# Patient Record
Sex: Female | Born: 1940 | Race: White | Hispanic: No | Marital: Married | Smoking: Former smoker
Health system: Southern US, Community
[De-identification: ages and names within clinical notes are randomized; demographics above are authoritative.]

## PROBLEM LIST (undated history)

## (undated) DIAGNOSIS — F419 Anxiety disorder, unspecified: Secondary | ICD-10-CM

## (undated) DIAGNOSIS — M810 Age-related osteoporosis without current pathological fracture: Secondary | ICD-10-CM

## (undated) DIAGNOSIS — K635 Polyp of colon: Secondary | ICD-10-CM

## (undated) DIAGNOSIS — C801 Malignant (primary) neoplasm, unspecified: Secondary | ICD-10-CM

## (undated) DIAGNOSIS — C73 Malignant neoplasm of thyroid gland: Secondary | ICD-10-CM

## (undated) DIAGNOSIS — C3490 Malignant neoplasm of unspecified part of unspecified bronchus or lung: Secondary | ICD-10-CM

## (undated) DIAGNOSIS — Z460 Encounter for fitting and adjustment of spectacles and contact lenses: Secondary | ICD-10-CM

## (undated) DIAGNOSIS — C50919 Malignant neoplasm of unspecified site of unspecified female breast: Secondary | ICD-10-CM

## (undated) HISTORY — PX: ABDOMINAL HYSTERECTOMY: SHX81

## (undated) HISTORY — DX: Malignant neoplasm of unspecified part of unspecified bronchus or lung: C34.90

## (undated) HISTORY — PX: TONSILLECTOMY AND ADENOIDECTOMY: SUR1326

## (undated) HISTORY — DX: Malignant (primary) neoplasm, unspecified: C80.1

## (undated) HISTORY — DX: Malignant neoplasm of thyroid gland: C73

## (undated) HISTORY — DX: Malignant neoplasm of unspecified site of unspecified female breast: C50.919

## (undated) HISTORY — DX: Encounter for fitting and adjustment of spectacles and contact lenses: Z46.0

## (undated) HISTORY — DX: Polyp of colon: K63.5

## (undated) HISTORY — DX: Age-related osteoporosis without current pathological fracture: M81.0

## (undated) HISTORY — PX: LUNG SURGERY: SHX703

## (undated) HISTORY — DX: Anxiety disorder, unspecified: F41.9

---

## 1969-07-31 HISTORY — PX: FOOT SURGERY: SHX648

## 1987-11-01 HISTORY — PX: ABDOMINAL SURGERY: SHX537

## 1997-10-31 DIAGNOSIS — C50919 Malignant neoplasm of unspecified site of unspecified female breast: Secondary | ICD-10-CM

## 1997-10-31 HISTORY — PX: BREAST SURGERY: SHX581

## 1997-10-31 HISTORY — DX: Malignant neoplasm of unspecified site of unspecified female breast: C50.919

## 1998-01-29 ENCOUNTER — Encounter: Admission: RE | Admit: 1998-01-29 | Discharge: 1998-04-29 | Payer: Self-pay | Admitting: Radiation Oncology

## 1998-05-28 ENCOUNTER — Ambulatory Visit (HOSPITAL_COMMUNITY): Admission: RE | Admit: 1998-05-28 | Discharge: 1998-05-28 | Payer: Self-pay | Admitting: Endocrinology

## 1998-08-31 ENCOUNTER — Ambulatory Visit (HOSPITAL_COMMUNITY): Admission: RE | Admit: 1998-08-31 | Discharge: 1998-08-31 | Payer: Self-pay | Admitting: Family Medicine

## 1998-08-31 ENCOUNTER — Encounter: Payer: Self-pay | Admitting: Family Medicine

## 2001-04-30 ENCOUNTER — Encounter (HOSPITAL_COMMUNITY): Admission: RE | Admit: 2001-04-30 | Discharge: 2001-05-30 | Payer: Self-pay | Admitting: Oncology

## 2001-04-30 ENCOUNTER — Encounter: Admission: RE | Admit: 2001-04-30 | Discharge: 2001-04-30 | Payer: Self-pay | Admitting: Oncology

## 2001-10-01 ENCOUNTER — Other Ambulatory Visit: Admission: RE | Admit: 2001-10-01 | Discharge: 2001-10-01 | Payer: Self-pay | Admitting: Obstetrics and Gynecology

## 2002-06-14 ENCOUNTER — Encounter: Admission: RE | Admit: 2002-06-14 | Discharge: 2002-06-14 | Payer: Self-pay | Admitting: Oncology

## 2002-06-14 ENCOUNTER — Encounter (HOSPITAL_COMMUNITY): Admission: RE | Admit: 2002-06-14 | Discharge: 2002-07-14 | Payer: Self-pay | Admitting: Oncology

## 2002-10-18 ENCOUNTER — Other Ambulatory Visit: Admission: RE | Admit: 2002-10-18 | Discharge: 2002-10-18 | Payer: Self-pay | Admitting: Gynecology

## 2002-12-09 ENCOUNTER — Ambulatory Visit (HOSPITAL_COMMUNITY): Admission: RE | Admit: 2002-12-09 | Discharge: 2002-12-09 | Payer: Self-pay | Admitting: Gastroenterology

## 2002-12-09 ENCOUNTER — Encounter (INDEPENDENT_AMBULATORY_CARE_PROVIDER_SITE_OTHER): Payer: Self-pay | Admitting: Specialist

## 2003-01-17 ENCOUNTER — Ambulatory Visit (HOSPITAL_COMMUNITY): Admission: RE | Admit: 2003-01-17 | Discharge: 2003-01-17 | Payer: Self-pay | Admitting: Gastroenterology

## 2003-04-10 ENCOUNTER — Other Ambulatory Visit: Admission: RE | Admit: 2003-04-10 | Discharge: 2003-04-10 | Payer: Self-pay | Admitting: Gynecology

## 2003-07-01 ENCOUNTER — Encounter: Admission: RE | Admit: 2003-07-01 | Discharge: 2003-07-01 | Payer: Self-pay | Admitting: Oncology

## 2003-07-01 ENCOUNTER — Encounter (HOSPITAL_COMMUNITY): Admission: RE | Admit: 2003-07-01 | Discharge: 2003-07-31 | Payer: Self-pay | Admitting: Oncology

## 2003-09-08 ENCOUNTER — Ambulatory Visit (HOSPITAL_COMMUNITY): Admission: RE | Admit: 2003-09-08 | Discharge: 2003-09-08 | Payer: Self-pay | Admitting: Gastroenterology

## 2003-09-08 ENCOUNTER — Encounter (INDEPENDENT_AMBULATORY_CARE_PROVIDER_SITE_OTHER): Payer: Self-pay

## 2004-02-24 ENCOUNTER — Encounter: Admission: RE | Admit: 2004-02-24 | Discharge: 2004-02-24 | Payer: Self-pay | Admitting: Endocrinology

## 2004-04-20 ENCOUNTER — Other Ambulatory Visit: Admission: RE | Admit: 2004-04-20 | Discharge: 2004-04-20 | Payer: Self-pay | Admitting: Gynecology

## 2004-07-12 ENCOUNTER — Encounter: Admission: RE | Admit: 2004-07-12 | Discharge: 2004-07-30 | Payer: Self-pay | Admitting: Oncology

## 2004-07-12 ENCOUNTER — Encounter (HOSPITAL_COMMUNITY): Admission: RE | Admit: 2004-07-12 | Discharge: 2004-07-30 | Payer: Self-pay | Admitting: Oncology

## 2005-01-17 ENCOUNTER — Encounter (INDEPENDENT_AMBULATORY_CARE_PROVIDER_SITE_OTHER): Payer: Self-pay | Admitting: *Deleted

## 2005-01-17 ENCOUNTER — Ambulatory Visit (HOSPITAL_COMMUNITY): Admission: RE | Admit: 2005-01-17 | Discharge: 2005-01-17 | Payer: Self-pay | Admitting: Gastroenterology

## 2005-05-27 ENCOUNTER — Other Ambulatory Visit: Admission: RE | Admit: 2005-05-27 | Discharge: 2005-05-27 | Payer: Self-pay | Admitting: Gynecology

## 2005-06-01 ENCOUNTER — Encounter: Admission: RE | Admit: 2005-06-01 | Discharge: 2005-06-01 | Payer: Self-pay | Admitting: Gynecology

## 2005-07-15 ENCOUNTER — Ambulatory Visit (HOSPITAL_COMMUNITY): Payer: Self-pay | Admitting: Oncology

## 2005-07-15 ENCOUNTER — Encounter: Admission: RE | Admit: 2005-07-15 | Discharge: 2005-07-30 | Payer: Self-pay | Admitting: Oncology

## 2005-07-15 ENCOUNTER — Encounter (HOSPITAL_COMMUNITY): Admission: RE | Admit: 2005-07-15 | Discharge: 2005-07-30 | Payer: Self-pay | Admitting: Oncology

## 2005-12-12 ENCOUNTER — Encounter (INDEPENDENT_AMBULATORY_CARE_PROVIDER_SITE_OTHER): Payer: Self-pay | Admitting: Specialist

## 2005-12-12 ENCOUNTER — Ambulatory Visit (HOSPITAL_COMMUNITY): Admission: RE | Admit: 2005-12-12 | Discharge: 2005-12-12 | Payer: Self-pay | Admitting: Gastroenterology

## 2006-04-17 ENCOUNTER — Encounter (INDEPENDENT_AMBULATORY_CARE_PROVIDER_SITE_OTHER): Payer: Self-pay | Admitting: *Deleted

## 2006-04-17 ENCOUNTER — Ambulatory Visit (HOSPITAL_COMMUNITY): Admission: RE | Admit: 2006-04-17 | Discharge: 2006-04-17 | Payer: Self-pay | Admitting: Gastroenterology

## 2006-06-09 ENCOUNTER — Other Ambulatory Visit: Admission: RE | Admit: 2006-06-09 | Discharge: 2006-06-09 | Payer: Self-pay | Admitting: Gynecology

## 2006-09-29 ENCOUNTER — Encounter (HOSPITAL_COMMUNITY): Admission: RE | Admit: 2006-09-29 | Discharge: 2006-10-29 | Payer: Self-pay | Admitting: Oncology

## 2006-09-29 ENCOUNTER — Ambulatory Visit (HOSPITAL_COMMUNITY): Payer: Self-pay | Admitting: Oncology

## 2006-10-31 HISTORY — PX: MELANOMA EXCISION: SHX5266

## 2007-05-28 ENCOUNTER — Ambulatory Visit (HOSPITAL_COMMUNITY): Admission: RE | Admit: 2007-05-28 | Discharge: 2007-05-28 | Payer: Self-pay | Admitting: Gastroenterology

## 2007-06-22 ENCOUNTER — Other Ambulatory Visit: Admission: RE | Admit: 2007-06-22 | Discharge: 2007-06-22 | Payer: Self-pay | Admitting: Gynecology

## 2007-07-20 ENCOUNTER — Ambulatory Visit (HOSPITAL_COMMUNITY): Admission: RE | Admit: 2007-07-20 | Discharge: 2007-07-20 | Payer: Self-pay | Admitting: Endocrinology

## 2007-10-05 ENCOUNTER — Encounter (HOSPITAL_COMMUNITY): Admission: RE | Admit: 2007-10-05 | Discharge: 2007-10-31 | Payer: Self-pay | Admitting: Oncology

## 2007-10-05 ENCOUNTER — Ambulatory Visit (HOSPITAL_COMMUNITY): Payer: Self-pay | Admitting: Oncology

## 2007-11-01 HISTORY — PX: CATARACT EXTRACTION W/ INTRAOCULAR LENS IMPLANT: SHX1309

## 2008-06-27 ENCOUNTER — Other Ambulatory Visit: Admission: RE | Admit: 2008-06-27 | Discharge: 2008-06-27 | Payer: Self-pay | Admitting: Gynecology

## 2008-07-25 ENCOUNTER — Ambulatory Visit (HOSPITAL_COMMUNITY): Admission: RE | Admit: 2008-07-25 | Discharge: 2008-07-25 | Payer: Self-pay | Admitting: Endocrinology

## 2008-10-03 ENCOUNTER — Ambulatory Visit (HOSPITAL_COMMUNITY): Payer: Self-pay | Admitting: Oncology

## 2008-10-09 ENCOUNTER — Ambulatory Visit: Payer: Self-pay | Admitting: *Deleted

## 2009-05-29 ENCOUNTER — Ambulatory Visit (HOSPITAL_COMMUNITY): Admission: RE | Admit: 2009-05-29 | Discharge: 2009-05-29 | Payer: Self-pay | Admitting: Surgery

## 2009-06-02 ENCOUNTER — Inpatient Hospital Stay (HOSPITAL_COMMUNITY): Admission: RE | Admit: 2009-06-02 | Discharge: 2009-06-06 | Payer: Self-pay | Admitting: Surgery

## 2009-06-02 ENCOUNTER — Encounter (INDEPENDENT_AMBULATORY_CARE_PROVIDER_SITE_OTHER): Payer: Self-pay | Admitting: Surgery

## 2009-06-02 HISTORY — PX: RIGHT COLECTOMY: SHX853

## 2009-07-02 ENCOUNTER — Encounter: Payer: Self-pay | Admitting: Gynecology

## 2009-07-02 ENCOUNTER — Ambulatory Visit: Payer: Self-pay | Admitting: Gynecology

## 2009-07-02 ENCOUNTER — Other Ambulatory Visit: Admission: RE | Admit: 2009-07-02 | Discharge: 2009-07-02 | Payer: Self-pay | Admitting: Gynecology

## 2009-07-27 ENCOUNTER — Ambulatory Visit (HOSPITAL_COMMUNITY): Admission: RE | Admit: 2009-07-27 | Discharge: 2009-07-27 | Payer: Self-pay | Admitting: Endocrinology

## 2009-10-31 DIAGNOSIS — C73 Malignant neoplasm of thyroid gland: Secondary | ICD-10-CM

## 2009-10-31 HISTORY — DX: Malignant neoplasm of thyroid gland: C73

## 2009-11-06 ENCOUNTER — Ambulatory Visit (HOSPITAL_COMMUNITY): Admission: RE | Admit: 2009-11-06 | Discharge: 2009-11-07 | Payer: Self-pay | Admitting: Surgery

## 2009-11-06 ENCOUNTER — Encounter (INDEPENDENT_AMBULATORY_CARE_PROVIDER_SITE_OTHER): Payer: Self-pay | Admitting: Surgery

## 2009-11-06 HISTORY — PX: THYROIDECTOMY: SHX17

## 2010-07-29 ENCOUNTER — Ambulatory Visit: Payer: Self-pay | Admitting: Gynecology

## 2010-07-29 ENCOUNTER — Other Ambulatory Visit: Admission: RE | Admit: 2010-07-29 | Discharge: 2010-07-29 | Payer: Self-pay | Admitting: Gynecology

## 2010-08-04 ENCOUNTER — Ambulatory Visit: Payer: Self-pay | Admitting: Gynecology

## 2011-01-06 LAB — HM MAMMOGRAPHY

## 2011-01-11 ENCOUNTER — Encounter: Payer: Self-pay | Admitting: Family Medicine

## 2011-01-16 LAB — DIFFERENTIAL
Eosinophils Absolute: 0.2 10*3/uL (ref 0.0–0.7)
Lymphocytes Relative: 26 % (ref 12–46)
Lymphs Abs: 1.2 10*3/uL (ref 0.7–4.0)
Monocytes Relative: 13 % — ABNORMAL HIGH (ref 3–12)
Neutro Abs: 2.6 10*3/uL (ref 1.7–7.7)
Neutrophils Relative %: 57 % (ref 43–77)

## 2011-01-16 LAB — CBC
MCV: 86.1 fL (ref 78.0–100.0)
Platelets: 172 10*3/uL (ref 150–400)
RBC: 4.85 MIL/uL (ref 3.87–5.11)
WBC: 4.5 10*3/uL (ref 4.0–10.5)

## 2011-01-16 LAB — PROTIME-INR
INR: 1.01 (ref 0.00–1.49)
Prothrombin Time: 13.2 seconds (ref 11.6–15.2)

## 2011-01-16 LAB — URINALYSIS, ROUTINE W REFLEX MICROSCOPIC
Bilirubin Urine: NEGATIVE
Nitrite: NEGATIVE
Specific Gravity, Urine: 1.017 (ref 1.005–1.030)
Urobilinogen, UA: 0.2 mg/dL (ref 0.0–1.0)

## 2011-01-16 LAB — URINE MICROSCOPIC-ADD ON

## 2011-01-16 LAB — COMPREHENSIVE METABOLIC PANEL
Alkaline Phosphatase: 59 U/L (ref 39–117)
BUN: 14 mg/dL (ref 6–23)
Chloride: 107 mEq/L (ref 96–112)
Creatinine, Ser: 0.85 mg/dL (ref 0.4–1.2)
Glucose, Bld: 84 mg/dL (ref 70–99)
Potassium: 6 mEq/L — ABNORMAL HIGH (ref 3.5–5.1)
Total Bilirubin: 1.2 mg/dL (ref 0.3–1.2)
Total Protein: 6.5 g/dL (ref 6.0–8.3)

## 2011-01-16 LAB — POTASSIUM: Potassium: 4.9 mEq/L (ref 3.5–5.1)

## 2011-01-16 LAB — CALCIUM: Calcium: 8.3 mg/dL — ABNORMAL LOW (ref 8.4–10.5)

## 2011-02-05 LAB — CBC
HCT: 39.8 % (ref 36.0–46.0)
Hemoglobin: 14 g/dL (ref 12.0–15.0)
MCHC: 35.2 g/dL (ref 30.0–36.0)
MCV: 86.4 fL (ref 78.0–100.0)
RDW: 13 % (ref 11.5–15.5)

## 2011-02-05 LAB — CLOSTRIDIUM DIFFICILE EIA
C difficile Toxins A+B, EIA: NEGATIVE
C difficile Toxins A+B, EIA: NEGATIVE

## 2011-02-05 LAB — BASIC METABOLIC PANEL
CO2: 28 mEq/L (ref 19–32)
Chloride: 106 mEq/L (ref 96–112)
Glucose, Bld: 113 mg/dL — ABNORMAL HIGH (ref 70–99)
Potassium: 3.7 mEq/L (ref 3.5–5.1)
Sodium: 140 mEq/L (ref 135–145)

## 2011-02-06 LAB — DIFFERENTIAL
Eosinophils Relative: 2 % (ref 0–5)
Lymphocytes Relative: 20 % (ref 12–46)
Lymphs Abs: 1.1 10*3/uL (ref 0.7–4.0)
Monocytes Absolute: 0.5 10*3/uL (ref 0.1–1.0)
Monocytes Relative: 9 % (ref 3–12)

## 2011-02-06 LAB — URINALYSIS, ROUTINE W REFLEX MICROSCOPIC
Hgb urine dipstick: NEGATIVE
Protein, ur: NEGATIVE mg/dL
Urobilinogen, UA: 0.2 mg/dL (ref 0.0–1.0)

## 2011-02-06 LAB — COMPREHENSIVE METABOLIC PANEL
AST: 25 U/L (ref 0–37)
Albumin: 3.9 g/dL (ref 3.5–5.2)
Calcium: 9.5 mg/dL (ref 8.4–10.5)
Chloride: 107 mEq/L (ref 96–112)
Creatinine, Ser: 0.81 mg/dL (ref 0.4–1.2)
GFR calc Af Amer: >60 mL/min (ref >60)
Total Bilirubin: 0.8 mg/dL (ref 0.3–1.2)
Total Protein: 6.3 g/dL (ref 6.0–8.3)

## 2011-02-06 LAB — CBC
MCV: 87.9 fL (ref 78.0–100.0)
Platelets: 174 10*3/uL (ref 150–400)
WBC: 5.7 10*3/uL (ref 4.0–10.5)

## 2011-03-15 NOTE — Procedures (Signed)
DUPLEX ULTRASOUND OF ABDOMINAL AORTA   INDICATION:  Pulsatile mass was found during recent doctor's visit.   HISTORY:  Diabetes:  No.  Cardiac:  No.  Hypertension:  No.  Smoking:  Former smoker.  Connective Tissue Disorder:  Family History:  No.  Previous Surgery:   DUPLEX EXAM:         AP (cm)                   TRANSVERSE (cm)  Proximal             2.0 cm                    2.2 cm  Mid                  1.8 cm                    1.9 cm  Distal               1.4 cm                    1.4 cm  Right Iliac          0.8 cm                    0.7 cm  Left Iliac           0.7 cm                    0.6 cm   PREVIOUS:  Date:  AP:  TRANSVERSE:   IMPRESSION:  No evidence of significant aortic or common iliac artery  dilation.   ___________________________________________  P. Liliane Bade, M.D.   MC/MEDQ  D:  10/09/2008  T:  10/09/2008  Job:  [id number]

## 2011-03-15 NOTE — Op Note (Signed)
[name], [name] NO.:  [id number]   MEDICAL RECORD NO.:  [id number]          PATIENT TYPE:  INP   LOCATION:  1527                         FACILITY:  [facility]   PHYSICIAN:  Currie Paris, M.D.DATE OF BIRTH:  Feb 24, 1941   DATE OF PROCEDURE:  06/02/2009  DATE OF DISCHARGE:                               OPERATIVE REPORT   PREOPERATIVE DIAGNOSIS:  Cecal polyp.   POSTOPERATIVE DIAGNOSIS:  Cecal polyp.   OPERATION:  Resection of terminal ileum and cecum with primary  anastomosis.   SURGEON:  Currie Paris, MD   ASSISTANT:  Velora Heckler, MD   ANESTHESIA:  General.   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  It has been removed but because  of the thin tissue that has tentatively reoccurred and at this point was  thought to be a candidate for resection.  Prior biopsies had always been  benign.   DESCRIPTION OF PROCEDURE:  I saw the patient in the holding area and she  had no further questions.  We confirmed the plans as noted above.   The patient was taken to the operating room after a satisfactory general  endotracheal anesthesia had been obtained, a Foley catheter was placed,  and the abdomen was prepped and draped.  The time-out was done.   I placed a 5-mm trocar in the left upper quadrant and entered the  abdomen easily.  There were a few midline adhesions and a few adhesions  of the cecum to the right side.  Under direct vision, I put a 5-mm  trocar in the left lower quadrant and using scissors was able to take  the midline adhesions down.   At this point, I could tell that the cecum was going to be fairly mobile  and her prior appendectomy did not appear to have her terminal ileum  tethered whatsoever.  I thought we would be able to get good mobility  for a limited cecal resection just by mobilizing the ascending colon.   It looked as if a short transverse incision just  below the umbilicus  would be the most appropriate for extraction of the specimen, so I put a  10-mm port there.   I then used the harmonic and mobilized the entire right colon up to the  hepatic flexure and a little bit of the terminal ileum.  This was done  with minimal blood loss.  We had excellent mobility and I could easily  bring the ileocecal valve across the midline.   I then removed the 10-11 trocar and made a transverse incision.  I  divided skin and subcutaneous tissue and fascia of muscle with cautery.  A wound protector was placed.   The ileocecal valve area was pulled out and we had excellent mobility  with lot of the ascending colon out of the abdomen as well as the  terminal ileum.  I elected to take just a very little bit of terminal  ileum as the patient already has had problems with  diarrhea and I did  not want to resect too much of the ileum.  I also could palpate the  polyp in the ileocecal valve area and went several centimeters distal to  that.   I divided the intervening mesentery between clamps and ligated with 2-0  silk.  I then lined up the antimesenteric border of small bowel and  ascending colon and tacked them together.  I opened both in the area to  be resected and put the linear stapler down and fired that.  The staple  line appeared dry.   Using the TA-60 stapler, I then came across all the layers and fired  that and then cut off the specimen.  A couple of bleeding points in the  staple line were sutured with 3-0 silk.   The anastomosis was widely patent.  There was no tension.  I put a  crotch stitch in and then closed the mesentery with some 3-0 silks.  I  irrigated and then replaced the anastomotic area back into the abdomen.  I made a final irrigation and everything appeared to be dry.   The wound protector was removed.  The posterior sheath was closed with a  0-PDS and the anterior with running #1 PDS.  The subcu was irrigated and  all skin  incisions closed with staples.   The patient tolerated the procedure well and there were no operative  complications.  All counts were correct.      Currie Paris, M.D.  Electronically Signed     CJS/MEDQ  D:  06/02/2009  T:  06/03/2009  Job:  [id number]   cc:   Shannon [name], M.D.  James L. Malon Kindle., M.D.

## 2011-03-15 NOTE — Op Note (Signed)
Shannon [name], Shannon [name]                 ACCOUNT NO.:  [id number]   MEDICAL RECORD NO.:  [id number]          PATIENT TYPE:  AMB   LOCATION:  ENDO                         FACILITY:  [facility]   PHYSICIAN:  James L. Malon Kindle., M.D.DATE OF BIRTH:  December 20, 1940   DATE OF PROCEDURE:  05/28/2007  DATE OF DISCHARGE:                               OPERATIVE REPORT   PROCEDURE:  Colonoscopy.   MEDICATIONS:  Fentanyl 100 mcg, Versed 10 mg IV.   INDICATION:  A nice 71 year old woman who has had recent colonoscopies  with a polyp on the ileocecal valve.  We were unable to get this  completely removed, and 1 year ago, she underwent a second area of  fulguration with a very small amount of residual polyp with an argon  coagulator.  This is done as a 1-year follow-up to make sure that the  area has been completely obliterated.   DESCRIPTION OF PROCEDURE:  Procedure had been explained to the patient  and consent obtained in left lateral decubitus position.  The Pentax  adult scope was inserted and advanced.  The prep was adequate.  Using  abdominal pressure and position changes, we were able to reach the  cecum, and ileocecal valve and appendiceal orifice were seen.  The  ileocecal valve was carefully examined.  There was no signs of any  residual polyp there.  The colon was carefully examined upon removal.  There were no further polyps seen.  There were some internal  hemorrhoids.  The patient tolerated the procedure well and was  maintained on low-flow oxygen and pulse oximeter throughout the  procedure.   ASSESSMENT:  History of colon polyp on the ileocecal valve.  At this  point, no further polyps.  This appears to be completely obliterated by  previous fulguration.   PLAN:  Will recommend repeating colonoscopy in 3 years.  Will resume all  of her other medications.           ______________________________  Llana Aliment Malon Kindle., M.D.     Waldron Session  D:  05/28/2007  T:  05/28/2007  Job:   [id number]   cc:   Ladona Horns. Mariel Sleet, MD  Fax: [phone]   Marjory Lies, M.D.  Fax: [phone]   Llana Aliment. Malon Kindle., M.D.  Fax: [phone]

## 2011-03-18 NOTE — Op Note (Signed)
NAMESEDONIA, Shannon [name]                 ACCOUNT NO.:  [id number]   MEDICAL RECORD NO.:  [id number]          PATIENT TYPE:  AMB   LOCATION:  ENDO                         FACILITY:  [facility]   PHYSICIAN:  James L. Malon Kindle., M.D.DATE OF BIRTH:  10/25/1941   DATE OF PROCEDURE:  04/17/2006  DATE OF DISCHARGE:                                 OPERATIVE REPORT   PROCEDURE PERFORMED:  Colonoscopy with polypectomy, fulguration with argon  plasma coagulator.   ENDOSCOPIST:  Llana Aliment. Randa Evens, M.D.   MEDICATIONS:  Fentanyl 100 mcg, Versed 10 mg IV, Phenergan 12.5 mg IV.   INSTRUMENT USED:  Olympus pediatric adjustable colonoscope.   INDICATIONS FOR PROCEDURE:  Patient has had colonoscopy with polyp on the  ileocecal valve.  We shaved it down and fulgurated the area with the argon  plasma coagulator back in February.  This is done as follow-up.   DESCRIPTION OF PROCEDURE:  The procedure had been explained to the patient  and consent obtained.  The plan was to fulgurate any residual polyp with the  argon coagulator.  The prep was excellent and we were able to advance fairly  easily to the cecum.  The ileocecal valve and appendiceal orifice were seen.  There was residual polyp right at the right edge of the ileocecal valve in  the left lateral decubitus position.  It was fairly minimal.  Several pieces  were shaved down.  The whole area was fulgurated again with argon plasma  coagulator.  There was a good coagulum at that site.  The scope was  withdrawn and no other polyps were seen in the colon upon removal.  No  diverticular disease.  The scope was withdrawn.  The patient tolerated the  procedure well.   ASSESSMENT:  Residual polyp on the ileocecal valve minimal in nature, snared  by snare polypectomy and base was fulgurated with argon coagulator.  At this  point I feel that this will probably remove any residual polyp.   PLAN:  Will repeat procedure in one year with APC on standby.     ______________________________  Llana Aliment. Malon Kindle., M.D.     Waldron Session  D:  04/17/2006  T:  04/18/2006  Job:  [id number]   cc:   Ladona Horns. Mariel Sleet, MD  Fax: [phone]   Teena Irani. Arlyce Dice, M.D.  Fax: [phone]

## 2011-03-18 NOTE — Op Note (Signed)
   NAME:  Shannon [name], Shannon [name]                           ACCOUNT NO.:  [id number]   MEDICAL RECORD NO.:  [id number]                   PATIENT TYPE:  AMB   LOCATION:  ENDO                                 FACILITY:  [facility]   PHYSICIAN:  James L. Malon Kindle., M.D.          DATE OF BIRTH:  03-Apr-1941   DATE OF PROCEDURE:  12/09/2002  DATE OF DISCHARGE:                                 OPERATIVE REPORT   PROCEDURE:  Colonoscopy with polypectomy and biopsy.   MEDICATIONS:  Fentanyl 150 mcg, Versed 10 mg IV.   EQUIPMENT:  Scope:  Adult scope switching over to pediatric adjustable.   INDICATIONS:  Colon cancer screening in a woman with a strong history of  breast cancer.   DESCRIPTION OF PROCEDURE:  The procedure was started with the adult scope.  The pediatric was not available.  We were unable to pass the splenic flexure  so I withdrew and inserted the pediatric adjustable colonoscope, were able  to easily pass to the cecum.  The ileocecal valve and appendiceal orifice.  There was what appeared to be a villous type polyp. I went into the  ileocecal valve and this was normal for about 5 or 6 cm.  This was right on  top.  There were several prominent areas which I shaved down with a snare  and then took multiple biopsies.  It was not clear if this was small bowel  mucosa going out and over the ileocecal valve or true clonic adenoma. The  scope was withdrawn and the colon carefully examined.  The remainder of the  colon was free of polyps or any other lesions.  The patient tolerated the  procedure well, was maintained on low-flow oxygen and pulse oximetry  throughout the procedure.   ASSESSMENT:  Possible polyp on the ileocecal valve versus overgrowth of  small bowel mucosa.   PLAN:  We will check pathology and make further recommendations then.                                                 James L. Malon Kindle., M.D.    Waldron Session  D:  12/09/2002  T:  12/09/2002  Job:  [id number]   cc:    Ladona Horns. Neijstrom, M.D.  [address]  [place]  [place] [address]  Fax: [phone]   Teena Irani. Arlyce Dice, M.D.  [address]  [place]  [place] [address]  Fax: [phone]   Tera Mater. Evlyn Kanner, M.D.  [address]  [place]  [place] [address]  Fax: [phone]

## 2011-03-18 NOTE — Discharge Summary (Signed)
Shannon, [name] NO.:  [id number]   MEDICAL RECORD NO.:  [id number]          PATIENT TYPE:  INP   LOCATION:  1527                         [facility]ACILITY:  [facility]   PHYSICIAN:  Currie Paris, M.D.DATE OF BIRTH:  15-Oct-1941   DATE OF ADMISSION:  06/02/2009  DATE OF DISCHARGE:  06/06/2009                               DISCHARGE SUMMARY   VISIT NUMBER:  [id number].   FINAL DIAGNOSIS:  A sessile tubovillous adenoma of cecum.   MEDICAL HISTORY:  [name] is a 70 year old lady who has a persistent  polyp of the cecal area near the ileocecal valve.  It has been resected  a couple of times by Dr. Randa Evens but continues to recur locally and Dr.  Randa Evens felt that he could not get a deeper, more adequate margin  because of the thin-wall of the cecum.  Therefore, resection was  recommended.   HOSPITAL COURSE:  The patient was admitted and taken to the operating  room on August 3, where a partial right colectomy was done.  It was a  laparoscopic-assisted procedure.  A polyp was noted on gross exam of the  specimen.   The patient had a benign postoperative course.  She was able to start on  some sips of liquids.  She had a few loose stools, but she has had this  previously at home prior to surgery.  By August 7, she was ready for  discharge.  She is to come back to the office for staple removal.  She  was to restart her usual home medications.   Laboratory studies included a hemoglobin of 15.4 preoperatively, 14  after surgery.  Electrolytes were basically normal.  Urinalysis had been  negative.  She had two Clostridium difficile toxin checks for the loose  stools but they were negative.  She is noted to be B+ blood type. Final  pathology confirmed a benign adenoma.      Currie Paris, M.D.  Electronically Signed     CJS/MEDQ  D:  06/10/2009  T:  06/10/2009  Job:  [id number]   cc:   Fayrene Fearing L. Malon Kindle., M.D.  Fax: [phone]

## 2011-03-18 NOTE — Op Note (Signed)
NAME:  Shannon [name], HOHMANN                           ACCOUNT NO.:  [id number]   MEDICAL RECORD NO.:  [id number]                   PATIENT TYPE:  AMB   LOCATION:  ENDO                                 [facility]ACILITY:  [facility]   PHYSICIAN:  James L. Malon Kindle., M.D.          DATE OF BIRTH:  21-Aug-1941   DATE OF PROCEDURE:  09/08/2003  DATE OF DISCHARGE:                                 OPERATIVE REPORT   PROCEDURE:  Colonoscopy with polypectomy.   MEDICATIONS:  Fentanyl 75 mcg, Versed 8.5 mg IV.   INDICATIONS:  The patient had a villous adenoma of the cecum found that was  biopsied, showing a high-grade dysplasia.  We came back and using the  polypectomy snare and the ERBE argon plasma coagulator fulgurated this area  in the cecum.  It actually was approximately 2 cm and, it rather than being  in the cecum, was right on top of the side of the ileocecal valve.  This was  done as a followup six months later to remove any recurrence.   DESCRIPTION OF PROCEDURE:  The procedure had been explained to the patient  and consent obtained.  With the patient in the left lateral decubitus  position, the scope was inserted and advanced.  The patient had a long,  tortuous colon.  Multiple maneuvers, including abdominal pressure and  position changes, were required, but we eventually were able to reach the  cecum.  The ileocecal valve and appendiceal orifice were seen.  Right on the  top of the ileocecal valve was a small polypoid area.  It was estimated to  be 5% or less of what it had looked like previously.  There were a couple of  small satellite areas.  These were hot biopsied and the 0.5 cm sessile polyp  area was removed with a snare and sucked through the scope.  There was no  residual polyp obviously seen following this.  The scope was then withdrawn.  The patient tolerated the procedure well.  No other polyps were seen in the  ascending colon, transverse, descending, or sigmoid colon on  withdrawal.    ASSESSMENT:  A small amount of residual polyp left on the ileocecal valve  and the right colon, was removed with the snare.   PLAN:  Routine postpolypectomy instructions.  Will recommend repeating the  procedure in one year.                                               James L. Malon Kindle., M.D.    Waldron Session  D:  09/08/2003  T:  09/08/2003  Job:  [id number]   cc:   Teena Irani. Arlyce Dice, M.D.  [address]  [place]  [place] [address]  Fax: [phone]   Ladona Horns. Neijstrom, MD  [id number]  [address]  [place]  [place] [address]  Fax: [phone]

## 2011-03-18 NOTE — Op Note (Signed)
NAMEJULL, HARRAL                 ACCOUNT NO.:  [id number]   MEDICAL RECORD NO.:  [id number]          PATIENT TYPE:  AMB   LOCATION:  ENDO                         FACILITY:  [facility]   PHYSICIAN:  James L. Malon Kindle., M.D.DATE OF BIRTH:  Sep 18, 1941   DATE OF PROCEDURE:  01/17/2005  DATE OF DISCHARGE:                                 OPERATIVE REPORT   PROCEDURE:  Colonoscopy with fulguration of polyp with argon plasma  coagulator.   MEDICATIONS:  Fentanyl 75 mcg, Versed 8 mg IV.   INDICATIONS:  Patient had a villous adenoma with high-grade dysplasia.  Using polypectomy, snare, and argon coagulator, it was shaved down.  Six-  month followup again showed some slight residual tissue on top of the  ileocecal valve.  This was removed with the snare.  This is done as a one-  year followup.   DESCRIPTION OF PROCEDURE:  The procedure has been explained to the patient  and consent obtained.  With the patient in the left lateral decubitus  position, the Olympus pediatric adjustable scope was inserted and advanced.  The prep was quite good.  Using abdominal pressure and position changes, we  were able to advance down to the cecum with the patient on her back.  While  on top of the ileocecal valve, there was recurrent cobblestoning of villous  adenoma.  It was estimated to be about 1.5 cm in diameter.  This appeared  poorly defined.  Several biopsies were obtained, and the whole area was  fulgurated with the argon plasma coagulator.  At the termination of the  procedure, there was good coagulum with no obvious residual polyp.  The  scope was withdrawn.  No other polyps were seen in the ascending,  transverse, and descending sigmoid colon.  There was no diverticular  disease.  The rectum was free of polyps.  The scope was withdrawn.  The  patient tolerated the procedure well.   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   211.3.   PLAN:  Will check path.  Will recommend repeating in six months.  Routine  post-polypectomy instructions.      JLE/MEDQ  D:  01/17/2005  T:  01/17/2005  Job:  [id number]   cc:   Teena Irani. Arlyce Dice, M.D.  [address]  [place]  [place] [address]  Fax: [phone]   Ladona Horns. Neijstrom, MD  [address]  [place]  [place] [address]  Fax: [phone]

## 2011-03-18 NOTE — Op Note (Signed)
NAME:  Shannon [name], Shannon [name]                           ACCOUNT NO.:  [id number]   MEDICAL RECORD NO.:  [id number]                   PATIENT TYPE:  AMB   LOCATION:  ENDO                                 FACILITY:  [facility]   PHYSICIAN:  James L. Malon Kindle., M.D.          DATE OF BIRTH:  Sep 01, 1941   DATE OF PROCEDURE:  DATE OF DISCHARGE:                                 OPERATIVE REPORT   PROCEDURE:  Colonoscopy with fulguration of polyp.   MEDICATIONS:  Fentanyl 150 mcg and Versed 7.5 mg IV.   SCOPE:  Olympus adjustable pediatric colonoscope.   SURGEON:  James L. Randa Evens, M.D.   INDICATIONS FOR PROCEDURE:  This is done as a follow-up colonoscopy.  The  patient had a colonoscopy done over a month ago with the finding of a large  villous adenoma right on the ileocecal valve.  The area was shaved down with  snare polypectomy and was biopsied and came back actually showing this to be  a polyp.  The pathology returned showing this to be a villous polyp with no  high grade dysplasia.  We discussed options with the patient including the  potential of a right hemicolectomy versus trying to fulgurate this lesion  with the Argon coagulator and she wanted to try initially to fulgurate the  lesion with the Argon coagulator.   DESCRIPTION OF PROCEDURE:  The procedure had been explained to the patient  and consent was obtained.  With the patient in the left lateral decubitus  position the Olympus scope was inserted. The patient had a long tortuous  colon.  Multiple maneuvers were applied and eventually with the patient on  her right side we were able to advance down the cecum. The ileocecal valve  was clearly seen.  Right on top of the ileocecal valve was an area 2 to 3 cm  in diameter of cobblestone appearing material.  There was no gross polyp; it  was really very fine like a very small amount of residual villous adenoma.  We used the Argon plasma coagulator and the probe was inserted and the  entire  area was fulgurated.  There was no bleeding.  It was not apparent  that there was any residual tissue at the termination of the procedure.  The  scope was withdrawn and the colon carefully examined on withdrawal.  The  ascending colon, hepatic flexure, transverse colon, splenic flexure,  descending and sigmoid colon were seen well.  No further polyps were seen.  The patient tolerated the procedure well.   ASSESSMENT:  Successful fulguration of large villous adenoma remnant in the  cecum.   PLAN:  We will suggest routine post polypectomy instructions and we plan on  repeating the procedure in eight months.  James L. Malon Kindle., M.D.    Waldron Session  D:  01/17/2003  T:  01/18/2003  Job:  [id number]   cc:   Ladona Horns. Neijstrom, MD  [address]  [place]  [place] [address]  Fax: [phone]   Teena Irani. Arlyce Dice, M.D.  [address]  [place]  [place] [address]  Fax: [phone]   Tera Mater. Evlyn Kanner, M.D.  [address]  [place]  [place] [address]  Fax: [phone]

## 2011-03-18 NOTE — Op Note (Signed)
NAMEKARNA, ABED                 ACCOUNT NO.:  [id number]   MEDICAL RECORD NO.:  [id number]          PATIENT TYPE:  AMB   LOCATION:  ENDO                         FACILITY:  [facility]   PHYSICIAN:  James L. Malon Kindle., M.D.DATE OF BIRTH:  1941/01/25   DATE OF PROCEDURE:  04/17/2006  DATE OF DISCHARGE:                                 OPERATIVE REPORT   NO DICTATION.           ______________________________  Llana Aliment Malon Kindle., M.D.     Waldron Session  D:  04/17/2006  T:  04/18/2006  Job:  [id number]

## 2011-03-18 NOTE — Op Note (Signed)
Shannon [name], Shannon [name]                 ACCOUNT NO.:  [id number]   MEDICAL RECORD NO.:  [id number]          PATIENT TYPE:  AMB   LOCATION:  ENDO                         FACILITY:  [facility]   PHYSICIAN:  James L. Malon Kindle., M.D.DATE OF BIRTH:  1941-10-15   DATE OF PROCEDURE:  12/12/2005  DATE OF DISCHARGE:                                 OPERATIVE REPORT   PROCEDURE:  Colonoscopy with polypectomy and fulguration of polyp.   MEDICATIONS:  Fentanyl 75 mcg, Versed 10 mg IV.   SCOPE:  Pediatric adjustable scope.   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  A six month followup showed some  residual tissue that was removed with the snare eight months ago.  We went  ahead and shaved it down more and fulgurated it with the Argon coagulator.  The patient comes back to have this rechecked.   DESCRIPTION OF PROCEDURE:  The procedure has been explained to the patient  and consent obtained.  With the patient in the left lateral decubitus  position, the Olympus pediatric adjustable scope was inserted and advanced.  The patient had a long, tortuous colon.  Using some abdominal pressure, we  were able to advance over to the cecum, and the ileocecal valve and  appendiceal orifice seen.  Just on the ileocecal valve was a residual  villous adenoma crawling over the folds.  This was shaved down as much as  possible with a snare, and the piece is sucked through the scope.  We then  fulgurated the entire area again with the ERBE Argon plasma coagulator with  a good coagulum.  There was no bleeding at the site.  The size of this was  difficult to determine because it was bits and pieces draped around over the  folds on top of the ileocecal valve.  The scope was withdrawn.  The colon  was carefully examined.  No other polyps were seen.  The colon was  decompressed.   ASSESSMENT:  Cecal polyp, snared and fulgurated.  211.3.   PLAN:   Routine post polypectomy instructions.  Will recommend repeating  procedure in 6-8 months and see her back in the office in 4-5 months to  discuss options with her.           ______________________________  Llana Aliment. Malon Kindle., M.D.     Waldron Session  D:  12/12/2005  T:  12/12/2005  Job:  [id number]   cc:   Ladona Horns. Mariel Sleet, MD  Fax: [phone]   Teena Irani. Arlyce Dice, M.D.  Fax: [phone]

## 2011-04-01 ENCOUNTER — Encounter (INDEPENDENT_AMBULATORY_CARE_PROVIDER_SITE_OTHER): Payer: Self-pay | Admitting: Surgery

## 2011-08-08 ENCOUNTER — Other Ambulatory Visit: Payer: Self-pay | Admitting: Gynecology

## 2011-08-08 LAB — DIFFERENTIAL
Basophils Relative: 1
Eosinophils Absolute: 0 — ABNORMAL LOW
Eosinophils Relative: 1
Lymphs Abs: 1.1
Monocytes Relative: 9

## 2011-08-08 LAB — COMPREHENSIVE METABOLIC PANEL
ALT: 20
AST: 26
Alkaline Phosphatase: 56
CO2: 30
Calcium: 9.3
GFR calc Af Amer: >60
GFR calc non Af Amer: >60
Potassium: 3.9
Sodium: 141

## 2011-08-08 LAB — CBC
Hemoglobin: 15.3 — ABNORMAL HIGH
MCHC: 34.7
RBC: 5.07
WBC: 5

## 2011-08-11 ENCOUNTER — Encounter: Payer: Self-pay | Admitting: Gynecology

## 2011-08-11 ENCOUNTER — Ambulatory Visit (INDEPENDENT_AMBULATORY_CARE_PROVIDER_SITE_OTHER): Payer: Medicare Other | Admitting: Gynecology

## 2011-08-11 ENCOUNTER — Other Ambulatory Visit (HOSPITAL_COMMUNITY)
Admission: RE | Admit: 2011-08-11 | Discharge: 2011-08-11 | Disposition: A | Discharge status: Home / Self Care | Payer: Medicare Other | Source: Ambulatory Visit | Attending: Gynecology | Admitting: Gynecology

## 2011-08-11 DIAGNOSIS — Z860100 Personal history of colon polyps, unspecified: Secondary | ICD-10-CM

## 2011-08-11 DIAGNOSIS — R634 Abnormal weight loss: Secondary | ICD-10-CM

## 2011-08-11 DIAGNOSIS — Z833 Family history of diabetes mellitus: Secondary | ICD-10-CM

## 2011-08-11 DIAGNOSIS — R823 Hemoglobinuria: Secondary | ICD-10-CM

## 2011-08-11 DIAGNOSIS — Z853 Personal history of malignant neoplasm of breast: Secondary | ICD-10-CM | POA: Insufficient documentation

## 2011-08-11 DIAGNOSIS — Z124 Encounter for screening for malignant neoplasm of cervix: Secondary | ICD-10-CM | POA: Insufficient documentation

## 2011-08-11 DIAGNOSIS — Z13220 Encounter for screening for lipoid disorders: Secondary | ICD-10-CM

## 2011-08-11 DIAGNOSIS — Z0000 Encounter for general adult medical examination without abnormal findings: Secondary | ICD-10-CM

## 2011-08-11 DIAGNOSIS — E039 Hypothyroidism, unspecified: Secondary | ICD-10-CM

## 2011-08-11 DIAGNOSIS — M810 Age-related osteoporosis without current pathological fracture: Secondary | ICD-10-CM | POA: Insufficient documentation

## 2011-08-11 DIAGNOSIS — E559 Vitamin D deficiency, unspecified: Secondary | ICD-10-CM

## 2011-08-11 DIAGNOSIS — Z1211 Encounter for screening for malignant neoplasm of colon: Secondary | ICD-10-CM

## 2011-08-11 DIAGNOSIS — C73 Malignant neoplasm of thyroid gland: Secondary | ICD-10-CM

## 2011-08-11 LAB — POC HEMOCCULT BLD/STL (OFFICE/1-CARD/DIAGNOSTIC): Fecal Occult Blood, POC: NEGATIVE

## 2011-08-11 NOTE — Progress Notes (Signed)
Shannon [name] February 26, 1941 [id number]   History:    70 y.o.  presented to the office today to discuss several issues. Patient has a very lengthy past medical history see detail problem list. 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 She recently had her thyroid function test done. Patient also has a history of osteoporosis had been on  Reclast for  2 years but has been off of it on her own accord for the past year. She is taking Caltrate twice a day along with vitamin D 2000 units daily. Her last colonoscopy was in 2010 is scheduled for 1 in November 2011. She's had a prior history of bowel resection by Dr. Jamey Ripa in the past she's been followed by Dr. Randa Evens her gastroenterologist. Her mammogram was in March of this year which was normal and she frequently does result this examination. She has a low body mass index. She also had a history of left breast cancer resulting lumpectomy and radiation back in 1999 and has done well since then.  Past medical history,surgical history, family history and social history were all reviewed and documented in the EPIC chart.  ROS:  Was performed and pertinent positives and negatives are included in the history.  Exam: chaperone present BP 120/70  Ht 5' 3.25" (1.607 m)  Wt 113 lb (51.256 kg)  BMI 19.86 kg/m2  Body mass index is 19.86 kg/(m^2).  General appearance : Well developed well nourished female. No acute distress HEENT: Neck supple, trachea midline, no carotid bruits, no thyroidmegaly Lungs: Clear to auscultation, no rhonchi or wheezes, or rib retractions  Heart: Regular rate and rhythm, no murmurs or gallops Breast:Examined in  sitting and supine position were symmetrical in appearance, no palpable masses or tenderness,  no skin retraction, no nipple inversion, no nipple discharge, no skin discoloration, no axillary or supraclavicular lymphadenopathy Abdomen: no palpable masses or tenderness, no rebound or guarding Extremities: no edema or skin discoloration or tenderness  Pelvic:  Bartholin, Urethra, Skene Glands: Within normal limits             Vagina: No gross lesions or discharge  Cervix: No gross lesions or discharge  Uterus  anteverted, normal size, shape and consistency, non-tender and mobile  Adnexa  Without masses or tenderness  Anus and perineum  normal   Rectovaginal  normal sphincter tone without palpated masses or tenderness             Hemoccult obtained results pending at time of this dictation     Assessment/Plan:  70 y.o. female with history of severe osteoporosis her last bone density study was done in March 2012 which had demonstrated that her BMD in comparison to the previous study of 2010 that there has been a statistically significant increase in the BMD of the AP spine which the radiologist attributed that perhaps due to degenerative changes with a significant decrease in BMD of the total hip and femoral neck. Her total left hip T score of -3.1. Reclast will be reinstituted but prior to that we'll check her BUN and creatinine and because of her low body mass index was greater for diabetes with a  fasting blood sugar we'll check her fasting lipid profile, Hemoccult testing was done today. We'll also be checking her CBC, calcium, vitamin D, PT, urinalysis along with her Pap smear. We'll contract her later this week to start the IV Reclast once a year. We discussed the risks benefits and pros and cons of antiresorptive agent potential risk of osteonecrosis of the jaw and subtrochanteric fractures of the femur for long-term use of these medications. Will consider using a 5-6 years and then proceeding  either with appropriate holiday or switching her over to monoclonal antibody such as Prolia to help protect her bones. She is to continue to take her calcium and vitamin D is to proceed for recommended along with a weightbearing exercises as she's doing at least 45 minutes 3-4 times a week.    Ok Edwards MD, 4:37 PM 08/11/2011

## 2011-08-12 LAB — VITAMIN D 25 HYDROXY (VIT D DEFICIENCY, FRACTURES): Vit D, 25-Hydroxy: 51 ng/mL (ref 30–89)

## 2011-08-12 LAB — CREATININE, SERUM: Creat: 0.78 mg/dL (ref 0.50–1.10)

## 2011-08-23 ENCOUNTER — Telehealth: Payer: Self-pay | Admitting: *Deleted

## 2011-08-23 NOTE — Telephone Encounter (Signed)
Message copied by Libby Maw on Tue Aug 23, 2011 10:26 AM ------      Message from: Richardson Chiquito      Created: Thu Aug 11, 2011  5:01 PM       It looks like this is actually suppose to be for a Reclast. Even though not quite sure what the  Note says, lol. kari      ----- Message -----         From: Ok Edwards, MD         Sent: 08/11/2011   4:47 PM           To: Janus Molder, CMA            Sherrilyn Rist, Ms. Palecek had been on 3 class in the past but stopped on her own for the past year. Please make arrangements to that we can reinstitute her yearly reclines therapy. See notes from today's office visit BUN/creatinine was drawn today patient waiting for your call to schedule. Thank you

## 2011-08-23 NOTE — Telephone Encounter (Signed)
Lm for patient to call.  Want to review benefits for Reclast.

## 2011-08-24 NOTE — Telephone Encounter (Signed)
Patient informed benefits on Reclast. Her portion is approx $400.  Have scheduled for 09/02/11 @8am .  Instructions have been mailed and order faxed.

## 2011-09-02 ENCOUNTER — Ambulatory Visit (HOSPITAL_COMMUNITY)
Admission: RE | Admit: 2011-09-02 | Discharge: 2011-09-02 | Disposition: A | Discharge status: Home / Self Care | Payer: Medicare Other | Source: Ambulatory Visit | Attending: Gynecology | Admitting: Gynecology

## 2011-09-02 DIAGNOSIS — M810 Age-related osteoporosis without current pathological fracture: Secondary | ICD-10-CM | POA: Insufficient documentation

## 2011-11-08 ENCOUNTER — Other Ambulatory Visit: Payer: Self-pay | Admitting: Gynecology

## 2011-11-08 NOTE — Telephone Encounter (Signed)
rx called in

## 2011-11-09 HISTORY — PX: NECK SURGERY: SHX720

## 2012-01-13 ENCOUNTER — Encounter: Payer: Self-pay | Admitting: Gynecology

## 2012-03-08 ENCOUNTER — Other Ambulatory Visit: Payer: Self-pay | Admitting: Gynecology

## 2012-03-08 NOTE — Telephone Encounter (Signed)
rx called in

## 2012-05-31 ENCOUNTER — Other Ambulatory Visit: Payer: Self-pay | Admitting: Gynecology

## 2012-06-28 ENCOUNTER — Other Ambulatory Visit: Payer: Self-pay | Admitting: Gynecology

## 2012-06-28 ENCOUNTER — Encounter: Payer: Self-pay | Admitting: Gynecology

## 2012-06-28 NOTE — Telephone Encounter (Signed)
Will give refills for 2 months. Patient to be reminded she needs her annual exam at the end of October.

## 2012-06-30 ENCOUNTER — Other Ambulatory Visit: Payer: Self-pay | Admitting: Gynecology

## 2012-07-03 NOTE — Telephone Encounter (Signed)
Due for her annual exam in October this year

## 2012-07-05 ENCOUNTER — Other Ambulatory Visit: Payer: Self-pay | Admitting: Gynecology

## 2012-07-05 NOTE — Telephone Encounter (Signed)
rx never went to pharmacy for 06/30/12 date. Pt called office to let us know this, so rx has been called into pharmacy xanxa 1 mg tablet called in with 2 refills. Jf approved this rx on 06/30/12.

## 2012-08-17 ENCOUNTER — Ambulatory Visit (INDEPENDENT_AMBULATORY_CARE_PROVIDER_SITE_OTHER): Payer: Medicare Other | Admitting: Gynecology

## 2012-08-17 ENCOUNTER — Encounter: Payer: Self-pay | Admitting: Gynecology

## 2012-08-17 VITALS — BP 106/70 | Ht 63.5 in | Wt 118.0 lb

## 2012-08-17 DIAGNOSIS — R140 Abdominal distension (gaseous): Secondary | ICD-10-CM

## 2012-08-17 DIAGNOSIS — R141 Gas pain: Secondary | ICD-10-CM

## 2012-08-17 DIAGNOSIS — R142 Eructation: Secondary | ICD-10-CM

## 2012-08-17 DIAGNOSIS — Z853 Personal history of malignant neoplasm of breast: Secondary | ICD-10-CM

## 2012-08-17 DIAGNOSIS — N951 Menopausal and female climacteric states: Secondary | ICD-10-CM

## 2012-08-17 DIAGNOSIS — M810 Age-related osteoporosis without current pathological fracture: Secondary | ICD-10-CM

## 2012-08-17 DIAGNOSIS — Z23 Encounter for immunization: Secondary | ICD-10-CM

## 2012-08-17 NOTE — Progress Notes (Signed)
Shannon [name] [birth year] [id number]   History:    71 y.o.  for her gynecological examination as well to discuss several issues. Patient is complaining of abdominal bloating on and off. She has suffered  also from vaginal atrophy and dryness.Patient has a very lengthy past medical history see detail problem list. She is being followed by her primary physician in [place]. 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 She is taking Caltrate twice a day along with vitamin D 1000 units daily. Her last colonoscopy was in 2010. She's had a prior history of bowel resection by Dr. Jamey Ripa in the past for benign polyps and she's been followed by Dr. Randa Evens her gastroenterologist. Her mammogram was in March of this year which was normal and she frequently does result this examination. She has a low body mass index. She also had a history of left breast cancer resulting lumpectomy and radiation back in 1999 and has done well since then. Patient states that her shingles vaccine and flu vaccine are up-to-date. She states that been over 10 years is much and she can't recall if she has had Tdap or not.   Past medical history,surgical history, family history and social history were all reviewed and documented in the EPIC chart.  Gynecologic History No LMP recorded. Patient is postmenopausal. Contraception: none Last Pap: 2012. Results were: normal Last mammogram: 2013. Results were: normal  Obstetric History OB History    Grav Para Term Preterm Abortions TAB SAB Ect Mult Living   2 2        2      # Outc Date GA Lbr Len/2nd Wgt Sex Del Anes PTL Lv   1 PAR      M SVD      2 PAR     M SVD          ROS: A ROS was performed and pertinent positives and negatives are included in the history.  GENERAL: No fevers or chills. HEENT: No change in vision, no earache, sore throat or sinus congestion. NECK: No pain or stiffness. CARDIOVASCULAR: No chest pain or pressure. No palpitations. PULMONARY: No shortness of breath, cough or wheeze. GASTROINTESTINAL: No abdominal pain, nausea, vomiting or diarrhea, melena or bright red blood per rectum. GENITOURINARY: No urinary frequency, urgency, hesitancy or dysuria. MUSCULOSKELETAL: No joint or muscle pain, no back pain, no recent trauma. DERMATOLOGIC: No rash, no itching, no lesions. ENDOCRINE: No polyuria, polydipsia, no heat or cold intolerance. No recent change in weight. HEMATOLOGICAL: No anemia or easy bruising or bleeding. NEUROLOGIC: No headache, seizures, numbness, tingling or weakness. PSYCHIATRIC: No depression, no loss of interest in normal activity or change in sleep pattern.     Exam: chaperone present  BP 106/70  Ht 5' 3.5" (1.613 m)  Wt 118 lb (53.524 kg)  BMI 20.57 kg/m2  Body mass index is 20.57 kg/(m^2).  General appearance : Well developed well nourished female. No acute distress HEENT: Neck supple, trachea midline, no carotid bruits, no thyroidmegaly Lungs: Clear to auscultation, no rhonchi or wheezes, or rib retractions  Heart: Regular rate and rhythm, no murmurs or  gallops Breast:Examined in sitting and supine position were symmetrical in appearance, no palpable masses or tenderness,  no skin retraction, no nipple inversion, no nipple discharge, no skin discoloration, no axillary or supraclavicular lymphadenopathy Abdomen: no palpable masses or tenderness, no rebound or guarding Extremities: no edema or skin discoloration or tenderness  Pelvic:  Bartholin, Urethra, Skene Glands: Within normal limits             Vagina: No gross lesions or discharge  Cervix: No gross lesions or  discharge  Uterus  axial, normal size, shape and consistency, non-tender and mobile  Adnexa  Without masses or tenderness  Anus and perineum  normal   Rectovaginal  normal sphincter tone without palpated masses or tenderness             Hemoccult cards provided for patient to submit to the office for testing     Assessment/Plan:  71 y.o. with history of severe osteoporosis her last bone density study was done in March 2012 which had demonstrated that her BMD in comparison to the previous study of 2010 that there has been a statistically significant increase in the BMD of the AP spine which the radiologist attributed that perhaps due to degenerative changes with a significant decrease in BMD of the total hip and femoral neck. Her total left hip T score of -3.1. Reclast was reinstituted at last year. She is due in November for her next dose. We will check her BUN, creatinine, calcium, vitamin D and PTH levels today. We discussed the new Pap smear screening guidelines and she will no longer need Pap smears. She was given Hemoccult card system it to the office for testing. She was encouraged to continue to do her monthly breast exams. She was instructed to continue with her calcium and vitamin D along with weightbearing exercises. Tdap vaccine was provided today. Patient will schedule her mammogram and bone density early part of next year. She will contact Dr. Randa Evens her gastroenterologist since she is overdue for followup colonoscopy. She will followup with her primary physician for her general medical exam and labs. She will continue to follow [facility] which she does every 4 months because of her history of thyroid cancer.    Ok Edwards MD, 9:39 AM 08/17/2012

## 2012-08-17 NOTE — Patient Instructions (Addendum)

## 2012-08-18 LAB — BUN: BUN: 14 mg/dL (ref 6–23)

## 2012-08-18 LAB — VITAMIN D 25 HYDROXY (VIT D DEFICIENCY, FRACTURES): Vit D, 25-Hydroxy: 69 ng/mL (ref 30–89)

## 2012-08-20 LAB — PTH, INTACT AND CALCIUM: Calcium, Total (PTH): 10 mg/dL (ref 8.4–10.5)

## 2012-08-23 ENCOUNTER — Telehealth: Payer: Self-pay | Admitting: *Deleted

## 2012-08-23 NOTE — Telephone Encounter (Signed)
Pt was given Reclast benefits. Her benefits are she has to pay 20% until OPM is met, $3400 only $518 is met. Her cost would be approx $400. She states she will think about it and let me know. KW

## 2012-09-07 ENCOUNTER — Ambulatory Visit (INDEPENDENT_AMBULATORY_CARE_PROVIDER_SITE_OTHER): Payer: Medicare Other | Admitting: Gynecology

## 2012-09-07 ENCOUNTER — Ambulatory Visit (INDEPENDENT_AMBULATORY_CARE_PROVIDER_SITE_OTHER): Payer: Medicare Other

## 2012-09-07 ENCOUNTER — Encounter: Payer: Self-pay | Admitting: Gynecology

## 2012-09-07 VITALS — BP 128/74

## 2012-09-07 DIAGNOSIS — R143 Flatulence: Secondary | ICD-10-CM

## 2012-09-07 DIAGNOSIS — Z853 Personal history of malignant neoplasm of breast: Secondary | ICD-10-CM

## 2012-09-07 DIAGNOSIS — R141 Gas pain: Secondary | ICD-10-CM

## 2012-09-07 DIAGNOSIS — R140 Abdominal distension (gaseous): Secondary | ICD-10-CM

## 2012-09-07 DIAGNOSIS — M810 Age-related osteoporosis without current pathological fracture: Secondary | ICD-10-CM

## 2012-09-07 NOTE — Progress Notes (Signed)
71 y.o. with history of severe osteoporosis her last bone density study was done in March 2012 which had demonstrated that her BMD in comparison to the previous study of 2010 that there has been a statistically significant increase in the BMD of the AP spine which the radiologist attributed that perhaps due to degenerative changes with a significant decrease in BMD of the total hip and femoral neck. Her total left hip T score of -3.1. Reclast was reinstituted at last year. She is due in November for her next dose. Her recent BUN/creatinine, calcium and vitamin D were normal, PTH was slightly low. We discussed the new Pap smear screening guidelines and she will no longer need Pap smears. She was given Hemoccult card system it to the office for testing. She was encouraged to continue to do her monthly breast exams. She was instructed to continue with her calcium and vitamin D along with weightbearing exercises. Tdap vaccine was provided today. Patient will schedule her mammogram and bone density early part of next year. She will contact Dr. Randa Evens her gastroenterologist since she is overdue for followup colonoscopy. She will followup with her primary physician for her general medical exam and labs. She will continue to follow [facility] which she does every 4 months because of her history of thyroid cancer.  Patient presented to the office for an ultrasound since she has also had a history of left breast cancer resulting lumpectomy and radiation back in 1999 and has done well since then.   Ultrasound today demonstrated uterus that measured 4.9 x 3.8 x 3.1 cm with endometrial stripe of 3.2 mm and ovaries were normal.  Patient will return back to the office next year for her annual exam or when necessary.

## 2012-09-12 ENCOUNTER — Other Ambulatory Visit: Payer: Self-pay | Admitting: Anesthesiology

## 2012-09-12 DIAGNOSIS — Z1211 Encounter for screening for malignant neoplasm of colon: Secondary | ICD-10-CM

## 2012-09-12 DIAGNOSIS — K921 Melena: Secondary | ICD-10-CM

## 2012-09-13 ENCOUNTER — Other Ambulatory Visit: Payer: Self-pay | Admitting: Gynecology

## 2012-09-13 ENCOUNTER — Telehealth: Payer: Self-pay | Admitting: *Deleted

## 2012-09-13 DIAGNOSIS — Z1211 Encounter for screening for malignant neoplasm of colon: Secondary | ICD-10-CM

## 2012-09-13 NOTE — Telephone Encounter (Signed)
Pt called ready to proceed with Reclast. Pt can do up til 11/18 due to labs being drawn on 08/17/12. Apt 11/18 at 10am at [facility]. Order to be faxed. Reminded pt to take Tylenol ES tid starting the day before infusion for a total of 3 days. Order to be faxed. KW

## 2012-09-14 ENCOUNTER — Other Ambulatory Visit (HOSPITAL_COMMUNITY): Payer: Self-pay | Admitting: *Deleted

## 2012-09-17 ENCOUNTER — Encounter (HOSPITAL_COMMUNITY)
Admission: RE | Admit: 2012-09-17 | Discharge: 2012-09-17 | Disposition: A | Discharge status: Home / Self Care | Payer: Medicare Other | Source: Ambulatory Visit | Attending: Gynecology | Admitting: Gynecology

## 2012-09-17 DIAGNOSIS — M810 Age-related osteoporosis without current pathological fracture: Secondary | ICD-10-CM | POA: Insufficient documentation

## 2012-09-17 MED ORDER — ZOLEDRONIC ACID 5 MG/100ML IV SOLN
5.0000 mg | Freq: Once | INTRAVENOUS | Status: AC
  Administered 2012-09-17: 5 mg via INTRAVENOUS

## 2012-09-17 MED ORDER — ZOLEDRONIC ACID 5 MG/100ML IV SOLN
INTRAVENOUS | Status: AC
  Filled 2012-09-17: qty 100

## 2012-09-28 ENCOUNTER — Other Ambulatory Visit: Payer: Self-pay | Admitting: Gynecology

## 2012-10-01 NOTE — Telephone Encounter (Signed)
rx called in KW 

## 2012-11-28 ENCOUNTER — Other Ambulatory Visit: Payer: Self-pay | Admitting: Gynecology

## 2012-11-28 NOTE — Telephone Encounter (Signed)
Called in [place]

## 2012-12-24 ENCOUNTER — Other Ambulatory Visit: Payer: Self-pay | Admitting: *Deleted

## 2012-12-24 DIAGNOSIS — M810 Age-related osteoporosis without current pathological fracture: Secondary | ICD-10-CM

## 2013-01-21 ENCOUNTER — Encounter: Payer: Self-pay | Admitting: Gynecology

## 2013-01-24 ENCOUNTER — Other Ambulatory Visit: Payer: Self-pay | Admitting: Gynecology

## 2013-01-24 DIAGNOSIS — M810 Age-related osteoporosis without current pathological fracture: Secondary | ICD-10-CM

## 2013-01-24 NOTE — Telephone Encounter (Signed)
rx called in KW 

## 2013-04-18 ENCOUNTER — Other Ambulatory Visit: Payer: Self-pay | Admitting: Gynecology

## 2013-04-18 NOTE — Telephone Encounter (Signed)
Called into pharmacy

## 2013-09-13 ENCOUNTER — Encounter: Payer: Self-pay | Admitting: Gynecology

## 2013-09-13 ENCOUNTER — Ambulatory Visit (INDEPENDENT_AMBULATORY_CARE_PROVIDER_SITE_OTHER): Payer: Medicare Other | Admitting: Gynecology

## 2013-09-13 VITALS — BP 124/68 | Ht 63.5 in | Wt 116.6 lb

## 2013-09-13 DIAGNOSIS — Z853 Personal history of malignant neoplasm of breast: Secondary | ICD-10-CM

## 2013-09-13 DIAGNOSIS — Z86010 Personal history of colonic polyps: Secondary | ICD-10-CM

## 2013-09-13 DIAGNOSIS — N952 Postmenopausal atrophic vaginitis: Secondary | ICD-10-CM

## 2013-09-13 DIAGNOSIS — Z8639 Personal history of other endocrine, nutritional and metabolic disease: Secondary | ICD-10-CM

## 2013-09-13 DIAGNOSIS — M810 Age-related osteoporosis without current pathological fracture: Secondary | ICD-10-CM

## 2013-09-13 LAB — BUN: BUN: 15 mg/dL (ref 6–23)

## 2013-09-13 NOTE — Patient Instructions (Signed)

## 2013-09-13 NOTE — Progress Notes (Signed)
Shannon [name] 1941/05/01 [id number]   History:    72 y.o.  for GYN followup exam. Patient with history of severe osteoporosis her last bone density study was done in March of this year whereby the right hip lowest T. Score was -2.9. When compared with previous study 2 years prior there was a reported 8.2% increase in bone mineral density of the left hip. No significant change in the spine. No right hip previously analyzed for comparison. Reclast was reinstituted in 2012. Her next dose is due this month.  She has suffered also from vaginal atrophy and dryness.Patient has a very lengthy past medical history see detail problem list. She is being followed by her primary physician in [place]. 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 Her last colonoscopy was this year and no polyps were detected.She's had a prior history of bowel resection by Dr. Jamey Ripa in the past for benign polyps and she's been followed by Dr. Randa Evens her gastroenterologist. Her mammogram was in March of this year which was normal and she frequently does result this examination. She has a low body mass index. She also had a history of left breast cancer resulting lumpectomy and radiation back in 1999 and has done well since then. Patient states that her shingles vaccine and flu vaccine are up-to-date   Past medical history,surgical history, family history and social history were all reviewed and documented in the EPIC chart.  Gynecologic History No LMP recorded. Patient is postmenopausal. Contraception: post menopausal status Last Pap: 2012. Results were: normal Last mammogram: 2014. Results were: normal  Obstetric History OB History  Gravida Para Term Preterm AB SAB  TAB Ectopic Multiple Living  2 2        2     # Outcome Date GA Lbr Len/2nd Weight Sex Delivery Anes PTL Lv  2 PAR     M SVD     1 PAR     M SVD          ROS: A ROS was performed and pertinent positives and negatives are included in the history.  GENERAL: No fevers or chills. HEENT: No change in vision, no earache, sore throat or sinus congestion. NECK: No pain or stiffness. CARDIOVASCULAR: No chest pain or pressure. No palpitations. PULMONARY: No shortness of breath, cough or wheeze. GASTROINTESTINAL: No abdominal pain, nausea, vomiting or diarrhea, melena or bright red blood per rectum. GENITOURINARY: No urinary frequency, urgency, hesitancy or dysuria. MUSCULOSKELETAL: No joint or muscle pain, no back pain, no recent trauma. DERMATOLOGIC: No rash, no itching, no lesions. ENDOCRINE: No polyuria, polydipsia, no heat or cold intolerance. No recent change in weight. HEMATOLOGICAL: No anemia or easy bruising or bleeding. NEUROLOGIC: No headache, seizures, numbness, tingling or weakness. PSYCHIATRIC: No depression, no loss of interest in normal activity or change in sleep pattern.     Exam: chaperone present  BP 124/68  Ht 5' 3.5" (1.613 m)  Wt 116 lb 9.6 oz (52.889 kg)  BMI 20.33 kg/m2  Body mass index is 20.33 kg/(m^2).  General appearance : Well developed well nourished female. No acute distress HEENT: Neck supple, trachea midline, no carotid bruits, no thyroidmegaly Lungs: Clear to auscultation, no rhonchi or wheezes, or rib retractions  Heart:  Regular rate and rhythm, no murmurs or gallops Breast:Examined in sitting and supine position were symmetrical in appearance, no palpable masses or tenderness,  no skin retraction, no nipple inversion, no nipple discharge, no skin discoloration, no axillary or supraclavicular lymphadenopathy Abdomen: no palpable masses or tenderness, no rebound or guarding Extremities: no edema or skin discoloration or tenderness  Pelvic:  Bartholin, Urethra,  Skene Glands: Within normal limits             Vagina: No gross lesions or discharge,atrophic changes  Cervix: No gross lesions or discharge  Uterus  axial, normal size, shape and consistency, non-tender and mobile  Adnexa  Without masses or tenderness  Anus and perineum  normal   Rectovaginal  normal sphincter tone without palpated masses or tenderness             Hemoccult PCP we'll provide     Assessment/Plan:  72 y.o. female with history of osteoporosis. Patient on second year of Reclast. Review of bone density study from this year had demonstrated that she had significant improvement since the start of Reclast. Once again we went over the risks benefits and pros and cons of antiresorptive agents to include subtrochanteric fractures and osteonecrosis of the jaw. We will keep patient on the medication not to exceed 6-7 years and then go on for holiday. She will continue with her calcium and vitamin D and regular exercise. We will check today the following labs before her next infusion: Calcium, BUN, creatinine and vitamin D level. Pap smear was not done today in accordance with new guidelines. We will schedule an ultrasound here in the office in the next few weeks for better assessment of her ovaries as a result of her history.  Note: This dictation was prepared with  Dragon/digital dictation along withSmart phrase technology. Any transcriptional errors that result from this process are unintentional.   Ok Edwards MD, 8:46 AM 09/13/2013

## 2013-09-14 LAB — VITAMIN D 25 HYDROXY (VIT D DEFICIENCY, FRACTURES): Vit D, 25-Hydroxy: 80 ng/mL (ref 30–89)

## 2013-09-18 ENCOUNTER — Telehealth: Payer: Self-pay | Admitting: *Deleted

## 2013-09-18 NOTE — Telephone Encounter (Signed)
Pt informed labs normal, reclast can be scheduled. Apt 10/04/13 at 9am at [facility]. I called patients insurance Reclast covered in Part B and pt responsible for 20%.  Pt ok with this. Order faxed to [facility]. KW CMA

## 2013-10-03 ENCOUNTER — Other Ambulatory Visit (HOSPITAL_COMMUNITY): Payer: Self-pay | Admitting: *Deleted

## 2013-10-03 ENCOUNTER — Other Ambulatory Visit: Payer: Self-pay | Admitting: Gynecology

## 2013-10-03 NOTE — Telephone Encounter (Signed)
Called into pharmacy

## 2013-10-04 ENCOUNTER — Ambulatory Visit (HOSPITAL_COMMUNITY)
Admission: RE | Admit: 2013-10-04 | Discharge: 2013-10-04 | Disposition: A | Discharge status: Home / Self Care | Payer: Medicare Other | Source: Ambulatory Visit | Attending: Gynecology | Admitting: Gynecology

## 2013-10-04 DIAGNOSIS — M810 Age-related osteoporosis without current pathological fracture: Secondary | ICD-10-CM | POA: Insufficient documentation

## 2013-10-04 MED ORDER — ZOLEDRONIC ACID 5 MG/100ML IV SOLN: 5.0000 mg | Freq: Once | INTRAVENOUS | Status: DC

## 2013-10-04 MED ORDER — ZOLEDRONIC ACID 5 MG/100ML IV SOLN
INTRAVENOUS | Status: AC
  Administered 2013-10-04: 5 mg
  Filled 2013-10-04: qty 100

## 2013-10-31 DIAGNOSIS — C3490 Malignant neoplasm of unspecified part of unspecified bronchus or lung: Secondary | ICD-10-CM

## 2013-10-31 HISTORY — DX: Malignant neoplasm of unspecified part of unspecified bronchus or lung: C34.90

## 2014-01-29 ENCOUNTER — Other Ambulatory Visit: Payer: Self-pay | Admitting: Gynecology

## 2014-01-29 NOTE — Telephone Encounter (Signed)
Called into pharmacy

## 2014-02-10 ENCOUNTER — Encounter: Payer: Self-pay | Admitting: Gynecology

## 2014-07-24 ENCOUNTER — Other Ambulatory Visit: Payer: Self-pay | Admitting: *Deleted

## 2014-07-24 MED ORDER — ALPRAZOLAM 1 MG PO TABS: ORAL_TABLET | ORAL | Status: DC

## 2014-07-24 NOTE — Telephone Encounter (Signed)
rx called in

## 2014-07-24 NOTE — Telephone Encounter (Signed)
Pt annual due in Nov. 2015

## 2014-09-01 ENCOUNTER — Encounter: Payer: Self-pay | Admitting: Gynecology

## 2014-10-10 ENCOUNTER — Encounter: Payer: Self-pay | Admitting: Gynecology

## 2014-10-10 ENCOUNTER — Ambulatory Visit (INDEPENDENT_AMBULATORY_CARE_PROVIDER_SITE_OTHER): Payer: Medicare Other | Admitting: Gynecology

## 2014-10-10 VITALS — BP 136/78 | Ht 64.0 in | Wt 112.0 lb

## 2014-10-10 DIAGNOSIS — Z8639 Personal history of other endocrine, nutritional and metabolic disease: Secondary | ICD-10-CM

## 2014-10-10 DIAGNOSIS — N952 Postmenopausal atrophic vaginitis: Secondary | ICD-10-CM

## 2014-10-10 DIAGNOSIS — Z853 Personal history of malignant neoplasm of breast: Secondary | ICD-10-CM

## 2014-10-10 DIAGNOSIS — M810 Age-related osteoporosis without current pathological fracture: Secondary | ICD-10-CM

## 2014-10-10 DIAGNOSIS — Z85850 Personal history of malignant neoplasm of thyroid: Secondary | ICD-10-CM

## 2014-10-10 DIAGNOSIS — R636 Underweight: Secondary | ICD-10-CM

## 2014-10-10 NOTE — Progress Notes (Signed)
Shannon [name] 28-Jul-1941 [id number]   History:    73 y.o.  for GYN exam and follow-up. Patient with history of severe osteoporosis her last bone density study was done in March of this year whereby the right hip lowest T. Score was -2.9. When compared with previous study 2 years prior there was a reported 8.2% increase in bone mineral density of the left hip. No significant change in the spine. No right hip previously analyzed for comparison. Reclast was reinstituted in 2012. Her next dose is due this month. She has been followed by 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 Her last colonoscopy was normal in 2014 with no polyps.She's had a prior history of bowel resection by Dr. Margot Chimes in the past for benign polyps and she's been followed by Dr. Oletta Lamas her gastroenterologist. Patient with no past history of abnormal Pap smears. Her mammogram was normal this year.  Patient reports that she has had for many years elevated calcitonin levels.Patient has a very lengthy past medical history see detail problem list. She is being followed by her primary physician in [place] as well. She is taking calcium with vitamin D daily.She has a low body mass index. She also had a history of left breast cancer resulting lumpectomy and radiation back in 1999 patient states that her shingles vaccine and all other vaccines are up-to-date.   Past medical history,surgical history, family history and social history were all reviewed and documented in the EPIC chart.  Gynecologic History No LMP recorded. Patient is postmenopausal. Contraception: post menopausal status Last Pap: 2012. Results were: normal Last mammogram: 2015. Results were: normal  Obstetric History OB History  Gravida  Para Term Preterm AB SAB TAB Ectopic Multiple Living  2 2        2     # Outcome Date GA Lbr Len/2nd Weight Sex Delivery Anes PTL Lv  2 Para     M Vag-Spont     1 Para     M Vag-Spont          ROS: A ROS was performed and pertinent positives and negatives are included in the history.  GENERAL: No fevers or chills. HEENT: No change in vision, no earache, sore throat or sinus congestion. NECK: No pain or stiffness. CARDIOVASCULAR: No chest pain or pressure. No palpitations. PULMONARY: No shortness of breath, cough or wheeze. GASTROINTESTINAL: No abdominal pain, nausea, vomiting or diarrhea, melena or bright red blood per rectum. GENITOURINARY: No urinary frequency, urgency, hesitancy or dysuria. MUSCULOSKELETAL: No joint or muscle pain, no back pain, no recent trauma. DERMATOLOGIC: No rash, no itching, no lesions. ENDOCRINE: No polyuria, polydipsia, no heat or cold intolerance. No recent change in weight. HEMATOLOGICAL: No anemia or easy bruising or bleeding. NEUROLOGIC: No headache, seizures, numbness, tingling or weakness. PSYCHIATRIC: No depression, no loss of interest in normal activity or change in sleep pattern.     Exam: chaperone present  BP 136/78 mmHg  Ht 5\' 4"  (1.626 m)  Wt 112 lb (50.803 kg)  BMI 19.22 kg/m2  Body mass index is 19.22 kg/(m^2).  General appearance : Well developed well nourished female. No acute distress HEENT: Neck supple, trachea midline, no carotid bruits, no thyroidmegaly Lungs: Clear to auscultation, no rhonchi or wheezes, or rib retractions  Heart: Regular  rate and rhythm, no murmurs or gallops Breast:Examined in sitting and supine position were symmetrical in appearance, no palpable masses or tenderness,  no skin retraction, no nipple inversion, no nipple discharge, no skin discoloration, no axillary or supraclavicular lymphadenopathy Abdomen: no palpable masses or tenderness, no rebound or guarding Extremities: no edema or skin discoloration or  tenderness  Pelvic:  Bartholin, Urethra, Skene Glands: Within normal limits             Vagina: No gross lesions or discharge, atrophic changes  Cervix: No gross lesions or discharge  Uterus  axial axial, normal size, shape and consistency, non-tender and mobile  Adnexa  Without masses or tenderness  Anus and perineum  normal   Rectovaginal  normal sphincter tone without palpated masses or tenderness             Hemoccult PCP provides     Assessment/Plan:  73 y.o. female with history of osteoporosis. In January will be the third year that patient is on Reclast IV. We are going to check her BUN, creatinine, calcium, vitamin D and PTH level today. Patient had informed me that for years she has been fluctuating with elevated bile calcium levels. Increase bone resorption is the major mechanism by which tumors produce hypercalcemia. We will wait for a calcium level to return. Agent such as Reclast inhibited osteoclast Activity or highly effective and management of these patients. I will clear it with her endocrinologist before the next IV infusion. Pap smear not done today according to the new guidelines.   Terrance Mass MD, 12:21 PM 10/10/2014

## 2014-10-11 LAB — VITAMIN D 25 HYDROXY (VIT D DEFICIENCY, FRACTURES): Vit D, 25-Hydroxy: 62 ng/mL (ref 30–100)

## 2014-10-11 LAB — CREATININE, SERUM: Creat: 0.65 mg/dL (ref 0.50–1.10)

## 2014-10-11 LAB — BUN: BUN: 12 mg/dL (ref 6–23)

## 2014-10-13 LAB — PTH, INTACT AND CALCIUM
Calcium: 9.5 mg/dL (ref 8.4–10.5)
PTH: 21 pg/mL (ref 14–64)

## 2014-11-11 ENCOUNTER — Telehealth: Payer: Self-pay | Admitting: Gynecology

## 2014-11-11 DIAGNOSIS — M810 Age-related osteoporosis without current pathological fracture: Secondary | ICD-10-CM

## 2014-11-11 NOTE — Telephone Encounter (Signed)
Phone call to patient regarding Reclast IV . Per Dr Moshe Salisbury note on 10/10/14 patient needed labwork and then clear with endocrinologist Lanier Clam, MD) at [facility] prior to next infusion. I will call Dr Dara Lords assistant at [facility] to check on approval. Patient has an appointment with her in April 2016.

## 2014-11-18 ENCOUNTER — Other Ambulatory Visit: Payer: Self-pay

## 2014-11-18 MED ORDER — ALPRAZOLAM 1 MG PO TABS: ORAL_TABLET | ORAL | Status: DC

## 2014-11-18 NOTE — Telephone Encounter (Signed)
Called into pharmacy

## 2014-11-18 NOTE — Telephone Encounter (Signed)
Phone call to Brande , have called [facility] several times unable to get thru to nurse. Told her I will continue to try and get back to her ASAP.

## 2014-11-25 ENCOUNTER — Telehealth: Payer: Self-pay

## 2014-11-25 NOTE — Telephone Encounter (Signed)
Talked with Triage Nurse Rod Holler) on 11/18/14 regarding ok thur Lanier Clam, MD for Reclast. Nurse Rod Holler requested that I fax all medical information regarding Dr Toney Rakes notes and recent bone density to Dr Dara Lords at [facility]. Dr Toney Rakes ok'd this information to be sent to Dr. Dara Lords. All information was faxed to [phone] Dr Dara Lords office with attention Lattie Haw (her Nurse). Waiting on reply.

## 2014-11-25 NOTE — Telephone Encounter (Signed)
I had an old message in my voice mail from last week that was for [facility]. I forwarded the message to her extension. It was Malachy Mood at [facility] stating that  Dr. Dara Lords said it is okay for this patient to have IV Reclast.

## 2014-11-25 NOTE — Telephone Encounter (Addendum)
Telephone call quote from Shannon [name]  I had an old message in my voice mail from last week that was for [facility]. I forwarded the message to her extension. It was Shannon [name] at [facility] stating that Dr. Dara Lords said it is okay for this patient to have IV Reclast.  Received this message today.  I will send information to insurance for benefits. I called Shannon [name] and informed her of the results and that information will be sent for insurance coverage benefits.   Confirmed insurance coverage no changes per patient for 2016

## 2014-12-03 NOTE — Telephone Encounter (Signed)
Phone call to patient, left a message . Reclast has been approved by Jennifer Perkins, MD  At [facility], Insurance coverage for Reclast, Blue e 2016  $ 0 deductible, OFP max $3950.00, 20% OFP to be met , her 20% will be approx $400.00. Patient needs to have blood drawn for Calcium and Creatinine level . Left message for patient to return my phone call on answering machine., 

## 2014-12-16 NOTE — Telephone Encounter (Signed)
Talked with Zigmund Daniel . She wants to wait until after her appointment with Dr Dara Lords (02/05/15) to have her RECLAST infusion. She will call back after this appointment. If I have not heard from her within one week I will call.

## 2014-12-30 NOTE — Telephone Encounter (Signed)
Pt called today she has decided not to wait until April. She wants to schedule her Reclast . She will need to have lab work for Bun, Creatinine level and Calcium prior to Reclast. Her benefit are listed below. 12/03/14

## 2015-01-02 ENCOUNTER — Other Ambulatory Visit: Payer: Medicare Other

## 2015-01-02 DIAGNOSIS — M810 Age-related osteoporosis without current pathological fracture: Secondary | ICD-10-CM

## 2015-01-02 LAB — BUN: BUN: 14 mg/dL (ref 6–23)

## 2015-01-02 LAB — CALCIUM: CALCIUM: 9.4 mg/dL (ref 8.4–10.5)

## 2015-01-02 LAB — CREATININE, SERUM: CREATININE: 0.81 mg/dL (ref 0.50–1.10)

## 2015-01-14 NOTE — Telephone Encounter (Signed)
We received approval for Reclast . Her benefits are  Deduct ($0)   OPM $3950  ( $2.28 met).  Part B  No PA required. Pt responsible for 20%  ( approx $400.00) . Left message on voice mail . Needs to come in for blood work prior to scheduling Reclast infusion.

## 2015-01-16 NOTE — Telephone Encounter (Signed)
Pt had bloodwork done at [place] 01/02/15. Reclast scheduled at [facility] for 01/28/15 at 9am. Pt informed and instructions given and instruction sheet mailed. [facility]

## 2015-01-27 ENCOUNTER — Other Ambulatory Visit (HOSPITAL_COMMUNITY): Payer: Self-pay | Admitting: *Deleted

## 2015-01-28 ENCOUNTER — Ambulatory Visit (HOSPITAL_COMMUNITY)
Admission: RE | Admit: 2015-01-28 | Discharge: 2015-01-28 | Disposition: A | Discharge status: Home / Self Care | Payer: Medicare Other | Source: Ambulatory Visit | Attending: Gynecology | Admitting: Gynecology

## 2015-01-28 DIAGNOSIS — M810 Age-related osteoporosis without current pathological fracture: Secondary | ICD-10-CM | POA: Diagnosis present

## 2015-01-28 MED ORDER — ZOLEDRONIC ACID 5 MG/100ML IV SOLN
5.0000 mg | Freq: Once | INTRAVENOUS | Status: AC
  Administered 2015-01-28: 5 mg via INTRAVENOUS

## 2015-01-28 MED ORDER — ZOLEDRONIC ACID 5 MG/100ML IV SOLN
INTRAVENOUS | Status: AC
Start: 2015-01-28 — End: 2015-01-28
  Filled 2015-01-28: qty 100

## 2015-02-05 ENCOUNTER — Encounter: Payer: Self-pay | Admitting: Gynecology

## 2015-02-09 ENCOUNTER — Encounter: Payer: Self-pay | Admitting: Gynecology

## 2015-05-06 ENCOUNTER — Other Ambulatory Visit: Payer: Self-pay | Admitting: Gynecology

## 2015-05-06 NOTE — Telephone Encounter (Signed)
Rx called in [place]

## 2015-05-06 NOTE — Telephone Encounter (Signed)
Rx called in 

## 2015-05-06 NOTE — Telephone Encounter (Signed)
Annual due in Dec. 2016

## 2015-05-06 NOTE — Telephone Encounter (Signed)
Annual due in Dec 2016. [place] for refill? JF patient. KW CMA

## 2015-10-15 ENCOUNTER — Ambulatory Visit (INDEPENDENT_AMBULATORY_CARE_PROVIDER_SITE_OTHER): Payer: Medicare Other | Admitting: Gynecology

## 2015-10-15 ENCOUNTER — Encounter: Payer: Self-pay | Admitting: Gynecology

## 2015-10-15 VITALS — BP 120/80 | Ht 63.5 in | Wt 112.0 lb

## 2015-10-15 DIAGNOSIS — Z01419 Encounter for gynecological examination (general) (routine) without abnormal findings: Secondary | ICD-10-CM

## 2015-10-15 DIAGNOSIS — Z8639 Personal history of other endocrine, nutritional and metabolic disease: Secondary | ICD-10-CM

## 2015-10-15 DIAGNOSIS — M810 Age-related osteoporosis without current pathological fracture: Secondary | ICD-10-CM

## 2015-10-15 DIAGNOSIS — Z853 Personal history of malignant neoplasm of breast: Secondary | ICD-10-CM

## 2015-10-15 DIAGNOSIS — Z85850 Personal history of malignant neoplasm of thyroid: Secondary | ICD-10-CM | POA: Diagnosis not present

## 2015-10-15 NOTE — Progress Notes (Signed)
Shannon [name] 1941-02-18 [id number]   History:    74 y.o.  for annual gyn exam with no complaints today. Review of her record indicated that she has a history of severe osteoporosis as was noted on her bone density study in March 2015. Her lowest T score was at the right hip with a score of -2.9. When it was compared with previous study 2 years prior there was a reported 8.2% increase in bone mineral density of the left hip. No significant change in the spine. No right hip previously analyzed for comparison. Reclast was reinstituted in 2012. Her next dose is due in March 2017. Her bone density study in April of this year demonstrated once again her lowest T score was at the right femoral neck -2.8 and no statistically significant change in bone density when compared with 2014.  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 Her last colonoscopy was normal in 2014 with no polyps.She's had a prior history of bowel resection by Dr. Margot Chimes in the past for benign polyps and she's been followed by Dr. Oletta Lamas her gastroenterologist. Patient with no past history of abnormal Pap smears. Her mammogram was normal this year.  Patient reports that she has had for many years elevated calcitonin levels.Patient has a very lengthy past medical history see detail problem list. She is being followed by her primary physician in [place] as well. She is taking calcium with vitamin D daily.She has a low body mass index. She also had a history of left breast cancer resulting lumpectomy and radiation back in 1999 patient states that her shingles vaccine and all other vaccines are up-to-date  Past medical history,surgical history, family history and social history were all  reviewed and documented in the EPIC chart.  Gynecologic History No LMP recorded. Patient is postmenopausal. Contraception: post menopausal status Last Pap: 2012. Results were: normal Last mammogram: 2016. Results were: normal  Obstetric History OB History  Gravida Para Term Preterm AB SAB TAB Ectopic Multiple Living  '2 2        2    '$ # Outcome Date GA Lbr Len/2nd Weight Sex Delivery Anes PTL Lv  2 Para     M Vag-Spont     1 Para     M Vag-Spont          ROS: A ROS was performed and pertinent positives and negatives are included in the history.  GENERAL: No fevers or chills. HEENT: No change in vision, no earache, sore throat or sinus congestion. NECK: No pain or stiffness. CARDIOVASCULAR: No chest pain or pressure. No palpitations. PULMONARY: No shortness of breath, cough or wheeze. GASTROINTESTINAL: No abdominal pain, nausea, vomiting or diarrhea, melena or bright red blood per rectum. GENITOURINARY: No urinary frequency, urgency, hesitancy or dysuria. MUSCULOSKELETAL: No joint or muscle pain, no back pain, no recent trauma. DERMATOLOGIC: No rash, no itching, no lesions. ENDOCRINE: No polyuria, polydipsia, no heat or cold intolerance. No recent change in weight. HEMATOLOGICAL: No anemia or easy bruising or bleeding. NEUROLOGIC: No headache, seizures, numbness, tingling or weakness. PSYCHIATRIC: No depression, no loss of interest in normal activity or change in sleep pattern.     Exam: chaperone present  BP 120/80 mmHg  Ht 5' 3.5" (1.613 m)  Wt 112 lb (50.803 kg)  BMI 19.53 kg/m2  Body mass index is 19.53 kg/(m^2).  General appearance : Well developed well nourished female. No acute distress HEENT: Eyes: no retinal hemorrhage or exudates,  Neck supple, trachea midline, no carotid bruits, no thyroidmegaly Lungs: Clear to auscultation, no rhonchi or wheezes, or rib retractions  Heart: Regular rate and rhythm, no murmurs or gallops Breast:Examined in sitting and supine position were  symmetrical in appearance, no palpable masses or tenderness,  no skin retraction, no nipple inversion, no nipple discharge, no skin discoloration, no axillary or supraclavicular lymphadenopathy Abdomen: no palpable masses or tenderness, no rebound or guarding Extremities: no edema or skin discoloration or tenderness  Pelvic:  Bartholin, Urethra, Skene Glands: Within normal limits             Vagina: No gross lesions or discharge, atrophic changes  Cervix: No gross lesions or discharge  Uterus  axial, normal size, shape and consistency, non-tender and mobile  Adnexa  Without masses or tenderness  Anus and perineum  normal   Rectovaginal  normal sphincter tone without palpated masses or tenderness             Hemoccult PCP provides     Assessment/Plan:  74 y.o. female for annual exam with history of osteoporosis on Reclast IV which she receives yearly. Her next dose is due in March. Patient will come by the office late February of her calcium and vitamin D level checked. We are also going to order an ultrasound to look at her ovaries as well because of occasional bloating and also because her past history of breast and thyroid cancer. Her PCP has been doing her blood work. Her vaccinations are up-to-date. Follow bone density study in 2018.   Terrance Mass MD, 9:01 AM 10/15/2015

## 2015-10-22 ENCOUNTER — Other Ambulatory Visit: Payer: Self-pay | Admitting: Gynecology

## 2015-10-22 NOTE — Telephone Encounter (Signed)
Last filled in July 2016 with no refills

## 2015-10-22 NOTE — Telephone Encounter (Signed)
Rx called

## 2015-11-20 ENCOUNTER — Other Ambulatory Visit: Payer: Self-pay | Admitting: Gynecology

## 2015-11-20 NOTE — Telephone Encounter (Signed)
Called into pharmacy

## 2015-12-07 ENCOUNTER — Other Ambulatory Visit: Payer: Medicare Other

## 2015-12-23 ENCOUNTER — Ambulatory Visit (INDEPENDENT_AMBULATORY_CARE_PROVIDER_SITE_OTHER): Payer: Medicare Other | Admitting: Gynecology

## 2015-12-23 ENCOUNTER — Other Ambulatory Visit: Payer: Self-pay | Admitting: Gynecology

## 2015-12-23 ENCOUNTER — Encounter: Payer: Self-pay | Admitting: Gynecology

## 2015-12-23 ENCOUNTER — Ambulatory Visit (INDEPENDENT_AMBULATORY_CARE_PROVIDER_SITE_OTHER): Payer: Medicare Other

## 2015-12-23 ENCOUNTER — Ambulatory Visit: Payer: Medicare Other | Admitting: Gynecology

## 2015-12-23 ENCOUNTER — Other Ambulatory Visit: Payer: Medicare Other

## 2015-12-23 VITALS — BP 128/70

## 2015-12-23 DIAGNOSIS — Z8639 Personal history of other endocrine, nutritional and metabolic disease: Secondary | ICD-10-CM | POA: Diagnosis not present

## 2015-12-23 DIAGNOSIS — Z85850 Personal history of malignant neoplasm of thyroid: Secondary | ICD-10-CM

## 2015-12-23 DIAGNOSIS — D251 Intramural leiomyoma of uterus: Secondary | ICD-10-CM | POA: Diagnosis not present

## 2015-12-23 DIAGNOSIS — M810 Age-related osteoporosis without current pathological fracture: Secondary | ICD-10-CM | POA: Diagnosis not present

## 2015-12-23 DIAGNOSIS — Z853 Personal history of malignant neoplasm of breast: Secondary | ICD-10-CM

## 2015-12-23 DIAGNOSIS — N7011 Chronic salpingitis: Secondary | ICD-10-CM | POA: Diagnosis not present

## 2015-12-23 NOTE — Progress Notes (Signed)
   Patient is a 75 year old who presented to the office today for annual ultrasound to better assess her ovaries to the fact that she's had a past history of breast cancer as well as thyroid cancer. Patient also with history of osteoporosis and past history vitamin D deficiency. She is currently on Reclast IV annual infusion and is here for her blood work before her upcoming dose is due.  Ultrasound today: Uterus measured 5.1 x 3.9 x 3.1 cm with endometrial stripe 1.2 mm. A small intramural fibroid measured 15 x 15 x 11 mm was noted. Right ovary was normal and atrophic: Left ovary was normal atrophic. Left adnexal thinwall echo-free tubular cystic area measuring 9 x 4 x 9 mm possible small hydrosalpinx.  Assessment/plan: Postmenopausal patient with past history of breast and thyroid cancer and osteoporosis. Ultrasound demonstrating normal uterus and ovaries. Incidental finding of a small hydrosalpinx on the left adnexal region. Patient was reassured. Patient with no vaginal bleeding. The following blood work will be ordered today before her next Reclast : BUN and creatinine, calcium, vitamin D and PTH level. Patient was reminded to continue her calcium vitamin D and weightbearing exercises. Her PCP her oncologist been doing her blood work.

## 2015-12-23 NOTE — Addendum Note (Signed)
Addended by: Joaquin Music on: 12/23/2015 10:57 AM   Modules accepted: Orders

## 2015-12-24 LAB — VITAMIN D 25 HYDROXY (VIT D DEFICIENCY, FRACTURES): Vit D, 25-Hydroxy: 63 ng/mL (ref 30–100)

## 2015-12-28 ENCOUNTER — Telehealth: Payer: Self-pay | Admitting: *Deleted

## 2015-12-28 NOTE — Telephone Encounter (Signed)
Pharmacy prior authorization for Xanax '1mg'$  was done online via cover my meds website, received a letter today stating medication is not approved by FDA labeled or medically accepted use.  This medication is not on pt formulary list.

## 2016-01-21 ENCOUNTER — Telehealth: Payer: Self-pay | Admitting: Gynecology

## 2016-01-21 DIAGNOSIS — M810 Age-related osteoporosis without current pathological fracture: Secondary | ICD-10-CM

## 2016-01-21 NOTE — Telephone Encounter (Addendum)
Left message on  VM regarding 30 day window for Reclast blood work,  Reclast is due after 01/29/16. Left message to call back.

## 2016-01-25 NOTE — Telephone Encounter (Signed)
PC to pt regarding Reclast . She will come in for labs. To call and leave message for date she wants to come in.

## 2016-02-02 NOTE — Telephone Encounter (Signed)
Coming 02/05/16  Blood work

## 2016-02-05 ENCOUNTER — Ambulatory Visit: Payer: Medicare Other

## 2016-02-05 ENCOUNTER — Other Ambulatory Visit: Payer: Medicare Other

## 2016-02-05 ENCOUNTER — Other Ambulatory Visit: Payer: Self-pay | Admitting: Gynecology

## 2016-02-06 LAB — VITAMIN D 25 HYDROXY (VIT D DEFICIENCY, FRACTURES): Vit D, 25-Hydroxy: 61 ng/mL (ref 30–100)

## 2016-02-06 LAB — CREATININE, SERUM: Creat: 0.73 mg/dL (ref 0.60–0.93)

## 2016-02-06 LAB — CALCIUM: CALCIUM: 9.2 mg/dL (ref 8.6–10.4)

## 2016-02-06 LAB — BUN: BUN: 13 mg/dL (ref 7–25)

## 2016-02-08 LAB — PTH, INTACT AND CALCIUM
CALCIUM: 9.6 mg/dL (ref 8.4–10.5)
PTH: 21 pg/mL (ref 14–64)

## 2016-02-09 NOTE — Telephone Encounter (Signed)
BUN  13.0   Creatinine  0.73  Calcium 9.2  On 02/05/16   Will schedule Reclast the week of February 22 2016

## 2016-02-11 NOTE — Telephone Encounter (Signed)
Schedule Reclast for 02/22/16   at 10 am at [facility]

## 2016-02-16 ENCOUNTER — Encounter: Payer: Self-pay | Admitting: Gynecology

## 2016-02-16 NOTE — Telephone Encounter (Addendum)
Talked with Ramonica and she will have Reclast on 02/22/16 at 10 am  , instruction sheet mailed.  NO PA needed, OOPM $4900 (79.72 met), Part B 20% co pay for Reclast. 

## 2016-02-19 ENCOUNTER — Other Ambulatory Visit (HOSPITAL_COMMUNITY): Payer: Self-pay | Admitting: *Deleted

## 2016-02-22 ENCOUNTER — Ambulatory Visit (HOSPITAL_COMMUNITY)
Admission: RE | Admit: 2016-02-22 | Discharge: 2016-02-22 | Disposition: A | Discharge status: Home / Self Care | Payer: Medicare Other | Source: Ambulatory Visit | Attending: Gynecology | Admitting: Gynecology

## 2016-02-22 DIAGNOSIS — M810 Age-related osteoporosis without current pathological fracture: Secondary | ICD-10-CM | POA: Diagnosis present

## 2016-02-22 MED ORDER — ZOLEDRONIC ACID 5 MG/100ML IV SOLN
INTRAVENOUS | Status: AC
  Administered 2016-02-22: 5 mg via INTRAVENOUS
  Filled 2016-02-22: qty 100

## 2016-02-22 MED ORDER — ZOLEDRONIC ACID 5 MG/100ML IV SOLN
5.0000 mg | Freq: Once | INTRAVENOUS | Status: AC
  Administered 2016-02-22: 5 mg via INTRAVENOUS

## 2016-03-16 ENCOUNTER — Other Ambulatory Visit: Payer: Self-pay | Admitting: Gynecology

## 2016-03-16 NOTE — Telephone Encounter (Signed)
Rx called in to pharmacy. 

## 2016-08-31 ENCOUNTER — Other Ambulatory Visit: Payer: Self-pay | Admitting: Gynecology

## 2016-08-31 NOTE — Telephone Encounter (Signed)
Rx called in 

## 2016-08-31 NOTE — Telephone Encounter (Signed)
Annual scheduled on 10/17/16

## 2016-10-17 ENCOUNTER — Ambulatory Visit (INDEPENDENT_AMBULATORY_CARE_PROVIDER_SITE_OTHER): Payer: Medicare Other | Admitting: Gynecology

## 2016-10-17 ENCOUNTER — Encounter: Payer: Self-pay | Admitting: Gynecology

## 2016-10-17 VITALS — BP 130/70 | Ht 63.5 in | Wt 111.0 lb

## 2016-10-17 DIAGNOSIS — Z01411 Encounter for gynecological examination (general) (routine) with abnormal findings: Secondary | ICD-10-CM | POA: Diagnosis not present

## 2016-10-17 DIAGNOSIS — Z853 Personal history of malignant neoplasm of breast: Secondary | ICD-10-CM

## 2016-10-17 DIAGNOSIS — M810 Age-related osteoporosis without current pathological fracture: Secondary | ICD-10-CM

## 2016-10-17 DIAGNOSIS — Z85850 Personal history of malignant neoplasm of thyroid: Secondary | ICD-10-CM

## 2016-10-17 NOTE — Progress Notes (Addendum)
Shannon [name] 01-26-1941 [id number]   History:    75 y.o.  for annual gyn exam with no complaints today. Patient with history of  osteoporosis noted on bone density study in March 2015.Her lowest T score was at the right hip with a score of -2.9. When it was compared with previous study 2 years prior there was a reported 8.2% increase in bone mineral density of the left hip. No significant change in the spine. No right hip previously analyzed for comparison. Reclast was reinstituted in 2012. Her next dose is due in March of 2018. Her bone density study in April 2016 demonstrated once again her lowest T score was at the right femoral neck -2.8 and no statistically significant change in bone density when compared with 2014.  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 Her last colonoscopy was normal in 2014 with no polyps.She's had a prior history of bowel resection by Dr. Margot Chimes in the past for benign polyps and she's been followed by Dr. Oletta Lamas her gastroenterologist. Patient with no past history of abnormal Pap smears. Her mammogram was normal this year.  Patient reports that she has had for many years elevated calcitonin levels.Patient has a very lengthy past medical history see detail problem list. She is being followed by her primary physician in [place] as well. She is taking calcium with vitamin D daily.She has a low body mass index. She also had a history of left breast cancer resulting lumpectomy and radiation back in 1999 patient states that her shingles vaccine and all other vaccines are up-to-date   Past medical history,surgical history, family history and social history were all reviewed and documented in the EPIC  chart.  Gynecologic History No LMP recorded. Patient is postmenopausal. Contraception: post menopausal status Last Pap: 2012. Results were: normal Last mammogram: 2017. Results were: normal  Obstetric History OB History  Gravida Para Term Preterm AB Living  '2 2       2  '$ SAB TAB Ectopic Multiple Live Births               # Outcome Date GA Lbr Len/2nd Weight Sex Delivery Anes PTL Lv  2 Para     M Vag-Spont     1 Para     M Vag-Spont          ROS: A ROS was performed and pertinent positives and negatives are included in the history.  GENERAL: No fevers or chills. HEENT: No change in vision, no earache, sore throat or sinus congestion. NECK: No pain or stiffness. CARDIOVASCULAR: No chest pain or pressure. No palpitations. PULMONARY: No shortness of breath, cough or wheeze. GASTROINTESTINAL: No abdominal pain, nausea, vomiting or diarrhea, melena or bright red blood per rectum. GENITOURINARY: No urinary frequency, urgency, hesitancy or dysuria. MUSCULOSKELETAL: No joint or muscle pain, no back pain, no recent trauma. DERMATOLOGIC: No rash, no itching, no lesions. ENDOCRINE: No polyuria, polydipsia, no heat or cold intolerance. No recent change in weight. HEMATOLOGICAL: No anemia or easy bruising or bleeding. NEUROLOGIC: No headache, seizures, numbness, tingling or weakness. PSYCHIATRIC: No depression, no loss of interest in normal activity or change in sleep pattern.     Exam: chaperone present  BP 130/70   Ht 5' 3.5" (1.613  m)   Wt 111 lb (50.3 kg)   BMI 19.35 kg/m   Body mass index is 19.35 kg/m.  General appearance : Well developed well nourished female. No acute distress HEENT: Eyes: no retinal hemorrhage or exudates,  Neck supple, trachea midline, no carotid bruits, no thyroidmegaly Lungs: Clear to auscultation, no rhonchi or wheezes, or rib retractions  Heart: Regular rate and rhythm, no murmurs or gallops Breast:Examined in sitting and supine position were symmetrical in  appearance, no palpable masses or tenderness,  no skin retraction, no nipple inversion, no nipple discharge, no skin discoloration, no axillary or supraclavicular lymphadenopathy Abdomen: no palpable masses or tenderness, no rebound or guarding Extremities: no edema or skin discoloration or tenderness  Pelvic:  Bartholin, Urethra, Skene Glands: Within normal limits             Vagina: No gross lesions or discharge, atrophic changes  Cervix: No gross lesions or discharge  Uterus  axial, normal size, shape and consistency, non-tender and mobile  Adnexa  Without masses or tenderness  Anus and perineum  normal   Rectovaginal  normal sphincter tone without palpated masses or tenderness             Hemoccult PCP provides     Assessment/Plan:  75 y.o. female for annual exam with history of osteoporosis on yearly IV zoledronic acid. Her next dose is in March and she will come by the office the end of February for a BUN, creatinine, calcium, vitamin D, and PTH level. Patient is due for her next bone density study in April of this year. We'll see the response to her treatment. We may want to consider going on a drug holiday in 2019 which will be 7 years since the start of the medication. Pap smear not indicated. PCP doing her blood work and her vaccines are up-to-date.   Terrance Mass MD, 9:07 AM 10/17/2016

## 2017-02-06 ENCOUNTER — Encounter: Payer: Self-pay | Admitting: Gynecology

## 2017-02-07 ENCOUNTER — Telehealth: Payer: Self-pay | Admitting: Gynecology

## 2017-02-07 DIAGNOSIS — M810 Age-related osteoporosis without current pathological fracture: Secondary | ICD-10-CM

## 2017-02-07 NOTE — Telephone Encounter (Signed)
Reclast infusion due after 02/22/17 M81.0. Complete exam 10/17/16 with JF. M81.0/ Insurance benefits. No deductible, OOPM $5500 ($41.21 met). 20 % co insurance medicare allowable=  $651 approx cost pt. $130 NO PA required. LM for pt to call me to schedule lab work and tell her insurance benefit.

## 2017-02-09 NOTE — Telephone Encounter (Addendum)
Labwork 02/17/17 at 11 am. For Reclast. Pt due after 02/22/17 , BUN, CALCIUM, CREATININE

## 2017-02-17 ENCOUNTER — Other Ambulatory Visit: Payer: Medicare Other

## 2017-02-17 DIAGNOSIS — M810 Age-related osteoporosis without current pathological fracture: Secondary | ICD-10-CM

## 2017-02-17 LAB — CALCIUM: Calcium: 9.6 mg/dL (ref 8.6–10.4)

## 2017-02-17 LAB — BUN: BUN: 11 mg/dL (ref 7–25)

## 2017-02-17 LAB — CREATININE, SERUM: Creat: 0.78 mg/dL (ref 0.60–0.93)

## 2017-02-21 NOTE — Telephone Encounter (Signed)
Labs for Reclast normal. She would like a Monday or Friday appointment mid morning.

## 2017-02-22 ENCOUNTER — Other Ambulatory Visit: Payer: Self-pay | Admitting: Gynecology

## 2017-02-22 NOTE — Telephone Encounter (Signed)
Labs for Reclast. I will send to Dr Toney Rakes. She also needs bone density based on notes from 09/2016 complete exam. I will ask JF if Reclast is before or after bone density. Results for Shannon [name], Shannon [name] (MRN [id number]) as of 02/22/2017 16:35  Ref. Range 02/17/2017 11:04  Creatinine Latest Ref Range: 0.60 - 0.93 mg/dL 0.78  Calcium Latest Ref Range: 8.6 - 10.4 mg/dL 9.6  Results for Shannon [name], Shannon [name] (MRN [id number]) as of 02/22/2017 16:35  Ref. Range 02/17/2017 11:04  BUN Latest Ref Range: 7 - 25 mg/dL 11

## 2017-02-22 NOTE — Telephone Encounter (Signed)
Rx called in 

## 2017-02-22 NOTE — Telephone Encounter (Signed)
rx called in

## 2017-02-23 ENCOUNTER — Telehealth: Payer: Self-pay | Admitting: *Deleted

## 2017-02-23 NOTE — Telephone Encounter (Signed)
-----   Message from Catha Brow sent at 02/22/2017  4:42 PM EDT ----- Regarding: bone density at [facility] This pt is for Reclast and based on JF note needs a bone density due in April. Could you send a order to [facility] for the bone density she saw JF in 09/2016 I cannot find a order.   Thanks

## 2017-02-23 NOTE — Telephone Encounter (Signed)
Order faxed to [facility]. 

## 2017-02-27 NOTE — Telephone Encounter (Signed)
Your note states bone density in April so Anderson Malta put order in at [place]. Pt is due for Reclast I did labs and she is ready. Can she have Reclast before the bone density. Her labs are only good for Reclast for 30 days end 03/19/17  Note to JF, his reply was Yes to Reclast. Appointment made on 03/13/2017 at 12 noon [facility]. PC to PT left VM order to [facility] for Bone Density by Anderson Malta.

## 2017-03-02 NOTE — Telephone Encounter (Addendum)
CONFIRMED RECLAST APPOINTMENT, next due 03/14/2018

## 2017-03-03 ENCOUNTER — Encounter: Payer: Self-pay | Admitting: Anesthesiology

## 2017-03-09 ENCOUNTER — Other Ambulatory Visit (HOSPITAL_COMMUNITY): Payer: Self-pay | Admitting: *Deleted

## 2017-03-13 ENCOUNTER — Ambulatory Visit (HOSPITAL_COMMUNITY)
Admission: RE | Admit: 2017-03-13 | Discharge: 2017-03-13 | Disposition: A | Discharge status: Home / Self Care | Payer: Medicare Other | Source: Ambulatory Visit | Attending: Gynecology | Admitting: Gynecology

## 2017-03-13 DIAGNOSIS — M810 Age-related osteoporosis without current pathological fracture: Secondary | ICD-10-CM | POA: Insufficient documentation

## 2017-03-13 MED ORDER — ZOLEDRONIC ACID 5 MG/100ML IV SOLN
INTRAVENOUS | Status: AC
  Filled 2017-03-13: qty 100

## 2017-03-13 MED ORDER — ZOLEDRONIC ACID 5 MG/100ML IV SOLN
5.0000 mg | Freq: Once | INTRAVENOUS | Status: AC
  Administered 2017-03-13: 5 mg via INTRAVENOUS

## 2017-03-15 ENCOUNTER — Encounter: Payer: Self-pay | Admitting: Gynecology

## 2017-08-11 ENCOUNTER — Other Ambulatory Visit: Payer: Self-pay

## 2017-08-11 MED ORDER — ALPRAZOLAM 1 MG PO TABS: ORAL_TABLET | ORAL | 0 refills | Status: DC

## 2017-08-11 NOTE — Telephone Encounter (Signed)
okay to refill 1

## 2017-08-11 NOTE — Telephone Encounter (Signed)
Called patient and left message to inform her [place] out of office on Th/Fri and it will be Monday when we get back with her.

## 2017-08-11 NOTE — Telephone Encounter (Signed)
Patient of Dr. Durenda Guthrie current on CE will be due in Dec.  I did not see documentation about her Xanax use so I called her.  She said she uses a 1/2 tab in the morning and a 1/2 tab hs every day.  Uses if for anxiety.

## 2017-08-11 NOTE — Telephone Encounter (Signed)
Would you consider refilling one time to give her time to see her PCP about filling this for her?

## 2017-08-11 NOTE — Telephone Encounter (Signed)
I think this is a better medication managed by her primary physician rather than her gynecologist in a 76 yo patient.

## 2017-08-11 NOTE — Telephone Encounter (Signed)
I called patient and explained that Dr. Loetta Rough not comfortable continuing this Rx for her and recommended PCP who handles her health issues would be better suited to prescribe this. I explained Dr. Loetta Rough was refilling it one time for her to give her one month to get in touch with PCP and make arrangements for future Rx needs. She verbalized understanding and said she would be in touch with her PCP.  Rx called to pharmacy.

## 2017-10-23 ENCOUNTER — Inpatient Hospital Stay (HOSPITAL_COMMUNITY): Payer: Medicare Other | Admitting: Certified Registered"

## 2017-10-23 ENCOUNTER — Inpatient Hospital Stay (HOSPITAL_COMMUNITY): Payer: Medicare Other

## 2017-10-23 ENCOUNTER — Encounter (HOSPITAL_COMMUNITY)
Admission: EM | Disposition: A | Discharge status: Skilled Nursing Facility | Payer: Self-pay | Source: Home / Self Care | Attending: Internal Medicine

## 2017-10-23 ENCOUNTER — Encounter (HOSPITAL_COMMUNITY): Payer: Self-pay | Admitting: Nurse Practitioner

## 2017-10-23 ENCOUNTER — Inpatient Hospital Stay (HOSPITAL_COMMUNITY)
Admission: EM | Admit: 2017-10-23 | Discharge: 2017-10-27 | DRG: 482 | Disposition: A | Discharge status: Skilled Nursing Facility | Payer: Medicare Other | Attending: Family Medicine | Admitting: Family Medicine

## 2017-10-23 ENCOUNTER — Other Ambulatory Visit: Payer: Self-pay

## 2017-10-23 ENCOUNTER — Emergency Department (HOSPITAL_COMMUNITY): Payer: Medicare Other

## 2017-10-23 DIAGNOSIS — W101XXA Fall (on)(from) sidewalk curb, initial encounter: Secondary | ICD-10-CM | POA: Diagnosis present

## 2017-10-23 DIAGNOSIS — Z419 Encounter for procedure for purposes other than remedying health state, unspecified: Secondary | ICD-10-CM

## 2017-10-23 DIAGNOSIS — E039 Hypothyroidism, unspecified: Secondary | ICD-10-CM | POA: Diagnosis not present

## 2017-10-23 DIAGNOSIS — M810 Age-related osteoporosis without current pathological fracture: Secondary | ICD-10-CM | POA: Diagnosis present

## 2017-10-23 DIAGNOSIS — S72009A Fracture of unspecified part of neck of unspecified femur, initial encounter for closed fracture: Secondary | ICD-10-CM | POA: Diagnosis present

## 2017-10-23 DIAGNOSIS — Z85850 Personal history of malignant neoplasm of thyroid: Secondary | ICD-10-CM

## 2017-10-23 DIAGNOSIS — Z85118 Personal history of other malignant neoplasm of bronchus and lung: Secondary | ICD-10-CM

## 2017-10-23 DIAGNOSIS — E3122 Multiple endocrine neoplasia [MEN] type IIA: Secondary | ICD-10-CM | POA: Diagnosis present

## 2017-10-23 DIAGNOSIS — Z87891 Personal history of nicotine dependence: Secondary | ICD-10-CM | POA: Diagnosis not present

## 2017-10-23 DIAGNOSIS — E559 Vitamin D deficiency, unspecified: Secondary | ICD-10-CM | POA: Diagnosis present

## 2017-10-23 DIAGNOSIS — R197 Diarrhea, unspecified: Secondary | ICD-10-CM | POA: Diagnosis present

## 2017-10-23 DIAGNOSIS — S72142A Displaced intertrochanteric fracture of left femur, initial encounter for closed fracture: Principal | ICD-10-CM | POA: Diagnosis present

## 2017-10-23 DIAGNOSIS — I4581 Long QT syndrome: Secondary | ICD-10-CM | POA: Diagnosis present

## 2017-10-23 DIAGNOSIS — Z961 Presence of intraocular lens: Secondary | ICD-10-CM | POA: Diagnosis present

## 2017-10-23 DIAGNOSIS — Z9842 Cataract extraction status, left eye: Secondary | ICD-10-CM | POA: Diagnosis not present

## 2017-10-23 DIAGNOSIS — E890 Postprocedural hypothyroidism: Secondary | ICD-10-CM | POA: Diagnosis present

## 2017-10-23 DIAGNOSIS — C73 Malignant neoplasm of thyroid gland: Secondary | ICD-10-CM

## 2017-10-23 DIAGNOSIS — Z9841 Cataract extraction status, right eye: Secondary | ICD-10-CM | POA: Diagnosis not present

## 2017-10-23 DIAGNOSIS — S72002A Fracture of unspecified part of neck of left femur, initial encounter for closed fracture: Secondary | ICD-10-CM | POA: Diagnosis not present

## 2017-10-23 DIAGNOSIS — Z853 Personal history of malignant neoplasm of breast: Secondary | ICD-10-CM

## 2017-10-23 DIAGNOSIS — Z85820 Personal history of malignant melanoma of skin: Secondary | ICD-10-CM | POA: Diagnosis not present

## 2017-10-23 DIAGNOSIS — F419 Anxiety disorder, unspecified: Secondary | ICD-10-CM | POA: Diagnosis present

## 2017-10-23 DIAGNOSIS — E070 Hypersecretion of calcitonin: Secondary | ICD-10-CM | POA: Diagnosis not present

## 2017-10-23 HISTORY — PX: FEMUR IM NAIL: SHX1597

## 2017-10-23 LAB — PROTIME-INR
INR: 1.02
Prothrombin Time: 13.3 seconds (ref 11.4–15.2)

## 2017-10-23 LAB — ABO/RH: ABO/RH(D): B POS

## 2017-10-23 LAB — CBC WITH DIFFERENTIAL/PLATELET
Basophils Absolute: 0 10*3/uL (ref 0.0–0.1)
Basophils Relative: 0 %
Eosinophils Absolute: 0 10*3/uL (ref 0.0–0.7)
Eosinophils Relative: 0 %
HEMATOCRIT: 38 % (ref 36.0–46.0)
HEMOGLOBIN: 12.9 g/dL (ref 12.0–15.0)
LYMPHS ABS: 0.7 10*3/uL (ref 0.7–4.0)
LYMPHS PCT: 6 %
MCH: 29.5 pg (ref 26.0–34.0)
MCHC: 33.9 g/dL (ref 30.0–36.0)
MCV: 87 fL (ref 78.0–100.0)
MONOS PCT: 5 %
Monocytes Absolute: 0.6 10*3/uL (ref 0.1–1.0)
NEUTROS ABS: 10.9 10*3/uL — AB (ref 1.7–7.7)
Neutrophils Relative %: 89 %
Platelets: 228 10*3/uL (ref 150–400)
RBC: 4.37 MIL/uL (ref 3.87–5.11)
RDW: 13.4 % (ref 11.5–15.5)
WBC: 12.3 10*3/uL — ABNORMAL HIGH (ref 4.0–10.5)

## 2017-10-23 LAB — BASIC METABOLIC PANEL
ANION GAP: 9 (ref 5–15)
BUN: 12 mg/dL (ref 6–20)
CALCIUM: 8.6 mg/dL — AB (ref 8.9–10.3)
CO2: 23 mmol/L (ref 22–32)
CREATININE: 0.85 mg/dL (ref 0.44–1.00)
Chloride: 110 mmol/L (ref 101–111)
GFR calc Af Amer: >60 mL/min (ref >60)
GLUCOSE: 117 mg/dL — AB (ref 65–99)
Potassium: 3.6 mmol/L (ref 3.5–5.1)
Sodium: 142 mmol/L (ref 135–145)

## 2017-10-23 LAB — TYPE AND SCREEN
ABO/RH(D): B POS
ANTIBODY SCREEN: NEGATIVE

## 2017-10-23 SURGERY — INSERTION, INTRAMEDULLARY ROD, FEMUR
Anesthesia: General | Site: Leg Upper | Laterality: Left

## 2017-10-23 MED ORDER — HYDROMORPHONE HCL 1 MG/ML IJ SOLN
0.2500 mg | INTRAMUSCULAR | Status: DC | PRN
  Administered 2017-10-23 (×2): 0.5 mg via INTRAVENOUS

## 2017-10-23 MED ORDER — SIMETHICONE 80 MG PO CHEW
80.0000 mg | CHEWABLE_TABLET | Freq: Three times a day (TID) | ORAL | Status: DC
  Administered 2017-10-24 – 2017-10-27 (×11): 80 mg via ORAL
  Filled 2017-10-23 (×11): qty 1

## 2017-10-23 MED ORDER — FENTANYL CITRATE (PF) 100 MCG/2ML IJ SOLN
100.0000 ug | Freq: Once | INTRAMUSCULAR | Status: AC
  Administered 2017-10-23: 100 ug via INTRAVENOUS
  Filled 2017-10-23: qty 2

## 2017-10-23 MED ORDER — METHOCARBAMOL 1000 MG/10ML IJ SOLN
500.0000 mg | Freq: Once | INTRAVENOUS | Status: AC
  Administered 2017-10-24: 500 mg via INTRAVENOUS
  Filled 2017-10-23 (×2): qty 5

## 2017-10-23 MED ORDER — LACTATED RINGERS IV SOLN
INTRAVENOUS | Status: DC | PRN
  Administered 2017-10-23 (×2): via INTRAVENOUS

## 2017-10-23 MED ORDER — LIDOCAINE 2% (20 MG/ML) 5 ML SYRINGE
INTRAMUSCULAR | Status: AC
  Filled 2017-10-23: qty 5

## 2017-10-23 MED ORDER — ACETAMINOPHEN 650 MG RE SUPP: 650.0000 mg | Freq: Four times a day (QID) | RECTAL | Status: DC | PRN

## 2017-10-23 MED ORDER — ONDANSETRON HCL 4 MG/2ML IJ SOLN
INTRAMUSCULAR | Status: DC | PRN
  Administered 2017-10-23: 4 mg via INTRAVENOUS

## 2017-10-23 MED ORDER — SUFENTANIL CITRATE 50 MCG/ML IV SOLN
INTRAVENOUS | Status: AC
  Filled 2017-10-23: qty 1

## 2017-10-23 MED ORDER — MIDAZOLAM HCL 5 MG/5ML IJ SOLN
INTRAMUSCULAR | Status: DC | PRN
  Administered 2017-10-23: 2 mg via INTRAVENOUS

## 2017-10-23 MED ORDER — SUCCINYLCHOLINE CHLORIDE 200 MG/10ML IV SOSY
PREFILLED_SYRINGE | INTRAVENOUS | Status: DC | PRN
  Administered 2017-10-23: 80 mg via INTRAVENOUS

## 2017-10-23 MED ORDER — LEVOTHYROXINE SODIUM 112 MCG PO TABS
112.0000 ug | ORAL_TABLET | Freq: Every day | ORAL | Status: DC
  Administered 2017-10-24 – 2017-10-27 (×4): 112 ug via ORAL
  Filled 2017-10-23 (×4): qty 1

## 2017-10-23 MED ORDER — LIDOCAINE 2% (20 MG/ML) 5 ML SYRINGE
INTRAMUSCULAR | Status: DC | PRN
  Administered 2017-10-23: 100 mg via INTRAVENOUS

## 2017-10-23 MED ORDER — PHENYLEPHRINE 40 MCG/ML (10ML) SYRINGE FOR IV PUSH (FOR BLOOD PRESSURE SUPPORT)
PREFILLED_SYRINGE | INTRAVENOUS | Status: AC
  Filled 2017-10-23: qty 10

## 2017-10-23 MED ORDER — SUFENTANIL CITRATE 50 MCG/ML IV SOLN
INTRAVENOUS | Status: DC | PRN
  Administered 2017-10-23 (×3): 10 ug via INTRAVENOUS

## 2017-10-23 MED ORDER — OXYCODONE HCL 5 MG/5ML PO SOLN: 5.0000 mg | Freq: Once | ORAL | Status: DC | PRN

## 2017-10-23 MED ORDER — ONDANSETRON HCL 4 MG/2ML IJ SOLN
INTRAMUSCULAR | Status: AC
  Filled 2017-10-23: qty 2

## 2017-10-23 MED ORDER — ACETAMINOPHEN 10 MG/ML IV SOLN
1000.0000 mg | Freq: Once | INTRAVENOUS | Status: AC
  Administered 2017-10-23: 1000 mg via INTRAVENOUS

## 2017-10-23 MED ORDER — HYDROMORPHONE HCL 1 MG/ML IJ SOLN
INTRAMUSCULAR | Status: AC
  Administered 2017-10-23: 0.5 mg via INTRAVENOUS
  Filled 2017-10-23: qty 1

## 2017-10-23 MED ORDER — SODIUM CHLORIDE 0.9 % IV SOLN
INTRAVENOUS | Status: DC
  Administered 2017-10-23: 16:00:00 via INTRAVENOUS

## 2017-10-23 MED ORDER — PROPOFOL 10 MG/ML IV BOLUS
INTRAVENOUS | Status: AC
  Filled 2017-10-23: qty 20

## 2017-10-23 MED ORDER — ONDANSETRON HCL 4 MG/2ML IJ SOLN: 4.0000 mg | Freq: Four times a day (QID) | INTRAMUSCULAR | Status: DC | PRN

## 2017-10-23 MED ORDER — MORPHINE SULFATE (PF) 4 MG/ML IV SOLN: 4.0000 mg | Freq: Once | INTRAVENOUS | Status: DC

## 2017-10-23 MED ORDER — PHENYLEPHRINE HCL 10 MG/ML IJ SOLN
INTRAMUSCULAR | Status: DC | PRN
  Administered 2017-10-23: 80 ug via INTRAVENOUS

## 2017-10-23 MED ORDER — OXYCODONE HCL 5 MG PO TABS: 5.0000 mg | ORAL_TABLET | ORAL | Status: DC | PRN

## 2017-10-23 MED ORDER — SUCCINYLCHOLINE CHLORIDE 200 MG/10ML IV SOSY
PREFILLED_SYRINGE | INTRAVENOUS | Status: AC
  Filled 2017-10-23: qty 10

## 2017-10-23 MED ORDER — MIDAZOLAM HCL 2 MG/2ML IJ SOLN
INTRAMUSCULAR | Status: AC
  Filled 2017-10-23: qty 2

## 2017-10-23 MED ORDER — SODIUM CHLORIDE 0.9 % IJ SOLN
INTRAMUSCULAR | Status: AC
  Filled 2017-10-23: qty 10

## 2017-10-23 MED ORDER — ACETAMINOPHEN 325 MG PO TABS
650.0000 mg | ORAL_TABLET | Freq: Four times a day (QID) | ORAL | Status: DC | PRN
  Administered 2017-10-24 (×2): 650 mg via ORAL
  Filled 2017-10-23 (×3): qty 2

## 2017-10-23 MED ORDER — SODIUM CHLORIDE 0.9 % IV SOLN
250.0000 mL | INTRAVENOUS | Status: DC | PRN
Start: 2017-10-23 — End: 2017-10-24

## 2017-10-23 MED ORDER — PROPOFOL 10 MG/ML IV BOLUS
INTRAVENOUS | Status: DC | PRN
  Administered 2017-10-23: 90 mg via INTRAVENOUS
  Administered 2017-10-23: 40 mg via INTRAVENOUS

## 2017-10-23 MED ORDER — ALPRAZOLAM 0.5 MG PO TABS
0.5000 mg | ORAL_TABLET | Freq: Two times a day (BID) | ORAL | Status: DC
  Administered 2017-10-24 – 2017-10-27 (×8): 0.5 mg via ORAL
  Filled 2017-10-23 (×8): qty 1

## 2017-10-23 MED ORDER — DEXAMETHASONE SODIUM PHOSPHATE 10 MG/ML IJ SOLN
INTRAMUSCULAR | Status: AC
  Filled 2017-10-23: qty 1

## 2017-10-23 MED ORDER — FENTANYL CITRATE (PF) 100 MCG/2ML IJ SOLN
INTRAMUSCULAR | Status: AC
  Administered 2017-10-23: 50 ug via INTRAVENOUS
  Filled 2017-10-23: qty 2

## 2017-10-23 MED ORDER — LOPERAMIDE HCL 2 MG PO CAPS
4.0000 mg | ORAL_CAPSULE | Freq: Three times a day (TID) | ORAL | Status: DC
  Administered 2017-10-24 – 2017-10-27 (×9): 4 mg via ORAL
  Filled 2017-10-23 (×10): qty 2

## 2017-10-23 MED ORDER — CEFAZOLIN SODIUM-DEXTROSE 2-3 GM-%(50ML) IV SOLR
INTRAVENOUS | Status: DC | PRN
  Administered 2017-10-23: 2 g via INTRAVENOUS

## 2017-10-23 MED ORDER — SODIUM CHLORIDE 0.9% FLUSH: 3.0000 mL | Freq: Two times a day (BID) | INTRAVENOUS | Status: DC

## 2017-10-23 MED ORDER — SODIUM CHLORIDE 0.9% FLUSH: 3.0000 mL | INTRAVENOUS | Status: DC | PRN

## 2017-10-23 MED ORDER — OXYCODONE HCL 5 MG PO TABS: 5.0000 mg | ORAL_TABLET | Freq: Once | ORAL | Status: DC | PRN

## 2017-10-23 MED ORDER — FENTANYL CITRATE (PF) 100 MCG/2ML IJ SOLN
25.0000 ug | INTRAMUSCULAR | Status: DC | PRN
  Administered 2017-10-23 (×2): 50 ug via INTRAVENOUS

## 2017-10-23 MED ORDER — DEXAMETHASONE SODIUM PHOSPHATE 10 MG/ML IJ SOLN
INTRAMUSCULAR | Status: DC | PRN
Start: 2017-10-23 — End: 2017-10-23
  Administered 2017-10-23: 10 mg via INTRAVENOUS

## 2017-10-23 MED ORDER — ACETAMINOPHEN 10 MG/ML IV SOLN
INTRAVENOUS | Status: AC
  Filled 2017-10-23: qty 100

## 2017-10-23 SURGICAL SUPPLY — 45 items
BIT DRILL CANN LG 4.3MM (BIT) IMPLANT
COVER PERINEAL POST (MISCELLANEOUS) ×2 IMPLANT
COVER SURGICAL LIGHT HANDLE (MISCELLANEOUS) ×1 IMPLANT
DRAPE STERI IOBAN 125X83 (DRAPES) ×2 IMPLANT
DRAPE SURG 17X23 STRL (DRAPES) ×2 IMPLANT
DRILL BIT CANN LG 4.3MM (BIT) ×2
DRSG ADAPTIC 3X8 NADH LF (GAUZE/BANDAGES/DRESSINGS) ×1 IMPLANT
DRSG MEPILEX BORDER 4X12 (GAUZE/BANDAGES/DRESSINGS) ×1 IMPLANT
DRSG MEPILEX BORDER 4X4 (GAUZE/BANDAGES/DRESSINGS) ×2 IMPLANT
DRSG MEPILEX BORDER 4X8 (GAUZE/BANDAGES/DRESSINGS) IMPLANT
DURAPREP 26ML APPLICATOR (WOUND CARE) ×2 IMPLANT
ELECT REM PT RETURN 9FT ADLT (ELECTROSURGICAL) ×2
ELECTRODE REM PT RTRN 9FT ADLT (ELECTROSURGICAL) ×1 IMPLANT
EVACUATOR 1/8 PVC DRAIN (DRAIN) IMPLANT
GAUZE SPONGE 4X4 12PLY STRL (GAUZE/BANDAGES/DRESSINGS) ×2 IMPLANT
GLOVE BIOGEL PI IND STRL 7.5 (GLOVE) ×1 IMPLANT
GLOVE BIOGEL PI IND STRL 8 (GLOVE) ×1 IMPLANT
GLOVE BIOGEL PI INDICATOR 7.5 (GLOVE) ×1
GLOVE BIOGEL PI INDICATOR 8 (GLOVE) ×1
GLOVE ORTHO TXT STRL SZ7.5 (GLOVE) ×2 IMPLANT
GLOVE SURG ORTHO 8.0 STRL STRW (GLOVE) ×2 IMPLANT
GOWN STRL REUS W/ TWL LRG LVL3 (GOWN DISPOSABLE) ×3 IMPLANT
GOWN STRL REUS W/TWL LRG LVL3 (GOWN DISPOSABLE) ×6
GUIDEPIN 3.2X17.5 THRD DISP (PIN) ×1 IMPLANT
HFN 125 DEG 9MM X 180MM (Nail) ×1 IMPLANT
HIP FRA NAIL LAG SCREW 10.5X90 (Orthopedic Implant) ×2 IMPLANT
KIT BASIN OR (CUSTOM PROCEDURE TRAY) ×2 IMPLANT
KIT ROOM TURNOVER OR (KITS) ×2 IMPLANT
LINER BOOT UNIVERSAL DISP (MISCELLANEOUS) ×2 IMPLANT
MANIFOLD NEPTUNE II (INSTRUMENTS) ×2 IMPLANT
NS IRRIG 1000ML POUR BTL (IV SOLUTION) ×2 IMPLANT
PACK GENERAL/GYN (CUSTOM PROCEDURE TRAY) ×2 IMPLANT
PAD ARMBOARD 7.5X6 YLW CONV (MISCELLANEOUS) ×4 IMPLANT
SCREW BONE CORTICAL 5.0X32 (Screw) ×1 IMPLANT
SCREW LAG HIP FRA NAIL 10.5X90 (Orthopedic Implant) IMPLANT
STAPLER VISISTAT 35W (STAPLE) ×3 IMPLANT
SUT VIC AB 0 CT1 27 (SUTURE) ×4
SUT VIC AB 0 CT1 27XBRD ANBCTR (SUTURE) ×2 IMPLANT
SUT VIC AB 1 CT1 27 (SUTURE) ×2
SUT VIC AB 1 CT1 27XBRD ANBCTR (SUTURE) ×1 IMPLANT
SUT VIC AB 2-0 CT1 27 (SUTURE) ×4
SUT VIC AB 2-0 CT1 TAPERPNT 27 (SUTURE) ×1 IMPLANT
TOWEL OR 17X24 6PK STRL BLUE (TOWEL DISPOSABLE) ×2 IMPLANT
TOWEL OR 17X26 10 PK STRL BLUE (TOWEL DISPOSABLE) ×2 IMPLANT
WATER STERILE IRR 1000ML POUR (IV SOLUTION) ×2 IMPLANT

## 2017-10-23 NOTE — H&P (Addendum)
History and Physical    Shannon [name]:[id number] DOB: October 24, 1941 DOA: 10/23/2017    PCP: Aletha Halim., PA-C  Patient coming from: home  Chief Complaint: fall and pain  HPI: Shannon [name] is a 76 y.o. female with medical history of breast CA, medullary cancer of the thryoid and anxiety who tripped and fell today and then noted pain in her left hip which is worse with movement. She rates it an 8/10, mainly present in her hip and upper left thigh without radiation. Improved with IV pain medication in the ER. She has no other complaints.   ED Course:  Xray> left intertrochanteric fracture  Review of Systems:  All other systems reviewed and apart from HPI, are negative.  Past Medical History:  Diagnosis Date  . Anxiety   . Breast cancer ([place]) 1999   left lumpectomy/radiation/ductal carcinoma in situ  . Cancer ([place])   . Colon polyp   . Contact lens/glasses fitting    HAS LENS IMPLANTS  . Lung cancer ([place]) 2015   LYMPHNODE REMOVAL   . 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 Osteoporosis     Past Surgical History:  Procedure Laterality Date  . ABDOMINAL SURGERY  1989   RUPTURED APPENDIX  . BREAST SURGERY  1999   LEFT BREAST LUMPECTOMY. DUCTAL CARCINOMA IN SITU./ RADIATION  . CATARACT EXTRACTION W/ INTRAOCULAR LENS IMPLANT  2009   BOTH EYES  . FOOT SURGERY  1070   RIGHT FOOT - PINCHED NERVE  . LUNG SURGERY     CALCIFICATIONS, POSITIVE - CANCER LYMPHNODE   . MELANOMA EXCISION  2008   CHEST AREA  . NECK SURGERY  Nov 09 2011   LYMPHNODES REMOVED 2 POSITIVE   . RIGHT COLECTOMY  06/02/2009  . THYROIDECTOMY  11/06/2009  . TONSILLECTOMY AND ADENOIDECTOMY      Social History:   reports that she quit smoking about 57 years ago. she has never used smokeless tobacco. She reports that she does not drink alcohol or use drugs.  Allergies  Allergen Reactions  . Codeine     Family History  Problem Relation Age of Onset  . Cancer Father        throat/ bone  .  Breast cancer Sister   . Cancer Sister        MEDULAR CANCER  . Cancer Brother        prostate  . Cancer Other        MEDULAR CANCER  . Breast cancer Maternal Aunt   . Cancer Paternal Aunt        pancreatic   . Cancer Cousin        ON MOTHER'S SIDE- LIVER CANCER     Prior to Admission medications   Medication Sig Start Date End Date Taking? Authorizing Provider  ALPRAZolam Duanne Moron) 1 MG tablet Take 1/2 tab in the am and 1/2 tab hs prn. (per patient) Patient taking differently: Take 0.5 mg by mouth 2 (two) times daily. Take 1/2 tab in the am and 1/2 tab hs prn. (per patient) 08/11/17  Yes Fontaine, Belinda Block, MD  Biotin 1000 MCG tablet Take 1,000 mcg by mouth daily.    Yes [provider]  Calcium Carbonate-Vit D-Min (CALCIUM 600 + MINERALS PO) Take 1 tablet by mouth daily.    Yes [provider]  cetirizine (ZYRTEC) 10 MG tablet Take 10 mg by mouth daily.   Yes [provider]  Cholecalciferol (VITAMIN D) 1000 UNITS capsule Take 1,000  Units by mouth daily.     Yes [provider]  Cod Liver Oil 1000 MG CAPS Take 1,000 mg by mouth daily.    Yes [provider]  Diphenhydramine-APAP, sleep, (TYLENOL PM EXTRA STRENGTH PO) Take 1 tablet by mouth at bedtime.    Yes [provider]  Glucosamine-Chondroitin 250-200 MG CAPS Take 1 tablet by mouth daily. 12/23/10  Yes [provider]  levothyroxine (SYNTHROID, LEVOTHROID) 112 MCG tablet Take 112 mcg by mouth daily. /125MCG   Yes [provider]  loperamide (IMODIUM A-D) 2 MG tablet Take 2 mg by mouth 3 (three) times daily.   Yes [provider]  Multiple Vitamin (MULTIVITAMIN) tablet Take 1 tablet by mouth daily.     Yes [provider]  PRESCRIPTION MEDICATION Inject 1 application into the skin every 30 (thirty) days. To control diarrhea   Yes [provider]  simethicone (MYLICON) 80 MG chewable tablet Chew 80 mg by mouth 3 (three) times daily.     Yes [provider]  zoledronic acid (RECLAST) 5 MG/100ML SOLN Inject 5 mg into the vein once.     Yes [provider]    Physical Exam: Wt Readings from Last 3 Encounters:  10/23/17 46.3 kg (102 lb)  03/13/17 48.1 kg (106 lb)  10/17/16 50.3 kg (111 lb)   Vitals:   10/23/17 1322 10/23/17 1325 10/23/17 1326  BP:  133/60   Pulse:  (!) 104   Resp:  18   Temp:  98.4 F (36.9 C)   TempSrc:  Oral   SpO2: 99% 100%   Weight:   46.3 kg (102 lb)  Height:   5\' 3"  (1.6 m)      Constitutional: NAD, calm, comfortable Eyes: PERTLA, lids and conjunctivae normal ENMT: Mucous membranes are moist. Posterior pharynx clear of any exudate or lesions. Normal dentition.  Neck: normal, supple, no masses, no thyromegaly Respiratory: clear to auscultation bilaterally, no wheezing, no crackles. Normal respiratory effort. No accessory muscle use.  Cardiovascular: S1 & S2 heard, regular rate and rhythm, no murmurs / rubs / gallops. No extremity edema. 2+ pedal pulses. No carotid bruits.  Abdomen: No distension, no tenderness, no masses palpated. No hepatosplenomegaly. Bowel sounds normal.  Musculoskeletal: no clubbing / cyanosis. No joint deformity upper and lower extremities. Good ROM, no contractures. Normal muscle tone.  Skin: no rashes, lesions, ulcers. No induration Neurologic: CN 2-12 grossly intact. Sensation intact, DTR normal. Strength 5/5 in all 4 limbs.  Psychiatric: Normal judgment and insight. Alert and oriented x 3. Normal mood.     Labs on Admission: I have personally reviewed following labs and imaging studies  CBC: Recent Labs  Lab 10/23/17 1518  WBC 12.3*  NEUTROABS 10.9*  HGB 12.9  HCT 38.0  MCV 87.0  PLT 756   Basic Metabolic Panel: Recent Labs  Lab 10/23/17 1518  NA 142  K 3.6  CL 110  CO2 23  GLUCOSE 117*  BUN 12  CREATININE 0.85  CALCIUM 8.6*   GFR: Estimated Creatinine Clearance: 41.2 mL/min (by C-G formula based on SCr of 0.85  mg/dL). Liver Function Tests: No results for input(s): AST, ALT, ALKPHOS, BILITOT, PROT, ALBUMIN in the last 168 hours. No results for input(s): LIPASE, AMYLASE in the last 168 hours. No results for input(s): AMMONIA in the last 168 hours. Coagulation Profile: Recent Labs  Lab 10/23/17 1518  INR 1.02   Cardiac Enzymes: No results for input(s): CKTOTAL, CKMB, CKMBINDEX, TROPONINI in the last 168  hours. BNP (last 3 results) No results for input(s): PROBNP in the last 8760 hours. HbA1C: No results for input(s): HGBA1C in the last 72 hours. CBG: No results for input(s): GLUCAP in the last 168 hours. Lipid Profile: No results for input(s): CHOL, HDL, LDLCALC, TRIG, CHOLHDL, LDLDIRECT in the last 72 hours. Thyroid Function Tests: No results for input(s): TSH, T4TOTAL, FREET4, T3FREE, THYROIDAB in the last 72 hours. Anemia Panel: No results for input(s): VITAMINB12, FOLATE, FERRITIN, TIBC, IRON, RETICCTPCT in the last 72 hours. Urine analysis:    Component Value Date/Time   COLORURINE YELLOW 11/05/2009 0924   APPEARANCEUR CLEAR 11/05/2009 0924   LABSPEC 1.017 11/05/2009 0924   PHURINE 6.0 11/05/2009 0924   GLUCOSEU NEGATIVE 11/05/2009 0924   HGBUR NEGATIVE 11/05/2009 0924   BILIRUBINUR NEGATIVE 11/05/2009 0924   KETONESUR NEGATIVE 11/05/2009 0924   PROTEINUR NEGATIVE 11/05/2009 0924   UROBILINOGEN 0.2 11/05/2009 0924   NITRITE NEGATIVE 11/05/2009 0924   LEUKOCYTESUR TRACE (A) 11/05/2009 0924   Sepsis Labs: @LABRCNTIP (procalcitonin:4,lacticidven:4) )No results found for this or any previous visit (from the past 240 hour(s)).   Radiological Exams on Admission: Dg Hip Unilat With Pelvis 2-3 Views Left  Result Date: 10/23/2017 CLINICAL DATA:  Left hip pain after a fall today. EXAM: DG HIP (WITH OR WITHOUT PELVIS) 2-3V LEFT COMPARISON:  None. FINDINGS: There is a comminuted slightly impacted intertrochanteric fracture of the proximal left femur. The fracture extends into the  proximal left femoral shaft. Osteopenia. IMPRESSION: Comminuted slightly impacted intertrochanteric fracture of the proximal left femur extending into the proximal shaft. Electronically Signed   By: Lorriane Shire M.D.   On: 10/23/2017 14:27    EKG: Independently reviewed. Sinus tachycardia. 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

## 2017-10-23 NOTE — Op Note (Signed)
Shannon [name], HOEL NO.:  [id number]  MEDICAL RECORD NO.:  [id number]  LOCATION:  [id number]                        FACILITY:  [facility]  PHYSICIAN:  Pietro Cassis. Alvan Dame, M.D.  DATE OF BIRTH:  1941/06/23  DATE OF PROCEDURE:  10/23/2017 DATE OF DISCHARGE:                              OPERATIVE REPORT   PREOPERATIVE DIAGNOSIS:  Left intertrochanteric femur fracture.  POSTOPERATIVE DIAGNOSIS:  Left peritrochanteric proximal femur fracture.  PROCEDURE:  Open reduction and internal fixation of left peritrochanteric femur fracture utilizing a Biomet Affixus nail 9 x 180 mm with a 125-degree lag screw and a distal interlock.  I did lock the proximal lag screw to make this a fixed angle device to prevent any malunion.  SURGEON:  Pietro Cassis. Alvan Dame, MD.  ASSISTANT:  Surgical team.  ANESTHESIA:  General.  SPECIMENS:  None.  COMPLICATIONS:  None.  BLOOD LOSS:  Less than 50 mL.  INDICATIONS FOR PROCEDURE:  [name] is a pleasant 76 year old female, who unfortunately had a ground level fall while at home.  She had immediate onset of pain and inability to bear weight.  She was brought to the emergency room where radiographs revealed an intertrochanteric femur fracture.  She was subsequently admitted to the [facility] per routine and Orthopedics consulted for management. We reviewed the risks, benefits, and necessity of the procedure. Specific risks of infection, DVT, hardware failure, need for future surgery due to malunion and nonunion were discussed.  Consent was obtained for benefit of fracture management.  PROCEDURE IN DETAIL:  The patient was brought to the operative theater. Once adequate anesthesia, preoperative antibiotics, Ancef administered, she was positioned supine on the Hana table.  Her left foot was placed in a traction boot.  The right leg was flexed and abducted over leg holder with bony prominence protected particularly over the peroneal nerve.   The perineal post was placed.  Once we were comfortable with the safety and adequacy of the padding and protection of her soft tissues, traction was applied across the perineal post with slight internal rotation.  Fluoroscopy was used to confirm a near anatomic reduction.  At this point, the left lower extremity was prepped and draped in a sterile fashion.  A time-out was performed identifying the patient, planned procedure, and extremity with a shower curtain technique. Fluoroscopy was brought back into the field.  An incision was made proximal to the trochanter.  The skin incision was carried down to the fascia of the gluteus, which was then incised.  A guidewire was inserted into the tip of the trochanter and passed across the fracture site, confirmed radiographically.  I then used a starting drill and opened up the proximal femur.  I selected a 9-mm nail and passed it by hand.  When we did radiographs at this point, it revealed that she did have a more of a peritrochanteric fracture with extension of the lateral cortex.  I elected to continue with the same device, but we decided to lock it as a fixed angle device as opposed to allowing for compression or displacement.  For the displacement, once I had the nail at its appropriate place and with the jig,  I placed a guidewire into the center of the femoral head in AP and lateral planes, confirmed radiographically.  We used a 90-mm lag screw.  It was placed after drilling into the femoral head and then had good purchase within the subchondral bone.  Again, I locked this down with a fixed angle device.  I then placed a distal interlock.  The jig was removed.  Final radiographs were obtained in AP and lateral planes.  We irrigated the wounds.  I reapproximated the gluteal fascia with #1 Vicryl.  The remainder of the wounds was closed with 2-0 Vicryl and staples on the skin.  The skin was cleaned, dried, and dressed sterilely with  long Mepilex dressing.  She was then awoken from anesthesia and brought to the recovery room in stable condition, tolerating the procedure well.  Postoperatively, she will be admitted to the Hospitalist Service.  I will have her be partial weightbearing until fracture heal in probably 6-8 weeks.     Pietro Cassis Alvan Dame, M.D.     MDO/MEDQ  D:  10/23/2017  T:  10/23/2017  Job:  [id number]

## 2017-10-23 NOTE — ED Provider Notes (Signed)
[facility] EMERGENCY DEPARTMENT Provider Note   CSN: [id number] Arrival date & time: 10/23/17  1322     History   Chief Complaint Chief Complaint  Patient presents with  . Fall    HPI Shannon [name] is a 76 y.o. female.  Tripped and fell on a curb immediately prior to coming here injuring left hip.  Complains of left hip pain which is nonradiating.  Pain is worse with moving her hip and improved with remaining still.  She denies other injury.  She states that she could not get up off the ground after falling.  Brought by EMS.  EMS treated patient with pelvic binder  HPI  Past Medical History:  Diagnosis Date  . Anxiety   . 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 Cancer ([place])   . Colon polyp   . Contact lens/glasses fitting    HAS LENS IMPLANTS  . Lung cancer ([place]) 2015   LYMPHNODE REMOVAL   . 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 Osteoporosis     Patient Active Problem List   Diagnosis Date Noted  . Hydrosalpinx 12/23/2015  . 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 Bloating symptom 08/17/2012  . Osteoporosis 08/11/2011  . History of breast cancer in female 08/11/2011  . Hypothyroidism 08/11/2011  . Vitamin D deficiency 08/11/2011  . Thyroid cancer ([place]) 08/11/2011  . History of colonic polyps 08/11/2011    Past Surgical History:  Procedure Laterality Date  . ABDOMINAL SURGERY  1989   RUPTURED APPENDIX  . BREAST SURGERY  1999   LEFT BREAST LUMPECTOMY. DUCTAL CARCINOMA IN SITU./ RADIATION  . CATARACT EXTRACTION W/ INTRAOCULAR LENS IMPLANT  2009   BOTH EYES  . FOOT SURGERY  1070   RIGHT FOOT - PINCHED NERVE  . LUNG SURGERY     CALCIFICATIONS, POSITIVE - CANCER LYMPHNODE   . MELANOMA EXCISION  2008   CHEST AREA  . NECK SURGERY  Nov 09 2011   LYMPHNODES REMOVED 2 POSITIVE   . RIGHT COLECTOMY  06/02/2009  . THYROIDECTOMY  11/06/2009  . TONSILLECTOMY AND ADENOIDECTOMY      OB History      Gravida Para Term Preterm AB Living   2 2       2    SAB TAB Ectopic Multiple Live Births                   Home Medications    Prior to Admission medications   Medication Sig Start Date End Date Taking? Authorizing Provider  ALPRAZolam Duanne Moron) 1 MG tablet Take 1/2 tab in the am and 1/2 tab hs prn. (per patient) 08/11/17   Fontaine, Belinda Block, MD  aspirin 81 MG tablet Take 81 mg by mouth daily.      [provider]  Biotin 1000 MCG tablet Take 1,000 mcg by mouth 3 (three) times daily.      [provider]  caffeine 200 MG TABS Take 200 mg by mouth every 4 (four) hours as needed.    [provider]  Calcium Carbonate-Vit D-Min (CALCIUM 600 + MINERALS PO) Take by mouth.      [provider]  Calcium Carbonate-Vitamin D (CALTRATE 600+D) 600-400 MG-UNIT per tablet Take 1 tablet by mouth daily.      [provider]  cetirizine (ZYRTEC) 10 MG tablet Take 10 mg by mouth daily.    [provider]  Cholecalciferol (VITAMIN D) 1000 UNITS  capsule Take 1,000 Units by mouth daily.      [provider]  Cod Liver Oil 1000 MG CAPS Take by mouth.      [provider]  Diphenhydramine-APAP, sleep, (TYLENOL PM EXTRA STRENGTH PO) Take by mouth.      [provider]  glucosamine-chondroitin 500-400 MG tablet Take 1 tablet by mouth 3 (three) times daily.      [provider]  levothyroxine (SYNTHROID, LEVOTHROID) 112 MCG tablet Take 112 mcg by mouth daily. /125MCG    [provider]  levothyroxine (SYNTHROID, LEVOTHROID) 150 MCG tablet Take 112 mcg by mouth daily. TAKES 125MCG EVERY OTHER DAY.Marland Kitchen AND 112 MCG    [provider]  Multiple Vitamin (MULTIVITAMIN) tablet Take 1 tablet by mouth daily.      [provider]  simethicone (MYLICON) 80 MG chewable tablet Chew 80 mg by mouth every 6 (six) hours as needed.    [provider]  zoledronic acid (RECLAST) 5 MG/100ML SOLN Inject 5 mg  into the vein once.      [provider]    Family History Family History  Problem Relation Age of Onset  . Cancer Father        throat/ bone  . Breast cancer Sister   . Cancer Sister        MEDULAR CANCER  . Cancer Brother        prostate  . Cancer Other        MEDULAR CANCER  . Breast cancer Maternal Aunt   . Cancer Paternal Aunt        pancreatic   . Cancer Cousin        ON MOTHER'S SIDE- LIVER CANCER    Social History Social History   Tobacco Use  . Smoking status: Former Smoker    Last attempt to quit: 08/10/1960    Years since quitting: 57.2  . Smokeless tobacco: Never Used  Substance Use Topics  . Alcohol use: No    Alcohol/week: 0.0 oz  . Drug use: No     Allergies   Codeine   Review of Systems Review of Systems  Constitutional: Negative.   HENT: Negative.   Respiratory: Negative.   Cardiovascular: Negative.   Gastrointestinal: Negative.   Musculoskeletal: Positive for arthralgias.       Left hip pain  Skin: Negative.   Neurological: Negative.   Psychiatric/Behavioral: Negative.   All other systems reviewed and are negative.    Physical Exam Updated Vital Signs BP 133/60 (BP Location: Right Arm)   Pulse (!) 104   Temp 98.4 F (36.9 C) (Oral)   Resp 18   Ht 5\' 3"  (1.6 m)   Wt 46.3 kg (102 lb)   SpO2 100%   BMI 18.07 kg/m   Physical Exam  Constitutional: She appears well-developed and well-nourished.  Alert Glasgow Coma Score 15  HENT:  Head: Normocephalic and atraumatic.  Eyes: Conjunctivae are normal. Pupils are equal, round, and reactive to light.  Neck: Neck supple. No tracheal deviation present. No thyromegaly present.  Cardiovascular: Normal rate and regular rhythm.  No murmur heard. Pulmonary/Chest: Effort normal and breath sounds normal.  Abdominal: Soft. Bowel sounds are normal. She exhibits no distension. There is no tenderness.  Musculoskeletal: Normal range of motion. She exhibits no edema or tenderness.   Spine is nontender.  Pelvis stable.  Left lower extremity no deformity.  She is tender overlying hip.  She has pain at hip on internal or external rotation  of the thigh.  DP pulse 2+ good capillary refill.  All other extremities without contusion abrasion or tenderness neurovascularly intact  Neurological: She is alert. Coordination normal.  Skin: Skin is warm and dry. No rash noted.  Psychiatric: She has a normal mood and affect.  Nursing note and vitals reviewed.    ED Treatments / Results  Labs (all labs ordered are listed, but only abnormal results are displayed) Labs Reviewed - No data to display  EKG  EKG Interpretation None     ED ECG REPORT   Date: 10/23/2017  Rate: 115  Rhythm: sinus tachycardia  QRS Axis: normal  Intervals: QT prolonged  ST/T Wave abnormalities: nonspecific T wave changes  Conduction Disutrbances:none  Narrative Interpretation:   Old EKG Reviewed: none available  I have personally reviewed the EKG tracing and agree with the computerized printout as noted.  Radiology No results found.  Procedures Procedures (including critical care time)  Medications Ordered in ED Medications  fentaNYL (SUBLIMAZE) injection 100 mcg (not administered)   Results for orders placed or performed during the hospital encounter of 16/10/96  Basic metabolic panel  Result Value Ref Range   Sodium 142 135 - 145 mmol/L   Potassium 3.6 3.5 - 5.1 mmol/L   Chloride 110 101 - 111 mmol/L   CO2 23 22 - 32 mmol/L   Glucose, Bld 117 (H) 65 - 99 mg/dL   BUN 12 6 - 20 mg/dL   Creatinine, Ser 0.85 0.44 - 1.00 mg/dL   Calcium 8.6 (L) 8.9 - 10.3 mg/dL   GFR calc non Af Amer >60 >60 mL/min   GFR calc Af Amer >60 >60 mL/min   Anion gap 9 5 - 15  CBC WITH DIFFERENTIAL  Result Value Ref Range   WBC 12.3 (H) 4.0 - 10.5 K/uL   RBC 4.37 3.87 - 5.11 MIL/uL   Hemoglobin 12.9 12.0 - 15.0 g/dL   HCT 38.0 36.0 - 46.0 %   MCV 87.0 78.0 - 100.0 fL   MCH 29.5 26.0 - 34.0 pg   MCHC  33.9 30.0 - 36.0 g/dL   RDW 13.4 11.5 - 15.5 %   Platelets 228 150 - 400 K/uL   Neutrophils Relative % 89 %   Neutro Abs 10.9 (H) 1.7 - 7.7 K/uL   Lymphocytes Relative 6 %   Lymphs Abs 0.7 0.7 - 4.0 K/uL   Monocytes Relative 5 %   Monocytes Absolute 0.6 0.1 - 1.0 K/uL   Eosinophils Relative 0 %   Eosinophils Absolute 0.0 0.0 - 0.7 K/uL   Basophils Relative 0 %   Basophils Absolute 0.0 0.0 - 0.1 K/uL  Protime-INR  Result Value Ref Range   Prothrombin Time 13.3 11.4 - 15.2 seconds   INR 1.02   Type and screen [place]  Result Value Ref Range   ABO/RH(D) B POS    Antibody Screen NEG    Sample Expiration 10/26/2017    Dg Hip Unilat With Pelvis 2-3 Views Left  Result Date: 10/23/2017 CLINICAL DATA:  Left hip pain after a fall today. EXAM: DG HIP (WITH OR WITHOUT PELVIS) 2-3V LEFT COMPARISON:  None. FINDINGS: There is a comminuted slightly impacted intertrochanteric fracture of the proximal left femur. The fracture extends into the proximal left femoral shaft. Osteopenia. IMPRESSION: Comminuted slightly impacted intertrochanteric fracture of the proximal left femur extending into the proximal shaft. Electronically Signed   By: Lorriane Shire M.D.   On: 10/23/2017 14:27  xrays viewed by me  Initial Impression / Assessment and Plan / ED Course  I have reviewed the triage vital signs and the nursing notes.  Pertinent labs & imaging results that were available during my care of the patient were reviewed by me and considered in my medical decision making (see chart for details).     2-30 p.m. pain is controlled after treatment with intravenous fentanyl.  At 4:10 PM requesting more pain medicine.  Additional IV fentanyl ordered.  I consulted Dr. Alvan Dame from orthopedist who requested patient be kept n.p.o. he will operate on patient either today or tomorrow. Hospitalist consulted for admission. Final Clinical Impressions(s) / ED Diagnoses  Diagnoses #1 left  intertrochanteric hip fracture #2 fall Final diagnoses:  None    ED Discharge Orders    None       Orlie Dakin, MD 10/23/17 1642

## 2017-10-23 NOTE — ED Notes (Signed)
Returned from xray

## 2017-10-23 NOTE — Transfer of Care (Signed)
Immediate Anesthesia Transfer of Care Note  Patient: Shannon [name]  Procedure(s) Performed: INTRAMEDULLARY (IM) NAIL FEMORAL (Left Leg Upper)  Patient Location: PACU  Anesthesia Type:General  Level of Consciousness: oriented, drowsy, patient cooperative and responds to stimulation  Airway & Oxygen Therapy: Patient Spontanous Breathing and Patient connected to nasal cannula oxygen  Post-op Assessment: Report given to RN, Post -op Vital signs reviewed and stable and Patient moving all extremities X 4  Post vital signs: Reviewed and stable  Last Vitals:  Vitals:   10/23/17 2256 10/23/17 2257  BP: 129/85   Pulse:  (!) 101  Resp:  (!) 21  Temp:    SpO2:  97%    Last Pain:  Vitals:   10/23/17 1937  TempSrc: Oral  PainSc:          Complications: No apparent anesthesia complications

## 2017-10-23 NOTE — Consult Note (Signed)
Reason for Consult: Left hip fracture Referring Physician:  Wynelle Cleveland, MD (Hospitalist)  Shannon [name] is an 76 y.o. female.  HPI: Shannon [name] is a 76 y.o. female with medical history of breast CA, medullary cancer of the thryoid and anxiety who tripped and fell today and then noted pain in her left hip which is worse with movement. She rates it an 8/10, mainly present in her hip and upper left thigh without radiation. Improved with IV pain medication in the ER. She has no other complaints  In room with her sister and brother    Past Medical History:  Diagnosis Date  . Anxiety   . 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 Cancer ([place])   . Colon polyp   . Contact lens/glasses fitting    HAS LENS IMPLANTS  . Lung cancer ([place]) 2015   LYMPHNODE REMOVAL   . 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 Osteoporosis     Past Surgical History:  Procedure Laterality Date  . ABDOMINAL SURGERY  1989   RUPTURED APPENDIX  . BREAST SURGERY  1999   LEFT BREAST LUMPECTOMY. DUCTAL CARCINOMA IN SITU./ RADIATION  . CATARACT EXTRACTION W/ INTRAOCULAR LENS IMPLANT  2009   BOTH EYES  . FOOT SURGERY  1070   RIGHT FOOT - PINCHED NERVE  . LUNG SURGERY     CALCIFICATIONS, POSITIVE - CANCER LYMPHNODE   . MELANOMA EXCISION  2008   CHEST AREA  . NECK SURGERY  Nov 09 2011   LYMPHNODES REMOVED 2 POSITIVE   . RIGHT COLECTOMY  06/02/2009  . THYROIDECTOMY  11/06/2009  . TONSILLECTOMY AND ADENOIDECTOMY      Family History  Problem Relation Age of Onset  . Cancer Father        throat/ bone  . Breast cancer Sister   . Cancer Sister        MEDULAR CANCER  . Cancer Brother        prostate  . Cancer Other        MEDULAR CANCER  . Breast cancer Maternal Aunt   . Cancer Paternal Aunt        pancreatic   . Cancer Cousin        ON MOTHER'S SIDE- LIVER CANCER    Social History:  reports that she quit smoking about 57 years ago. she has never used smokeless  tobacco. She reports that she does not drink alcohol or use drugs.  Allergies:  Allergies  Allergen Reactions  . Codeine     Medications:  I have reviewed the patient's current medications. Scheduled: . [MAR Hold] ALPRAZolam  0.5 mg Oral BID  . [MAR Hold] levothyroxine  112 mcg Oral QAC breakfast  . [MAR Hold] loperamide  4 mg Oral TID  . [MAR Hold] simethicone  80 mg Oral TID  . [MAR Hold] sodium chloride flush  3 mL Intravenous Q12H    Results for orders placed or performed during the hospital encounter of 10/23/17 (from the past 24 hour(s))  Basic metabolic panel     Status: Abnormal   Collection Time: 10/23/17  3:18 PM  Result Value Ref Range   Sodium 142 135 - 145 mmol/L   Potassium 3.6 3.5 - 5.1 mmol/L   Chloride 110 101 - 111 mmol/L   CO2 23 22 - 32 mmol/L   Glucose, Bld 117 (H) 65 - 99 mg/dL   BUN 12 6 - 20 mg/dL   Creatinine, Ser  0.85 0.44 - 1.00 mg/dL   Calcium 8.6 (L) 8.9 - 10.3 mg/dL   GFR calc non Af Amer >60 >60 mL/min   GFR calc Af Amer >60 >60 mL/min   Anion gap 9 5 - 15  CBC WITH DIFFERENTIAL     Status: Abnormal   Collection Time: 10/23/17  3:18 PM  Result Value Ref Range   WBC 12.3 (H) 4.0 - 10.5 K/uL   RBC 4.37 3.87 - 5.11 MIL/uL   Hemoglobin 12.9 12.0 - 15.0 g/dL   HCT 38.0 36.0 - 46.0 %   MCV 87.0 78.0 - 100.0 fL   MCH 29.5 26.0 - 34.0 pg   MCHC 33.9 30.0 - 36.0 g/dL   RDW 13.4 11.5 - 15.5 %   Platelets 228 150 - 400 K/uL   Neutrophils Relative % 89 %   Neutro Abs 10.9 (H) 1.7 - 7.7 K/uL   Lymphocytes Relative 6 %   Lymphs Abs 0.7 0.7 - 4.0 K/uL   Monocytes Relative 5 %   Monocytes Absolute 0.6 0.1 - 1.0 K/uL   Eosinophils Relative 0 %   Eosinophils Absolute 0.0 0.0 - 0.7 K/uL   Basophils Relative 0 %   Basophils Absolute 0.0 0.0 - 0.1 K/uL  Protime-INR     Status: None   Collection Time: 10/23/17  3:18 PM  Result Value Ref Range   Prothrombin Time 13.3 11.4 - 15.2 seconds   INR 1.02   Type and screen [place]      Status: None   Collection Time: 10/23/17  3:18 PM  Result Value Ref Range   ABO/RH(D) B POS    Antibody Screen NEG    Sample Expiration 10/26/2017   ABO/Rh     Status: None   Collection Time: 10/23/17  3:18 PM  Result Value Ref Range   ABO/RH(D) B POS     X-ray: CLINICAL DATA:  Left hip pain after a fall today.  EXAM: DG HIP (WITH OR WITHOUT PELVIS) 2-3V LEFT  COMPARISON:  None.  FINDINGS: There is a comminuted slightly impacted intertrochanteric fracture of the proximal left femur. The fracture extends into the proximal left femoral shaft.  Osteopenia.  IMPRESSION: Comminuted slightly impacted intertrochanteric fracture of the proximal left femur extending into the proximal shaft.   Electronically Signed   By: Lorriane Shire M.D.  ROS: Per medical history All other systems reviewed and apart from HPI, are negative    Blood pressure (!) 142/79, pulse (!) 110, temperature 99 F (37.2 C), temperature source Oral, resp. rate 18, height 5\' 3"  (1.6 m), weight 46.3 kg (102 lb), SpO2 98 %.  Physical Exam  General medical exam reviewed for pertinent findings  Awake alert very pleasant disposition  Left leg slightly shortened, externally rotated Pain with movement No RLE deformity No Bilateral UE findings  Assessment/Plan: Left intertrochanteric femur fracture To OR tonight for ORIF Ancef Consent ordered after reviewing risks, benefits, necessities  Post op course to follow based on procedural findings  Mauri Pole 10/23/2017, 9:31 PM

## 2017-10-23 NOTE — Anesthesia Procedure Notes (Signed)

## 2017-10-23 NOTE — ED Notes (Signed)
Internal Medicine at bedside.  

## 2017-10-23 NOTE — Progress Notes (Signed)
Attempt to call report.

## 2017-10-23 NOTE — Brief Op Note (Signed)
10/23/2017  10:48 PM  PATIENT:  Shannon [name]  76 y.o. female  PRE-OPERATIVE DIAGNOSIS:  Closed left intertrochanteric femur fracture  POST-OPERATIVE DIAGNOSIS:  Closed left intertrochanteric femur fracture  PROCEDURE:  Procedure(s): INTRAMEDULLARY (IM) NAIL FEMORAL (Left), ORIF  SURGEON:  Surgeon(s) and Role:    Paralee Cancel, MD - Primary  PHYSICIAN ASSISTANT: None  ANESTHESIA:   general  EBL:  <50cc   BLOOD ADMINISTERED:none  DRAINS: none   LOCAL MEDICATIONS USED:  NONE  SPECIMEN:  No Specimen  DISPOSITION OF SPECIMEN:  N/A  COUNTS:  YES  TOURNIQUET:  * No tourniquets in log *  DICTATION: .Other Dictation: Dictation Number [id number]  PLAN OF CARE: Admit to inpatient   PATIENT DISPOSITION:  PACU - hemodynamically stable.   Delay start of Pharmacological VTE agent (>24hrs) due to surgical blood loss or risk of bleeding: no

## 2017-10-23 NOTE — ED Notes (Addendum)
Patient transported to X-ray. (Husband went with as well).

## 2017-10-23 NOTE — Anesthesia Preprocedure Evaluation (Signed)
Anesthesia Evaluation  Patient identified by MRN, date of birth, ID band Patient awake    Reviewed: Allergy & Precautions, H&P , NPO status , Patient's Chart, lab work & pertinent test results  Airway Mallampati: II   Neck ROM: full    Dental   Pulmonary former smoker,  H/o lung CA   breath sounds clear to auscultation       Cardiovascular negative cardio ROS   Rhythm:regular Rate:Normal     Neuro/Psych PSYCHIATRIC DISORDERS Anxiety    GI/Hepatic   Endo/Other  Hypothyroidism   Renal/GU      Musculoskeletal   Abdominal   Peds  Hematology   Anesthesia Other Findings   Reproductive/Obstetrics H/o breast CA                             Anesthesia Physical Anesthesia Plan  ASA: III  Anesthesia Plan:    Post-op Pain Management:    Induction: Intravenous  PONV Risk Score and Plan: 2 and Ondansetron, Dexamethasone and Treatment may vary due to age or medical condition  Airway Management Planned: Oral ETT  Additional Equipment:   Intra-op Plan:   Post-operative Plan: Extubation in OR  Informed Consent: I have reviewed the patients History and Physical, chart, labs and discussed the procedure including the risks, benefits and alternatives for the proposed anesthesia with the patient or authorized representative who has indicated his/her understanding and acceptance.     Plan Discussed with: CRNA, Anesthesiologist and Surgeon  Anesthesia Plan Comments:         Anesthesia Quick Evaluation

## 2017-10-23 NOTE — ED Triage Notes (Signed)
Per EMS pt from home tripped on curb with no LOC, head neck or back pain, no head injury. No crepitus, no obvious deformities noted. Pt placed on pelvic binder to assist in lifting patient on to stretcher. Patient endorses left thigh pain, was not ambulatory on scene. Patietn tender to palpation, +PMS- rates pain 4/10. Pt alert and oriented x4.

## 2017-10-24 DIAGNOSIS — C73 Malignant neoplasm of thyroid gland: Secondary | ICD-10-CM

## 2017-10-24 DIAGNOSIS — Z853 Personal history of malignant neoplasm of breast: Secondary | ICD-10-CM

## 2017-10-24 DIAGNOSIS — E070 Hypersecretion of calcitonin: Secondary | ICD-10-CM

## 2017-10-24 LAB — CBC
HCT: 36 % (ref 36.0–46.0)
HEMOGLOBIN: 12.3 g/dL (ref 12.0–15.0)
MCH: 29.5 pg (ref 26.0–34.0)
MCHC: 34.2 g/dL (ref 30.0–36.0)
MCV: 86.3 fL (ref 78.0–100.0)
PLATELETS: 199 10*3/uL (ref 150–400)
RBC: 4.17 MIL/uL (ref 3.87–5.11)
RDW: 13.5 % (ref 11.5–15.5)
WBC: 9 10*3/uL (ref 4.0–10.5)

## 2017-10-24 LAB — BASIC METABOLIC PANEL
Anion gap: 10 (ref 5–15)
BUN: 7 mg/dL (ref 6–20)
CALCIUM: 8.1 mg/dL — AB (ref 8.9–10.3)
CO2: 24 mmol/L (ref 22–32)
CREATININE: 0.65 mg/dL (ref 0.44–1.00)
Chloride: 104 mmol/L (ref 101–111)
GFR calc non Af Amer: >60 mL/min (ref >60)
Glucose, Bld: 189 mg/dL — ABNORMAL HIGH (ref 65–99)
Potassium: 4 mmol/L (ref 3.5–5.1)
SODIUM: 138 mmol/L (ref 135–145)

## 2017-10-24 LAB — MAGNESIUM: MAGNESIUM: 1.7 mg/dL (ref 1.7–2.4)

## 2017-10-24 LAB — SURGICAL PCR SCREEN
MRSA, PCR: NEGATIVE
Staphylococcus aureus: NEGATIVE

## 2017-10-24 MED ORDER — DOCUSATE SODIUM 100 MG PO CAPS
100.0000 mg | ORAL_CAPSULE | Freq: Two times a day (BID) | ORAL | Status: DC
  Administered 2017-10-24 – 2017-10-27 (×3): 100 mg via ORAL
  Filled 2017-10-24 (×6): qty 1

## 2017-10-24 MED ORDER — ACETAMINOPHEN 325 MG PO TABS
650.0000 mg | ORAL_TABLET | Freq: Four times a day (QID) | ORAL | Status: DC | PRN
  Administered 2017-10-24: 650 mg via ORAL
  Filled 2017-10-24 (×2): qty 2

## 2017-10-24 MED ORDER — ZOLEDRONIC ACID 5 MG/100ML IV SOLN
5.0000 mg | Freq: Once | INTRAVENOUS | Status: DC
Start: 2017-10-24 — End: 2017-10-24

## 2017-10-24 MED ORDER — OMEGA-3-ACID ETHYL ESTERS 1 G PO CAPS
1000.0000 mg | ORAL_CAPSULE | Freq: Every day | ORAL | Status: DC
  Administered 2017-10-24 – 2017-10-27 (×4): 1000 mg via ORAL
  Filled 2017-10-24 (×4): qty 1

## 2017-10-24 MED ORDER — LORATADINE 10 MG PO TABS
10.0000 mg | ORAL_TABLET | Freq: Every day | ORAL | Status: DC
  Administered 2017-10-24 – 2017-10-27 (×4): 10 mg via ORAL
  Filled 2017-10-24 (×4): qty 1

## 2017-10-24 MED ORDER — HYDROMORPHONE HCL 1 MG/ML IJ SOLN
0.5000 mg | INTRAMUSCULAR | Status: DC | PRN
Start: 2017-10-24 — End: 2017-10-27

## 2017-10-24 MED ORDER — MENTHOL 3 MG MT LOZG: 1.0000 | LOZENGE | OROMUCOSAL | Status: DC | PRN

## 2017-10-24 MED ORDER — ALUM & MAG HYDROXIDE-SIMETH 200-200-20 MG/5ML PO SUSP: 30.0000 mL | ORAL | Status: DC | PRN

## 2017-10-24 MED ORDER — DIPHENHYDRAMINE HCL 25 MG PO CAPS
50.0000 mg | ORAL_CAPSULE | Freq: Every day | ORAL | Status: DC
  Administered 2017-10-24 – 2017-10-26 (×4): 50 mg via ORAL
  Filled 2017-10-24 (×4): qty 2

## 2017-10-24 MED ORDER — VITAMIN D 1000 UNITS PO TABS
1000.0000 [IU] | ORAL_TABLET | Freq: Every day | ORAL | Status: DC
  Administered 2017-10-24 – 2017-10-27 (×4): 1000 [IU] via ORAL
  Filled 2017-10-24 (×4): qty 1

## 2017-10-24 MED ORDER — CHLORHEXIDINE GLUCONATE 4 % EX LIQD: 60.0000 mL | Freq: Once | CUTANEOUS | Status: DC

## 2017-10-24 MED ORDER — METOCLOPRAMIDE HCL 5 MG/ML IJ SOLN: 5.0000 mg | Freq: Three times a day (TID) | INTRAMUSCULAR | Status: DC | PRN

## 2017-10-24 MED ORDER — METHOCARBAMOL 1000 MG/10ML IJ SOLN: 500.0000 mg | Freq: Four times a day (QID) | INTRAMUSCULAR | Status: DC | PRN

## 2017-10-24 MED ORDER — CEFAZOLIN SODIUM-DEXTROSE 1-4 GM/50ML-% IV SOLN
1.0000 g | Freq: Four times a day (QID) | INTRAVENOUS | Status: AC
  Administered 2017-10-24 (×2): 1 g via INTRAVENOUS
  Filled 2017-10-24 (×2): qty 50

## 2017-10-24 MED ORDER — ONDANSETRON HCL 4 MG PO TABS
4.0000 mg | ORAL_TABLET | Freq: Four times a day (QID) | ORAL | Status: DC | PRN
  Administered 2017-10-25 (×2): 4 mg via ORAL
  Filled 2017-10-24: qty 1

## 2017-10-24 MED ORDER — METOCLOPRAMIDE HCL 5 MG PO TABS: 5.0000 mg | ORAL_TABLET | Freq: Three times a day (TID) | ORAL | Status: DC | PRN

## 2017-10-24 MED ORDER — METHOCARBAMOL 500 MG PO TABS
500.0000 mg | ORAL_TABLET | Freq: Four times a day (QID) | ORAL | Status: DC | PRN
  Administered 2017-10-24 (×2): 500 mg via ORAL
  Filled 2017-10-24 (×2): qty 1

## 2017-10-24 MED ORDER — GLUCOSAMINE-CHONDROITIN 250-200 MG PO CAPS: 1.0000 | ORAL_CAPSULE | Freq: Every day | ORAL | Status: DC

## 2017-10-24 MED ORDER — POVIDONE-IODINE 10 % EX SWAB: 2.0000 "application " | Freq: Once | CUTANEOUS | Status: DC

## 2017-10-24 MED ORDER — PHENOL 1.4 % MT LIQD: 1.0000 | OROMUCOSAL | Status: DC | PRN

## 2017-10-24 MED ORDER — DIPHENHYDRAMINE-APAP (SLEEP) 25-500 MG PO TABS: 2.0000 | ORAL_TABLET | Freq: Every day | ORAL | Status: DC

## 2017-10-24 MED ORDER — ACETAMINOPHEN 650 MG RE SUPP: 650.0000 mg | Freq: Four times a day (QID) | RECTAL | Status: DC | PRN

## 2017-10-24 MED ORDER — ACETAMINOPHEN 500 MG PO TABS
1000.0000 mg | ORAL_TABLET | Freq: Every day | ORAL | Status: DC
  Administered 2017-10-24 – 2017-10-26 (×2): 1000 mg via ORAL
  Filled 2017-10-24 (×2): qty 2

## 2017-10-24 MED ORDER — CALCIUM CARBONATE-VITAMIN D 500-200 MG-UNIT PO TABS
1.0000 | ORAL_TABLET | Freq: Every day | ORAL | Status: DC
  Administered 2017-10-24 – 2017-10-27 (×4): 1 via ORAL
  Filled 2017-10-24 (×5): qty 1

## 2017-10-24 MED ORDER — ASPIRIN EC 325 MG PO TBEC
325.0000 mg | DELAYED_RELEASE_TABLET | Freq: Two times a day (BID) | ORAL | Status: DC
  Administered 2017-10-24 – 2017-10-27 (×8): 325 mg via ORAL
  Filled 2017-10-24 (×8): qty 1

## 2017-10-24 MED ORDER — POLYETHYLENE GLYCOL 3350 17 G PO PACK: 17.0000 g | PACK | Freq: Every day | ORAL | Status: DC | PRN

## 2017-10-24 MED ORDER — HYDROCODONE-ACETAMINOPHEN 5-325 MG PO TABS
1.0000 | ORAL_TABLET | Freq: Four times a day (QID) | ORAL | Status: DC | PRN
Start: 2017-10-24 — End: 2017-10-27
  Administered 2017-10-24 – 2017-10-25 (×3): 1 via ORAL
  Administered 2017-10-25 – 2017-10-27 (×7): 2 via ORAL
  Filled 2017-10-24 (×2): qty 2
  Filled 2017-10-24 (×2): qty 1
  Filled 2017-10-24 (×6): qty 2
  Filled 2017-10-24: qty 1

## 2017-10-24 MED ORDER — CEFAZOLIN SODIUM-DEXTROSE 2-4 GM/100ML-% IV SOLN: 2.0000 g | INTRAVENOUS | Status: DC

## 2017-10-24 MED ORDER — BIOTIN 1000 MCG PO TABS: 1000.0000 ug | ORAL_TABLET | Freq: Every day | ORAL | Status: DC

## 2017-10-24 MED ORDER — SODIUM CHLORIDE 0.9 % IV SOLN
INTRAVENOUS | Status: DC
  Administered 2017-10-24 (×2): via INTRAVENOUS

## 2017-10-24 MED ORDER — SODIUM CHLORIDE 0.9 % IV SOLN: INTRAVENOUS | Status: DC

## 2017-10-24 MED ORDER — FERROUS SULFATE 325 (65 FE) MG PO TABS
325.0000 mg | ORAL_TABLET | Freq: Two times a day (BID) | ORAL | Status: DC
  Administered 2017-10-24 – 2017-10-27 (×7): 325 mg via ORAL
  Filled 2017-10-24 (×8): qty 1

## 2017-10-24 MED ORDER — ADULT MULTIVITAMIN W/MINERALS CH
1.0000 | ORAL_TABLET | Freq: Every day | ORAL | Status: DC
  Administered 2017-10-24 – 2017-10-27 (×4): 1 via ORAL
  Filled 2017-10-24 (×4): qty 1

## 2017-10-24 MED ORDER — ONDANSETRON HCL 4 MG/2ML IJ SOLN: 4.0000 mg | Freq: Four times a day (QID) | INTRAMUSCULAR | Status: DC | PRN

## 2017-10-24 NOTE — Progress Notes (Signed)
PHARMACIST - PHYSICIAN ORDER COMMUNICATION  CONCERNING: P&T Medication Policy on Herbal Medications  DESCRIPTION:  This patient's order for:  Biotin and Glucosamine Chondroitin  has been noted.  This product(s) is classified as an "herbal" or natural product. Due to a lack of definitive safety studies or FDA approval, nonstandard manufacturing practices, plus the potential risk of unknown drug-drug interactions while on inpatient medications, the Pharmacy and Therapeutics Committee does not permit the use of "herbal" or natural products of this type within [facility].   ACTION TAKEN: The pharmacy department is unable to verify this order at this time and your patient has been informed of this safety policy. Please reevaluate patient's clinical condition at discharge and address if the herbal or natural product(s) should be resumed at that time.

## 2017-10-24 NOTE — Evaluation (Signed)
Occupational Therapy Evaluation Patient Details Name: [name] [name] MRN: [id number] DOB: 12-13-1940 Today's Date: 10/24/2017    History of Present Illness Pt is a 76 y.o. female admitted 10/23/17 post-fall now s/p L femur ORIF (50% PWB). Pertinent PMH includes osteoporosis, cancer, anxiety.   Clinical Impression   Pt was admitted for the above. She was independent with adls prior to admission and will benefit from continued OT in acute setting to further educate on safe bathroom transfers following PWB.  Pt's family can assist with adls as needed. Educated on use of reacher, if she wants to get this    Follow Up Recommendations  Supervision/Assistance - 24 hour    Equipment Recommendations  3 in 1 bedside commode    Recommendations for Other Services       Precautions / Restrictions Precautions Precautions: Fall Restrictions Weight Bearing Restrictions: Yes LLE Weight Bearing: Partial weight bearing LLE Partial Weight Bearing Percentage or Pounds: 50%      Mobility Bed Mobility Overal bed mobility: Needs Assistance Bed Mobility: Rolling;Sidelying to Sit Rolling: Min guard Sidelying to sit: Min assist       General bed mobility comments: oob  Transfers Overall transfer level: Needs assistance Equipment used: Rolling walker (2 wheeled) Transfers: Sit to/from Stand Sit to Stand: Min assist         General transfer comment: light steadying assistance during slow transition from sit to stand from recliner.  Cues for UE/LE placement    Balance Overall balance assessment: Needs assistance   Sitting balance-Leahy Scale: Good     Standing balance support: Single extremity supported Standing balance-Leahy Scale: Poor Standing balance comment: Able to stand with single UE support and indep with pericare                           ADL either performed or assessed with clinical judgement   ADL Overall ADL's : Needs assistance/impaired Eating/Feeding:  Independent   Grooming: Set up;Sitting   Upper Body Bathing: Set up;Sitting   Lower Body Bathing: Moderate assistance;Sit to/from stand   Upper Body Dressing : Set up;Sitting   Lower Body Dressing: Maximal assistance;Sit to/from stand   Toilet Transfer: Minimal assistance;Stand-pivot;RW(chair)             General ADL Comments: pt able to reach to ankles but painful when coming up.  Educated on use of reacher (which she doesn't have) or help from family. She does have a long sponge at home.  Pt has urgency with urine and diarrhea (long standing). She plans to get depends type of garments now that she is moving more slowly.  Educated on use of 3:1 in shower stall, by the bed, and over the toilet if she has enough time to get there     Vision         Perception     Praxis      Pertinent Vitals/Pain Pain Assessment: Faces Faces Pain Scale: Hurts little more Pain Location: LLE Pain Intervention(s): Limited activity within patient's tolerance;Monitored during session;Premedicated before session;Repositioned;Ice applied     Hand Dominance     Extremity/Trunk Assessment Upper Extremity Assessment Upper Extremity Assessment: Overall WFL for tasks assessed          Communication Communication Communication: No difficulties   Cognition Arousal/Alertness: Awake/alert Behavior During Therapy: WFL for tasks assessed/performed Overall Cognitive Status: Within Functional Limits for tasks assessed  General Comments  Sister present during session    Exercises General Exercises - Lower Extremity Ankle Circles/Pumps: AROM;Both;10 reps;Seated Quad Sets: AROM;Both;10 reps;Seated Long Arc Quad: AAROM;Left;10 reps;Seated   Shoulder Instructions      Home Living Family/patient expects to be discharged to:: Private residence Living Arrangements: Spouse/significant other Available Help at Discharge: Family;Available 24  hours/day Type of Home: House Home Access: Stairs to enter [employer] of Steps: 8(8 steps with rail; or 4 steps no rail) Entrance Stairs-Rails: Right Home Layout: One level     Bathroom Shower/Tub: Walk-in shower(approximately 6" ledge)   Bathroom Toilet: Standard     Home Equipment: Environmental consultant - 2 wheels   Additional Comments: Lives with husband. Multiple family members in medical field live very close by      Prior Functioning/Environment Level of Independence: Independent                 OT Problem List: Decreased activity tolerance;Impaired balance (sitting and/or standing);Decreased knowledge of use of DME or AE;Decreased knowledge of precautions;Pain;Decreased strength      OT Treatment/Interventions: Self-care/ADL training;DME and/or AE instruction;Therapeutic activities;Patient/family education;Balance training    OT Goals(Current goals can be found in the care plan section) Acute Rehab OT Goals Patient Stated Goal: Return home OT Goal Formulation: With patient Time For Goal Achievement: 11/07/17 Potential to Achieve Goals: Good ADL Goals Pt Will Perform Grooming: with supervision;standing Pt Will Perform Toileting - Clothing Manipulation and hygiene: with supervision;sit to/from stand Pt Will Perform Tub/Shower Transfer: Shower transfer;with min guard assist;3 in 1;ambulating  OT Frequency: Min 2X/week   Barriers to D/C:            Co-evaluation              AM-PAC PT "6 Clicks" Daily Activity     Outcome Measure Help from another person eating meals?: None Help from another person taking care of personal grooming?: A Little Help from another person toileting, which includes using toliet, bedpan, or urinal?: A Little Help from another person bathing (including washing, rinsing, drying)?: A Lot Help from another person to put on and taking off regular upper body clothing?: A Little Help from another person to put on and taking off regular  lower body clothing?: A Lot 6 Click Score: 17   End of Session    Activity Tolerance: Patient tolerated treatment well Patient left: in chair;with call bell/phone within reach  OT Visit Diagnosis: Pain Pain - Right/Left: Left Pain - part of body: Hip                Time: 8022-3361 OT Time Calculation (min): 19 min Charges:  OT General Charges $OT Visit: 1 Visit OT Evaluation $OT Eval Low Complexity: 1 Low G-Codes:     [place], OTR/L [phone] 10/24/2017  Conroy Goracke 10/24/2017, 10:45 AM

## 2017-10-24 NOTE — Progress Notes (Signed)
Patient ID: Shannon [name], female   DOB: 05-11-1941, 76 y.o.   MRN: [id number] Subjective: 1 Day Post-Op Procedure(s) (LRB): INTRAMEDULLARY (IM) NAIL FEMORAL (Left)    Patient reports pain as mild to moderate depending on activity.  Up in bed eating breakfast this am  Objective:   VITALS:   Vitals:   10/24/17 0010 10/24/17 0548  BP:  121/67  Pulse:  94  Resp:    Temp: 97.9 F (36.6 C) 98 F (36.7 C)  SpO2: 98% 99%    Neurovascular intact Incision: dressing C/D/I  LABS Recent Labs    10/23/17 1518  HGB 12.9  HCT 38.0  WBC 12.3*  PLT 228    Recent Labs    10/23/17 1518  NA 142  K 3.6  BUN 12  CREATININE 0.85  GLUCOSE 117*    Recent Labs    10/23/17 1518  INR 1.02     Assessment/Plan: 1 Day Post-Op Procedure(s) (LRB): INTRAMEDULLARY (IM) NAIL FEMORAL (Left)   Advance diet Up with therapy - PWB LLE Discharge pending mobility assessment and home needs ASA for DVT prophylaxis

## 2017-10-24 NOTE — Evaluation (Addendum)
Physical Therapy Evaluation Patient Details Name: Shannon [name] MRN: [id number] DOB: 1941/07/31 Today's Date: 10/24/2017   History of Present Illness  Pt is a 76 y.o. female admitted 10/23/17 post-fall now s/p L femur ORIF (50% PWB). Pertinent PMH includes osteoporosis, cancer, anxiety.    Clinical Impression  Pt presents with pain and an overall decrease in functional mobility secondary to above. PTA, pt indep and lives with husband; has multiple family members nearby available for 24/7 support. Educ on precautions, positioning, therex, and importance of mobility. Today, pt able to transfer and ambulate with RW and min guard for balance, distance limited secondary to increased pain; required minA for bed mobility. Pt would benefit from continued acute PT services to maximize functional mobility and independence prior to d/c home with HHPT services.     Follow Up Recommendations Home health PT;Supervision for mobility/OOB    Equipment Recommendations  3in1 (PT)    Recommendations for Other Services OT consult     Precautions / Restrictions Precautions Precautions: Fall Restrictions Weight Bearing Restrictions: Yes LLE Weight Bearing: Partial weight bearing LLE Partial Weight Bearing Percentage or Pounds: 50%      Mobility  Bed Mobility Overal bed mobility: Needs Assistance Bed Mobility: Rolling;Sidelying to Sit Rolling: Min guard Sidelying to sit: Min assist       General bed mobility comments: MinA to assist LLE to EOB; increased time and effort secondary to pain  Transfers Overall transfer level: Needs assistance Equipment used: Rolling walker (2 wheeled) Transfers: Sit to/from Stand Sit to Stand: Min guard;From elevated surface         General transfer comment: Cues for hand placement on RW. Pt incontinent of urine upon standing, requiring multiple sit-to-stands from bed with RW  Ambulation/Gait Ambulation/Gait assistance: Min guard Ambulation Distance (Feet):  15 Feet Assistive device: Rolling walker (2 wheeled) Gait Pattern/deviations: Step-to pattern;Decreased weight shift to left;Antalgic Gait velocity: Decreased Gait velocity interpretation: <1.8 ft/sec, indicative of risk for recurrent falls General Gait Details: Slow, antalgic amb with RW and min guard for safety. Pt putting <50% WB through LLE secondary to pain and utilizing almost a hop-to pattern. Encouraged to increased WB as able   Stairs            Wheelchair Mobility    Modified Rankin (Stroke Patients Only)       Balance Overall balance assessment: Needs assistance   Sitting balance-Leahy Scale: Good     Standing balance support: Single extremity supported Standing balance-Leahy Scale: Poor Standing balance comment: Able to stand with single UE support and indep with pericare                             Pertinent Vitals/Pain Pain Assessment: Faces Faces Pain Scale: Hurts little more Pain Location: LLE Pain Intervention(s): Monitored during session    Home Living Family/patient expects to be discharged to:: Private residence Living Arrangements: Spouse/significant other Available Help at Discharge: Family;Available 24 hours/day Type of Home: House Home Access: Stairs to enter Entrance Stairs-Rails: Right Entrance Stairs-Number of Steps: 8(8 steps with rail; or 4 steps no rail) Home Layout: One level Home Equipment: Environmental consultant - 2 wheels Additional Comments: Lives with husband. Multiple family members in medical field live very close by    Prior Function Level of Independence: Independent               Hand Dominance        Extremity/Trunk Assessment   Upper  Extremity Assessment Upper Extremity Assessment: Overall WFL for tasks assessed    Lower Extremity Assessment Lower Extremity Assessment: LLE deficits/detail LLE Deficits / Details: L hip flex grossly 2/5; knee flex/ext 3/5 LLE: Unable to fully assess due to pain        Communication   Communication: No difficulties  Cognition Arousal/Alertness: Awake/alert Behavior During Therapy: WFL for tasks assessed/performed Overall Cognitive Status: Within Functional Limits for tasks assessed                                        General Comments General comments (skin integrity, edema, etc.): Sister present during session    Exercises General Exercises - Lower Extremity Ankle Circles/Pumps: AROM;Both;10 reps;Seated Quad Sets: AROM;Both;10 reps;Seated Long Arc Quad: AAROM;Left;10 reps;Seated   Assessment/Plan    PT Assessment Patient needs continued PT services  PT Problem List Decreased strength;Decreased range of motion;Decreased activity tolerance;Decreased balance;Decreased mobility;Decreased knowledge of use of DME;Decreased knowledge of precautions;Pain       PT Treatment Interventions DME instruction;Gait training;Stair training;Functional mobility training;Therapeutic activities;Therapeutic exercise;Balance training;Patient/family education    PT Goals (Current goals can be found in the Care Plan section)  Acute Rehab PT Goals Patient Stated Goal: Return home PT Goal Formulation: With patient Time For Goal Achievement: 11/07/17 Potential to Achieve Goals: Good    Frequency Min 5X/week   Barriers to discharge        Co-evaluation               AM-PAC PT "6 Clicks" Daily Activity  Outcome Measure Difficulty turning over in bed (including adjusting bedclothes, sheets and blankets)?: A Little Difficulty moving from lying on back to sitting on the side of the bed? : Unable Difficulty sitting down on and standing up from a chair with arms (e.g., wheelchair, bedside commode, etc,.)?: A Little Help needed moving to and from a bed to chair (including a wheelchair)?: A Little Help needed walking in hospital room?: A Little Help needed climbing 3-5 steps with a railing? : A Little 6 Click Score: 16    End of Session  Equipment Utilized During Treatment: Gait belt Activity Tolerance: Patient tolerated treatment well Patient left: in chair;with call bell/phone within reach;with family/visitor present;with nursing/sitter in room Nurse Communication: Mobility status PT Visit Diagnosis: Other abnormalities of gait and mobility (R26.89);Pain Pain - Right/Left: Left Pain - part of body: Leg    Time: 0830-0910 PT Time Calculation (min) (ACUTE ONLY): 40 min   Charges:   PT Evaluation $PT Eval Low Complexity: 1 Low PT Treatments $Gait Training: 8-22 mins $Therapeutic Activity: 8-22 mins   PT G Codes:       Mabeline Caras, PT, DPT Acute Rehab Services  Pager: [place] 10/24/2017, 9:27 AM

## 2017-10-24 NOTE — Progress Notes (Addendum)
PROGRESS NOTE    Shannon [name]   SNK:[id number]  DOB: Mar 07, 1941  DOA: 10/23/2017 PCP: Aletha Halim., PA-C   Brief Narrative:   Shannon [name] is a 76 y.o. female with medical history of breast CA, medullary cancer of the thryoid and anxiety who tripped and fell and then noted pain in her left hip which is worse with movement.  Subjective: Pain controlled with Vicodin for now. No new complaints.  ROS: no complaints of nausea, vomiting, constipation diarrhea, cough, dyspnea or dysuria. No other complaints.   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 10/24/17 0023 10/24/17 0024   10/24/17 0400  ceFAZolin (ANCEF) IVPB 1 g/50 mL premix     1 g 100 mL/hr over 30 Minutes Intravenous Every 6 hours 10/24/17 0003 10/24/17 1010       Objective: Vitals:   10/24/17 0000 10/24/17 0010 10/24/17 0548 10/24/17 1300  BP:   121/67 (!) 120/50  Pulse: 99  94 97  Resp: (!) 21   18  Temp:  97.9 F (36.6 C) 98  F (36.7 C) 98 F (36.7 C)  TempSrc:   Oral Oral  SpO2: 100% 98% 99% 94%  Weight:      Height:        Intake/Output Summary (Last 24 hours) at 10/24/2017 1534 Last data filed at 10/24/2017 0700 Gross per 24 hour  Intake 1690 ml  Output 470 ml  Net 1220 ml   Filed Weights   10/23/17 1326  Weight: 46.3 kg (102 lb)    Examination: General exam: Appears comfortable  HEENT: PERRLA, oral mucosa moist, no sclera icterus or thrush Respiratory system: Clear to auscultation. Respiratory effort normal. Cardiovascular system: S1 & S2 heard, RRR.  No murmurs  Gastrointestinal system: Abdomen soft, non-tender, nondistended. Normal bowel sound. No organomegaly Central nervous system: Alert and oriented. No focal neurological deficits. Extremities: No cyanosis, clubbing - moderate edema of left thigh Skin: No rashes or ulcers Psychiatry:  Mood & affect appropriate.     Data Reviewed: I have personally reviewed following labs and imaging studies  CBC: Recent Labs  Lab 10/23/17 1518 10/24/17 0730  WBC 12.3* 9.0  NEUTROABS 10.9*  --   HGB 12.9 12.3  HCT 38.0 36.0  MCV 87.0 86.3  PLT 228 937   Basic Metabolic Panel: Recent Labs  Lab 10/23/17 1518 10/24/17 0730  NA 142 138  K  3.6 4.0  CL 110 104  CO2 23 24  GLUCOSE 117* 189*  BUN 12 7  CREATININE 0.85 0.65  CALCIUM 8.6* 8.1*  MG  --  1.7   GFR: Estimated Creatinine Clearance: 43.7 mL/min (by C-G formula based on SCr of 0.65 mg/dL). Liver Function Tests: No results for input(s): AST, ALT, ALKPHOS, BILITOT, PROT, ALBUMIN in the last 168 hours. No results for input(s): LIPASE, AMYLASE in the last 168 hours. No results for input(s): AMMONIA in the last 168 hours. Coagulation Profile: Recent Labs  Lab 10/23/17 1518  INR 1.02   Cardiac Enzymes: No results for input(s): CKTOTAL, CKMB, CKMBINDEX, TROPONINI in the last 168 hours. BNP (last 3 results) No results for input(s): PROBNP in the last 8760 hours. HbA1C: No  results for input(s): HGBA1C in the last 72 hours. CBG: No results for input(s): GLUCAP in the last 168 hours. Lipid Profile: No results for input(s): CHOL, HDL, LDLCALC, TRIG, CHOLHDL, LDLDIRECT in the last 72 hours. Thyroid Function Tests: No results for input(s): TSH, T4TOTAL, FREET4, T3FREE, THYROIDAB in the last 72 hours. Anemia Panel: No results for input(s): VITAMINB12, FOLATE, FERRITIN, TIBC, IRON, RETICCTPCT in the last 72 hours. Urine analysis:    Component Value Date/Time   COLORURINE YELLOW 11/05/2009 [place] 11/05/2009 0924   LABSPEC 1.017 11/05/2009 0924   PHURINE 6.0 11/05/2009 0924   GLUCOSEU NEGATIVE 11/05/2009 0924   HGBUR NEGATIVE 11/05/2009 0924   BILIRUBINUR NEGATIVE 11/05/2009 0924   KETONESUR NEGATIVE 11/05/2009 0924   PROTEINUR NEGATIVE 11/05/2009 0924   UROBILINOGEN 0.2 11/05/2009 0924   NITRITE NEGATIVE 11/05/2009 0924   LEUKOCYTESUR TRACE (A) 11/05/2009 0924   Sepsis Labs: @LABRCNTIP (procalcitonin:4,lacticidven:4) ) Recent Results (from the past 240 hour(s))  Surgical pcr screen     Status: None   Collection Time: 10/23/17  7:32 PM  Result Value Ref Range Status   MRSA, PCR NEGATIVE NEGATIVE Final   Staphylococcus aureus NEGATIVE NEGATIVE Final    Comment: (NOTE) The Xpert SA Assay (FDA approved for NASAL specimens in patients 23 years of age and older), is one component of a comprehensive surveillance program. It is not intended to diagnose infection nor to guide or monitor treatment.          Radiology Studies: Dg C-arm 1-60 Min  Result Date: 10/24/2017 CLINICAL DATA:  Internal fixation of left hip fracture. EXAM: OPERATIVE LEFT HIP WITH PELVIS COMPARISON:  Left hip radiographs performed earlier today at 2:06 p.m. FINDINGS: Three fluoroscopic C-arm images are provided from the OR, demonstrating placement of a dynamic left hip screw, transfixing the left femoral intertrochanteric fracture in grossly anatomic alignment. No  new fractures are seen. IMPRESSION: Status post internal fixation of left femoral intertrochanteric fracture in grossly anatomic alignment. Electronically Signed   By: Garald Balding M.D.   On: 10/24/2017 00:59   Dg Hip Operative Unilat W Or W/o Pelvis Left  Result Date: 10/24/2017 CLINICAL DATA:  Internal fixation of left hip fracture. EXAM: OPERATIVE LEFT HIP WITH PELVIS COMPARISON:  Left hip radiographs performed earlier today at 2:06 p.m. FINDINGS: Three fluoroscopic C-arm images are provided from the OR, demonstrating placement of a dynamic left hip screw, transfixing the left femoral intertrochanteric fracture in grossly anatomic alignment. No new fractures are seen. IMPRESSION: Status post internal fixation of left femoral intertrochanteric fracture in grossly anatomic alignment. Electronically Signed   By: Garald Balding M.D.   On: 10/24/2017 00:59   Dg Hip Unilat With Pelvis 2-3 Views Left  Result  Date: 10/23/2017 CLINICAL DATA:  Left hip pain after a fall today. EXAM: DG HIP (WITH OR WITHOUT PELVIS) 2-3V LEFT COMPARISON:  None. FINDINGS: There is a comminuted slightly impacted intertrochanteric fracture of the proximal left femur. The fracture extends into the proximal left femoral shaft. Osteopenia. IMPRESSION: Comminuted slightly impacted intertrochanteric fracture of the proximal left femur extending into the proximal shaft. Electronically Signed   By: Lorriane Shire M.D.   On: 10/23/2017 14:27      Scheduled Meds: . diphenhydrAMINE  50 mg Oral QHS   And  . acetaminophen  1,000 mg Oral QHS  . ALPRAZolam  0.5 mg Oral BID  . aspirin EC  325 mg Oral BID  . calcium-vitamin D  1 tablet Oral Q breakfast  . cholecalciferol  1,000 Units Oral Daily  . docusate sodium  100 mg Oral BID  . ferrous sulfate  325 mg Oral BID WC  . levothyroxine  112 mcg Oral QAC breakfast  . loperamide  4 mg Oral TID  . loratadine  10 mg Oral Daily  . multivitamin with minerals  1 tablet Oral Daily  .  omega-3 acid ethyl esters  1,000 mg Oral Daily  . simethicone  80 mg Oral TID   Continuous Infusions: . sodium chloride 75 mL/hr at 10/24/17 1114  . methocarbamol (ROBAXIN)  IV       LOS: 1 day    Time spent in minutes: 35    Debbe Odea, MD [facility] Pager: [email or web address] Password [facility] 10/24/2017, 3:34 PM

## 2017-10-24 NOTE — Progress Notes (Signed)
Orthopedic Tech Progress Note Patient Details:  Shannon [name] Sep 21, 1941 [id number]  Ortho Devices Ortho Device/Splint Location: Trapeze bar   Post Interventions Instructions Provided: Care of device   [place] Pink 10/24/2017, 11:14 AM

## 2017-10-25 ENCOUNTER — Encounter (HOSPITAL_COMMUNITY): Payer: Self-pay | Admitting: Orthopedic Surgery

## 2017-10-25 LAB — BASIC METABOLIC PANEL
ANION GAP: 8 (ref 5–15)
BUN: 7 mg/dL (ref 6–20)
CALCIUM: 8.2 mg/dL — AB (ref 8.9–10.3)
CHLORIDE: 105 mmol/L (ref 101–111)
CO2: 28 mmol/L (ref 22–32)
Creatinine, Ser: 0.67 mg/dL (ref 0.44–1.00)
GFR calc non Af Amer: >60 mL/min (ref >60)
GLUCOSE: 111 mg/dL — AB (ref 65–99)
POTASSIUM: 3.7 mmol/L (ref 3.5–5.1)
Sodium: 141 mmol/L (ref 135–145)

## 2017-10-25 MED ORDER — ASPIRIN 325 MG PO TBEC: 325.0000 mg | DELAYED_RELEASE_TABLET | Freq: Two times a day (BID) | ORAL | 0 refills | Status: AC

## 2017-10-25 MED ORDER — METHOCARBAMOL 500 MG PO TABS: 500.0000 mg | ORAL_TABLET | Freq: Four times a day (QID) | ORAL | 0 refills | Status: DC | PRN

## 2017-10-25 MED ORDER — HYDROCODONE-ACETAMINOPHEN 5-325 MG PO TABS: 1.0000 | ORAL_TABLET | Freq: Four times a day (QID) | ORAL | 0 refills | Status: DC | PRN

## 2017-10-25 NOTE — NC FL2 (Signed)
[place] LEVEL OF CARE SCREENING TOOL     IDENTIFICATION  Patient Name: Shannon [name] Birthdate: 1941/10/21 Sex: female Admission Date (Current Location): 10/23/2017  [facility] and [place] Number:  Herbalist and Address:  [facility] [place], [place] [address] [place], [place] [address]      Provider Number: [id number]  Attending Physician Name and Address:  Debbe Odea, MD  Relative Name and Phone Number:  Minda Faas, spouse, [phone]    Current Level of Care: Hospital Recommended Level of Care: [place] Prior Approval Number:    Date Approved/Denied:   PASRR Number: [id number]  Discharge Plan: SNF    Current Diagnoses: Patient Active Problem List   Diagnosis Date Noted  . 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 Closed left hip fracture ([place]) 10/23/2017  . Hip fracture ([place]) 10/23/2017  . Hydrosalpinx 12/23/2015  . 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 Bloating symptom 08/17/2012  . Osteoporosis 08/11/2011  . History of breast cancer in female 08/11/2011  . Hypothyroidism 08/11/2011  . Vitamin D deficiency 08/11/2011  . Thyroid cancer ([place]) 08/11/2011  . History of colonic polyps 08/11/2011    Orientation RESPIRATION BLADDER Height & Weight     Self, Time, Situation, Place  Normal Continent Weight: 102 lb (46.3 kg) Height:  5\' 3"  (160 cm)  BEHAVIORAL SYMPTOMS/MOOD NEUROLOGICAL BOWEL NUTRITION STATUS      Continent Diet(See DC Summary)  AMBULATORY STATUS COMMUNICATION OF NEEDS Skin   Limited Assist Verbally Surgical wounds                       Personal Care Assistance Level of Assistance  Dressing, Bathing, Feeding Bathing Assistance: Limited assistance Feeding assistance: Limited assistance Dressing Assistance: Limited assistance     Functional Limitations Info  Sight, Hearing, Speech Sight Info: Adequate Hearing Info:  Adequate Speech Info: Adequate    SPECIAL CARE FACTORS FREQUENCY  PT (By licensed PT), OT (By licensed OT)     PT Frequency: 5x week OT Frequency: 5x week            Contractures Contractures Info: Not present    Additional Factors Info  Code Status, Allergies Code Status Info: Full Allergies Info: Codeine           Current Medications (10/25/2017):  This is the current hospital active medication list Current Facility-Administered Medications  Medication Dose Route Frequency Provider Last Rate Last Dose  . 0.9 %  sodium chloride infusion   Intravenous Continuous Paralee Cancel, MD 75 mL/hr at 10/24/17 1114    . acetaminophen (TYLENOL) tablet 650 mg  650 mg Oral Q6H PRN Debbe Odea, MD   650 mg at 10/24/17 8469   Or  . acetaminophen (TYLENOL) suppository 650 mg  650 mg Rectal Q6H PRN Debbe Odea, MD      . acetaminophen (TYLENOL) tablet 650 mg  650 mg Oral Q6H PRN Paralee Cancel, MD   650 mg at 10/24/17 6295   Or  . acetaminophen (TYLENOL) suppository 650 mg  650 mg Rectal Q6H PRN Paralee Cancel, MD      . diphenhydrAMINE (BENADRYL) capsule 50 mg  50 mg Oral QHS Debbe Odea, MD   50 mg at 10/24/17 2249   And  . acetaminophen (TYLENOL) tablet 1,000 mg  1,000 mg Oral QHS Debbe Odea, MD   1,000 mg at 10/24/17 0131  . ALPRAZolam (XANAX) tablet 0.5 mg  0.5  mg Oral BID Debbe Odea, MD   0.5 mg at 10/25/17 0941  . alum & mag hydroxide-simeth (MAALOX/MYLANTA) 200-200-20 MG/5ML suspension 30 mL  30 mL Oral Q4H PRN Paralee Cancel, MD      . aspirin EC tablet 325 mg  325 mg Oral BID Paralee Cancel, MD   325 mg at 10/25/17 5009  . calcium-vitamin D (OSCAL WITH D) 500-200 MG-UNIT per tablet 1 tablet  1 tablet Oral Q breakfast Paralee Cancel, MD   1 tablet at 10/25/17 0941  . cholecalciferol (VITAMIN D) tablet 1,000 Units  1,000 Units Oral Daily Paralee Cancel, MD   1,000 Units at 10/25/17 [phone]  . docusate sodium (COLACE) capsule 100 mg  100 mg Oral BID Paralee Cancel, MD   100 mg at  10/25/17 0945  . ferrous sulfate tablet 325 mg  325 mg Oral BID WC Paralee Cancel, MD   325 mg at 10/25/17 0945  . HYDROcodone-acetaminophen (NORCO/VICODIN) 5-325 MG per tablet 1-2 tablet  1-2 tablet Oral Q6H PRN Paralee Cancel, MD   1 tablet at 10/25/17 1338  . HYDROmorphone (DILAUDID) injection 0.5-1 mg  0.5-1 mg Intravenous Q3H PRN Paralee Cancel, MD      . levothyroxine (SYNTHROID, LEVOTHROID) tablet 112 mcg  112 mcg Oral QAC breakfast Debbe Odea, MD   112 mcg at 10/25/17 0733  . loperamide (IMODIUM) capsule 4 mg  4 mg Oral TID Debbe Odea, MD   4 mg at 10/25/17 0942  . loratadine (CLARITIN) tablet 10 mg  10 mg Oral Daily Paralee Cancel, MD   10 mg at 10/25/17 2993  . menthol-cetylpyridinium (CEPACOL) lozenge 3 mg  1 lozenge Oral PRN Paralee Cancel, MD       Or  . phenol (CHLORASEPTIC) mouth spray 1 spray  1 spray Mouth/Throat PRN Paralee Cancel, MD      . methocarbamol (ROBAXIN) tablet 500 mg  500 mg Oral Q6H PRN Paralee Cancel, MD   500 mg at 10/24/17 7169   Or  . methocarbamol (ROBAXIN) 500 mg in dextrose 5 % 50 mL IVPB  500 mg Intravenous Q6H PRN Paralee Cancel, MD      . metoCLOPramide (REGLAN) tablet 5-10 mg  5-10 mg Oral Q8H PRN Paralee Cancel, MD       Or  . metoCLOPramide (REGLAN) injection 5-10 mg  5-10 mg Intravenous Q8H PRN Paralee Cancel, MD      . multivitamin with minerals tablet 1 tablet  1 tablet Oral Daily Paralee Cancel, MD   1 tablet at 10/25/17 [phone]  . omega-3 acid ethyl esters (LOVAZA) capsule 1,000 mg  1,000 mg Oral Daily Paralee Cancel, MD   1,000 mg at 10/25/17 0941  . ondansetron (ZOFRAN) tablet 4 mg  4 mg Oral Q6H PRN Paralee Cancel, MD   4 mg at 10/25/17 1340   Or  . ondansetron (ZOFRAN) injection 4 mg  4 mg Intravenous Q6H PRN Paralee Cancel, MD      . polyethylene glycol (MIRALAX / GLYCOLAX) packet 17 g  17 g Oral Daily PRN Paralee Cancel, MD      . simethicone [facility]) chewable tablet 80 mg  80 mg Oral TID Debbe Odea, MD   80 mg at 10/25/17 3810     Discharge  Medications: Please see discharge summary for a list of discharge medications.  Relevant Imaging Results:  Relevant Lab Results:   Additional Information SS#: [id number]  Normajean Baxter, LCSW

## 2017-10-25 NOTE — Progress Notes (Signed)
Pt ambulated 60ft in the hall with 1 assist, complained of fatigue and was transported back to room via recliner. Pt complained of mild pain during ambulation. Pt now resting in the room.

## 2017-10-25 NOTE — Progress Notes (Signed)
Pt given 2 Vicodin at [place], waited an hour, then ambulated Pt in the hallway 80 feet, 1 person assist. Pt tolerated well. Pain controlled. Pt back in room, resting in bed. Will continue to monitor.

## 2017-10-25 NOTE — Progress Notes (Signed)
Occupational Therapy Treatment Patient Details Name: Shannon [name] MRN: [id number] DOB: [birth year] Today's Date: 10/25/2017    History of present illness Pt is a 76 y.o. female admitted 10/23/17 post-fall now s/p L femur ORIF (50% PWB). Pertinent PMH includes osteoporosis, cancer, anxiety.   OT comments  This 76 yo female admitted and underwent above presents to acute OT with making progress with shower stall transfer today to 3n1. On the whole however she is moving really slowly and fatigues easily with only short bouts of activity. Option of SNF was discussed with pt and husband as well as making CM and SW aware.   Follow Up Recommendations  SNF;Supervision/Assistance - 24 hour;Other (comment)(pt progressing slowly--SNF option discussed with pt and husband they will discuss further)    Equipment Recommendations  None recommended by OT       Precautions / Restrictions Precautions Precautions: Fall Restrictions Weight Bearing Restrictions: Yes LLE Weight Bearing: Partial weight bearing LLE Partial Weight Bearing Percentage or Pounds: 50       Mobility Bed Mobility Overal bed mobility: Needs Assistance Bed Mobility: Supine to Sit   Sidelying to sit: Min assist       General bed mobility comments: OOB to left A with LLE  Transfers Overall transfer level: Needs assistance Equipment used: Rolling walker (2 wheeled) Transfers: Sit to/from Stand Sit to Stand: Min assist         General transfer comment: VCs for safe hand placement    Balance Overall balance assessment: Needs assistance Sitting-balance support: No upper extremity supported;Feet supported Sitting balance-Leahy Scale: Good     Standing balance support: Bilateral upper extremity supported;During functional activity Standing balance-Leahy Scale: Poor                             ADL either performed or assessed with clinical judgement   ADL Overall ADL's : Needs assistance/impaired                         Toilet Transfer: Minimal assistance;Ambulation;RW;BSC Toilet Transfer Details (indicate cue type and reason): in bathroom     Tub/ Shower Transfer: Walk-in shower;Ambulation;Rolling walker;3 in 1 Tub/Shower Transfer Details (indicate cue type and reason): min A until had to step over threshold then Mod A         Vision Patient Visual Report: No change from baseline            Cognition Arousal/Alertness: Awake/alert Behavior During Therapy: WFL for tasks assessed/performed Overall Cognitive Status: Within Functional Limits for tasks assessed                                                     Pertinent Vitals/ Pain       Pain Assessment: 0-10 Pain Score: 4  Pain Location: LLE Pain Descriptors / Indicators: Sore Pain Intervention(s): Limited activity within patient's tolerance;Monitored during session;Premedicated before session         Frequency  Min 2X/week        Progress Toward Goals  OT Goals(current goals can now be found in the care plan section)  Progress towards OT goals: Progressing toward goals     Plan Discharge plan needs to be updated;Equipment recommendations need to be updated       AM-PAC  PT "6 Clicks" Daily Activity     Outcome Measure   Help from another person eating meals?: None Help from another person taking care of personal grooming?: A Little Help from another person toileting, which includes using toliet, bedpan, or urinal?: A Little Help from another person bathing (including washing, rinsing, drying)?: A Lot Help from another person to put on and taking off regular upper body clothing?: A Little Help from another person to put on and taking off regular lower body clothing?: A Lot 6 Click Score: 17    End of Session Equipment Utilized During Treatment: Gait belt;Rolling walker  OT Visit Diagnosis: Unsteadiness on feet (R26.81);Other abnormalities of gait and mobility  (R26.89);Muscle weakness (generalized) (M62.81);Pain Pain - Right/Left: Left Pain - part of body: Hip   Activity Tolerance Patient tolerated treatment well   Patient Left (with PT sitting EOB)   Nurse Communication          Time: 0034-9179 OT Time Calculation (min): 29 min  Charges: OT General Charges $OT Visit: 1 Visit OT Treatments $Self Care/Home Management : 23-37 mins  [place], OTR/L [phone] 10/25/2017

## 2017-10-25 NOTE — Progress Notes (Signed)
Pt ambulated 80 ft in hall with 1 assist, tolerated well, assisted back to room, denies pain, now resting on the recliner.

## 2017-10-25 NOTE — Progress Notes (Signed)
Physical Therapy Treatment Patient Details Name: Shannon [name] MRN: [id number] DOB: [birth year] Today's Date: 10/25/2017    History of Present Illness Pt is a 76 y.o. female admitted 10/23/17 post-fall now s/p L femur ORIF (50% PWB). Pertinent PMH includes osteoporosis, cancer, anxiety.    PT Comments    Patient received finishing session with OT, pleasant and willing to work with PT this morning. Continued working on gait training, continuing to note reluctance to bear weight down through L LE even with cues and reassurance from PT, resulting in mainly hop to pattern this morning despite skilled cues from therapist. Also attempted navigation of single step, first with PT to ensure safety and good form, then again with husband stabilizing walker; patient able to perform with generally Min assist but limited by fatigue and expresses concerns with stair navigation at home. Education provided on possible benefits of short term rehab prior to return home. Patient left sitting at EOB with family present, all needs met this morning.     Follow Up Recommendations  Supervision for mobility/OOB;SNF;Home health PT(consider SNF due to reduced functional mobility and safety concerns at home, pending MD approval )     Equipment Recommendations  3in1 (PT)    Recommendations for Other Services OT consult     Precautions / Restrictions Precautions Precautions: Fall Restrictions Weight Bearing Restrictions: Yes LLE Weight Bearing: Partial weight bearing LLE Partial Weight Bearing Percentage or Pounds: 50    Mobility  Bed Mobility Overal bed mobility: Needs Assistance Bed Mobility: Supine to Sit   Sidelying to sit: Min assist       General bed mobility comments: DNT, received up with OT   Transfers Overall transfer level: Needs assistance Equipment used: Rolling walker (2 wheeled) Transfers: Sit to/from Stand Sit to Stand: Min assist         General transfer comment: VCs for hand  placement and sequencing   Ambulation/Gait Ambulation/Gait assistance: Min guard Ambulation Distance (Feet): 25 Feet(58f, 13f [id number] Assistive device: Rolling walker (2 wheeled) Gait Pattern/deviations: Step-to pattern;Decreased weight shift to left;Antalgic     General Gait Details: slow hop to pattern with RW, min guard; VC provided to correct mechanics and promote WB up to 50% through L LE however patient has difficulty in correcting to giat pattern, continues to perform hop to pattern    Stairs Stairs: Yes   Stair Management: No rails;With walker Number of Stairs: 1 General stair comments: practiced first with PT to ensure correct mechanics and safety, then practiced with husband stabilizing walker to mimic situation at home   Wheelchair Mobility    Modified Rankin (Stroke Patients Only)       Balance Overall balance assessment: Needs assistance Sitting-balance support: No upper extremity supported;Feet supported Sitting balance-Leahy Scale: Good     Standing balance support: Bilateral upper extremity supported;During functional activity Standing balance-Leahy Scale: Poor                              Cognition Arousal/Alertness: Awake/alert Behavior During Therapy: WFL for tasks assessed/performed Overall Cognitive Status: Within Functional Limits for tasks assessed                                        Exercises      General Comments        Pertinent Vitals/Pain Pain Assessment: 0-10 Pain  Score: 4  Pain Location: LLE Pain Descriptors / Indicators: Sore Pain Intervention(s): Limited activity within patient's tolerance;Monitored during session;Premedicated before session    Home Living                      Prior Function            PT Goals (current goals can now be found in the care plan section) Acute Rehab PT Goals Patient Stated Goal: Return home PT Goal Formulation: With patient Time For Goal  Achievement: 11/07/17 Potential to Achieve Goals: Good Progress towards PT goals: Progressing toward goals    Frequency    Min 5X/week      PT Plan Discharge plan needs to be updated    Co-evaluation              AM-PAC PT "6 Clicks" Daily Activity  Outcome Measure  Difficulty turning over in bed (including adjusting bedclothes, sheets and blankets)?: Unable Difficulty moving from lying on back to sitting on the side of the bed? : Unable Difficulty sitting down on and standing up from a chair with arms (e.g., wheelchair, bedside commode, etc,.)?: Unable Help needed moving to and from a bed to chair (including a wheelchair)?: A Little Help needed walking in hospital room?: A Little Help needed climbing 3-5 steps with a railing? : A Little 6 Click Score: 12    End of Session Equipment Utilized During Treatment: Gait belt Activity Tolerance: Patient tolerated treatment well;Patient limited by fatigue Patient left: in bed;with call bell/phone within reach;with family/visitor present   PT Visit Diagnosis: Other abnormalities of gait and mobility (R26.89);Pain Pain - Right/Left: Left Pain - part of body: Leg     Time: 1829-9371 PT Time Calculation (min) (ACUTE ONLY): 23 min  Charges:  $Gait Training: 8-22 mins $Self Care/Home Management: 8-22                    G Codes:      Deniece Ree PT, DPT, CBIS  Supplemental Physical Therapist [place]

## 2017-10-25 NOTE — Plan of Care (Signed)
  Elimination: Will not experience complications related to bowel motility 10/25/2017 1204 - Progressing by Williams Che, RN   Pain Managment: General experience of comfort will improve 10/25/2017 1204 - Progressing by Williams Che, RN   Safety: Ability to remain free from injury will improve 10/25/2017 1204 - Progressing by Williams Che, RN

## 2017-10-25 NOTE — Progress Notes (Signed)
Attempted to ambulate Pt, visitors at bedside, Pt refused. Pt said that she will call when she is ready.

## 2017-10-25 NOTE — Progress Notes (Signed)
OT Cancellation Note  Patient Details Name: Shannon [name] MRN: [id number] DOB: Jan 11, 1941   Cancelled Treatment:    Reason Eval/Treat Not Completed: Other (comment). Pt just got a late breakfast will check back later today for treatment session.  Almon Register [phone] 10/25/2017, 9:53 AM

## 2017-10-25 NOTE — Progress Notes (Signed)
     Subjective: 2 Days Post-Op Procedure(s) (LRB): INTRAMEDULLARY (IM) NAIL FEMORAL (Left)   Patient reports pain as mild, pain controlled with medication.  She didn't want to take medications at first, but realizes now that she needs to to control the pain until she has some healing in the leg.  We have discussed PWB to allow the bone to heal.  Objective:   VITALS:   Vitals:   10/24/17 2053 10/25/17 0620  BP: 120/68 118/66  Pulse: (!) 105 (!) 102  Resp: 17 17  Temp: 98.7 F (37.1 C) 98.1 F (36.7 C)  SpO2: 95% 95%    Dorsiflexion/Plantar flexion intact Incision: dressing C/D/I No cellulitis present Compartment soft  LABS Recent Labs    10/23/17 1518 10/24/17 0730  HGB 12.9 12.3  HCT 38.0 36.0  WBC 12.3* 9.0  PLT 228 199    Recent Labs    10/23/17 1518 10/24/17 0730  NA 142 138  K 3.6 4.0  BUN 12 7  CREATININE 0.85 0.65  GLUCOSE 117* 189*     Assessment/Plan: 2 Days Post-Op Procedure(s) (LRB): INTRAMEDULLARY (IM) NAIL FEMORAL (Left)  Up with therapy Discharge disposition TBD   Ortho recommendations:  ASA 325 mg bid for 4 weeks for anticoagulation, unless other medically indicated.  Norco for pain management (Rx written).  MiraLax and Colace for constipation  Iron 325 mg tid for 2-3 weeks   PWB 50% on the left leg.  Dressing to remain in place for .  Dressing is waterproof and may shower with it in place.  Follow up in 2 [facility] at [facility]. Follow up with OLIN,Remijio Holleran D in 2 weeks.  Contact information:  [facility] [address], [address] [place] Aran Menning   PAC  10/25/2017, 7:46 AM

## 2017-10-26 ENCOUNTER — Encounter (HOSPITAL_COMMUNITY): Payer: Self-pay | Admitting: Orthopedic Surgery

## 2017-10-26 ENCOUNTER — Encounter: Payer: Medicare Other | Admitting: Obstetrics & Gynecology

## 2017-10-26 DIAGNOSIS — S72142A Displaced intertrochanteric fracture of left femur, initial encounter for closed fracture: Principal | ICD-10-CM

## 2017-10-26 MED ORDER — FERROUS SULFATE 325 (65 FE) MG PO TABS: 325.0000 mg | ORAL_TABLET | Freq: Two times a day (BID) | ORAL | 3 refills | Status: DC

## 2017-10-26 NOTE — Plan of Care (Signed)
  Progressing Education: Knowledge of General Education information will improve 10/26/2017 1057 - Progressing by Rance Muir, RN Health Behavior/Discharge Planning: Ability to manage health-related needs will improve 10/26/2017 1057 - Progressing by Rance Muir, RN Clinical Measurements: Ability to maintain clinical measurements within normal limits will improve 10/26/2017 1057 - Progressing by Rance Muir, RN Will remain free from infection 10/26/2017 1057 - Progressing by Rance Muir, RN Diagnostic test results will improve 10/26/2017 1057 - Progressing by Rance Muir, RN Respiratory complications will improve 10/26/2017 1057 - Progressing by Rance Muir, RN Cardiovascular complication will be avoided 10/26/2017 1057 - Progressing by Rance Muir, RN Activity: Risk for activity intolerance will decrease 10/26/2017 1057 - Progressing by Rance Muir, RN Nutrition: Adequate nutrition will be maintained 10/26/2017 1057 - Progressing by Rance Muir, RN Coping: Level of anxiety will decrease 10/26/2017 1057 - Progressing by Rance Muir, RN Elimination: Will not experience complications related to bowel motility 10/26/2017 1057 - Progressing by Rance Muir, RN Will not experience complications related to urinary retention 10/26/2017 1057 - Progressing by Rance Muir, RN Pain Managment: General experience of comfort will improve 10/26/2017 1057 - Progressing by Rance Muir, RN Safety: Ability to remain free from injury will improve 10/26/2017 1057 - Progressing by Rance Muir, RN Skin Integrity: Risk for impaired skin integrity will decrease 10/26/2017 1057 - Progressing by Rance Muir, RN Education: Verbalization of understanding the information provided (i.e., activity precautions, restrictions, etc) will improve 10/26/2017 1057 - Progressing by Rance Muir, RN Activity: Ability to ambulate and perform ADLs will improve 10/26/2017 1057 - Progressing by Rance Muir, RN Clinical Measurements: Postoperative  complications will be avoided or minimized 10/26/2017 1057 - Progressing by Rance Muir, RN Self-Concept: Ability to maintain and perform role responsibilities to the fullest extent possible will improve 10/26/2017 1057 - Progressing by Rance Muir, RN Pain Management: Pain level will decrease 10/26/2017 1057 - Progressing by Rance Muir, RN

## 2017-10-26 NOTE — Progress Notes (Signed)
Physical Therapy Treatment Patient Details Name: Shannon [name] MRN: [id number] DOB: 02-17-1941 Today's Date: 10/26/2017    History of Present Illness Pt is a 76 y.o. female admitted 10/23/17 post-fall now s/p L femur ORIF (50% PWB). Pertinent PMH includes osteoporosis, cancer, anxiety.    PT Comments    Patient received up in chair, very pleasant and willing to participate with PT and reports she has been getting up regularly with nursing to walk. Patient now able to gait train approximately 80f with PT today, however continues to require VC to correct gait mechanics as she continues to demonstrate general hop to pattern. Also continued to practice stairs, using bilateral railings today (due to patient reports of having B railings installed in stairs at home); patient able to complete with min guard and good technique this morning. Patient left up in chair with family present, all needs otherwise met.     Follow Up Recommendations  Supervision for mobility/OOB;SNF;Home health PT     Equipment Recommendations  3in1 (PT)    Recommendations for Other Services       Precautions / Restrictions Precautions Precautions: Fall Restrictions Weight Bearing Restrictions: Yes LLE Weight Bearing: Partial weight bearing LLE Partial Weight Bearing Percentage or Pounds: 50    Mobility  Bed Mobility               General bed mobility comments: DNT, received up in chair with nurse tech   Transfers Overall transfer level: Needs assistance Equipment used: Rolling walker (2 wheeled) Transfers: Sit to/from Stand Sit to Stand: Min guard         General transfer comment: much improved mechancis and technique today   Ambulation/Gait Ambulation/Gait assistance: Min guard Ambulation Distance (Feet): 80 Feet Assistive device: Rolling walker (2 wheeled) Gait Pattern/deviations: Step-to pattern;Decreased step length - right;Decreased stance time - left;Decreased dorsiflexion -  left;Decreased weight shift to left;Trunk flexed;Narrow base of support     General Gait Details: continues with general hop to pattern with RW, continues to require VC to improve gait pattern with 50% WB status and bear weight through entire foot of L LE instead of just toes; gait tolerance and speed much improved however    Stairs Stairs: Yes   Stair Management: Two rails Number of Stairs: 2 General stair comments: practiced stairs with B railings due to patient reports of having railings installed; patient able to perform stair navigation with min guard, VCs and maintained WB status well   Wheelchair Mobility    Modified Rankin (Stroke Patients Only)       Balance Overall balance assessment: Needs assistance Sitting-balance support: Feet supported;No upper extremity supported Sitting balance-Leahy Scale: Good     Standing balance support: Bilateral upper extremity supported;During functional activity Standing balance-Leahy Scale: Fair                              Cognition Arousal/Alertness: Awake/alert Behavior During Therapy: WFL for tasks assessed/performed Overall Cognitive Status: Within Functional Limits for tasks assessed                                        Exercises      General Comments        Pertinent Vitals/Pain Pain Assessment: No/denies pain Pain Score: 0-No pain Pain Intervention(s): Monitored during session    Home Living  Prior Function            PT Goals (current goals can now be found in the care plan section) Acute Rehab PT Goals Patient Stated Goal: Return home PT Goal Formulation: With patient Time For Goal Achievement: 11/07/17 Potential to Achieve Goals: Good Progress towards PT goals: Progressing toward goals    Frequency    Min 5X/week      PT Plan Current plan remains appropriate    Co-evaluation              AM-PAC PT "6 Clicks" Daily Activity   Outcome Measure  Difficulty turning over in bed (including adjusting bedclothes, sheets and blankets)?: Unable Difficulty moving from lying on back to sitting on the side of the bed? : Unable Difficulty sitting down on and standing up from a chair with arms (e.g., wheelchair, bedside commode, etc,.)?: Unable Help needed moving to and from a bed to chair (including a wheelchair)?: None Help needed walking in hospital room?: None Help needed climbing 3-5 steps with a railing? : A Little 6 Click Score: 14    End of Session Equipment Utilized During Treatment: Gait belt Activity Tolerance: Patient tolerated treatment well Patient left: in chair;with family/visitor present;with call bell/phone within reach   PT Visit Diagnosis: Other abnormalities of gait and mobility (R26.89);Pain Pain - Right/Left: Left Pain - part of body: Leg     Time: 8938-1017 PT Time Calculation (min) (ACUTE ONLY): 27 min  Charges:  $Gait Training: 23-37 mins                    G Codes:       Deniece Ree PT, DPT, CBIS  Supplemental Physical Therapist [place]

## 2017-10-26 NOTE — Clinical Social Work Note (Signed)
Clinical Social Work Assessment  Patient Details  Name: Shannon [name] MRN: [id number] Date of Birth: 1941-03-03  Date of referral:  10/26/17               Reason for consult:  Facility Placement                Permission sought to share information with:  Case Manager Permission granted to share information::  Yes, Verbal Permission Granted  Name::     Arnell Sieving  Agency::  [facility]  Relationship::     Contact Information:     Housing/Transportation Living arrangements for the past 2 months:  [place] of Information:  Patient Patient Interpreter Needed:  None Criminal Activity/Legal Involvement Pertinent to Current Situation/Hospitalization:  No - Comment as needed Significant Relationships:  Adult Children, Spouse, Other Family Members Lives with:  Spouse Do you feel safe going back to the place where you live?  No Need for family participation in patient care:  No (Coment)  Care giving concerns:  Pt resides at home with spouse. Pt was independent with ambulation and ADL's prior to hospitalization for impairment. Pt spouse unable to care for her given new impairment. Pt amenable to SNF placement. CSW answered questions about SNF placement as patient has no experience. CSW obtained permission to submit to SNF. Pt prefers [facility]. CSW explained the insurance auth process and explained to patient that we will need insurance auth prior to discharge to SNF. Patient in agreement.   Social Worker assessment / plan:  CSW will f/u for disposition to SNF. CSW will start insurance auth for placement at [facility]. CSW confirmed bed offer with admission staff.   Employment status:  Retired Nurse, adult PT Recommendations:  [place] / Referral to community resources:  [place]  Patient/Family's Response to care:  Patient appreciative of CSW coming to meet with her and discuss the SNF  process. No other issues or concerns identified.  Patient/Family's Understanding of and Emotional Response to Diagnosis, Current Treatment, and Prognosis:  Patient has good understanding of diagnosis, current treatment and prognosis as she acknowledged with the new impairment she will need a higher level of care than going home. Pt identified that spouse cannot assist her with new impairment. No other issues or concerns identified.  Emotional Assessment Appearance:  Appears stated age Attitude/Demeanor/Rapport:  (Cooperative) Affect (typically observed):  Accepting, Appropriate Orientation:  Oriented to Self, Oriented to Place, Oriented to  Time, Oriented to Situation Alcohol / Substance use:  Not Applicable Psych involvement (Current and /or in the community):  No (Comment)  Discharge Needs  Concerns to be addressed:  Discharge Planning Concerns Readmission within the last 30 days:  No Current discharge risk:  Dependent with Mobility, Physical Impairment Barriers to Discharge:  No Barriers Identified   Normajean Baxter, LCSW 10/26/2017, 11:06 AM

## 2017-10-26 NOTE — Social Work (Signed)
CSW met with patient at bedside and she accepted bed offer from [facility].   CSW confirmed with SNF and CSW faxed off clinicals to initiate insurance auth for SNF placement.   , LCSW Clinical Social Worker [phone]   

## 2017-10-26 NOTE — Anesthesia Postprocedure Evaluation (Signed)
Anesthesia Post Note  Patient: Shannon [name]  Procedure(s) Performed: INTRAMEDULLARY (IM) NAIL FEMORAL (Left Leg Upper)     Patient location during evaluation: PACU Anesthesia Type: General Level of consciousness: awake and alert Pain management: pain level controlled Vital Signs Assessment: post-procedure vital signs reviewed and stable Respiratory status: spontaneous breathing, nonlabored ventilation, respiratory function stable and patient connected to nasal cannula oxygen Cardiovascular status: blood pressure returned to baseline and stable Postop Assessment: no apparent nausea or vomiting Anesthetic complications: no    Last Vitals:  Vitals:   10/25/17 2034 10/26/17 0500  BP: (!) 116/59 (!) 102/57  Pulse: (!) 107 (!) 106  Resp: 18 18  Temp: 37.1 C 37.3 C  SpO2: 95% 97%    Last Pain:  Vitals:   10/26/17 0625  TempSrc:   PainSc: [employer]

## 2017-10-26 NOTE — Discharge Summary (Signed)
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 Has decided to go to SNF.   Brief Summary: Shannon [name] a 76 y.o.femalewith medical history ofbreast CA, medullary cancer of the thryoid and anxiety who tripped and fell and then noted pain in her left hip which is worse with movement.  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 s/p resection - cont Synthroid  Prolonged QT - QT > 600 on current EKG - rechecked on 12/25 and is normal  Anxiety - BID Xanax  Breast CA - treated    Discharge Instructions  Discharge Instructions    Diet - low sodium heart healthy   Complete by:  As directed    Increase activity slowly   Complete by:  As directed    Partial weight bearing   Complete by:  As directed    % Body Weight:  50   Laterality:  left   Extremity:  Lower     Allergies as of 10/26/2017      Reactions   Codeine       Medication List    TAKE these medications   ALPRAZolam 1 MG tablet Commonly known as:  XANAX Take 1/2 tab  in the am and 1/2 tab hs prn. (per patient) What changed:    how much to take  how to take this  when to take this  additional instructions   aspirin 325 MG EC tablet Take 1 tablet (325 mg total) by mouth 2 (two) times daily. Take for 4 weeks.   Biotin 1000 MCG tablet Take 1,000 mcg by mouth daily.   CALCIUM 600 + MINERALS PO Take 1 tablet by mouth daily.   cetirizine 10 MG tablet Commonly known as:  ZYRTEC Take 10 mg by mouth daily.   Cod Liver Oil 1000 MG Caps Take 1,000 mg by mouth daily.   ferrous sulfate 325 (65 FE) MG tablet Take 1 tablet (325 mg total) by mouth 2 (two) times daily with a meal.   Glucosamine-Chondroitin 250-200 MG Caps Take 1 tablet by mouth daily.   HYDROcodone-acetaminophen 5-325 MG tablet Commonly known as:  NORCO/VICODIN Take 1-2 tablets by mouth every 6 (six) hours as needed for moderate pain.   levothyroxine 112 MCG tablet Commonly known as:  SYNTHROID, LEVOTHROID Take 112 mcg by mouth daily. /125MCG   loperamide 2 MG tablet Commonly known as:  IMODIUM A-D Take 2 mg by mouth 3 (three) times daily.   methocarbamol 500 MG tablet Commonly known as:  ROBAXIN Take 1 tablet (500 mg total) by  mouth every 6 (six) hours as needed for muscle spasms.   multivitamin tablet Take 1 tablet by mouth daily.   PRESCRIPTION MEDICATION Inject 1 application into the skin every 30 (thirty) days. To control diarrhea   RECLAST 5 MG/100ML Soln injection Generic drug:  zoledronic acid Inject 5 mg into the vein once.   simethicone 80 MG chewable tablet Commonly known as:  MYLICON Chew 80 mg by mouth 3 (three) times daily.   TYLENOL PM EXTRA STRENGTH PO Take 1 tablet by mouth at bedtime.   Vitamin D 1000 units capsule Take 1,000 Units by mouth daily.            Discharge Care Instructions  (From admission, onward)        Start     Ordered   10/25/17 0000  Partial weight bearing    Question Answer Comment  % Body Weight 50    Laterality left   Extremity Lower      10/25/17 0745      Contact information for follow-up providers    Paralee Cancel, MD. Schedule an appointment as soon as possible for a visit in 2 week(s).   Specialty:  Orthopedic Surgery Contact information: [address] [place] [address] [phone]            Contact information for after-discharge care    Destination    [facility] .   Service:  Skilled Nursing Contact information: [place] [address] [phone]                 Allergies  Allergen Reactions  . Codeine      Procedures/Studies: Dg C-arm 1-60 Min  Result Date: 10/24/2017 CLINICAL DATA:  Internal fixation of left hip fracture. EXAM: OPERATIVE LEFT HIP WITH PELVIS COMPARISON:  Left hip radiographs performed earlier today at 2:06 p.m. FINDINGS: Three fluoroscopic C-arm images are provided from the OR, demonstrating placement of a dynamic left hip screw, transfixing the left femoral intertrochanteric fracture in grossly anatomic alignment. No new fractures are seen. IMPRESSION: Status post internal fixation of left femoral intertrochanteric fracture in grossly anatomic alignment. Electronically Signed   By: Garald Balding M.D.   On: 10/24/2017 00:59   Dg Hip Operative Unilat W Or W/o Pelvis Left  Result Date: 10/24/2017 CLINICAL DATA:  Internal fixation of left hip fracture. EXAM: OPERATIVE LEFT HIP WITH PELVIS COMPARISON:  Left hip radiographs performed earlier today at 2:06 p.m. FINDINGS: Three fluoroscopic C-arm images are provided from the OR, demonstrating placement of a dynamic left hip screw, transfixing the left femoral intertrochanteric fracture in grossly anatomic alignment. No new fractures are seen. IMPRESSION: Status post internal fixation of left femoral intertrochanteric fracture in grossly anatomic alignment. Electronically Signed   By: Garald Balding M.D.   On: 10/24/2017 00:59   Dg  Hip Unilat With Pelvis 2-3 Views Left  Result Date: 10/23/2017 CLINICAL DATA:  Left hip pain after a fall today. EXAM: DG HIP (WITH OR WITHOUT PELVIS) 2-3V LEFT COMPARISON:  None. FINDINGS: There is a comminuted slightly impacted intertrochanteric fracture of the proximal left femur. The fracture extends into the proximal left femoral shaft. Osteopenia. IMPRESSION: Comminuted slightly impacted intertrochanteric fracture of the proximal left femur extending into the proximal shaft. Electronically Signed   By: Lorriane Shire M.D.   On: 10/23/2017 14:27       Discharge Exam: Vitals:   10/25/17 2034 10/26/17 0500  BP: (!) 116/59 (!) 102/57  Pulse: Marland Kitchen)  107 (!) 106  Resp: 18 18  Temp: 98.7 F (37.1 C) 99.1 F (37.3 C)  SpO2: 95% 97%   Vitals:   10/25/17 0620 10/25/17 1300 10/25/17 2034 10/26/17 0500  BP: 118/66 (!) 113/46 (!) 116/59 (!) 102/57  Pulse: (!) 102 98 (!) 107 (!) 106  Resp: 17 17 18 18   Temp: 98.1 F (36.7 C) 98 F (36.7 C) 98.7 F (37.1 C) 99.1 F (37.3 C)  TempSrc: Oral Oral Oral Oral  SpO2: 95% 98% 95% 97%  Weight:      Height:        General: Pt is alert, awake, not in acute distress Cardiovascular: RRR, S1/S2 +, no rubs, no gallops Respiratory: CTA bilaterally, no wheezing, no rhonchi Abdominal: Soft, NT, ND, bowel sounds + Extremities:  mild edema left thigh at surgical site, no cyanosis    The results of significant diagnostics from this hospitalization (including imaging, microbiology, ancillary and laboratory) are listed below for reference.     Microbiology: Recent Results (from the past 240 hour(s))  Surgical pcr screen     Status: None   Collection Time: 10/23/17  7:32 PM  Result Value Ref Range Status   MRSA, PCR NEGATIVE NEGATIVE Final   Staphylococcus aureus NEGATIVE NEGATIVE Final    Comment: (NOTE) The Xpert SA Assay (FDA approved for NASAL specimens in patients 42 years of age and older), is one component of a comprehensive surveillance  program. It is not intended to diagnose infection nor to guide or monitor treatment.      Labs: BNP (last 3 results) No results for input(s): BNP in the last 8760 hours. Basic Metabolic Panel: Recent Labs  Lab 10/23/17 1518 10/24/17 0730 10/25/17 0726  NA 142 138 141  K 3.6 4.0 3.7  CL 110 104 105  CO2 23 24 28   GLUCOSE 431* 540* 111*  BUN 12 7 7   CREATININE 0.85 0.65 0.67  CALCIUM 8.6* 8.1* 8.2*  MG  --  1.7  --    Liver Function Tests: No results for input(s): AST, ALT, ALKPHOS, BILITOT, PROT, ALBUMIN in the last 168 hours. No results for input(s): LIPASE, AMYLASE in the last 168 hours. No results for input(s): AMMONIA in the last 168 hours. CBC: Recent Labs  Lab 10/23/17 1518 10/24/17 0730  WBC 12.3* 9.0  NEUTROABS 10.9*  --   HGB 12.9 12.3  HCT 38.0 36.0  MCV 87.0 86.3  PLT 228 199   Cardiac Enzymes: No results for input(s): CKTOTAL, CKMB, CKMBINDEX, TROPONINI in the last 168 hours. BNP: Invalid input(s): POCBNP CBG: No results for input(s): GLUCAP in the last 168 hours. D-Dimer No results for input(s): DDIMER in the last 72 hours. Hgb A1c No results for input(s): HGBA1C in the last 72 hours. Lipid Profile No results for input(s): CHOL, HDL, LDLCALC, TRIG, CHOLHDL, LDLDIRECT in the last 72 hours. Thyroid function studies No results for input(s): TSH, T4TOTAL, T3FREE, THYROIDAB in the last 72 hours.  Invalid input(s): FREET3 Anemia work up No results for input(s): VITAMINB12, FOLATE, FERRITIN, TIBC, IRON, RETICCTPCT in the last 72 hours. Urinalysis    Component Value Date/Time   COLORURINE YELLOW 11/05/2009 0924   APPEARANCEUR CLEAR 11/05/2009 0924   LABSPEC 1.017 11/05/2009 0924   PHURINE 6.0 11/05/2009 0924   GLUCOSEU NEGATIVE 11/05/2009 0924   HGBUR NEGATIVE 11/05/2009 0924   BILIRUBINUR NEGATIVE 11/05/2009 0924   KETONESUR NEGATIVE 11/05/2009 0924   PROTEINUR NEGATIVE 11/05/2009 0924   UROBILINOGEN 0.2 11/05/2009 0924   NITRITE NEGATIVE  11/05/2009 0924   LEUKOCYTESUR TRACE (A) 11/05/2009 0924   Sepsis Labs Invalid input(s): PROCALCITONIN,  WBC,  LACTICIDVEN Microbiology Recent Results (from the past 240 hour(s))  Surgical pcr screen     Status: None   Collection Time: 10/23/17  7:32 PM  Result Value Ref Range Status   MRSA, PCR NEGATIVE NEGATIVE Final   Staphylococcus aureus NEGATIVE NEGATIVE Final    Comment: (NOTE) The Xpert SA Assay (FDA approved for NASAL specimens in patients 54 years of age and older), is one component of a comprehensive surveillance program. It is not intended to diagnose infection nor to guide or monitor treatment.      Time coordinating discharge: Over 30 minutes  SIGNED:   Debbe Odea, MD  [facility] 10/26/2017, 1:07 PM Pager   If 7PM-7AM, please contact night-coverage [email or web address] Password [email or web address]

## 2017-10-27 MED ORDER — ALPRAZOLAM 1 MG PO TABS: 0.5000 mg | ORAL_TABLET | Freq: Two times a day (BID) | ORAL | 0 refills | Status: DC

## 2017-10-27 NOTE — Plan of Care (Signed)
  Progressing Education: Knowledge of General Education information will improve 10/27/2017 0808 - Progressing by Rance Muir, RN Health Behavior/Discharge Planning: Ability to manage health-related needs will improve 10/27/2017 0808 - Progressing by Rance Muir, RN Clinical Measurements: Ability to maintain clinical measurements within normal limits will improve 10/27/2017 0808 - Progressing by Rance Muir, RN Will remain free from infection 10/27/2017 0808 - Progressing by Rance Muir, RN Diagnostic test results will improve 10/27/2017 0808 - Progressing by Rance Muir, RN Respiratory complications will improve 10/27/2017 0808 - Progressing by Rance Muir, RN Cardiovascular complication will be avoided 10/27/2017 0808 - Progressing by Rance Muir, RN Activity: Risk for activity intolerance will decrease 10/27/2017 0808 - Progressing by Rance Muir, RN Nutrition: Adequate nutrition will be maintained 10/27/2017 0808 - Progressing by Rance Muir, RN Coping: Level of anxiety will decrease 10/27/2017 0808 - Progressing by Rance Muir, RN Elimination: Will not experience complications related to bowel motility 10/27/2017 0808 - Progressing by Rance Muir, RN Will not experience complications related to urinary retention 10/27/2017 0808 - Progressing by Rance Muir, RN Pain Managment: General experience of comfort will improve 10/27/2017 0808 - Progressing by Rance Muir, RN Safety: Ability to remain free from injury will improve 10/27/2017 0808 - Progressing by Rance Muir, RN Skin Integrity: Risk for impaired skin integrity will decrease 10/27/2017 0808 - Progressing by Rance Muir, RN Education: Verbalization of understanding the information provided (i.e., activity precautions, restrictions, etc) will improve 10/27/2017 0808 - Progressing by Rance Muir, RN Activity: Ability to ambulate and perform ADLs will improve 10/27/2017 0808 - Progressing by Rance Muir, RN Clinical Measurements: Postoperative  complications will be avoided or minimized 10/27/2017 0808 - Progressing by Rance Muir, RN Self-Concept: Ability to maintain and perform role responsibilities to the fullest extent possible will improve 10/27/2017 0808 - Progressing by Rance Muir, RN Pain Management: Pain level will decrease 10/27/2017 0808 - Progressing by Rance Muir, RN

## 2017-10-27 NOTE — Progress Notes (Signed)
Physical Therapy Treatment Patient Details Name: Shannon [name] MRN: [id number] DOB: November 27, 1940 Today's Date: 10/27/2017    History of Present Illness Pt is a 76 y.o. female admitted 10/23/17 post-fall now s/p L femur ORIF (50% PWB). Pertinent PMH includes osteoporosis, cancer, anxiety.    PT Comments    Patient received in bed, pleasant and willing to work with skilled PT services this morning. Patient requires Min assist for supine to sit mostly due to pain and weakness L LE, able to perform functional transfers and gait with min guard however. Patient able to perform self care and cleaning while seated at side of toilet with S from PT, does require Min assist to maintain PWB status L LE with sit to stand from low toilet however. Gait trained approximately 73f this morning with improved mechanics noted, improved ability to consistently place L foot flat on floor instead of performing toe touch pattern. Patient left sitting at EOB with RN present and attending, all needs otherwise met.     Follow Up Recommendations  Supervision for mobility/OOB;SNF;Home health PT     Equipment Recommendations  3in1 (PT)    Recommendations for Other Services       Precautions / Restrictions Precautions Precautions: Fall Restrictions Weight Bearing Restrictions: Yes LLE Weight Bearing: Partial weight bearing LLE Partial Weight Bearing Percentage or Pounds: 50    Mobility  Bed Mobility Overal bed mobility: Needs Assistance Bed Mobility: Supine to Sit     Supine to sit: Min assist     General bed mobility comments: Min assist for management of L LE and to power up trunk   Transfers Overall transfer level: Needs assistance Equipment used: Rolling walker (2 wheeled) Transfers: Sit to/from Stand Sit to Stand: Min guard;Min assist         General transfer comment: Min assist from lower surfaces , Min guard otherwise   Ambulation/Gait Ambulation/Gait assistance: Min guard Ambulation  Distance (Feet): 90 Feet Assistive device: Rolling walker (2 wheeled) Gait Pattern/deviations: Step-to pattern;Decreased step length - right;Decreased stance time - left;Decreased dorsiflexion - left;Decreased weight shift to left;Trunk flexed;Narrow base of support Gait velocity: Decreased   General Gait Details: continues with hop to pattern with RW, however displays improved control with UEs and has less forceful landing on R heel with gait; gait distance continues to be limited by fatigue. Improving in consistency in putting L foot on floor/reducing toe touch pattern    Stairs            Wheelchair Mobility    Modified Rankin (Stroke Patients Only)       Balance Overall balance assessment: Needs assistance Sitting-balance support: Bilateral upper extremity supported;Feet supported Sitting balance-Leahy Scale: Good     Standing balance support: Bilateral upper extremity supported;During functional activity Standing balance-Leahy Scale: Fair                              Cognition Arousal/Alertness: Awake/alert Behavior During Therapy: WFL for tasks assessed/performed Overall Cognitive Status: Within Functional Limits for tasks assessed                                        Exercises      General Comments        Pertinent Vitals/Pain Pain Assessment: No/denies pain Pain Score: 0-No pain Pain Intervention(s): Monitored during session;Limited activity within patient's tolerance  Home Living                      Prior Function            PT Goals (current goals can now be found in the care plan section) Acute Rehab PT Goals Patient Stated Goal: Return home PT Goal Formulation: With patient Time For Goal Achievement: 11/07/17 Potential to Achieve Goals: Good Progress towards PT goals: Progressing toward goals    Frequency    Min 5X/week      PT Plan Current plan remains appropriate    Co-evaluation               AM-PAC PT "6 Clicks" Daily Activity  Outcome Measure  Difficulty turning over in bed (including adjusting bedclothes, sheets and blankets)?: Unable Difficulty moving from lying on back to sitting on the side of the bed? : Unable Difficulty sitting down on and standing up from a chair with arms (e.g., wheelchair, bedside commode, etc,.)?: Unable Help needed moving to and from a bed to chair (including a wheelchair)?: None Help needed walking in hospital room?: None Help needed climbing 3-5 steps with a railing? : A Little 6 Click Score: 14    End of Session   Activity Tolerance: Patient tolerated treatment well Patient left: in bed;with nursing/sitter in room;with call bell/phone within reach(sitting at side of bed with RN attending ) Nurse Communication: Mobility status PT Visit Diagnosis: Other abnormalities of gait and mobility (R26.89);Pain Pain - Right/Left: Left Pain - part of body: Leg     Time: 8323-4688 PT Time Calculation (min) (ACUTE ONLY): 26 min  Charges:  $Gait Training: 8-22 mins $Therapeutic Activity: 8-22 mins                    G Codes:       Deniece Ree PT, DPT, CBIS  Supplemental Physical Therapist [place]

## 2017-10-27 NOTE — Discharge Summary (Signed)
Physician Discharge Summary  Shannon [name] NID:[id number] DOB: [birth year] DOA: 10/23/2017  This patient seen and breifly reviewed Pain is slight and needing robaxin but is ready for d/c to SNF when available  "Ortho recommendations:  ASA 325 mg bid for 4 weeks for anticoagulation, unless other medically indicated.  Norco for pain management (Rx written).  MiraLax and Colace for constipation  Iron 325 mg tid for 2-3 weeks   PWB 50% on the left leg.  Dressing to remain in place for .  Dressing is waterproof and may shower with it in place.  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 Has decided to go to SNF.   Brief Summary: Shannon [name] a 76 y.o.femalewith medical history ofbreast CA, medullary cancer of the thryoid and anxiety who tripped and fell and then noted pain in her left hip which is worse with movement.  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 s/p resection - cont Synthroid  Prolonged QT - QT > 600 on current EKG - rechecked on 12/25 and is  normal  Anxiety - BID Xanax  Breast CA - treated    Discharge Instructions  Discharge Instructions    Diet - low sodium heart healthy   Complete by:  As directed    Increase activity slowly   Complete by:  As directed    Partial weight bearing   Complete by:  As directed    % Body Weight:  50   Laterality:  left   Extremity:  Lower     Allergies as of 10/27/2017      Reactions   Codeine       Medication List    TAKE these medications   ALPRAZolam 1 MG tablet Commonly known as:  XANAX Take 1/2 tab in the am and 1/2 tab hs prn. (per patient) What changed:    how much to take  how to take this  when to take this  additional instructions   aspirin 325 MG EC tablet Take 1 tablet (325 mg total) by mouth 2 (two) times daily. Take for 4 weeks.   Biotin 1000 MCG tablet Take 1,000 mcg by mouth daily.   CALCIUM 600 + MINERALS PO Take 1 tablet by mouth daily.   cetirizine 10 MG tablet Commonly known as:  ZYRTEC Take 10 mg by mouth daily.   Cod Liver Oil 1000 MG Caps Take 1,000 mg by mouth daily.   ferrous sulfate 325 (65 FE) MG tablet Take 1 tablet (325 mg total) by mouth 2 (two)  times daily with a meal.   Glucosamine-Chondroitin 250-200 MG Caps Take 1 tablet by mouth daily.   HYDROcodone-acetaminophen 5-325 MG tablet Commonly known as:  NORCO/VICODIN Take 1-2 tablets by mouth every 6 (six) hours as needed for moderate pain.   levothyroxine 112 MCG tablet Commonly known as:  SYNTHROID, LEVOTHROID Take 112 mcg by mouth daily. /125MCG   loperamide 2 MG tablet Commonly known as:  IMODIUM A-D Take 2 mg by mouth 3 (three) times daily.   methocarbamol 500 MG tablet Commonly known as:  ROBAXIN Take 1 tablet (500 mg total) by mouth every 6 (six) hours as needed for muscle spasms.   multivitamin tablet Take 1 tablet by mouth daily.   PRESCRIPTION MEDICATION Inject 1 application into the skin every 30 (thirty) days. To control diarrhea   RECLAST  5 MG/100ML Soln injection Generic drug:  zoledronic acid Inject 5 mg into the vein once.   simethicone 80 MG chewable tablet Commonly known as:  MYLICON Chew 80 mg by mouth 3 (three) times daily.   TYLENOL PM EXTRA STRENGTH PO Take 1 tablet by mouth at bedtime.   Vitamin D 1000 units capsule Take 1,000 Units by mouth daily.            Discharge Care Instructions  (From admission, onward)        Start     Ordered   10/25/17 0000  Partial weight bearing    Question Answer Comment  % Body Weight 50   Laterality left   Extremity Lower      10/25/17 0745      Contact information for follow-up providers    Paralee Cancel, MD. Schedule an appointment as soon as possible for a visit in 2 week(s).   Specialty:  Orthopedic Surgery Contact information: [address] [place] [address] [phone]            Contact information for after-discharge care    Destination    [facility] .   Service:  Skilled Nursing Contact information: [place] [address] [phone]                 Allergies  Allergen Reactions  . Codeine      Procedures/Studies: Dg C-arm 1-60 Min  Result Date: 10/24/2017 CLINICAL DATA:  Internal fixation of left hip fracture. EXAM: OPERATIVE LEFT HIP WITH PELVIS COMPARISON:  Left hip radiographs performed earlier today at 2:06 p.m. FINDINGS: Three fluoroscopic C-arm images are provided from the OR, demonstrating placement of a dynamic left hip screw, transfixing the left femoral intertrochanteric fracture in grossly anatomic alignment. No new fractures are seen. IMPRESSION: Status post internal fixation of left femoral intertrochanteric fracture in grossly anatomic alignment. Electronically Signed   By: Garald Balding M.D.   On: 10/24/2017 00:59   Dg Hip Operative Unilat W Or W/o Pelvis Left  Result Date: 10/24/2017 CLINICAL DATA:  Internal fixation of left hip fracture. EXAM:  OPERATIVE LEFT HIP WITH PELVIS COMPARISON:  Left hip radiographs performed earlier today at 2:06 p.m. FINDINGS: Three fluoroscopic C-arm images are provided from the OR, demonstrating placement of a dynamic left hip screw, transfixing the left femoral intertrochanteric fracture in grossly anatomic alignment. No new fractures are seen. IMPRESSION: Status post internal fixation of left femoral intertrochanteric fracture in grossly anatomic alignment. Electronically Signed   By: Garald Balding M.D.   On: 10/24/2017 00:59   Dg Hip Unilat With Pelvis 2-3 Views Left  Result Date: 10/23/2017  CLINICAL DATA:  Left hip pain after a fall today. EXAM: DG HIP (WITH OR WITHOUT PELVIS) 2-3V LEFT COMPARISON:  None. FINDINGS: There is a comminuted slightly impacted intertrochanteric fracture of the proximal left femur. The fracture extends into the proximal left femoral shaft. Osteopenia. IMPRESSION: Comminuted slightly impacted intertrochanteric fracture of the proximal left femur extending into the proximal shaft. Electronically Signed   By: Lorriane Shire M.D.   On: 10/23/2017 14:27       Discharge Exam: Vitals:   10/26/17 2009 10/27/17 0548  BP: (!) 122/59 (!) 118/54  Pulse: (!) 106 95  Resp: 18 18  Temp: 99.1 F (37.3 C) 98.4 F (36.9 C)  SpO2: 96% 96%   Vitals:   10/25/17 2034 10/26/17 0500 10/26/17 2009 10/27/17 0548  BP: (!) 116/59 (!) 102/57 (!) 122/59 (!) 118/54  Pulse: (!) 107 (!) 106 (!) 106 95  Resp: 18 18 18 18   Temp: 98.7 F (37.1 C) 99.1 F (37.3 C) 99.1 F (37.3 C) 98.4 F (36.9 C)  TempSrc: Oral Oral Oral Oral  SpO2: 95% 97% 96% 96%  Weight:      Height:        General: Pt is alert, awake, not in acute distress Cardiovascular: RRR, S1/S2 +, no rubs, no gallops Respiratory: CTA bilaterally, no wheezing, no rhonchi Abdominal: Soft, NT, ND, bowel sounds + Extremities:  mild edema left thigh at surgical site, no cyanosis    The results of significant diagnostics from this  hospitalization (including imaging, microbiology, ancillary and laboratory) are listed below for reference.     Microbiology: Recent Results (from the past 240 hour(s))  Surgical pcr screen     Status: None   Collection Time: 10/23/17  7:32 PM  Result Value Ref Range Status   MRSA, PCR NEGATIVE NEGATIVE Final   Staphylococcus aureus NEGATIVE NEGATIVE Final    Comment: (NOTE) The Xpert SA Assay (FDA approved for NASAL specimens in patients 71 years of age and older), is one component of a comprehensive surveillance program. It is not intended to diagnose infection nor to guide or monitor treatment.      Labs: BNP (last 3 results) No results for input(s): BNP in the last 8760 hours. Basic Metabolic Panel: Recent Labs  Lab 10/23/17 1518 10/24/17 0730 10/25/17 0726  NA 142 138 141  K 3.6 4.0 3.7  CL 110 104 105  CO2 23 24 28   GLUCOSE 117* 189* 111*  BUN 12 7 7   CREATININE 0.85 0.65 0.67  CALCIUM 8.6* 8.1* 8.2*  MG  --  1.7  --    Liver Function Tests: No results for input(s): AST, ALT, ALKPHOS, BILITOT, PROT, ALBUMIN in the last 168 hours. No results for input(s): LIPASE, AMYLASE in the last 168 hours. No results for input(s): AMMONIA in the last 168 hours. CBC: Recent Labs  Lab 10/23/17 1518 10/24/17 0730  WBC 12.3* 9.0  NEUTROABS 10.9*  --   HGB 12.9 12.3  HCT 38.0 36.0  MCV 87.0 86.3  PLT 228 199   Cardiac Enzymes: No results for input(s): CKTOTAL, CKMB, CKMBINDEX, TROPONINI in the last 168 hours. BNP: Invalid input(s): POCBNP CBG: No results for input(s): GLUCAP in the last 168 hours. D-Dimer No results for input(s): DDIMER in the last 72 hours. Hgb A1c No results for input(s): HGBA1C in the last 72 hours. Lipid Profile No results for input(s): CHOL, HDL, LDLCALC, TRIG, CHOLHDL, LDLDIRECT in the last 72 hours. Thyroid function studies No results for input(s): TSH, T4TOTAL,  T3FREE, THYROIDAB in the last 72 hours.  Invalid input(s): FREET3 Anemia  work up No results for input(s): VITAMINB12, FOLATE, FERRITIN, TIBC, IRON, RETICCTPCT in the last 72 hours. Urinalysis    Component Value Date/Time   COLORURINE YELLOW 11/05/2009 [place] 11/05/2009 0924   LABSPEC 1.017 11/05/2009 0924   PHURINE 6.0 11/05/2009 0924   GLUCOSEU NEGATIVE 11/05/2009 0924   HGBUR NEGATIVE 11/05/2009 0924   BILIRUBINUR NEGATIVE 11/05/2009 0924   KETONESUR NEGATIVE 11/05/2009 0924   PROTEINUR NEGATIVE 11/05/2009 0924   UROBILINOGEN 0.2 11/05/2009 0924   NITRITE NEGATIVE 11/05/2009 0924   LEUKOCYTESUR TRACE (A) 11/05/2009 0924   Sepsis Labs Invalid input(s): PROCALCITONIN,  WBC,  LACTICIDVEN Microbiology Recent Results (from the past 240 hour(s))  Surgical pcr screen     Status: None   Collection Time: 10/23/17  7:32 PM  Result Value Ref Range Status   MRSA, PCR NEGATIVE NEGATIVE Final   Staphylococcus aureus NEGATIVE NEGATIVE Final    Comment: (NOTE) The Xpert SA Assay (FDA approved for NASAL specimens in patients 58 years of age and older), is one component of a comprehensive surveillance program. It is not intended to diagnose infection nor to guide or monitor treatment.      Time coordinating discharge: Over 30 minutes  SIGNED:   Nita Sells, MD  [facility] 10/27/2017, 12:06 PM Pager   If 7PM-7AM, please contact night-coverage [email or web address] Password [email or web address]

## 2017-10-27 NOTE — Plan of Care (Signed)
  Adequate for Discharge Education: Knowledge of General Education information will improve 10/27/2017 1323 - Adequate for Discharge by Rance Muir, RN 10/27/2017 0808 - Progressing by Rance Muir, RN Health Behavior/Discharge Planning: Ability to manage health-related needs will improve 10/27/2017 1323 - Adequate for Discharge by Rance Muir, RN 10/27/2017 0808 - Progressing by Rance Muir, RN Clinical Measurements: Ability to maintain clinical measurements within normal limits will improve 10/27/2017 1323 - Adequate for Discharge by Rance Muir, RN 10/27/2017 0808 - Progressing by Rance Muir, RN Will remain free from infection 10/27/2017 1323 - Adequate for Discharge by Rance Muir, RN 10/27/2017 0808 - Progressing by Rance Muir, RN Diagnostic test results will improve 10/27/2017 1323 - Adequate for Discharge by Rance Muir, RN 10/27/2017 0808 - Progressing by Rance Muir, RN Respiratory complications will improve 10/27/2017 1323 - Adequate for Discharge by Rance Muir, RN 10/27/2017 0808 - Progressing by Rance Muir, RN Cardiovascular complication will be avoided 10/27/2017 1323 - Adequate for Discharge by Rance Muir, RN 10/27/2017 0808 - Progressing by Rance Muir, RN Activity: Risk for activity intolerance will decrease 10/27/2017 1323 - Adequate for Discharge by Rance Muir, RN 10/27/2017 0808 - Progressing by Rance Muir, RN Nutrition: Adequate nutrition will be maintained 10/27/2017 1323 - Adequate for Discharge by Rance Muir, RN 10/27/2017 0808 - Progressing by Rance Muir, RN Coping: Level of anxiety will decrease 10/27/2017 1323 - Adequate for Discharge by Rance Muir, RN 10/27/2017 0808 - Progressing by Rance Muir, RN Elimination: Will not experience complications related to bowel motility 10/27/2017 1323 - Adequate for Discharge by Rance Muir, RN 10/27/2017 0808 - Progressing by Rance Muir, RN Will not experience complications related to urinary retention 10/27/2017 1323 - Adequate for Discharge by  Rance Muir, RN 10/27/2017 0808 - Progressing by Rance Muir, RN Pain Managment: General experience of comfort will improve 10/27/2017 1323 - Adequate for Discharge by Rance Muir, RN 10/27/2017 0808 - Progressing by Rance Muir, RN Safety: Ability to remain free from injury will improve 10/27/2017 1323 - Adequate for Discharge by Rance Muir, RN 10/27/2017 0808 - Progressing by Rance Muir, RN Skin Integrity: Risk for impaired skin integrity will decrease 10/27/2017 1323 - Adequate for Discharge by Rance Muir, RN 10/27/2017 0808 - Progressing by Rance Muir, RN Education: Verbalization of understanding the information provided (i.e., activity precautions, restrictions, etc) will improve 10/27/2017 1323 - Adequate for Discharge by Rance Muir, RN 10/27/2017 0808 - Progressing by Rance Muir, RN Activity: Ability to ambulate and perform ADLs will improve 10/27/2017 1323 - Adequate for Discharge by Rance Muir, RN 10/27/2017 0808 - Progressing by Rance Muir, RN Clinical Measurements: Postoperative complications will be avoided or minimized 10/27/2017 1323 - Adequate for Discharge by Rance Muir, RN 10/27/2017 0808 - Progressing by Rance Muir, RN Self-Concept: Ability to maintain and perform role responsibilities to the fullest extent possible will improve 10/27/2017 1323 - Adequate for Discharge by Rance Muir, RN 10/27/2017 0808 - Progressing by Rance Muir, RN Pain Management: Pain level will decrease 10/27/2017 1323 - Adequate for Discharge by Rance Muir, RN 10/27/2017 5631 - Progressing by Rance Muir, RN

## 2017-10-27 NOTE — Care Management Important Message (Signed)
Important Message  Patient Details  Name: Shannon [name] MRN: [id number] Date of Birth: 06-Apr-1941   Medicare Important Message Given:  Yes    Samrat Hayward [address] 10/27/2017, 12:48 PM

## 2017-10-27 NOTE — Social Work (Addendum)
CSW received Insurance Auth [id number], 12/30 updates due, [id number] minutes, Jenny Reichmann at [facility]  CSW will  Follow up for discharge to [facility].  Elissa Hefty, LCSW Clinical Social Worker [phone]

## 2017-10-27 NOTE — Clinical Social Work Placement (Signed)
   CLINICAL SOCIAL WORK PLACEMENT  NOTE  Date:  10/27/2017  Patient Details  Name: Shannon [name] MRN: [id number] Date of Birth: 01-15-1941  Clinical Social Work is seeking post-discharge placement for this patient at the [place] level of care (*CSW will initial, date and re-position this form in  chart as items are completed):      Patient/family provided with [place] Work Department's list of facilities offering this level of care within the geographic area requested by the patient (or if unable, by the patient's family).  Yes   Patient/family informed of their freedom to choose among providers that offer the needed level of care, that participate in Medicare, Medicaid or managed care program needed by the patient, have an available bed and are willing to accept the patient.  Yes   Patient/family informed of [facility]'s ownership interest in [facility] and [facility], as well as of the fact that they are under no obligation to receive care at these facilities.  PASRR submitted to EDS on       PASRR number received on 10/25/17     Existing PASRR number confirmed on       FL2 transmitted to all facilities in geographic area requested by pt/family on 10/25/17     FL2 transmitted to all facilities within larger geographic area on       Patient informed that his/her managed care company has contracts with or will negotiate with certain facilities, including the following:        Yes   Patient/family informed of bed offers received.  Patient chooses bed at [facility]     Physician recommends and patient chooses bed at      Patient to be transferred to [facility] on 10/27/17.  Patient to be transferred to facility by [facility]     Patient family notified on 10/27/17 of transfer.  Name of family member notified:  pt responsible for self     PHYSICIAN       Additional Comment:     _______________________________________________ Normajean Baxter, LCSW 10/27/2017, 12:13 PM

## 2017-10-27 NOTE — Social Work (Signed)
Clinical Social Worker facilitated patient discharge including contacting patient family and facility to confirm patient discharge plans.  Clinical information faxed to facility and family agreeable with plan.   CSW arranged ambulance transport via [facility] to [facility].   RN to call [phone] to give report prior to discharge. Room 606P.  Clinical Social Worker will sign off for now as social work intervention is no longer needed. Please consult [place] again if new need arises.  Elissa Hefty, LCSW Clinical Social Worker [phone]

## 2017-10-27 NOTE — Progress Notes (Signed)
Report called to [facility] and given to [place]ie RN at this time

## 2017-11-07 ENCOUNTER — Telehealth: Payer: Self-pay | Admitting: Hematology

## 2017-11-07 NOTE — Telephone Encounter (Signed)
I have attempted to contact patient regarding appointment but no voicemail available and I am not aware of the access code that is needed.  I have called referring MD at [facility] and left voicemail as well with appointment info

## 2017-11-15 ENCOUNTER — Ambulatory Visit: Payer: Medicare Other | Admitting: Hematology

## 2017-11-15 ENCOUNTER — Telehealth: Payer: Self-pay | Admitting: Hematology

## 2017-11-15 NOTE — Telephone Encounter (Signed)
Called patient to reschedule her new patient appointment she didn't want too early. Patient broke her leg couldn't make 1/16

## 2017-12-11 NOTE — Progress Notes (Signed)
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 total thyroidectomy in January of 2011 and bilateral neck dissection in June of 2011.  B. Persistent hypercalcitoninemia postoperatively. Calcitonin 499 in 07/2011  C. December 2012: CT of neck/chest was negative for metastases D. She had a normal neck ultrasound in 01/2012 showing her cystic LN was smaller (the one biopsied and negative).  E. FDG PET 08/2012 due to her last calcitonin raising substantially to 739 from the 500s: concerning right cervical lymph node F. 11/08/2012: Level 6 LN dissection. She had 2/2 LN positive, largest 2cm for MTC. Calcitonin one week later from her surgery was down to 515.  G. Calcitonin 592 on 05/24/2013. Repeat ultrasound on that same day which showed 2 at least partially cystic lymph nodes at level 4 which had been previously biopsied and negative; decreased in size since 2012. It also showed a subcentimeter right level VB LN of low suspicion. H. Her metanephrines (plasma) and PTH were normal in 12/2013 Urine studies for pheo were negative in 12/2013. I. 08/04/2014: CEA 9.0; calcitonin 1019 J. 09/05/2014:  - C/A/P CT scan: Interval development of a new calcified nodule in the left lower lobe. - Bone scan: Slight asymmetric increased radiotracer uptake in the right lateral calvarium on the frontal radiograph - Neck U/S: Stable right lateral compartment level 4 and 5B lymph nodes. The cystic right level 4 lymph node, previously biopsied on 01/14/2011 with negative pathology results.  K. 09/23/2014: Thoracoscopy: level 9 LN: Metastatic medullary thyroid carcinoma.  L. 03/11/2015:  CEA 15.9; calcitonin 1149 M 04-13-2015:  - C/A/P CT scan: Previously reported left lower lobe pulmonary nodule is no longer identified. No new or enlarging pulmonary nodules. No evidence of metastatic disease in the abdomen or pelvis. - CT brain:No acute intracranial abnormalities.  New small CSF density subdural fluid collection overlying the right cerebral hemisphere, likely a small hygroma of uncertain etiology. There is no associated bony or parenchymal lesion. 04-13-2015: Bone scan: A non-specific, very small focus of increased tracer activity in the right frontal calvarium is not significantly changed from the prior study. - Neck U/S: There is hypoechoic lesion with questionable microcalcification located in right level IV, it is 0.4 x 0.6 x 0.9 cm. No other pathologic cervical lymphadenopathy seen.  N. 04/13/2015: CEA 14.6; calcitonin 1324 O. 07/16/2015 ([facility]): CEA 19.4 (outsode of [facility]); CAlcitonin 1860. P. 09-07-2015: Neck, Chest , Abd, [place] CT: Stable post-thyroidectomy changes without evidence of recurrent or metastatic disease. Stable pulmonary nodules. Hepatic cysts. No bone mets. CEA: 16; Calcitonin 1533 Q. 03-08-2016: C/A/P CT scan: No definite evidence of metastatic disease in the chest, abdomen or pelvis. Compared to 09/07/2015, there is a new linear 3 mm right lower lobe pulmonary nodule which is indeterminate and may represent focal atelectasis CEA 16.5; CAlcitonin 1839  R. 08-22-2016: Calcitonin: 4596; CEA 27.1  S. 09-06-2016:  - Bone scan: Redemonstrated likely two foci of increased radiotracer uptake within the right calvarium, similar to prior study. - Neck CT: No evidence of recurrent or metastatic disease in the neck. - C/A/P CT: No definite evidence of metastatic disease in the chest, abdomen, or pelvis. - Neck CT: Status post thyroidectomy and lymph  node dissection without evidence of recurrent or metastatic disease.  T. Bone scan: A focus of increased tracer activity  projecting over the posterior right approximate 8th rib is non-specific; this finding may relate to summation of tracer activity in the rib with the inferior tip of the overlying right scapula, post-traumatic change or skeletal metastasis; recommend attention on follow-up. Re-demonstration of subtly increased tracer activity in the right frontal calvarium, stable since 09/05/2014. U. 02-13-2017:  - C/A/P CT scan: nonenlarged mediastinal lymph nodes and soft tissue nodule in the right thyroidectomy bed. Unchanged irregular opacities in the lung apices bilaterally, which may represent treated metastases or scarring.  - Neck CT: Numerous soft tissue enhancing lesions in the superior mediastinum. While these are stable when compared to most recent CT, several of these lesions demonstrate minimal growth from the more remote exam on 09/07/2015. No new enhancing lesions are present. - CEA 25.1, Calcitonin: 5498 V. 08-28-2017: fractionate plasma free metanephrines: Normetanephrine 178 (24hr urine pending as this was thought to be non-specific), Metanephrine: 44 (normal). CEA 46; Calcitonin 9763 W. 09-27-2017: Bone scan: Stable increased radiotracer activity in the right frontal calvarium, indeterminate. No other evidence of osseous metastasis Chest CT/Abd: Mildly enlarged mediastinal lymph nodes are stable. No evidence of metastatic disease in the abdomen   HISTORY OF PRESENTING ILLNESS:   Shannon [name] is a wonderful 77 y.o. female who has been referred to [place] by [place] oncologist Dr. Leslie Andrea for management of Metastatic Medullary Thyroid Cancer. She is looking for a Financial controller in [place].  Today she is accompanied by her sister. Patient notes this started with a nodule in her throat and her endocrinologist started further work up. She has been seeing Med onc at [facility] since her first surgery every 3-6 months.   She has had thyroid surgery and Ldissection but no systemic therapy as  detailed above in the oncologic history.  She notes her family had testing for MEN2A mutation. Her sister, her nephew carry the gene. She notes her maternal aunt had goiters but not sure of the cause.   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 She has had diarrhea since 2010. Her last colonoscopy was in 2010. She has tried multiple treatment options but this continues to worsen over time. She currently takes imodium 4-6 X daily, cholestyramine twice daily. She started monthly Sandostatin on 10/20/17. She fell and broke her hip on 10/23/17 and had surgery same day. She was given hydrocodone which helped her diarrhea and is not sure if the Sandostatin is working for her. She has been on oral iron and she is on aspirin 381m since her hip surgery. She does PT exercises and has been healing well.    She recently stopped caffeine tablets that she was taking for energy. She stopped 3 months ago. She is on Reclast for her osteoporosis, which is managed by her PCP, Dr. KDeatra Ina Pt notes she stopped smoking about 40 years ago and she rarely drinks alcohol.   On review of symptoms, pt notes continued diarrhea, stable energy, weight loss of 6-8 pounds in the last 6 months. Her appetite is adequate. Her diarrhea occurs currently twice a day with "pudding" form to liquid form stool. She can go up to 5-7 times a day at times. She denies cramping with diarrhea. She denies blood, and mucus in stool. Current use of oral iron causes some black stool, which was not present before use.    MEDICAL HISTORY:  Past Medical History:  Diagnosis Date  . Anxiety   . Breast cancer ([place]) 1999   left lumpectomy/radiation/ductal carcinoma in situ  . Cancer ([place])   . Colon polyp   . Contact lens/glasses fitting    HAS LENS IMPLANTS  . Lung cancer ([place]) 2015   LYMPHNODE REMOVAL   . Medullary carcinoma of thyroid ([place]) 2011  . Osteoporosis     SURGICAL HISTORY: Past Surgical History:    Procedure Laterality Date  . ABDOMINAL SURGERY  1989   RUPTURED APPENDIX  . BREAST SURGERY  1999   LEFT BREAST LUMPECTOMY. DUCTAL CARCINOMA IN SITU./ RADIATION  . CATARACT EXTRACTION W/ INTRAOCULAR LENS IMPLANT  2009   BOTH EYES  . FEMUR IM NAIL Left 10/23/2017   Procedure: INTRAMEDULLARY (IM) NAIL FEMORAL;  Surgeon: Paralee Cancel, MD;  Location: [facility];  Service: Orthopedics;  Laterality: Left;  . FOOT SURGERY  1070   RIGHT FOOT - PINCHED NERVE  . LUNG SURGERY     CALCIFICATIONS, POSITIVE - CANCER LYMPHNODE   . MELANOMA EXCISION  2008   CHEST AREA  . NECK SURGERY  Nov 09 2011   LYMPHNODES REMOVED 2 POSITIVE   . RIGHT COLECTOMY  06/02/2009  . THYROIDECTOMY  11/06/2009  . TONSILLECTOMY AND ADENOIDECTOMY      SOCIAL HISTORY: Social History   Socioeconomic History  . Marital status: Married    Spouse name: Not on file  . Number of children: Not on file  . Years of education: Not on file  . Highest education level: Not on file  Social Needs  . Financial resource strain: Not on file  . Food insecurity - worry: Not on file  . Food insecurity - inability: Not on file  . Transportation needs - medical: Not on file  . Transportation needs - non-medical: Not on file  Occupational History  . Not on file  Tobacco Use  . Smoking status: Former Smoker    Last attempt to quit: 08/10/1960    Years since quitting: 57.3  . Smokeless tobacco: Never Used  Substance and Sexual Activity  . Alcohol use: No    Alcohol/week: 0.0 oz  . Drug use: No  . Sexual activity: No  Other Topics Concern  . Not on file  Social History Narrative  . Not on file    FAMILY HISTORY: Family History  Problem Relation Age of Onset  . Cancer Father        throat/ bone  . Breast cancer Sister   . Cancer Sister        MEDULAR CANCER  . Cancer Brother        prostate  . Cancer Other        MEDULAR CANCER  . Breast cancer Maternal Aunt   . Cancer Paternal Aunt        pancreatic   . Cancer Cousin         ON MOTHER'S SIDE- LIVER CANCER    ALLERGIES:  is allergic to codeine.  MEDICATIONS:  Current Outpatient Medications  Medication Sig Dispense Refill  . ALPRAZolam (XANAX) 1 MG tablet Take 0.5 tablets (0.5 mg total) by mouth 2 (two) times daily. 6 tablet 0  . Biotin 1000 MCG tablet Take 1,000 mcg by mouth daily.     . Calcium Carbonate-Vit D-Min (CALCIUM 600 + MINERALS PO) Take 1 tablet by mouth daily.     . cetirizine (ZYRTEC) 10 MG tablet Take 10 mg by mouth daily.    . Cholecalciferol (VITAMIN  D) 1000 UNITS capsule Take 1,000 Units by mouth daily.      Marland Kitchen Cod Liver Oil 1000 MG CAPS Take 1,000 mg by mouth daily.     . Diphenhydramine-APAP, sleep, (TYLENOL PM EXTRA STRENGTH PO) Take 1 tablet by mouth at bedtime.     . ferrous sulfate 325 (65 FE) MG tablet Take 1 tablet (325 mg total) by mouth 2 (two) times daily with a meal. 60 tablet 3  . Glucosamine-Chondroitin 250-200 MG CAPS Take 1 tablet by mouth daily.    Marland Kitchen HYDROcodone-acetaminophen (NORCO/VICODIN) 5-325 MG tablet Take 1-2 tablets by mouth every 6 (six) hours as needed for moderate pain. 40 tablet 0  . levothyroxine (SYNTHROID, LEVOTHROID) 112 MCG tablet Take 112 mcg by mouth daily. /125MCG    . loperamide (IMODIUM A-D) 2 MG tablet Take 2 mg by mouth 3 (three) times daily.    . methocarbamol (ROBAXIN) 500 MG tablet Take 1 tablet (500 mg total) by mouth every 6 (six) hours as needed for muscle spasms. 30 tablet 0  . Multiple Vitamin (MULTIVITAMIN) tablet Take 1 tablet by mouth daily.      Marland Kitchen PRESCRIPTION MEDICATION Inject 1 application into the skin every 30 (thirty) days. To control diarrhea    . simethicone (MYLICON) 80 MG chewable tablet Chew 80 mg by mouth 3 (three) times daily.     . zoledronic acid (RECLAST) 5 MG/100ML SOLN Inject 5 mg into the vein once.       No current facility-administered medications for this visit.     REVIEW OF SYSTEMS:    10 Point review of Systems was done is negative except as noted  above.  PHYSICAL EXAMINATION: ECOG PERFORMANCE STATUS: 1 - Symptomatic but completely ambulatory   Vitals:   12/12/17 1134  BP: 128/72  Pulse: 91  Resp: 18  Temp: 98 F (36.7 C)  TempSrc: Oral  SpO2: 98%  Weight: 102 lb 3.2 oz (46.4 kg)  Height: 5' 3"  (1.6 m)    GENERAL:alert, in no acute distress and comfortable SKIN: no acute rashes, no significant lesions EYES: conjunctiva are pink and non-injected, sclera anicteric OROPHARYNX: MMM, no exudates, no oropharyngeal erythema or ulceration NECK: supple, no JVD LYMPH:  no palpable lymphadenopathy in the cervical, axillary or inguinal regions LUNGS: clear to auscultation b/l with normal respiratory effort HEART: regular rate & rhythm ABDOMEN:  normoactive bowel sounds , non tender, not distended. Extremity: no pedal edema PSYCH: alert & oriented x 3 with fluent speech NEURO: no focal motor/sensory deficits  LABORATORY DATA:  I have reviewed the data as listed  . CBC Latest Ref Rng & Units 12/12/2017 10/24/2017 10/23/2017  WBC 3.9 - 10.3 K/uL 5.1 9.0 12.3(H)  Hemoglobin 12.0 - 15.0 g/dL - 12.3 12.9  Hematocrit 34.8 - 46.6 % 40.4 36.0 38.0  Platelets 145 - 400 K/uL 186 199 228  hgb 13.2  . CMP Latest Ref Rng & Units 12/12/2017 10/25/2017 10/24/2017  Glucose 70 - 140 mg/dL 89 111(H) 189(H)  BUN 7 - 26 mg/dL 16 7 7   Creatinine 0.60 - 1.10 mg/dL 0.83 0.67 0.65  Sodium 136 - 145 mmol/L 144 141 138  Potassium 3.5 - 5.1 mmol/L 4.8 3.7 4.0  Chloride 98 - 109 mmol/L 107 105 104  CO2 22 - 29 mmol/L 28 28 24   Calcium 8.4 - 10.4 mg/dL 9.3 8.2(L) 8.1(L)  Total Protein 6.4 - 8.3 g/dL 7.0 - -  Total Bilirubin 0.2 - 1.2 mg/dL 0.5 - -  Alkaline Phos 40 -  150 U/L 101 - -  AST 5 - 34 U/L 19 - -  ALT 0 - 55 U/L 10 - -   PATHOLOGY  Lung Biopsy at [facility] 09/23/14 DIAGNOSIS A. Pleural nodule, biopsy:  Calcified and hyalinized remote fat necrosis. No evidence of malignancy.   B. Level 9 lymph node, biopsy:  Metastatic medullary  thyroid carcinoma. See comment.  Comment:: A note is made of the patient's history of medullary thyroid carcinoma with documented prior metastatic disease to cervical lymph nodes ([id number]; 04/05/2010).  The prior metastases to cervical lymph nodes are reviewed in conjunction to the current case and the tumors are morphologically similar.   Confirmatory immunohistochemical stains are performed. Tumor cells stain positively for calcitonin and for an anticytokeratin cocktail. The morphologic and immunophenotypic findings support metastatic medullary thyroid carcinoma.   C. Level 8 lymph node, biopsy:  Profiles of lymph nodes, negative for malignancy.   D. Pleural nodule #2, biopsy:  Fibrovascular tissue with extensive cauterization. No evidence of malignancy.        Surgical Pathology at [facility] 11/08/12 Immunohistochemical Findings An immunohistochemical stain for calcitonin obtained on paraffin block A1 is positive.   Diagnosis A. "RIGHT LEVEL 6 LYMPH NODES" (BIOPSY):  METASTATIC MEDULLARY THYROID CARCINOMA IN TWO LYMPH NODES (2/2). SIZE OF LARGEST METASTASIS:2 CM. EXTRANODAL INVASION:PRESENT. Comment:  I certify that I personally conducted the diagnostic evaluation of the above  specimen(s) and have rendered the above diagnosis(es). Rex C. Telford Nab, M.D. Telephone:[phone] Electronically signed: 11/16/12   A. "Right level 6 lymph nodes", received fresh and placed in formalin on 1/9/ two fragments of tan-red soft tissue.Fragment 1 is 1.3 x 1 x 0.6 cm and is bisected and submitted in block A1.Fragment 2 is 2 x 1 x 0.4 cm and is submitted entirely in block A2.    RADIOGRAPHIC STUDIES: I have personally reviewed the radiological images as listed and agreed with the findings in the report. No results found.   CT Chest w Contrast w 3D MIPS Protocol at [facility] 09/29/17 Impression: 1.Mildly enlarged  mediastinal lymph nodes are stable when compared to most recent CT. Although, several nodes are minimally increased from 03/08/2016.  2.Refer to separately dictated abdominal CT report for findings below the diaphragm.  Bone Scan Whole Body at [facility] 09/27/17 Impression: 1. Stable foci of increased radiotracer activity in the right frontal calvarium, indeterminate. 2. No new scintigraphic evidence of osseous metastasis.  US Lymph Node Neck Mapping at [facility] 08/29/17 Impression: 1.  Newly measured right level 5 lymph node (lymph node #5) with abnormal rounded morphology, no echogenic hilum, and microcalcifications. Although biopsy of this nodule may be technically difficult due to its location deep to the carotid vasculature, ultrasound-guided biopsy is recommended (if possible).  2.  Newly measured right level 2A lymph node without suspicious features.  3.  Additional right neck nodes are unchanged.   Bone Density Scan 02/27/17 @ [facility]  Lowest Site Measured: Total Left hip with T-score of -3.1 and BMD of 0.568   CT CAP W Contrast W MIPS at [facility] 02/13/17 Impression: 1. Please see CT neck completed same day for findings in the neck. 2. Stable nonenlarged mediastinal lymph nodes and soft tissue nodule in the right thyroidectomy bed. 3. Unchanged irregular opacities in the lung apices bilaterally, which may represent treated metastases or scarring. No new metastatic disease in the chest, abdomen, or pelvis.  CT Nect Soft Tissue W Contrast at [facility] 02/13/17 IMPRESSION: Numerous soft tissue enhancing lesions in the superior mediastinum. While these  are stable when compared to most recent CT, several of these lesions demonstrate minimal growth from the more remote exam on 09/07/2015. No new enhancing lesions are present.  ASSESSMENT & PLAN:   BRINLYNN GORTON is a 77 y.o. caucasian female with    1. Medullary thyroid cancer in the setting of MEN2A Genetic Defect, metastatic to lungs S/p  total thyroidectomy in 10/2009 and bilateral neck dissection in 03/2010 Has had persistent hypercalcitoninemia postoperatively.  She had a level 6 LN dissection on 11/08/12 with 2/2 LN positive, largest 2cm for MTC.  09/05/14 CAP CT with development of new calcified nodules in the left lower lobe 09/23/14 Lung biopsy showed Level 9 lymph node, positive for malignancy and metastatic cancer which was resected. Treatment for symptomatic disease with vandetanib and cabozantibnib were previously discussed with her 58 Oncologist but the patient had decided to hold off on treatment due to concerns for toxicities and lack of overt cancer related progressive symptoms. Latest scans from 08/2017 are stable, no disease found below chest or in the bones. Her calcitonin levels have been increasing since 2015. Overall pt currently asymptomatic.    2. 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 -Secondary to hypercalcitoninemia  -Currently on imodium daily, cholestyramine twice daily and started monthly Sandostatin injections on 10/20/17.  -Her last colonoscopy was in 2010  PLAN:  -I discussed treatment options, such as vandetanib and cabozantibnib, for symptomatic disease.  -she would liketo hold off on systemic treatments unless there is obvious progressive disease that would be a reason to consider treatment, we will continue close observation.   -Given her only side effect is worsening diarrhea I will continue her on monthly Sandostatin injections. I discussed the side effect in great detail. She is agreeable.  -Will continue imodium and cholestyramine for now. Will reduce cholestyramine if Sandostatin relieves her diarrhea.  -Will prescribe Lomotil to take only as needed.  -I suggest she watch her dairy and gluten intake.   -Will follow up with endocrinologist to manage her thyroid replacement and will continue to follow Dr. Leamon Arnt for her primary oncology care and scheduled imaging  studies q40month. -Will do labs for baseline blood counts  - we will continue sandostatin [id number] -lomotil BID prn for uncontrolled diet.  3. Osteoporosis - Last DEXA 02/27/17 with Lowest Site Measured: Total Left hip with T-score of -3.1 and BMD of 0.568 Closed left hip fracture from fall -s/p surgery on 10/23/17 with rod placement - healing well.  -She takes Reclast injections for her osteoporosis. Managed by her PCP  -Currently on oral iron and aspirin 32531maily  -She is currently on hydrocodone q6hours  -Will follow up with Surgeon soon    Sandostatin q4weeks - to start ASAP. plz schedule 4 doses out. Labs today RTC with Dr KalIrene Limbo 2 months with 3rd dose of Sandostatin with labs    All of the patients questions were answered with apparent satisfaction. The patient knows to call the clinic with any problems, questions or concerns.  I spent 505 minutes counseling the patient face to face. The total time spent in the appointment was 60 minutes and more than 50% was on counseling and direct patient cares.    GauSullivan Lone MS Oak ValleyHIVMS SCHKindred Hospital South BayHBaylor Scott & White Medical Center - Pflugervillematology/Oncology Physician ConPgc Endoscopy Center For Excellence LLCOffice):       [phone] cell):  [phone]):           [phone]/09/2018 11:48 AM  This document serves as a record of services personally performed by Sullivan Lone, MD. It was created on his behalf by Joslyn Devon, a trained medical scribe. The creation of this record is based on the scribe's personal observations and the provider's statements to them.    .I have reviewed the above documentation for accuracy and completeness, and I agree with the above. Brunetta Genera MD MS

## 2017-12-12 ENCOUNTER — Inpatient Hospital Stay: Payer: Medicare Other | Attending: Hematology | Admitting: Hematology

## 2017-12-12 ENCOUNTER — Inpatient Hospital Stay: Payer: Medicare Other

## 2017-12-12 ENCOUNTER — Telehealth: Payer: Self-pay | Admitting: Hematology

## 2017-12-12 ENCOUNTER — Other Ambulatory Visit: Payer: Self-pay | Admitting: Hematology

## 2017-12-12 VITALS — BP 128/72 | HR 91 | Temp 98.0°F | Resp 18 | Ht 63.0 in | Wt 102.2 lb

## 2017-12-12 DIAGNOSIS — R197 Diarrhea, unspecified: Secondary | ICD-10-CM | POA: Diagnosis not present

## 2017-12-12 DIAGNOSIS — C73 Malignant neoplasm of thyroid gland: Secondary | ICD-10-CM

## 2017-12-12 DIAGNOSIS — M810 Age-related osteoporosis without current pathological fracture: Secondary | ICD-10-CM | POA: Insufficient documentation

## 2017-12-12 LAB — CBC WITH DIFFERENTIAL (CANCER CENTER ONLY)
BASOS ABS: 0.1 10*3/uL (ref 0.0–0.1)
Basophils Relative: 1 %
EOS ABS: 0.1 10*3/uL (ref 0.0–0.5)
EOS PCT: 3 %
HCT: 40.4 % (ref 34.8–46.6)
Hemoglobin: 13.2 g/dL (ref 11.6–15.9)
LYMPHS PCT: 19 %
Lymphs Abs: 1 10*3/uL (ref 0.9–3.3)
MCH: 29.7 pg (ref 25.1–34.0)
MCHC: 32.7 g/dL (ref 31.5–36.0)
MCV: 91 fL (ref 79.5–101.0)
Monocytes Absolute: 0.5 10*3/uL (ref 0.1–0.9)
Monocytes Relative: 9 %
Neutro Abs: 3.4 10*3/uL (ref 1.5–6.5)
Neutrophils Relative %: 68 %
PLATELETS: 186 10*3/uL (ref 145–400)
RBC: 4.44 MIL/uL (ref 3.70–5.45)
RDW: 13.6 % (ref 11.2–14.5)
WBC: 5.1 10*3/uL (ref 3.9–10.3)

## 2017-12-12 LAB — CMP (CANCER CENTER ONLY)
ALT: 10 U/L (ref 0–55)
AST: 19 U/L (ref 5–34)
Albumin: 4.2 g/dL (ref 3.5–5.0)
Alkaline Phosphatase: 101 U/L (ref 40–150)
Anion gap: 9 (ref 3–11)
BILIRUBIN TOTAL: 0.5 mg/dL (ref 0.2–1.2)
BUN: 16 mg/dL (ref 7–26)
CO2: 28 mmol/L (ref 22–29)
CREATININE: 0.83 mg/dL (ref 0.60–1.10)
Calcium: 9.3 mg/dL (ref 8.4–10.4)
Chloride: 107 mmol/L (ref 98–109)
GFR, Est AFR Am: >60 mL/min (ref >60)
Glucose, Bld: 89 mg/dL (ref 70–140)
POTASSIUM: 4.8 mmol/L (ref 3.5–5.1)
Sodium: 144 mmol/L (ref 136–145)
TOTAL PROTEIN: 7 g/dL (ref 6.4–8.3)

## 2017-12-12 LAB — RETICULOCYTES
RBC.: 4.44 MIL/uL (ref 3.70–5.45)
RETIC COUNT ABSOLUTE: 31.1 10*3/uL — AB (ref 33.7–90.7)
RETIC CT PCT: 0.7 % (ref 0.7–2.1)

## 2017-12-12 LAB — MAGNESIUM: Magnesium: 2.3 mg/dL (ref 1.5–2.5)

## 2017-12-12 MED ORDER — DIPHENOXYLATE-ATROPINE 2.5-0.025 MG PO TABS: 1.0000 | ORAL_TABLET | Freq: Two times a day (BID) | ORAL | 0 refills | Status: DC | PRN

## 2017-12-12 NOTE — Telephone Encounter (Signed)
Gave patient avs and calendar with appts per 2/12 los.

## 2017-12-22 ENCOUNTER — Inpatient Hospital Stay: Payer: Medicare Other

## 2017-12-22 ENCOUNTER — Other Ambulatory Visit: Payer: Medicare Other

## 2017-12-22 VITALS — BP 130/65 | HR 95 | Temp 97.9°F | Resp 20

## 2017-12-22 DIAGNOSIS — C73 Malignant neoplasm of thyroid gland: Secondary | ICD-10-CM | POA: Diagnosis not present

## 2017-12-22 DIAGNOSIS — E070 Hypersecretion of calcitonin: Secondary | ICD-10-CM

## 2017-12-22 DIAGNOSIS — R197 Diarrhea, unspecified: Secondary | ICD-10-CM

## 2017-12-22 MED ORDER — OCTREOTIDE ACETATE 30 MG IM KIT
30.0000 mg | PACK | Freq: Once | INTRAMUSCULAR | Status: AC
  Administered 2017-12-22: 30 mg via INTRAMUSCULAR

## 2017-12-22 NOTE — Patient Instructions (Signed)

## 2018-01-18 ENCOUNTER — Other Ambulatory Visit: Payer: Self-pay | Admitting: *Deleted

## 2018-01-18 DIAGNOSIS — C73 Malignant neoplasm of thyroid gland: Secondary | ICD-10-CM

## 2018-01-18 NOTE — Progress Notes (Signed)
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 total thyroidectomy in January of 2011 and bilateral neck dissection in June of 2011.  B. Persistent hypercalcitoninemia postoperatively. Calcitonin 499 in 07/2011  C. December 2012: CT of neck/chest was negative for metastases D. She had a normal neck ultrasound in 01/2012 showing her cystic LN was smaller (the one biopsied and negative).  E. FDG PET 08/2012 due to her last calcitonin raising substantially to 739 from the 500s: concerning right cervical lymph node F. 11/08/2012: Level 6 LN dissection. She had 2/2 LN positive, largest 2cm for MTC. Calcitonin one week later from her surgery was down to 515.  G. Calcitonin 592 on 05/24/2013. Repeat ultrasound on that same day which showed 2 at least partially cystic lymph nodes at level 4 which had been previously biopsied and negative; decreased in size since 2012. It also showed a subcentimeter right level VB LN of low suspicion. H. Her metanephrines (plasma) and PTH were normal in 12/2013 Urine studies for pheo were negative in 12/2013. I. 08/04/2014: CEA 9.0; calcitonin 1019 J. 09/05/2014:  - C/A/P CT scan: Interval development of a new calcified nodule in the left lower lobe. - Bone scan: Slight asymmetric increased radiotracer uptake in the right lateral calvarium on the frontal radiograph - Neck U/S: Stable right lateral compartment level 4 and 5B lymph nodes. The cystic right level 4 lymph node, previously biopsied on 01/14/2011 with negative pathology results.  K. 09/23/2014: Thoracoscopy: level 9 LN: Metastatic medullary thyroid carcinoma.  L. 03/11/2015:  CEA 15.9; calcitonin 1149 M 04-13-2015:  - C/A/P CT scan: Previously reported left lower lobe pulmonary nodule is no longer identified. No new or enlarging pulmonary nodules. No evidence of metastatic disease in the abdomen or pelvis. - CT brain:No acute intracranial abnormalities.  New small CSF density subdural fluid collection overlying the right cerebral hemisphere, likely a small hygroma of uncertain etiology. There is no associated bony or parenchymal lesion. 04-13-2015: Bone scan: A non-specific, very small focus of increased tracer activity in the right frontal calvarium is not significantly changed from the prior study. - Neck U/S: There is hypoechoic lesion with questionable microcalcification located in right level IV, it is 0.4 x 0.6 x 0.9 cm. No other pathologic cervical lymphadenopathy seen.  N. 04/13/2015: CEA 14.6; calcitonin 1324 O. 07/16/2015 ([facility]): CEA 19.4 (outsode of [facility]); CAlcitonin 1860. P. 09-07-2015: Neck, Chest , Abd, [place] CT: Stable post-thyroidectomy changes without evidence of recurrent or metastatic disease. Stable pulmonary nodules. Hepatic cysts. No bone mets. CEA: 16; Calcitonin 1533 Q. 03-08-2016: C/A/P CT scan: No definite evidence of metastatic disease in the chest, abdomen or pelvis. Compared to 09/07/2015, there is a new linear 3 mm right lower lobe pulmonary nodule which is indeterminate and may represent focal atelectasis CEA 16.5; CAlcitonin 1839  R. 08-22-2016: Calcitonin: 4596; CEA 27.1  S. 09-06-2016:  - Bone scan: Redemonstrated likely two foci of increased radiotracer uptake within the right calvarium, similar to prior study. - Neck CT: No evidence of recurrent or metastatic disease in the neck. - C/A/P CT: No definite evidence of metastatic disease in the chest, abdomen, or pelvis. - Neck CT: Status post thyroidectomy  and lymph node dissection without evidence of recurrent or metastatic disease.  T. Bone scan: A focus of increased tracer activity  projecting over the posterior right approximate 8th rib is non-specific; this finding may relate to summation of tracer activity in the rib with the inferior tip of the overlying right scapula, post-traumatic change or skeletal metastasis; recommend attention on follow-up. Re-demonstration of subtly increased tracer activity in the right frontal calvarium, stable since 09/05/2014. U. 02-13-2017:  - C/A/P CT scan: nonenlarged mediastinal lymph nodes and soft tissue nodule in the right thyroidectomy bed. Unchanged irregular opacities in the lung apices bilaterally, which may represent treated metastases or scarring.  - Neck CT: Numerous soft tissue enhancing lesions in the superior mediastinum. While these are stable when compared to most recent CT, several of these lesions demonstrate minimal growth from the more remote exam on 09/07/2015. No new enhancing lesions are present. - CEA 25.1, Calcitonin: 5498 V. 08-28-2017: fractionate plasma free metanephrines: Normetanephrine 178 (24hr urine pending as this was thought to be non-specific), Metanephrine: 44 (normal). CEA 46; Calcitonin 9763 W. 09-27-2017: Bone scan: Stable increased radiotracer activity in the right frontal calvarium, indeterminate. No other evidence of osseous metastasis Chest CT/Abd: Mildly enlarged mediastinal lymph nodes are stable. No evidence of metastatic disease in the abdomen   HISTORY OF PRESENTING ILLNESS:   Shannon [name] is a wonderful 77 y.o. female who has been referred to [place] by [place] oncologist Dr. Leslie Andrea for management of Metastatic Medullary Thyroid Cancer. She is looking for a Financial controller in [place].  Today she is accompanied by her sister. Patient notes this started with a nodule in her throat and her endocrinologist started further work up. She has been seeing Med onc at [facility] since her first surgery every 3-6 months.   She has had thyroid surgery and Ldissection but no systemic therapy as  detailed above in the oncologic history.  She notes her family had testing for MEN2A mutation. Her sister, her nephew carry the gene. She notes her maternal aunt had goiters but not sure of the cause.   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 She has had diarrhea since 2010. Her last colonoscopy was in 2010. She has tried multiple treatment options but this continues to worsen over time. She currently takes imodium 4-6 X daily, cholestyramine twice daily. She started monthly Sandostatin on 10/20/17. She fell and broke her hip on 10/23/17 and had surgery same day. She was given hydrocodone which helped her diarrhea and is not sure if the Sandostatin is working for her. She has been on oral iron and she is on aspirin 335m since her hip surgery. She does PT exercises and has been healing well.    She recently stopped caffeine tablets that she was taking for energy. She stopped 3 months ago. She is on Reclast for her osteoporosis, which is managed by her PCP, Dr. KDeatra Ina Pt notes she stopped smoking about 40 years ago and she rarely drinks alcohol.   On review of symptoms, pt notes continued diarrhea, stable energy, weight loss of 6-8 pounds in the last 6 months. Her appetite is adequate. Her diarrhea occurs currently twice a day with "pudding" form to liquid form stool. She can go up to 5-7 times a day at times. She denies cramping with diarrhea. She denies blood, and mucus in stool. Current use of oral iron causes some black stool, which was not present before use.   INTERVAL HISTORY:  Shannon [name] returns today regarding her Metastatic Medullary Thyroid Cancer. The patient's last visit with [place] was on 12/12/17. She is accompanied today by her sister. The pt reports that she is doing well overall.   The pt reports that she has begun Sandostatin, and takes 6 Imodium each day. She notes that she was only given 10 pills of the Lomotil which did alleviate some of her  diarrhea. She has stopped taking her cholestyramine because she could not notice any difference while she was taking it. She notes that her soft diarrhea persists in average volume, 3-4 times each day. She notes being told that her insurance limited her Lomotil to only 10 pills. She notes that when previously upon Hydrocodone, her diarrhea was completely relieved. She notes that the frequency and amount of diarrhea is not decreasing the quality of her life at this time.   She has lost 3 lbs in the last 5 weeks and notes that she doesn't eat as much as she used to but eats whatever she wants, noting no limitations as such.   She will see Dr. Leamon Arnt on April 15 and will have imaging studies repeated then. She will also see her endocrinologist in April at [facility].   She notes that her hip has been healing well after surgery.   Lab results today (01/19/18) of CBC, CMP, and Magnesium is as follows: all values are WNL.  On review of systems, pt reports persisting diarrhea, some weight loss, and denies CP, blood in the stools, leg swelling and any other symptoms.   MEDICAL HISTORY:  Past Medical History:  Diagnosis Date  . Anxiety   . Breast cancer ([place]) 1999   left lumpectomy/radiation/ductal carcinoma in situ  . Cancer ([place])   . Colon polyp   . Contact lens/glasses fitting    HAS LENS IMPLANTS  . Lung cancer ([place]) 2015   LYMPHNODE REMOVAL   . Medullary carcinoma of thyroid ([place]) 2011  . Osteoporosis     SURGICAL HISTORY: Past Surgical History:  Procedure Laterality Date  . ABDOMINAL SURGERY  1989   RUPTURED APPENDIX  . BREAST SURGERY  1999   LEFT BREAST LUMPECTOMY. DUCTAL CARCINOMA IN SITU./ RADIATION  . CATARACT EXTRACTION W/ INTRAOCULAR LENS IMPLANT  2009   BOTH EYES  . FEMUR IM NAIL Left 10/23/2017   Procedure: INTRAMEDULLARY (IM) NAIL FEMORAL;  Surgeon: Paralee Cancel, MD;  Location: [facility];  Service: Orthopedics;  Laterality: Left;  . FOOT SURGERY  1070   RIGHT FOOT - PINCHED NERVE    . LUNG SURGERY     CALCIFICATIONS, POSITIVE - CANCER LYMPHNODE   . MELANOMA EXCISION  2008   CHEST AREA  . NECK SURGERY  Nov 09 2011   LYMPHNODES REMOVED 2 POSITIVE   . RIGHT COLECTOMY  06/02/2009  . THYROIDECTOMY  11/06/2009  . TONSILLECTOMY AND ADENOIDECTOMY      SOCIAL HISTORY: Social History   Socioeconomic History  . Marital status: Married    Spouse name: Not on file  . Number of children: Not on file  . Years of education: Not on file  . Highest education level: Not on file  Occupational History  . Not on file  Social Needs  . Financial resource strain: Not on file  . Food insecurity:    Worry: Not on file    Inability: Not on file  . Transportation needs:    Medical: Not on file    Non-medical: Not on file  Tobacco Use  . Smoking  status: Former Smoker    Last attempt to quit: 08/10/1960    Years since quitting: 57.4  . Smokeless tobacco: Never Used  Substance and Sexual Activity  . Alcohol use: No    Alcohol/week: 0.0 oz  . Drug use: No  . Sexual activity: Never  Lifestyle  . Physical activity:    Days per week: Not on file    Minutes per session: Not on file  . Stress: Not on file  Relationships  . Social connections:    Talks on phone: Not on file    Gets together: Not on file    Attends religious service: Not on file    Active member of club or organization: Not on file    Attends meetings of clubs or organizations: Not on file    Relationship status: Not on file  . Intimate partner violence:    Fear of current or ex partner: Not on file    Emotionally abused: Not on file    Physically abused: Not on file    Forced sexual activity: Not on file  Other Topics Concern  . Not on file  Social History Narrative  . Not on file    FAMILY HISTORY: Family History  Problem Relation Age of Onset  . Cancer Father        throat/ bone  . Breast cancer Sister   . Cancer Sister        MEDULAR CANCER  . Cancer Brother        prostate  . Cancer Other         MEDULAR CANCER  . Breast cancer Maternal Aunt   . Cancer Paternal Aunt        pancreatic   . Cancer Cousin        ON MOTHER'S SIDE- LIVER CANCER    ALLERGIES:  is allergic to codeine.  MEDICATIONS:  Current Outpatient Medications  Medication Sig Dispense Refill  . ALPRAZolam (XANAX) 1 MG tablet Take 0.5 tablets (0.5 mg total) by mouth 2 (two) times daily. 6 tablet 0  . Biotin 1000 MCG tablet Take 1,000 mcg by mouth daily.     . Calcium Carbonate-Vit D-Min (CALCIUM 600 + MINERALS PO) Take 1 tablet by mouth daily.     . cetirizine (ZYRTEC) 10 MG tablet Take 10 mg by mouth daily.    . Cholecalciferol (VITAMIN D) 1000 UNITS capsule Take 1,000 Units by mouth daily.      Marland Kitchen Cod Liver Oil 1000 MG CAPS Take 1,000 mg by mouth daily.     . Diphenhydramine-APAP, sleep, (TYLENOL PM EXTRA STRENGTH PO) Take 1 tablet by mouth at bedtime.     . diphenoxylate-atropine (LOMOTIL) 2.5-0.025 MG tablet Take 1 tablet by mouth 2 (two) times daily as needed for diarrhea or loose stools. 30 tablet 0  . ferrous sulfate 325 (65 FE) MG tablet Take 1 tablet (325 mg total) by mouth 2 (two) times daily with a meal. 60 tablet 3  . Glucosamine-Chondroitin 250-200 MG CAPS Take 1 tablet by mouth daily.    Marland Kitchen HYDROcodone-acetaminophen (NORCO/VICODIN) 5-325 MG tablet Take 1-2 tablets by mouth every 6 (six) hours as needed for moderate pain. 40 tablet 0  . levothyroxine (SYNTHROID, LEVOTHROID) 112 MCG tablet Take 112 mcg by mouth daily. /125MCG    . loperamide (IMODIUM A-D) 2 MG tablet Take 2 mg by mouth 3 (three) times daily.    . methocarbamol (ROBAXIN) 500 MG tablet Take 1 tablet (500 mg total) by mouth every  6 (six) hours as needed for muscle spasms. 30 tablet 0  . Multiple Vitamin (MULTIVITAMIN) tablet Take 1 tablet by mouth daily.      Marland Kitchen PRESCRIPTION MEDICATION Inject 1 application into the skin every 30 (thirty) days. To control diarrhea    . simethicone (MYLICON) 80 MG chewable tablet Chew 80 mg by mouth 3  (three) times daily.     . zoledronic acid (RECLAST) 5 MG/100ML SOLN Inject 5 mg into the vein once.       No current facility-administered medications for this visit.     REVIEW OF SYSTEMS:    .10 Point review of Systems was done is negative except as noted above.   PHYSICAL EXAMINATION: ECOG PERFORMANCE STATUS: 1 - Symptomatic but completely ambulatory   Vitals:   01/19/18 1041  BP: 120/66  Pulse: 89  Resp: 18  Temp: 97.7 F (36.5 C)  TempSrc: Oral  SpO2: 99%  Weight: 99 lb 3.2 oz (45 kg)  Height: '5\' 3"'$  (1.6 m)    . GENERAL:alert, in no acute distress and comfortable SKIN: no acute rashes, no significant lesions EYES: conjunctiva are pink and non-injected, sclera anicteric OROPHARYNX: MMM, no exudates, no oropharyngeal erythema or ulceration NECK: supple, no JVD LYMPH:  no palpable lymphadenopathy in the cervical, axillary or inguinal regions LUNGS: clear to auscultation b/l with normal respiratory effort HEART: regular rate & rhythm ABDOMEN:  normoactive bowel sounds , non tender, not distended. Extremity: no pedal edema PSYCH: alert & oriented x 3 with fluent speech NEURO: no focal motor/sensory deficits    LABORATORY DATA:  I have reviewed the data as listed  . CBC Latest Ref Rng & Units 01/19/2018 12/12/2017 10/24/2017  WBC 3.9 - 10.3 K/uL 5.3 5.1 9.0  Hemoglobin 12.0 - 15.0 g/dL - - 12.3  Hematocrit 34.8 - 46.6 % 40.6 40.4 36.0  Platelets 145 - 400 K/uL 190 186 199  hgb 13.5  . CMP Latest Ref Rng & Units 01/19/2018 12/12/2017 10/25/2017  Glucose 70 - 140 mg/dL 96 89 111(H)  BUN 7 - 26 mg/dL '18 16 7  '$ Creatinine 0.60 - 1.10 mg/dL 0.78 0.83 0.67  Sodium 136 - 145 mmol/L 142 144 141  Potassium 3.5 - 5.1 mmol/L 4.3 4.8 3.7  Chloride 98 - 109 mmol/L 104 107 105  CO2 22 - 29 mmol/L '29 28 28  '$ Calcium 8.4 - 10.4 mg/dL 10.0 9.3 8.2(L)  Total Protein 6.4 - 8.3 g/dL 6.9 7.0 -  Total Bilirubin 0.2 - 1.2 mg/dL 0.6 0.5 -  Alkaline Phos 40 - 150 U/L 89 101 -  AST  5 - 34 U/L 19 19 -  ALT 0 - 55 U/L 14 10 -   PATHOLOGY  Lung Biopsy at [facility] 09/23/14 DIAGNOSIS A. Pleural nodule, biopsy:  Calcified and hyalinized remote fat necrosis. No evidence of malignancy.   B. Level 9 lymph node, biopsy:  Metastatic medullary thyroid carcinoma. See comment.  Comment:: A note is made of the patient's history of medullary thyroid carcinoma with documented prior metastatic disease to cervical lymph nodes ([id number]; 04/05/2010).  The prior metastases to cervical lymph nodes are reviewed in conjunction to the current case and the tumors are morphologically similar.   Confirmatory immunohistochemical stains are performed. Tumor cells stain positively for calcitonin and for an anticytokeratin cocktail. The morphologic and immunophenotypic findings support metastatic medullary thyroid carcinoma.   C. Level 8 lymph node, biopsy:  Profiles of lymph nodes, negative for malignancy.   D. Pleural nodule #2, biopsy:  Fibrovascular tissue with extensive cauterization. No evidence of malignancy.        Surgical Pathology at [facility] 11/08/12 Immunohistochemical Findings An immunohistochemical stain for calcitonin obtained on paraffin block A1 is positive.   Diagnosis A. "RIGHT LEVEL 6 LYMPH NODES" (BIOPSY):  METASTATIC MEDULLARY THYROID CARCINOMA IN TWO LYMPH NODES (2/2). SIZE OF LARGEST METASTASIS:2 CM. EXTRANODAL INVASION:PRESENT. Comment:  I certify that I personally conducted the diagnostic evaluation of the above  specimen(s) and have rendered the above diagnosis(es). Rex C. Telford Nab, M.D. Telephone:[phone] Electronically signed: 11/16/12   A. "Right level 6 lymph nodes", received fresh and placed in formalin on 1/9/ two fragments of tan-red soft tissue.Fragment 1 is 1.3 x 1 x 0.6 cm and is bisected and submitted in block A1.Fragment 2 is 2 x 1 x 0.4 cm and is submitted  entirely in block A2.    RADIOGRAPHIC STUDIES: I have personally reviewed the radiological images as listed and agreed with the findings in the report. No results found.   CT Chest w Contrast w 3D MIPS Protocol at [facility] 09/29/17 Impression: 1.Mildly enlarged mediastinal lymph nodes are stable when compared to most recent CT. Although, several nodes are minimally increased from 03/08/2016.  2.Refer to separately dictated abdominal CT report for findings below the diaphragm.  Bone Scan Whole Body at [facility] 09/27/17 Impression: 1. Stable foci of increased radiotracer activity in the right frontal calvarium, indeterminate. 2. No new scintigraphic evidence of osseous metastasis.  US Lymph Node Neck Mapping at [facility] 08/29/17 Impression: 1.  Newly measured right level 5 lymph node (lymph node #5) with abnormal rounded morphology, no echogenic hilum, and microcalcifications. Although biopsy of this nodule may be technically difficult due to its location deep to the carotid vasculature, ultrasound-guided biopsy is recommended (if possible).  2.  Newly measured right level 2A lymph node without suspicious features.  3.  Additional right neck nodes are unchanged.   Bone Density Scan 02/27/17 @ [facility]  Lowest Site Measured: Total Left hip with T-score of -3.1 and BMD of 0.568   CT CAP W Contrast W MIPS at [facility] 02/13/17 Impression: 1. Please see CT neck completed same day for findings in the neck. 2. Stable nonenlarged mediastinal lymph nodes and soft tissue nodule in the right thyroidectomy bed. 3. Unchanged irregular opacities in the lung apices bilaterally, which may represent treated metastases or scarring. No new metastatic disease in the chest, abdomen, or pelvis.  CT Nect Soft Tissue W Contrast at [facility] 02/13/17 IMPRESSION: Numerous soft tissue enhancing lesions in the superior mediastinum. While these are stable when compared to most recent CT, several of these  lesions demonstrate minimal growth from the more remote exam on 09/07/2015. No new enhancing lesions are present.  ASSESSMENT & PLAN:   SYBRINA LANING is a 77 y.o. caucasian female with    1. 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  She had a level 6 LN dissection on 11/08/12 with 2/2 LN positive, largest 2cm for MTC.  09/05/14 CAP CT with development of new calcified nodules in the left lower lobe 09/23/14 Lung biopsy showed Level 9 lymph node, positive for malignancy and metastatic cancer which was resected. Treatment for symptomatic disease with vandetanib and cabozantibnib were previously discussed with her 53 Oncologist but the patient had decided to hold off on treatment due to concerns for toxicities and lack of overt cancer  related progressive symptoms. Latest scans from 08/2017 are stable, no disease found below chest or in the bones. Her calcitonin levels have been increasing since 2015. Overall pt currently asymptomatic.   2. 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 -Secondary to hypercalcitoninemia  -Currently on imodium daily, cholestyramine twice daily and started monthly Sandostatin injections on 10/20/17.  -Her last colonoscopy was in 2010  PLAN:  -we rediscussed treatment options, such as vandetanib and cabozantibnib, for symptomatic disease and continues to want to hold off on systemic treatments unless there is obvious progressive disease that would be a reason to consider treatment, we will continue close observation.   --Will continue imodium and cholestyramine for now. Will reduce cholestyramine if Sandostatin relieves her diarrhea. -I suggest she watch her dairy and gluten intake.   --Discussed pt labwork today; blood counts, chemistries and Magnesium are all stable and  WNL, including Calcium at 10.0.  -Discussed that to combat the diarrhea we could move her Sandostatin from every 4 weeks to every 3 weeks. -Will follow up with endocrinologist to manage her thyroid replacement and will continue to follow Dr. Leamon Arnt for her primary oncology care and scheduled imaging studies q64month. -gave prescription and coupon for lomotil - patient notes she would buy it if not completely covered by insurance. -We will see pt back in 3 months, and sooner if imaging studies with Dr. MLeamon Arntreveal anything overtly concerning.  3. Osteoporosis - Last DEXA 02/27/17 with Lowest Site Measured: Total Left hip with T-score of -3.1 and BMD of 0.568 Closed left hip fracture from fall -s/p surgery on 10/23/17 with rod placement - healing well.  -She takes Reclast injections for her osteoporosis. Managed by her PCP    Continue Sandostatin q3weeks (changed from q4weeks) RTC with Dr KIrene Limboin 12 weeks with labs   All of the patients questions were answered with apparent satisfaction. The patient knows to call the clinic with any problems, questions or concerns.  . The total time spent in the appointment was 20 minutes and more than 50% was on counseling and direct patient cares.      GSullivan LoneMD MMoscowAAHIVMS SCornerstone Ambulatory Surgery Center LLCCPristine Hospital Of PasadenaHematology/Oncology Physician CMidmichigan Medical Center[facility]Gr[facility] (Office):       [phone](Work cell):  [phone](Fax):           [phone] 01/19/2018 10:52 AM  This document serves as a record of services personally performed by GSullivan Lone MD. It was created on his behalf by SBaldwin Jamaica a trained medical scribe. The creation of this record is based on the scribe's personal observations and the provider's statements to them.   .I have reviewed the above documentation for accuracy and completeness, and I agree with the above. .Brunetta GeneraMD MS

## 2018-01-19 ENCOUNTER — Inpatient Hospital Stay (HOSPITAL_BASED_OUTPATIENT_CLINIC_OR_DEPARTMENT_OTHER): Payer: Medicare Other | Admitting: Hematology

## 2018-01-19 ENCOUNTER — Encounter: Payer: Self-pay | Admitting: Hematology

## 2018-01-19 ENCOUNTER — Inpatient Hospital Stay: Payer: Medicare Other

## 2018-01-19 ENCOUNTER — Inpatient Hospital Stay: Payer: Medicare Other | Attending: Hematology

## 2018-01-19 ENCOUNTER — Telehealth: Payer: Self-pay | Admitting: Hematology

## 2018-01-19 VITALS — BP 120/66 | HR 89 | Temp 97.7°F | Resp 18 | Ht 63.0 in | Wt 99.2 lb

## 2018-01-19 DIAGNOSIS — C7800 Secondary malignant neoplasm of unspecified lung: Secondary | ICD-10-CM | POA: Diagnosis not present

## 2018-01-19 DIAGNOSIS — R197 Diarrhea, unspecified: Secondary | ICD-10-CM | POA: Diagnosis not present

## 2018-01-19 DIAGNOSIS — E070 Hypersecretion of calcitonin: Secondary | ICD-10-CM

## 2018-01-19 DIAGNOSIS — C73 Malignant neoplasm of thyroid gland: Secondary | ICD-10-CM

## 2018-01-19 LAB — CMP (CANCER CENTER ONLY)
ALK PHOS: 89 U/L (ref 40–150)
ALT: 14 U/L (ref 0–55)
AST: 19 U/L (ref 5–34)
Albumin: 4.1 g/dL (ref 3.5–5.0)
Anion gap: 9 (ref 3–11)
BUN: 18 mg/dL (ref 7–26)
CALCIUM: 10 mg/dL (ref 8.4–10.4)
CO2: 29 mmol/L (ref 22–29)
CREATININE: 0.78 mg/dL (ref 0.60–1.10)
Chloride: 104 mmol/L (ref 98–109)
Glucose, Bld: 96 mg/dL (ref 70–140)
Potassium: 4.3 mmol/L (ref 3.5–5.1)
Sodium: 142 mmol/L (ref 136–145)
Total Bilirubin: 0.6 mg/dL (ref 0.2–1.2)
Total Protein: 6.9 g/dL (ref 6.4–8.3)

## 2018-01-19 LAB — CBC WITH DIFFERENTIAL (CANCER CENTER ONLY)
BASOS PCT: 2 %
Basophils Absolute: 0.1 10*3/uL (ref 0.0–0.1)
EOS ABS: 0.1 10*3/uL (ref 0.0–0.5)
Eosinophils Relative: 3 %
HCT: 40.6 % (ref 34.8–46.6)
HEMOGLOBIN: 13.5 g/dL (ref 11.6–15.9)
Lymphocytes Relative: 17 %
Lymphs Abs: 0.9 10*3/uL (ref 0.9–3.3)
MCH: 29.3 pg (ref 25.1–34.0)
MCHC: 33.3 g/dL (ref 31.5–36.0)
MCV: 88.3 fL (ref 79.5–101.0)
MONOS PCT: 14 %
Monocytes Absolute: 0.7 10*3/uL (ref 0.1–0.9)
NEUTROS PCT: 64 %
Neutro Abs: 3.5 10*3/uL (ref 1.5–6.5)
Platelet Count: 190 10*3/uL (ref 145–400)
RBC: 4.6 MIL/uL (ref 3.70–5.45)
RDW: 13.3 % (ref 11.2–14.5)
WBC: 5.3 10*3/uL (ref 3.9–10.3)

## 2018-01-19 LAB — MAGNESIUM: Magnesium: 2.1 mg/dL (ref 1.5–2.5)

## 2018-01-19 MED ORDER — DIPHENOXYLATE-ATROPINE 2.5-0.025 MG PO TABS: 1.0000 | ORAL_TABLET | Freq: Two times a day (BID) | ORAL | 0 refills | Status: DC | PRN

## 2018-01-19 MED ORDER — OCTREOTIDE ACETATE 30 MG IM KIT
PACK | INTRAMUSCULAR | Status: AC
  Filled 2018-01-19: qty 1

## 2018-01-19 MED ORDER — OCTREOTIDE ACETATE 30 MG IM KIT
30.0000 mg | PACK | Freq: Once | INTRAMUSCULAR | Status: AC
  Administered 2018-01-19: 30 mg via INTRAMUSCULAR

## 2018-01-19 NOTE — Telephone Encounter (Signed)
Appointments scheduled AVS Calendar printed per 3/22 los

## 2018-01-19 NOTE — Patient Instructions (Signed)
Thank you for choosing [facility] to provide your oncology and hematology care.  To afford each patient quality time with our providers, please arrive 30 minutes before your scheduled appointment time.  If you arrive late for your appointment, you may be asked to reschedule.  We strive to give you quality time with our providers, and arriving late affects you and other patients whose appointments are after yours.   If you are a no show for multiple scheduled visits, you may be dismissed from the clinic at the providers discretion.    Again, thank you for choosing [facility], our hope is that these requests will decrease the amount of time that you wait before being seen by our physicians.  ______________________________________________________________________  Should you have questions after your visit to the [facility], please contact our office at [phone] between the hours of 8:30 and 4:30 p.m.    Voicemails left after 4:30p.m will not be returned until the following business day.    For prescription refill requests, please have your pharmacy contact us directly.  Please also try to allow 48 hours for prescription requests.    Please contact the scheduling department for questions regarding scheduling.  For scheduling of procedures such as PET scans, CT scans, MRI, Ultrasound, etc please contact central scheduling at [phone].    Resources For Cancer Patients and Caregivers:   Oncolink.org:  A wonderful resource for patients and healthcare providers for information regarding your disease, ways to tract your treatment, what to expect, etc.     [employer]:  800-227-2345  Can help patients locate various types of support and financial assistance  Cancer Care: 1-800-813-HOPE (4673) Provides financial assistance, online support groups, medication/co-pay assistance.    [place] DSS:  336-641-3447 Where to apply for food  stamps, Medicaid, and utility assistance  Medicare Rights [facility]: 800-333-4114 Helps people with Medicare understand their rights and benefits, navigate the Medicare system, and secure the quality healthcare they deserve  [employer]: 336-333-6589 [place] [employer]'s shared-ride transportation service for eligible riders who have a disability that prevents them from riding the fixed route bus.    For additional information on assistance programs please contact our social worker:   Grier Hock/Abigail Elmore:  [phone]            

## 2018-01-19 NOTE — Patient Instructions (Signed)

## 2018-02-09 ENCOUNTER — Inpatient Hospital Stay: Payer: Medicare Other | Attending: Hematology

## 2018-02-09 ENCOUNTER — Inpatient Hospital Stay: Payer: Medicare Other

## 2018-02-09 VITALS — BP 125/63 | HR 84 | Temp 97.7°F | Resp 18

## 2018-02-09 DIAGNOSIS — C73 Malignant neoplasm of thyroid gland: Secondary | ICD-10-CM | POA: Diagnosis present

## 2018-02-09 DIAGNOSIS — K5289 Other specified noninfective gastroenteritis and colitis: Secondary | ICD-10-CM | POA: Insufficient documentation

## 2018-02-09 DIAGNOSIS — R197 Diarrhea, unspecified: Secondary | ICD-10-CM

## 2018-02-09 DIAGNOSIS — E070 Hypersecretion of calcitonin: Secondary | ICD-10-CM

## 2018-02-09 MED ORDER — OCTREOTIDE ACETATE 30 MG IM KIT
PACK | INTRAMUSCULAR | Status: AC
  Filled 2018-02-09: qty 1

## 2018-02-09 MED ORDER — OCTREOTIDE ACETATE 30 MG IM KIT
30.0000 mg | PACK | Freq: Once | INTRAMUSCULAR | Status: AC
  Administered 2018-02-09: 30 mg via INTRAMUSCULAR

## 2018-02-09 NOTE — Patient Instructions (Signed)

## 2018-02-15 ENCOUNTER — Telehealth: Payer: Self-pay | Admitting: Hematology

## 2018-02-15 ENCOUNTER — Telehealth: Payer: Self-pay | Admitting: *Deleted

## 2018-02-15 NOTE — Telephone Encounter (Signed)
Pt states she is seeing another gynecologist office they will take over her reclast

## 2018-02-15 NOTE — Telephone Encounter (Signed)
Patient called to cancel did not want to reschedule

## 2018-03-02 ENCOUNTER — Ambulatory Visit: Payer: Medicare Other

## 2018-03-02 ENCOUNTER — Other Ambulatory Visit: Payer: Medicare Other

## 2018-04-12 NOTE — Progress Notes (Signed)
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 total thyroidectomy in January of 2011 and bilateral neck dissection in June of 2011.  B. Persistent hypercalcitoninemia postoperatively. Calcitonin 499 in 07/2011  C. December 2012: CT of neck/chest was negative for metastases D. She had a normal neck ultrasound in 01/2012 showing her cystic LN was smaller (the one biopsied and negative).  E. FDG PET 08/2012 due to her last calcitonin raising substantially to 739 from the 500s: concerning right cervical lymph node F. 11/08/2012: Level 6 LN dissection. She had 2/2 LN positive, largest 2cm for MTC. Calcitonin one week later from her surgery was down to 515.  G. Calcitonin 592 on 05/24/2013. Repeat ultrasound on that same day which showed 2 at least partially cystic lymph nodes at level 4 which had been previously biopsied and negative; decreased in size since 2012. It also showed a subcentimeter right level VB LN of low suspicion. H. Her metanephrines (plasma) and PTH were normal in 12/2013 Urine studies for pheo were negative in 12/2013. I. 08/04/2014: CEA 9.0; calcitonin 1019 J. 09/05/2014:  - C/A/P CT scan: Interval development of a new calcified nodule in the left lower lobe. - Bone scan: Slight asymmetric increased radiotracer uptake in the right lateral calvarium on the frontal radiograph - Neck U/S: Stable right lateral compartment level 4 and 5B lymph nodes. The cystic right level 4 lymph node, previously biopsied on 01/14/2011 with negative pathology results.  K. 09/23/2014: Thoracoscopy: level 9 LN: Metastatic medullary thyroid carcinoma.  L. 03/11/2015:  CEA 15.9; calcitonin 1149 M 04-13-2015:  - C/A/P CT scan: Previously reported left lower lobe pulmonary nodule is no longer identified. No new or enlarging pulmonary nodules. No evidence of metastatic disease in the abdomen or pelvis. - CT brain:No acute intracranial abnormalities.  New small CSF density subdural fluid collection overlying the right cerebral hemisphere, likely a small hygroma of uncertain etiology. There is no associated bony or parenchymal lesion. 04-13-2015: Bone scan: A non-specific, very small focus of increased tracer activity in the right frontal calvarium is not significantly changed from the prior study. - Neck U/S: There is hypoechoic lesion with questionable microcalcification located in right level IV, it is 0.4 x 0.6 x 0.9 cm. No other pathologic cervical lymphadenopathy seen.  N. 04/13/2015: CEA 14.6; calcitonin 1324 O. 07/16/2015 ([facility]): CEA 19.4 (outsode of [facility]); CAlcitonin 1860. P. 09-07-2015: Neck, Chest , Abd, Perlvic CT: Stable post-thyroidectomy changes without evidence of recurrent or metastatic disease. Stable pulmonary nodules. Hepatic cysts. No bone mets. CEA: 16; Calcitonin 1533 Q. 03-08-2016: C/A/P CT scan: No definite evidence of metastatic disease in the chest, abdomen or pelvis. Compared to 09/07/2015, there is a new linear 3 mm right lower lobe pulmonary nodule which is indeterminate and may represent focal atelectasis CEA 16.5; CAlcitonin 1839  R. 08-22-2016: Calcitonin: 4596; CEA 27.1  S. 09-06-2016:  - Bone scan: Redemonstrated likely two foci of increased radiotracer uptake within the right calvarium, similar to prior study. - Neck CT: No evidence of recurrent or metastatic disease in the neck. - C/A/P CT: No definite evidence of metastatic disease in the chest, abdomen, or pelvis. - Neck CT: Status post thyroidectomy  and lymph node dissection without evidence of recurrent or metastatic disease.  T. Bone scan: A focus of increased tracer activity  projecting over the posterior right approximate 8th rib is non-specific; this finding may relate to summation of tracer activity in the rib with the inferior tip of the overlying right scapula, post-traumatic change or skeletal metastasis; recommend attention on follow-up. Re-demonstration of subtly increased tracer activity in the right frontal calvarium, stable since 09/05/2014. U. 02-13-2017:  - C/A/P CT scan: nonenlarged mediastinal lymph nodes and soft tissue nodule in the right thyroidectomy bed. Unchanged irregular opacities in the lung apices bilaterally, which may represent treated metastases or scarring.  - Neck CT: Numerous soft tissue enhancing lesions in the superior mediastinum. While these are stable when compared to most recent CT, several of these lesions demonstrate minimal growth from the more remote exam on 09/07/2015. No new enhancing lesions are present. - CEA 25.1, Calcitonin: 5498 V. 08-28-2017: fractionate plasma free metanephrines: Normetanephrine 178 (24hr urine pending as this was thought to be non-specific), Metanephrine: 44 (normal). CEA 46; Calcitonin 9763 W. 09-27-2017: Bone scan: Stable increased radiotracer activity in the right frontal calvarium, indeterminate. No other evidence of osseous metastasis Chest CT/Abd: Mildly enlarged mediastinal lymph nodes are stable. No evidence of metastatic disease in the abdomen   HISTORY OF PRESENTING ILLNESS:   Shannon [name] is a wonderful 77 y.o. female who has been referred to [place] by [place] oncologist Dr. Leslie Andrea for management of Metastatic Medullary Thyroid Cancer. She is looking for a Financial controller in [place].  Today she is accompanied by her sister. Patient notes this started with a nodule in her throat and her endocrinologist started further work up. She has been seeing Med onc at [facility] since her first surgery every 3-6 months.   She has had thyroid surgery and Ldissection but no systemic therapy as  detailed above in the oncologic history.  She notes her family had testing for MEN2A mutation. Her sister, her nephew carry the gene. She notes her maternal aunt had goiters but not sure of the cause.   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 She has had diarrhea since 2010. Her last colonoscopy was in 2010. She has tried multiple treatment options but this continues to worsen over time. She currently takes imodium 4-6 X daily, cholestyramine twice daily. She started monthly Sandostatin on 10/20/17. She fell and broke her hip on 10/23/17 and had surgery same day. She was given hydrocodone which helped her diarrhea and is not sure if the Sandostatin is working for her. She has been on oral iron and she is on aspirin 335m since her hip surgery. She does PT exercises and has been healing well.    She recently stopped caffeine tablets that she was taking for energy. She stopped 3 months ago. She is on Reclast for her osteoporosis, which is managed by her PCP, Dr. KDeatra Ina Pt notes she stopped smoking about 40 years ago and she rarely drinks alcohol.   On review of symptoms, pt notes continued diarrhea, stable energy, weight loss of 6-8 pounds in the last 6 months. Her appetite is adequate. Her diarrhea occurs currently twice a day with "pudding" form to liquid form stool. She can go up to 5-7 times a day at times. She denies cramping with diarrhea. She denies blood, and mucus in stool. Current use of oral iron causes some black stool, which was not present before use.   INTERVAL HISTORY:  Shannon [name] returns today regarding her Metastatic Medullary Thyroid Cancer. The patient's last visit with [place] was on 01/19/18. She is accompanied today by her sister. The pt reports that she is doing well overall.   The pt reports that she had imaging studies in April with Dr Leamon Arnt at [facility], who she sees every 6 months. She has been taking 1 tbsp of bentonite clay daily since April.  She has also been consuming protein shakes which returned the color of her stools to normal from yellow. She has continued to have loose stools 6-8 times each day and denies that this is impacting her quality of life nor any pain. She keeps a food and symptom diary and has not found an association between her symptoms and diet. She will let me know if she decides to take a referral to our nutritional therapist.  She stopped taking Sandostatin shots in March or April. She takes 6-8 Imodium each day. She stopped taking Lomotil after four months, noting that it didn't help. She continues on cholestyramine.   Lab results today (04/13/18) of CBC, CMP is as follows: all values are WNL except for Lymphs abs at 0.8k. Magnesium 04/13/18 is WNL at 2.0 Calcitonin from 02/12/18 at [place] was slightly increased to 9815.   On review of systems, pt reports good energy levels, frequent bowel movements, and denies abdominal pains, leg swelling and any other symptoms.   MEDICAL HISTORY:  Past Medical History:  Diagnosis Date  . Anxiety   . Breast cancer ([place]) 1999   left lumpectomy/radiation/ductal carcinoma in situ  . Cancer ([place])   . Colon polyp   . Contact lens/glasses fitting    HAS LENS IMPLANTS  . Lung cancer ([place]) 2015   LYMPHNODE REMOVAL   . Medullary carcinoma of thyroid ([place]) 2011  . Osteoporosis     SURGICAL HISTORY: Past Surgical History:  Procedure Laterality Date  . ABDOMINAL SURGERY  1989   RUPTURED APPENDIX  . BREAST SURGERY  1999   LEFT BREAST LUMPECTOMY. DUCTAL CARCINOMA IN SITU./ RADIATION  . CATARACT EXTRACTION W/ INTRAOCULAR LENS IMPLANT  2009   BOTH EYES  . FEMUR IM NAIL Left 10/23/2017   Procedure: INTRAMEDULLARY (IM) NAIL FEMORAL;  Surgeon: Paralee Cancel, MD;  Location: [place];  Service: Orthopedics;  Laterality: Left;  . FOOT SURGERY  1070   RIGHT FOOT - PINCHED NERVE  . LUNG SURGERY     CALCIFICATIONS, POSITIVE - CANCER LYMPHNODE   . MELANOMA EXCISION  2008   CHEST AREA    . NECK SURGERY  Nov 09 2011   LYMPHNODES REMOVED 2 POSITIVE   . RIGHT COLECTOMY  06/02/2009  . THYROIDECTOMY  11/06/2009  . TONSILLECTOMY AND ADENOIDECTOMY      SOCIAL HISTORY: Social History   Socioeconomic History  . Marital status: Married    Spouse name: Not on file  . Number of children: Not on file  . Years of education: Not on file  . Highest education level: Not on file  Occupational History  . Not on file  Social Needs  . Financial resource strain: Not on file  . Food insecurity:    Worry: Not on file    Inability: Not on file  . Transportation needs:    Medical: Not on file    Non-medical: Not on file  Tobacco Use  . Smoking status: Former Smoker    Last attempt to quit: 08/10/1960    Years since quitting: 57.7  . Smokeless tobacco: Never Used  Substance and Sexual Activity  . Alcohol use: No    Alcohol/week: 0.0 oz  . Drug use: No  . Sexual activity: Never  Lifestyle  . Physical activity:    Days per week: Not on file    Minutes per session: Not on file  . Stress: Not on file  Relationships  . Social connections:    Talks on phone: Not on file    Gets together: Not on file    Attends religious service: Not on file    Active member of club or organization: Not on file    Attends meetings of clubs or organizations: Not on file    Relationship status: Not on file  . Intimate partner violence:    Fear of current or ex partner: Not on file    Emotionally abused: Not on file    Physically abused: Not on file    Forced sexual activity: Not on file  Other Topics Concern  . Not on file  Social History Narrative  . Not on file    FAMILY HISTORY: Family History  Problem Relation Age of Onset  . Cancer Father        throat/ bone  . Breast cancer Sister   . Cancer Sister        MEDULAR CANCER  . Cancer Brother        prostate  . Cancer Other        MEDULAR CANCER  . Breast cancer Maternal Aunt   . Cancer Paternal Aunt        pancreatic   .  Cancer Cousin        ON MOTHER'S SIDE- LIVER CANCER    ALLERGIES:  is allergic to codeine.  MEDICATIONS:  Current Outpatient Medications  Medication Sig Dispense Refill  . ALPRAZolam (XANAX) 1 MG tablet Take 0.5 tablets (0.5 mg total) by mouth 2 (two) times daily. 6 tablet 0  . Biotin 1000 MCG tablet Take 1,000 mcg by mouth daily.     . Calcium Carbonate-Vit D-Min (CALCIUM 600 + MINERALS PO) Take 1 tablet by mouth daily.     . cetirizine (ZYRTEC) 10 MG tablet Take 10 mg by mouth daily.    . Cholecalciferol (VITAMIN D) 1000 UNITS capsule Take 1,000 Units by mouth daily.      Marland Kitchen Cod Liver Oil 1000 MG CAPS Take 1,000 mg by mouth daily.     . Diphenhydramine-APAP, sleep, (TYLENOL PM EXTRA STRENGTH PO) Take 1 tablet by mouth at bedtime.     . diphenoxylate-atropine (LOMOTIL) 2.5-0.025 MG tablet Take 1 tablet by mouth 2 (two) times daily as needed for diarrhea or loose stools (refractory diarrhea from medullary thyroid cancer not responding to Loperamide). 60 tablet 0  . ferrous sulfate 325 (65 FE) MG tablet Take 1 tablet (325 mg total) by mouth 2 (two) times daily with a meal. 60 tablet 3  . Glucosamine-Chondroitin 250-200 MG CAPS Take 1 tablet by mouth daily.    Marland Kitchen levothyroxine (SYNTHROID, LEVOTHROID) 112 MCG tablet Take 112 mcg by mouth daily. /125MCG    . loperamide (IMODIUM A-D) 2 MG tablet Take 2 mg by mouth 3 (three) times daily.    . Multiple Vitamin (MULTIVITAMIN) tablet Take 1 tablet by mouth daily.      Marland Kitchen PRESCRIPTION MEDICATION Inject 1 application into the skin every 30 (thirty) days. To control diarrhea    . simethicone (MYLICON) 80 MG chewable tablet Chew 80 mg by mouth 3 (three) times daily.     Marland Kitchen  zoledronic acid (RECLAST) 5 MG/100ML SOLN Inject 5 mg into the vein once.       No current facility-administered medications for this visit.     REVIEW OF SYSTEMS:    A 10+ POINT REVIEW OF SYSTEMS WAS OBTAINED including neurology, dermatology, psychiatry, cardiac, respiratory, lymph,  extremities, GI, GU, Musculoskeletal, constitutional, breasts, reproductive, HEENT.  All pertinent positives are noted in the HPI.  All others are negative.    PHYSICAL EXAMINATION: ECOG PERFORMANCE STATUS: 1 - Symptomatic but completely ambulatory   Vitals:   04/13/18 1152  BP: (!) 112/55  Pulse: 81  Resp: 18  Temp: 98 F (36.7 C)  TempSrc: Oral  SpO2: 100%  Weight: 100 lb 8 oz (45.6 kg)  Height: _0  (1.6 m)    GENERAL:alert, in no acute distress and comfortable SKIN: no acute rashes, no significant lesions EYES: conjunctiva are pink and non-injected, sclera anicteric OROPHARYNX: MMM, no exudates, no oropharyngeal erythema or ulceration NECK: supple, no JVD LYMPH:  no palpable lymphadenopathy in the cervical, axillary or inguinal regions LUNGS: clear to auscultation b/l with normal respiratory effort HEART: regular rate & rhythm ABDOMEN:  normoactive bowel sounds , non tender, not distended. Extremity: no pedal edema PSYCH: alert & oriented x 3 with fluent speech NEURO: no focal motor/sensory deficits   LABORATORY DATA:  I have reviewed the data as listed  . CBC Latest Ref Rng & Units 04/13/2018 01/19/2018 12/12/2017  WBC 3.9 - 10.3 K/uL 4.6 5.3 5.1  Hemoglobin 11.6 - 15.9 g/dL 13.5 13.5 13.2  Hematocrit 34.8 - 46.6 % 40.1 40.6 40.4  Platelets 145 - 400 K/uL 182 190 186  hgb 13.5  . CMP Latest Ref Rng & Units 04/13/2018 01/19/2018 12/12/2017  Glucose 70 - 140 mg/dL 90 96 89  BUN 7 - 26 mg/dL _1 Creatinine 0.60 - 1.10 mg/dL 0.75 0.78 0.83  Sodium 136 - 145 mmol/L 143 142 144  Potassium 3.5 - 5.1 mmol/L 4.4 4.3 4.8  Chloride 98 - 109 mmol/L 105 104 107  CO2 22 - 29 mmol/L _2 Calcium 8.4 - 10.4 mg/dL 10.2 10.0 9.3  Total Protein 6.4 - 8.3 g/dL 7.1 6.9 7.0  Total Bilirubin 0.2 - 1.2 mg/dL 0.8 0.6 0.5  Alkaline Phos 40 - 150 U/L 80 89 101  AST 5 - 34 U/L _3 ALT 0 - 55 U/L _4 02/12/18 Calcitonin:    PATHOLOGY  Lung Biopsy at [facility]  09/23/14 DIAGNOSIS A. Pleural nodule, biopsy:  Calcified and hyalinized remote fat necrosis. No evidence of malignancy.   B. Level 9 lymph node, biopsy:  Metastatic medullary thyroid carcinoma. See comment.  Comment:: A note is made of the patient's history of medullary thyroid carcinoma with documented prior metastatic disease to cervical lymph nodes ([id number]; 04/05/2010).  The prior metastases to cervical lymph nodes are reviewed in conjunction to the current case and the tumors are morphologically similar.   Confirmatory immunohistochemical stains are performed. Tumor cells stain positively for calcitonin and for an anticytokeratin cocktail. The morphologic and immunophenotypic findings support metastatic medullary thyroid carcinoma.   C. Level 8 lymph node, biopsy:  Profiles of lymph nodes, negative for malignancy.   D. Pleural nodule #2, biopsy:  Fibrovascular tissue with extensive cauterization. No evidence of malignancy.        Surgical Pathology at [facility] 11/08/12 Immunohistochemical Findings An immunohistochemical stain for calcitonin obtained on paraffin block A1 is positive.   Diagnosis A. "  RIGHT LEVEL 6 LYMPH NODES" (BIOPSY):  METASTATIC MEDULLARY THYROID CARCINOMA IN TWO LYMPH NODES (2/2). SIZE OF LARGEST METASTASIS:2 CM. EXTRANODAL INVASION:PRESENT. Comment:  I certify that I personally conducted the diagnostic evaluation of the above  specimen(s) and have rendered the above diagnosis(es). Rex C. Telford Nab, M.D. [phone] Electronically signed: 11/16/12   A. "Right level 6 lymph nodes", received fresh and placed in formalin on 1/9/ two fragments of tan-red soft tissue.Fragment 1 is 1.3 x 1 x 0.6 cm and is bisected and submitted in block A1.Fragment 2 is 2 x 1 x 0.4 cm and is submitted entirely in block A2.    RADIOGRAPHIC STUDIES: I have personally reviewed the  radiological images as listed and agreed with the findings in the report. No results found.   CT Chest w Contrast w 3D MIPS Protocol at [facility] 09/29/17 Impression: 1.Mildly enlarged mediastinal lymph nodes are stable when compared to most recent CT. Although, several nodes are minimally increased from 03/08/2016.  2.Refer to separately dictated abdominal CT report for findings below the diaphragm.  Bone Scan Whole Body at [facility] 09/27/17 Impression: 1. Stable foci of increased radiotracer activity in the right frontal calvarium, indeterminate. 2. No new scintigraphic evidence of osseous metastasis.  US Lymph Node Neck Mapping at [facility] 08/29/17 Impression: 1.  Newly measured right level 5 lymph node (lymph node #5) with abnormal rounded morphology, no echogenic hilum, and microcalcifications. Although biopsy of this nodule may be technically difficult due to its location deep to the carotid vasculature, ultrasound-guided biopsy is recommended (if possible).  2.  Newly measured right level 2A lymph node without suspicious features.  3.  Additional right neck nodes are unchanged.   Bone Density Scan 02/27/17 @ [facility]  Lowest Site Measured: Total Left hip with T-score of -3.1 and BMD of 0.568   CT CAP W Contrast W MIPS at [facility] 02/13/17 Impression: 1. Please see CT neck completed same day for findings in the neck. 2. Stable nonenlarged mediastinal lymph nodes and soft tissue nodule in the right thyroidectomy bed. 3. Unchanged irregular opacities in the lung apices bilaterally, which may represent treated metastases or scarring. No new metastatic disease in the chest, abdomen, or pelvis.  CT Nect Soft Tissue W Contrast at [facility] 02/13/17 IMPRESSION: Numerous soft tissue enhancing lesions in the superior mediastinum. While these are stable when compared to most recent CT, several of these lesions demonstrate minimal growth from the more remote exam on 09/07/2015. No new enhancing lesions  are present.  ASSESSMENT & PLAN:   Shannon [name] is a 77 y.o. caucasian female with   1. 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  She had a level 6 LN dissection on 11/08/12 with 2/2 LN positive, largest 2cm for MTC.  09/05/14 CAP CT with development of new calcified nodules in the left lower lobe 09/23/14 Lung biopsy showed Level 9 lymph node, positive for malignancy and metastatic cancer which was resected. Treatment for symptomatic disease with vandetanib and cabozantibnib were previously discussed with her 55 Oncologist but the patient had decided to hold off on treatment due to concerns for toxicities and lack of overt cancer related progressive symptoms. Latest scans from 08/2017 are stable, no disease found below chest or in the bones. Her calcitonin levels have been increasing since 2015. Overall pt currently asymptomatic.   2. Chronic diarrhea, range from loose to liquid  stool, 3-4 times a day. -Secondary to hypercalcitoninemia  -Currently on imodium daily, cholestyramine twice daily and started monthly Sandostatin injections on 10/20/17.  -Her last colonoscopy was in 2010  PLAN:  Discussed pt labwork today, 04/13/18; albumin and total protein are normal, electrolytes are normal.  -Patient's symptoms and imaging have been stable and she will let me know if anything changes in her symptoms -Continue labs and radiographic imaging and medical oncologic f/u every 4-6 months at [facility] -Will see pt back as needed  -now off sandostatin per her preference. -no overt clinical or symptomatic progression to complex treatment options, such as vandetanib and cabozantibnib, for symptomatic disease . --Will continue imodium and cholestyramine for now.  -Will follow up with endocrinologist to manage her thyroid replacement and  will continue to follow Dr. Leamon Arnt for her primary oncology care and scheduled imaging studies q52month.  - 3. Osteoporosis - Last DEXA 02/27/17 with Lowest Site Measured: Total Left hip with T-score of -3.1 and BMD of 0.568 Closed left hip fracture from fall -s/p surgery on 10/23/17 with rod placement - healing well.  -She takes Reclast injections for her osteoporosis. Managed by her PCP    RTC with Dr KIrene Limboas needed   All of the patients questions were answered with apparent satisfaction. The patient knows to call the clinic with any problems, questions or concerns.  The toal time spent in the appt was 25 minutes and more than 50% was on counseling and direct patient cares.    GSullivan LoneMD MS AAHIVMS SNorth Pinellas Surgery CenterCMclaren OaklandHematology/Oncology Physician CWellstar [facility] (Office):       [phone](Work cell):  [phone](Fax):           [phone] 04/13/2018 12:55 PM  I, SBaldwin Jamaica am acting as a sEducation administratorfor Dr KIrene Limbo   .I have reviewed the above documentation for accuracy and completeness, and I agree with the above. .Brunetta GeneraMD

## 2018-04-13 ENCOUNTER — Inpatient Hospital Stay (HOSPITAL_BASED_OUTPATIENT_CLINIC_OR_DEPARTMENT_OTHER): Payer: Medicare Other | Admitting: Hematology

## 2018-04-13 ENCOUNTER — Inpatient Hospital Stay: Payer: Medicare Other | Attending: Hematology

## 2018-04-13 VITALS — BP 112/55 | HR 81 | Temp 98.0°F | Resp 18 | Ht 63.0 in | Wt 100.5 lb

## 2018-04-13 DIAGNOSIS — R197 Diarrhea, unspecified: Secondary | ICD-10-CM | POA: Diagnosis not present

## 2018-04-13 DIAGNOSIS — C73 Malignant neoplasm of thyroid gland: Secondary | ICD-10-CM | POA: Diagnosis not present

## 2018-04-13 DIAGNOSIS — C7800 Secondary malignant neoplasm of unspecified lung: Secondary | ICD-10-CM | POA: Diagnosis not present

## 2018-04-13 DIAGNOSIS — M810 Age-related osteoporosis without current pathological fracture: Secondary | ICD-10-CM | POA: Diagnosis not present

## 2018-04-13 DIAGNOSIS — E070 Hypersecretion of calcitonin: Secondary | ICD-10-CM

## 2018-04-13 LAB — CBC WITH DIFFERENTIAL (CANCER CENTER ONLY)
BASOS ABS: 0.1 10*3/uL (ref 0.0–0.1)
Basophils Relative: 2 %
EOS ABS: 0.1 10*3/uL (ref 0.0–0.5)
EOS PCT: 2 %
HCT: 40.1 % (ref 34.8–46.6)
Hemoglobin: 13.5 g/dL (ref 11.6–15.9)
LYMPHS ABS: 0.8 10*3/uL — AB (ref 0.9–3.3)
LYMPHS PCT: 17 %
MCH: 30.1 pg (ref 25.1–34.0)
MCHC: 33.8 g/dL (ref 31.5–36.0)
MCV: 89 fL (ref 79.5–101.0)
Monocytes Absolute: 0.4 10*3/uL (ref 0.1–0.9)
Monocytes Relative: 9 %
Neutro Abs: 3.3 10*3/uL (ref 1.5–6.5)
Neutrophils Relative %: 70 %
PLATELETS: 182 10*3/uL (ref 145–400)
RBC: 4.5 MIL/uL (ref 3.70–5.45)
RDW: 14.5 % (ref 11.2–14.5)
WBC: 4.6 10*3/uL (ref 3.9–10.3)

## 2018-04-13 LAB — CMP (CANCER CENTER ONLY)
ALT: 14 U/L (ref 0–55)
AST: 25 U/L (ref 5–34)
Albumin: 4.4 g/dL (ref 3.5–5.0)
Alkaline Phosphatase: 80 U/L (ref 40–150)
Anion gap: 9 (ref 3–11)
BILIRUBIN TOTAL: 0.8 mg/dL (ref 0.2–1.2)
BUN: 17 mg/dL (ref 7–26)
CO2: 29 mmol/L (ref 22–29)
CREATININE: 0.75 mg/dL (ref 0.60–1.10)
Calcium: 10.2 mg/dL (ref 8.4–10.4)
Chloride: 105 mmol/L (ref 98–109)
GFR, Est AFR Am: >60 mL/min (ref >60)
GLUCOSE: 90 mg/dL (ref 70–140)
POTASSIUM: 4.4 mmol/L (ref 3.5–5.1)
Sodium: 143 mmol/L (ref 136–145)
TOTAL PROTEIN: 7.1 g/dL (ref 6.4–8.3)

## 2018-04-13 LAB — MAGNESIUM: Magnesium: 2 mg/dL (ref 1.7–2.4)

## 2018-04-16 ENCOUNTER — Telehealth: Payer: Self-pay

## 2018-04-16 NOTE — Telephone Encounter (Signed)
RTC with Dr Irene Limbo as needed. Per 6/14 no los

## 2018-06-07 ENCOUNTER — Telehealth: Payer: Self-pay | Admitting: *Deleted

## 2018-06-07 NOTE — Telephone Encounter (Signed)
Shannon [name] called from [facility] family practice asking when last reclast was done, our records show last done in 03/13/17. Per note on 02/15/18 "Pt states she is seeing another gynecologist office they will take over her reclast"  I relayed this information to [place].

## 2018-07-13 ENCOUNTER — Other Ambulatory Visit (HOSPITAL_COMMUNITY): Payer: Self-pay | Admitting: *Deleted

## 2018-07-16 ENCOUNTER — Ambulatory Visit (HOSPITAL_COMMUNITY)
Admission: RE | Admit: 2018-07-16 | Discharge: 2018-07-16 | Disposition: A | Discharge status: Home / Self Care | Payer: Medicare Other | Source: Ambulatory Visit | Attending: Family Medicine | Admitting: Family Medicine

## 2018-07-16 DIAGNOSIS — M810 Age-related osteoporosis without current pathological fracture: Secondary | ICD-10-CM | POA: Insufficient documentation

## 2018-07-16 MED ORDER — ZOLEDRONIC ACID 5 MG/100ML IV SOLN: 5.0000 mg | Freq: Once | INTRAVENOUS | Status: DC

## 2018-07-16 MED ORDER — ZOLEDRONIC ACID 5 MG/100ML IV SOLN
INTRAVENOUS | Status: AC
  Administered 2018-07-16: 5 mg
  Filled 2018-07-16: qty 100

## 2018-10-11 ENCOUNTER — Emergency Department (HOSPITAL_COMMUNITY): Payer: Medicare Other

## 2018-10-11 ENCOUNTER — Encounter (HOSPITAL_COMMUNITY): Payer: Self-pay | Admitting: Emergency Medicine

## 2018-10-11 ENCOUNTER — Emergency Department (HOSPITAL_COMMUNITY)
Admission: EM | Admit: 2018-10-11 | Discharge: 2018-10-11 | Disposition: A | Discharge status: Home / Self Care | Payer: Medicare Other | Attending: Emergency Medicine | Admitting: Emergency Medicine

## 2018-10-11 DIAGNOSIS — J189 Pneumonia, unspecified organism: Secondary | ICD-10-CM

## 2018-10-11 DIAGNOSIS — Z79899 Other long term (current) drug therapy: Secondary | ICD-10-CM | POA: Diagnosis not present

## 2018-10-11 DIAGNOSIS — R0789 Other chest pain: Secondary | ICD-10-CM | POA: Diagnosis present

## 2018-10-11 DIAGNOSIS — E039 Hypothyroidism, unspecified: Secondary | ICD-10-CM | POA: Insufficient documentation

## 2018-10-11 DIAGNOSIS — Z87891 Personal history of nicotine dependence: Secondary | ICD-10-CM | POA: Diagnosis not present

## 2018-10-11 DIAGNOSIS — J181 Lobar pneumonia, unspecified organism: Secondary | ICD-10-CM | POA: Insufficient documentation

## 2018-10-11 LAB — COMPREHENSIVE METABOLIC PANEL
ALK PHOS: 68 U/L (ref 38–126)
ALT: 36 U/L (ref 0–44)
ANION GAP: 14 (ref 5–15)
AST: 38 U/L (ref 15–41)
Albumin: 3.7 g/dL (ref 3.5–5.0)
BUN: 9 mg/dL (ref 8–23)
CHLORIDE: 102 mmol/L (ref 98–111)
CO2: 23 mmol/L (ref 22–32)
Calcium: 9.4 mg/dL (ref 8.9–10.3)
Creatinine, Ser: 0.73 mg/dL (ref 0.44–1.00)
Glucose, Bld: 123 mg/dL — ABNORMAL HIGH (ref 70–99)
Potassium: 3.8 mmol/L (ref 3.5–5.1)
Sodium: 139 mmol/L (ref 135–145)
TOTAL PROTEIN: 6.2 g/dL — AB (ref 6.5–8.1)
Total Bilirubin: 1.2 mg/dL (ref 0.3–1.2)

## 2018-10-11 LAB — CBC WITH DIFFERENTIAL/PLATELET
Abs Immature Granulocytes: 0.02 10*3/uL (ref 0.00–0.07)
BASOS PCT: 1 %
Basophils Absolute: 0 10*3/uL (ref 0.0–0.1)
Eosinophils Absolute: 0.2 10*3/uL (ref 0.0–0.5)
Eosinophils Relative: 2 %
HCT: 39.9 % (ref 36.0–46.0)
Hemoglobin: 12.8 g/dL (ref 12.0–15.0)
Immature Granulocytes: 0 %
Lymphocytes Relative: 8 %
Lymphs Abs: 0.7 10*3/uL (ref 0.7–4.0)
MCH: 28.8 pg (ref 26.0–34.0)
MCHC: 32.1 g/dL (ref 30.0–36.0)
MCV: 89.7 fL (ref 80.0–100.0)
MONOS PCT: 11 %
Monocytes Absolute: 1 10*3/uL (ref 0.1–1.0)
NEUTROS ABS: 6.9 10*3/uL (ref 1.7–7.7)
Neutrophils Relative %: 78 %
Platelets: 221 10*3/uL (ref 150–400)
RBC: 4.45 MIL/uL (ref 3.87–5.11)
RDW: 13.2 % (ref 11.5–15.5)
WBC: 8.8 10*3/uL (ref 4.0–10.5)
nRBC: 0 % (ref 0.0–0.2)

## 2018-10-11 LAB — I-STAT TROPONIN, ED: TROPONIN I, POC: 0.01 ng/mL (ref 0.00–0.08)

## 2018-10-11 MED ORDER — ALPRAZOLAM 0.5 MG PO TABS
0.50 | ORAL_TABLET | ORAL | Status: DC
Start: 2018-10-09 — End: 2018-10-11

## 2018-10-11 MED ORDER — ENOXAPARIN SODIUM 30 MG/0.3ML ~~LOC~~ SOLN
30.00 | SUBCUTANEOUS | Status: DC
Start: ? — End: 2018-10-11

## 2018-10-11 MED ORDER — CHOLESTYRAMINE 4 G PO PACK
4.00 | PACK | ORAL | Status: DC
Start: 2018-10-09 — End: 2018-10-11

## 2018-10-11 MED ORDER — CHOLECALCIFEROL 25 MCG (1000 UT) PO TABS
1000.00 | ORAL_TABLET | ORAL | Status: DC
Start: 2018-10-10 — End: 2018-10-11

## 2018-10-11 MED ORDER — ACETAMINOPHEN 325 MG PO TABS
975.00 | ORAL_TABLET | ORAL | Status: DC
Start: 2018-10-09 — End: 2018-10-11

## 2018-10-11 MED ORDER — GENERIC EXTERNAL MEDICATION
8.00 | Status: DC
Start: ? — End: 2018-10-11

## 2018-10-11 MED ORDER — OXYCODONE HCL 5 MG PO TABS
5.00 | ORAL_TABLET | ORAL | Status: DC
Start: ? — End: 2018-10-11

## 2018-10-11 MED ORDER — LEVOTHYROXINE SODIUM 112 MCG PO TABS
112.00 | ORAL_TABLET | ORAL | Status: DC
Start: 2018-10-10 — End: 2018-10-11

## 2018-10-11 MED ORDER — FEXOFENADINE HCL 60 MG PO TABS
60.00 | ORAL_TABLET | ORAL | Status: DC
Start: ? — End: 2018-10-11

## 2018-10-11 MED ORDER — MENTHOL 7.6 MG MT LOZG
1.00 | LOZENGE | OROMUCOSAL | Status: DC
Start: ? — End: 2018-10-11

## 2018-10-11 MED ORDER — AZITHROMYCIN 250 MG PO TABS
500.0000 mg | ORAL_TABLET | Freq: Once | ORAL | Status: AC
  Administered 2018-10-11: 500 mg via ORAL
  Filled 2018-10-11: qty 2

## 2018-10-11 MED ORDER — CAFFEINE 200 MG PO TABS
100.00 | ORAL_TABLET | ORAL | Status: DC
Start: 2018-10-10 — End: 2018-10-11

## 2018-10-11 MED ORDER — AZITHROMYCIN 250 MG PO TABS: 250.0000 mg | ORAL_TABLET | Freq: Every day | ORAL | 0 refills | Status: DC

## 2018-10-11 MED ORDER — LOPERAMIDE HCL 2 MG PO CAPS
2.00 | ORAL_CAPSULE | ORAL | Status: DC
Start: ? — End: 2018-10-11

## 2018-10-11 MED ORDER — IBUPROFEN 600 MG PO TABS
600.00 | ORAL_TABLET | ORAL | Status: DC
Start: 2018-10-09 — End: 2018-10-11

## 2018-10-11 MED ORDER — CEFDINIR 300 MG PO CAPS: 300.0000 mg | ORAL_CAPSULE | Freq: Two times a day (BID) | ORAL | 0 refills | Status: DC

## 2018-10-11 MED ORDER — CEFDINIR 300 MG PO CAPS
300.0000 mg | ORAL_CAPSULE | Freq: Two times a day (BID) | ORAL | Status: DC
  Administered 2018-10-11: 300 mg via ORAL
  Filled 2018-10-11: qty 1

## 2018-10-11 MED ORDER — IOPAMIDOL (ISOVUE-370) INJECTION 76%
INTRAVENOUS | Status: AC
  Administered 2018-10-11: 100 mL
  Filled 2018-10-11: qty 100

## 2018-10-11 NOTE — ED Notes (Signed)
ED Provider at bedside. 

## 2018-10-11 NOTE — ED Triage Notes (Addendum)
Pt reports she was awoken by R sided chest pain that radiated into her neck this am. States she had a hysterectomy last Friday. Pt denies nausea but reports some sob. Pt a/ox4, resp e/u, nad. Family reports pt is currently on lovenox.

## 2018-10-11 NOTE — Discharge Instructions (Addendum)
You had a CT scan performed of your chest today that showed some changes to your right upper lung that may be pneumonia, scar or cancer. Please follow-up with your family doctor so they can compare today's studies to your review of studies. Get rechecked immediately if you develop any new or concerning symptoms.

## 2018-10-11 NOTE — ED Notes (Signed)
Patient denies pain and is resting comfortably.  

## 2018-10-11 NOTE — ED Notes (Signed)
Patient transported to CT 

## 2018-10-11 NOTE — ED Provider Notes (Signed)
[facility] EMERGENCY DEPARTMENT Provider Note   CSN: [id number] Arrival date & time: 10/11/18  0909     History   Chief Complaint Chief Complaint  Patient presents with  . Chest Pain    HPI [name] is a 77 y.o. female.  The history is provided by the patient, a relative and medical records. No language interpreter was used.  Chest Pain     [name] is a 77 y.o. female who presents to the Emergency Department complaining of chest pain. She is postop day six following open hysterectomy for uterine mass. She has a history of metastatic thyroid cancer. This morning at 330 with a sharp, pleuritic right sided chest pain that radiates to her right neck. Pain is constant nature and has not improved despite Tylenol and oxycodone. She denies any fevers, shortness of breath, nausea, vomiting, diarrhea, hematochezia, melena. She is on prophylactic Lovenox, 30 mg subcu daily since hospital discharge two days ago. No history of prior DVT or cardiac disease. Denies any leg swelling or pain. She has been at home since the surgery and is ambulatory. She did have a similar episode while she was in the hospital and cardiac markers were checked at that time according to family. Symptoms are severe in nature. Past Medical History:  Diagnosis Date  . Anxiety   . Breast cancer ([place]) 1999   left lumpectomy/radiation/ductal carcinoma in situ  . Cancer ([name])   . Colon polyp   . Contact lens/glasses fitting    HAS LENS IMPLANTS  . Lung cancer ([place]) 2015   LYMPHNODE REMOVAL   . 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 Osteoporosis     Patient Active Problem List   Diagnosis Date Noted  . Diarrhea 12/12/2017  . 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 Thyroid cancer, medullary carcinoma ([name]) 10/24/2017  . Closed left hip fracture ([name]) 10/23/2017  . Hip fracture ([name]) 10/23/2017  . Hydrosalpinx 12/23/2015  . 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 Bloating symptom  08/17/2012  . Osteoporosis 08/11/2011  . History of breast cancer in female 08/11/2011  . Hypothyroidism 08/11/2011  . Vitamin D deficiency 08/11/2011  . Thyroid cancer ([name]) 08/11/2011  . History of colonic polyps 08/11/2011    Past Surgical History:  Procedure Laterality Date  . ABDOMINAL HYSTERECTOMY    . ABDOMINAL SURGERY  1989   RUPTURED APPENDIX  . BREAST SURGERY  1999   LEFT BREAST LUMPECTOMY. DUCTAL CARCINOMA IN SITU./ RADIATION  . CATARACT EXTRACTION W/ INTRAOCULAR LENS IMPLANT  2009   BOTH EYES  . FEMUR IM NAIL Left 10/23/2017   Procedure: INTRAMEDULLARY (IM) NAIL FEMORAL;  Surgeon: Paralee Cancel, MD;  Location: [name];  Service: Orthopedics;  Laterality: Left;  . FOOT SURGERY  1070   RIGHT FOOT - PINCHED NERVE  . LUNG SURGERY     CALCIFICATIONS, POSITIVE - CANCER LYMPHNODE   . MELANOMA EXCISION  2008   CHEST AREA  . NECK SURGERY  Nov 09 2011   LYMPHNODES REMOVED 2 POSITIVE   . RIGHT COLECTOMY  06/02/2009  . THYROIDECTOMY  11/06/2009  . TONSILLECTOMY AND ADENOIDECTOMY       OB History    Gravida  2   Para  2   Term      Preterm      AB      Living  2     SAB      TAB      Ectopic  Multiple      Live Births               Home Medications    Prior to Admission medications   Medication Sig Start Date End Date Taking? Authorizing Provider  ALPRAZolam Duanne Moron) 1 MG tablet Take 0.5 tablets (0.5 mg total) by mouth 2 (two) times daily. 10/27/17  Yes Nita Sells, MD  Biotin 1000 MCG tablet Take 1,000 mcg by mouth daily.    Yes [provider]  caffeine 200 MG TABS tablet Take 100 mg by mouth every morning.   Yes [provider]  Calcium Carbonate-Vit D-Min (CALCIUM 600 + MINERALS PO) Take 1 tablet by mouth daily.    Yes [provider]  cetirizine (ZYRTEC) 10 MG tablet Take 10 mg by mouth daily.   Yes [provider]  Cholecalciferol (VITAMIN D) 1000 UNITS capsule Take 1,000 Units by mouth daily.      Yes [provider]  cholestyramine (QUESTRAN) 4 g packet Take 4 g by mouth 2 (two) times daily. 08/22/16  Yes [provider]  Cod Liver Oil 1000 MG CAPS Take 1,000 mg by mouth daily.    Yes [provider]  Diphenhydramine-APAP, sleep, (TYLENOL PM EXTRA STRENGTH PO) Take 1 tablet by mouth at bedtime.    Yes [provider]  diphenoxylate-atropine (LOMOTIL) 2.5-0.025 MG tablet Take 1 tablet by mouth 2 (two) times daily as needed for diarrhea or loose stools (refractory diarrhea from medullary thyroid cancer not responding to Loperamide). 01/19/18  Yes Brunetta Genera, MD  enoxaparin (LOVENOX) 30 MG/0.3ML injection Inject 30 mg into the skin daily. 10/10/18 11/03/18 Yes [provider]  Glucosamine-Chondroitin 250-200 MG CAPS Take 1 tablet by mouth daily. Name of table is SCHIFF 12/23/10  Yes [provider]  ibuprofen (ADVIL,MOTRIN) 600 MG tablet Take 600 mg by mouth every 6 (six) hours. 10/09/18 10/19/18 Yes [provider]  levothyroxine (SYNTHROID, LEVOTHROID) 112 MCG tablet Take 112 mcg by mouth daily. /125MCG   Yes [provider]  loperamide (IMODIUM A-D) 2 MG tablet Take 2 mg by mouth 3 (three) times daily.   Yes [provider]  Multiple Vitamin (MULTIVITAMIN) tablet Take 1 tablet by mouth daily.     Yes [provider]  oxyCODONE (OXY IR/ROXICODONE) 5 MG immediate release tablet Take 5 mg by mouth every 4 (four) hours as needed for pain. 10/09/18  Yes [provider]  simethicone (MYLICON) 80 MG chewable tablet Chew 80 mg by mouth 3 (three) times daily.    Yes [provider]  zoledronic acid (RECLAST) 5 MG/100ML SOLN Inject 5 mg into the vein once.     Yes [provider]  azithromycin (ZITHROMAX) 250 MG tablet Take 1 tablet (250 mg total) by mouth daily. 10/12/18   Quintella Reichert, MD  cefdinir (OMNICEF) 300 MG capsule Take 1 capsule (300 mg total) by mouth 2 (two) times  daily. 10/11/18   Quintella Reichert, MD  ferrous sulfate 325 (65 FE) MG tablet Take 1 tablet (325 mg total) by mouth 2 (two) times daily with a meal. Patient not taking: Reported on 10/11/2018 10/26/17   Debbe Odea, MD    Family History Family History  Problem Relation Age of Onset  . Cancer Father        throat/ bone  . Breast cancer Sister   . Cancer Sister        MEDULAR CANCER  . Cancer Brother  prostate  . Cancer Other        MEDULAR CANCER  . Breast cancer Maternal Aunt   . Cancer Paternal Aunt        pancreatic   . Cancer Cousin        ON MOTHER'S SIDE- LIVER CANCER    Social History Social History   Tobacco Use  . Smoking status: Former Smoker    Last attempt to quit: 08/10/1960    Years since quitting: 58.2  . Smokeless tobacco: Never Used  Substance Use Topics  . Alcohol use: No    Alcohol/week: 0.0 standard drinks  . Drug use: No     Allergies   Codeine   Review of Systems Review of Systems  Cardiovascular: Positive for chest pain.  All other systems reviewed and are negative.    Physical Exam Updated Vital Signs BP 132/66   Pulse (!) 103   Temp 98.2 F (36.8 C) (Oral)   Resp 20   SpO2 97%   Physical Exam Vitals signs and nursing note reviewed.  Constitutional:      Appearance: Normal appearance. She is well-developed.     Comments: Appears uncomfortable  HENT:     Head: Normocephalic and atraumatic.  Cardiovascular:     Rate and Rhythm: Regular rhythm.     Heart sounds: No murmur.     Comments: tachycardic Pulmonary:     Effort: Pulmonary effort is normal. No respiratory distress.     Breath sounds: Normal breath sounds.  Abdominal:     Comments: Healing midline abdominal incision site with Steri-Strips in place. There is surrounding ecchymosis in various stages of healing. There is a small palpable hematoma in the right lower quadrant adjacent to the incisional site with mild local tenderness. There is no erythema or edema  in this region.  Musculoskeletal:        General: No swelling or tenderness.  Skin:    General: Skin is warm and dry.     Capillary Refill: Capillary refill takes less than 2 seconds.  Neurological:     Mental Status: She is alert and oriented to person, place, and time.  Psychiatric:        Mood and Affect: Mood normal.        Behavior: Behavior normal.      ED Treatments / Results  Labs (all labs ordered are listed, but only abnormal results are displayed) Labs Reviewed  COMPREHENSIVE METABOLIC PANEL - Abnormal; Notable for the following components:      Result Value   Glucose, Bld 123 (*)    Total Protein 6.2 (*)    All other components within normal limits  CBC WITH DIFFERENTIAL/PLATELET  I-STAT TROPONIN, ED    EKG EKG Interpretation  Date/Time:  Thursday October 11 2018 09:24:18 EST Ventricular Rate:  99 PR Interval:    QRS Duration: 116 QT Interval:  364 QTC Calculation: 468 R Axis:   54 Text Interpretation:  Sinus tachycardia Atrial premature complex Nonspecific intraventricular conduction delay Low voltage, extremity and precordial leads Confirmed by Quintella Reichert [phone]) on 10/11/2018 9:43:13 AM   Radiology Ct Angio Chest Pe W/cm &/or Wo Cm  Result Date: 10/11/2018 CLINICAL DATA:  Shortness of breath.  Recent hysterectomy EXAM: CT ANGIOGRAPHY CHEST WITH CONTRAST TECHNIQUE: Multidetector CT imaging of the chest was performed using the standard protocol during bolus administration of intravenous contrast. Multiplanar CT image reconstructions and MIPs were obtained to evaluate the vascular anatomy. CONTRAST:  57 mL ISOVUE-370 IOPAMIDOL (ISOVUE-370)  INJECTION 76% COMPARISON:  Chest radiograph October 11, 2018 FINDINGS: Cardiovascular: There is no demonstrable pulmonary embolus. There is no thoracic aortic aneurysm or dissection. There are foci of calcification in the proximal visualized great vessels without hemodynamically significant obstruction evident. There  are foci of aortic atherosclerosis. There are foci of coronary artery calcification. There is no pericardial effusion or pericardial thickening. Mediastinum/Nodes: There is postoperative change in the lower neck region with evidence of previous thyroidectomy. There is no appreciable thoracic adenopathy. No esophageal lesions are appreciable. Lungs/Pleura: There is fibrosis and cicatrization in the lung apices bilaterally with areas of pleural calcification in each apex. There is consolidation in the apical segment of the right upper lobe with areas of calcification. Elsewhere, there is patchy atelectasis bilaterally. No airspace consolidation outside of the right apex noted. No pleural effusion noted. Upper Abdomen: Visualized upper abdominal structures appear normal except for mild reflux of contrast into the inferior vena cava and hepatic veins. Musculoskeletal: There is a sclerotic bone lesion involving the posterior aspect of the T3 vertebral body along its leftward aspect. This area is concerning for a potential sclerotic metastatic focus. A second sclerotic focus is seen in the superior aspect of the L1 vertebral body, incompletely visualized on this study. There are no evident lytic or destructive bone lesions. No chest wall lesions evident. Bone lesions. Review of the MIP images confirms the above findings. IMPRESSION: 1. No demonstrable pulmonary embolus. No thoracic aortic aneurysm or dissection. There are foci of aortic atherosclerosis as well as great vessel and coronary artery calcifications. 2. Fibrosis and cicatrization in the apices with apical pleural calcification, likely due to scarring. There is focal airspace opacity which is asymmetric in the apical segment of the right upper lobe. This finding may well be due to chronic scarring. A potential focus of pneumonia or possible neoplastic involvement in this area must be of concern. This finding may well warrant correlation with nuclear medicine PET  study to assess for abnormal metabolic activity. At a minimum, a follow-up chest CT in 3 months to assess for stability of this area would be advised. No other airspace consolidation noted. Areas of patchy atelectasis noted. No well-defined nodular type lesions noted in the lung parenchyma. 3. Probable sclerotic bony metastases at C3 and L1. Sclerotic focus at L1 is incompletely visualized. Medullary thyroid carcinoma as well as breast carcinoma potentially may present with sclerotic as opposed to lytic metastases. 4.  No demonstrable thoracic adenopathy. 5. Extensive postoperative change in the thyroid region with evidence of total thyroidectomy. 6. Mild reflux into the inferior vena cava and hepatic veins suggests increase in right heart pressure. Aortic Atherosclerosis (ICD10-I70.0). Electronically Signed   By: Lowella Grip III M.D.   On: 10/11/2018 10:54   Dg Chest Port 1 View  Result Date: 10/11/2018 CLINICAL DATA:  Chest pain EXAM: PORTABLE CHEST 1 VIEW COMPARISON:  May 29, 2009 FINDINGS: There is postoperative change in the upper thoracic and lower cervical regions. There is scarring in each lung apex region. There is no evident edema or consolidation. Heart size and pulmonary vascularity are normal. No adenopathy. There is aortic atherosclerosis. No bone lesions are appreciable. Surgical clips in left axillary region noted. IMPRESSION: Bilateral apical scarring. No edema or consolidation. Heart size normal. There is aortic atherosclerosis. Postoperative changes noted in the visualized upper thoracic region. Aortic Atherosclerosis (ICD10-I70.0). Electronically Signed   By: Lowella Grip III M.D.   On: 10/11/2018 10:16    Procedures Procedures (including critical care time)  Medications Ordered in ED Medications  azithromycin (ZITHROMAX) tablet 500 mg (has no administration in time range)  cefdinir (OMNICEF) capsule 300 mg (has no administration in time range)  iopamidol (ISOVUE-370)  76 % injection (100 mLs  Contrast Given 10/11/18 1036)     Initial Impression / Assessment and Plan / ED Course  I have reviewed the triage vital signs and the nursing notes.  Pertinent labs & imaging results that were available during my care of the patient were reviewed by me and considered in my medical decision making (see chart for details).     Patient presents to the emergency department complaining of pleuritic right sided chest pain, she is recently postoperative following open hysterectomy. Initial concern for possible PE and CTA was obtained. CT is negative for PE but does demonstrate changes to the right upper lobe concerning for pneumonia versus tumor versus scarring. In setting of her new onset pain will treat for possible pneumonia. Recent imaging is not available at our facility but records are available in care everywhere regarding her CT scans at [facility]. Prior CT scans do not mention any asymmetry in her apical scarring. Discussed with patient finding of CT scan importance of outpatient follow-up. Return precautions discussed.  Current presentation is not consistent with sepsis, CHF, dissection, ACS.  Final Clinical Impressions(s) / ED Diagnoses   Final diagnoses:  Atypical chest pain  Community acquired pneumonia of right upper lobe of lung [facility])    ED Discharge Orders         Ordered    azithromycin (ZITHROMAX) 250 MG tablet  Daily     10/11/18 1136    cefdinir (OMNICEF) 300 MG capsule  2 times daily     10/11/18 1136           Quintella Reichert, MD 10/11/18 1142

## 2018-10-11 NOTE — ED Notes (Signed)
D/c reviewed with patient and family. Encouraged to adhere Antibiotic regimen as ordered

## 2019-02-15 IMAGING — DX DG CHEST 1V PORT
1 series · 1 of 1 positions shown · non-contrast
Comparison: May 29, 2009

CLINICAL DATA: Chest pain

EXAM:
PORTABLE CHEST 1 VIEW

[chest]
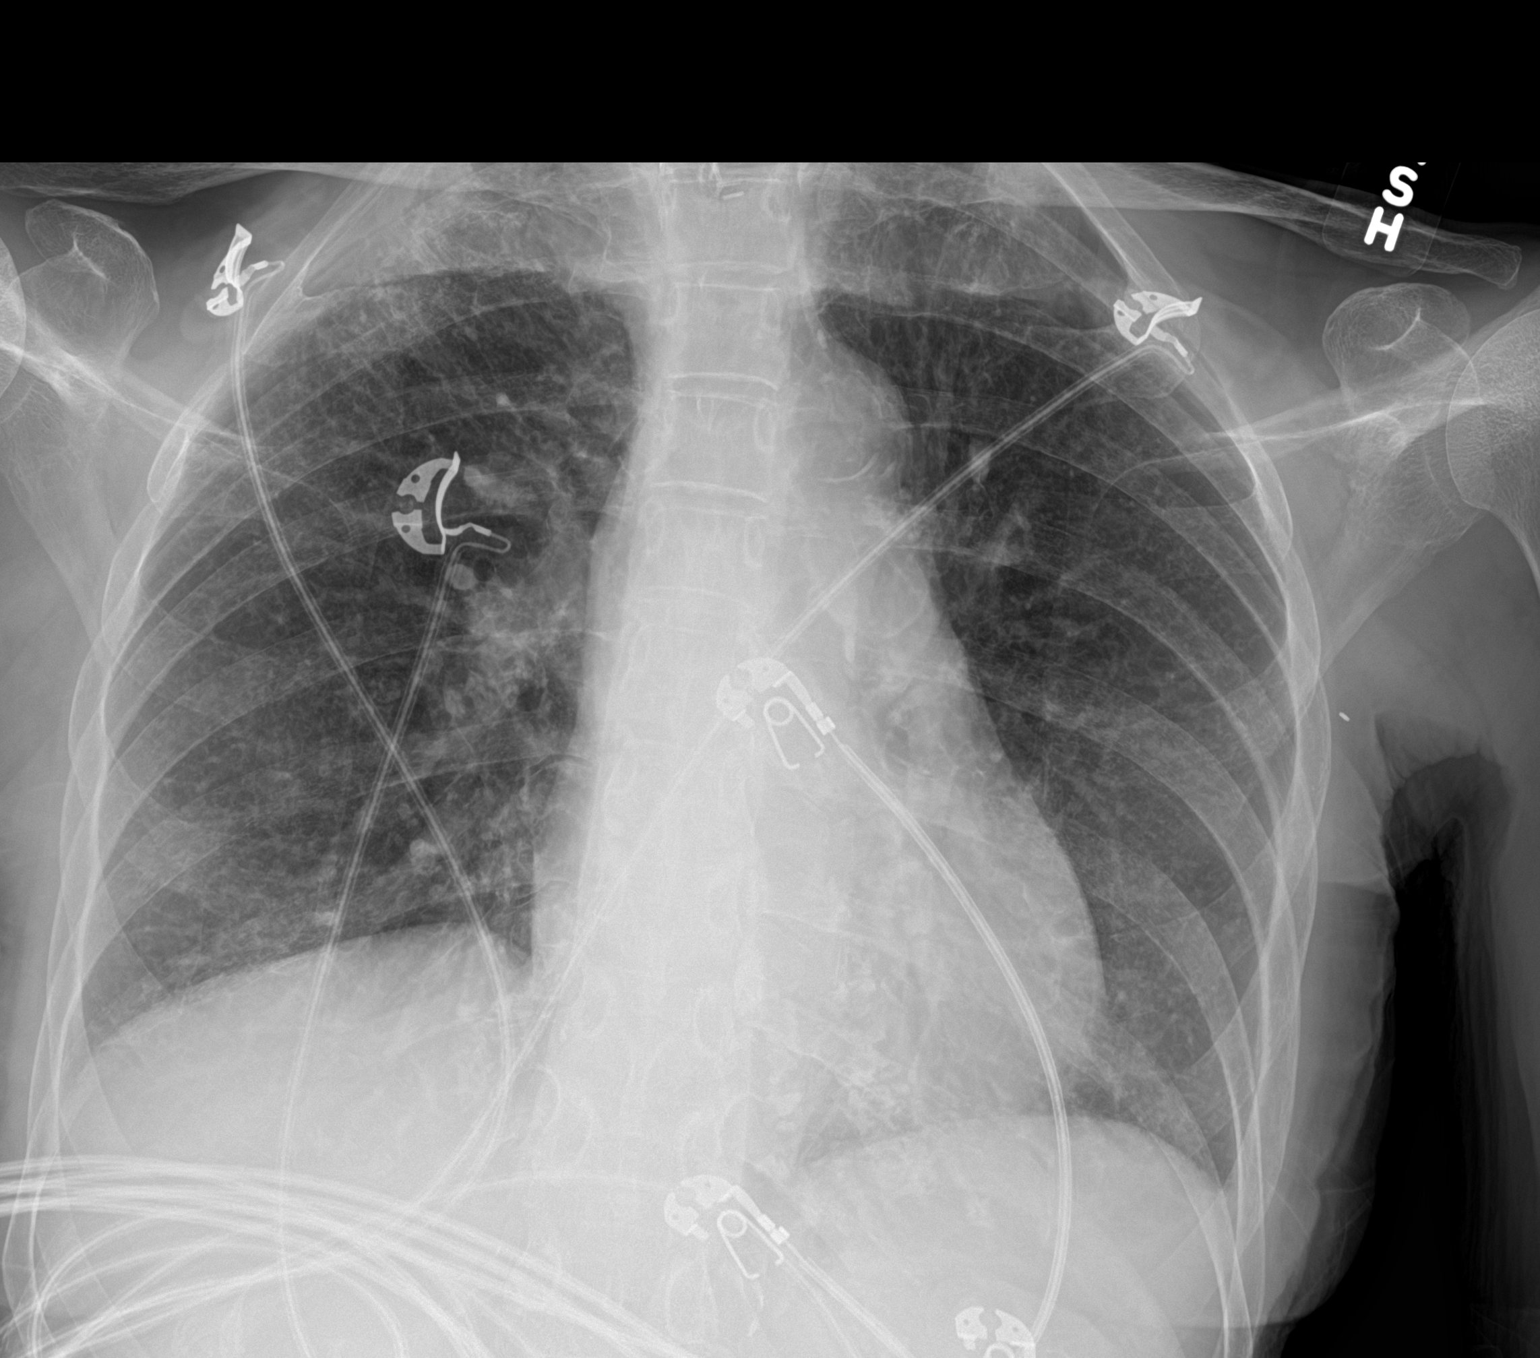

[1 of 1 positions shown; findings below may reference images not displayed]

FINDINGS: There is postoperative change in the upper thoracic and lower
cervical regions. There is scarring in each lung apex region. There
is no evident edema or consolidation. Heart size and pulmonary
vascularity are normal. No adenopathy. There is aortic
atherosclerosis. No bone lesions are appreciable. Surgical clips in
left axillary region noted.
IMPRESSION: Bilateral apical scarring. No edema or consolidation. Heart size
normal. There is aortic atherosclerosis. Postoperative changes noted
in the visualized upper thoracic region.

Aortic Atherosclerosis ([id number]-YSJ.J).

## 2019-02-15 IMAGING — CT CT ANGIO CHEST
2 of 7 series · 17 of 46 positions shown · IV contrast (APPLIED)
Comparison: Chest radiograph October 11, 2018

CLINICAL DATA: Shortness of breath.  Recent hysterectomy

EXAM:
CT ANGIOGRAPHY CHEST WITH CONTRAST
TECHNIQUE: Multidetector CT imaging of the chest was performed using the
standard protocol during bolus administration of intravenous
contrast. Multiplanar CT image reconstructions and MIPs were
obtained to evaluate the vascular anatomy.
CONTRAST:  57 mL 6GSEUZ-Z1N IOPAMIDOL (6GSEUZ-Z1N) INJECTION 76%

[Series 7: thins · axial · 0.53mm/px · z∈[+881,+1127]mm · 14 of 397 slices shown]
[im 23/397  lung]
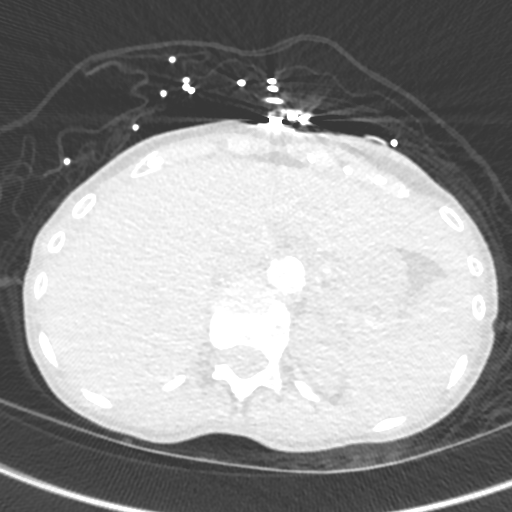
[im 45/397  soft-tissue]
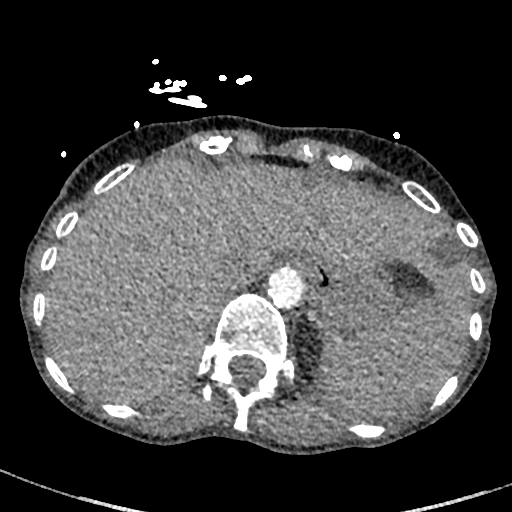
[im 89/397  lung]
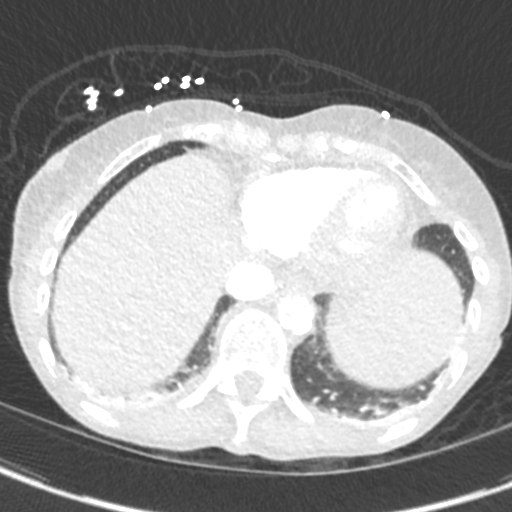
[im 111/397  soft-tissue]
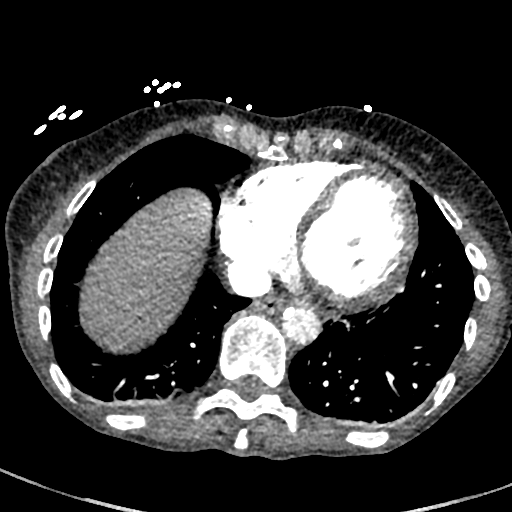
[im 133/397  lung]
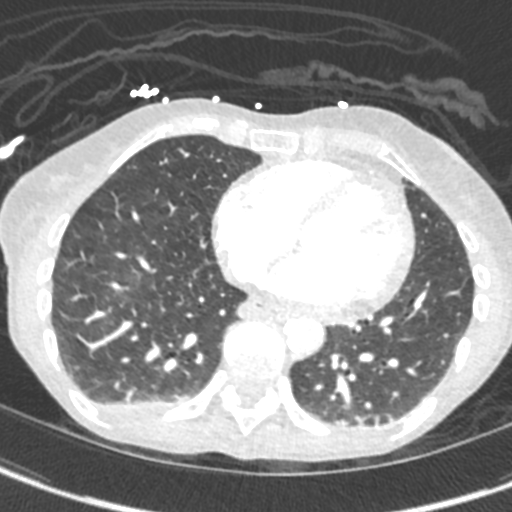
[im 155/397  soft-tissue]
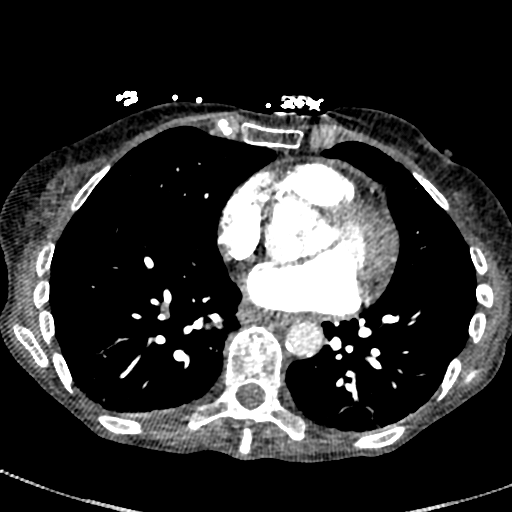
[im 177/397  lung]
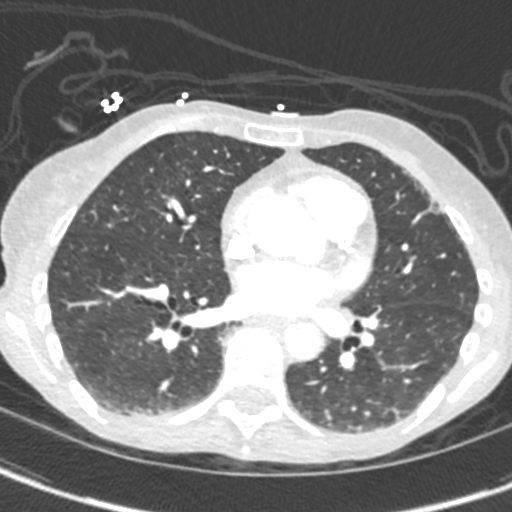
[im 221/397  soft-tissue]
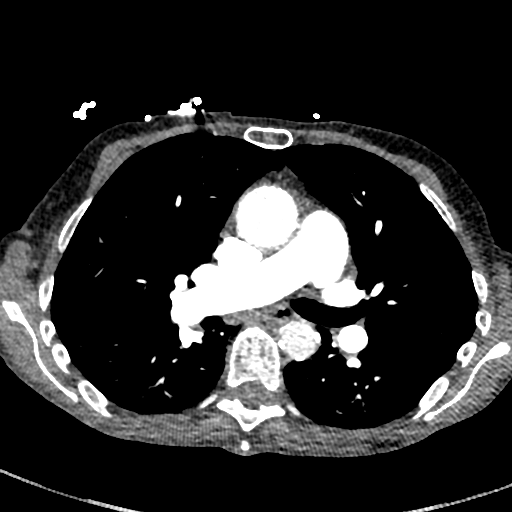
[im 243/397  lung]
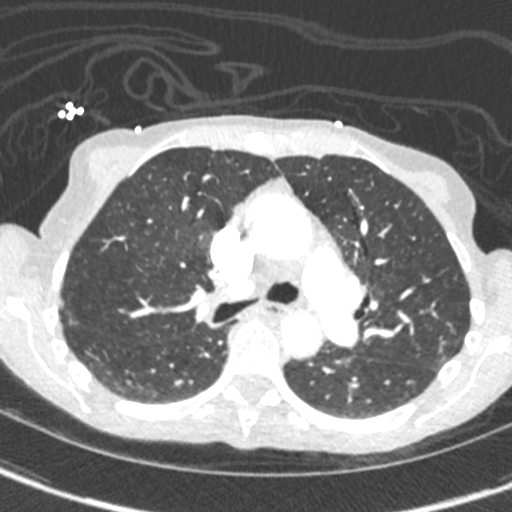
[im 265/397  soft-tissue]
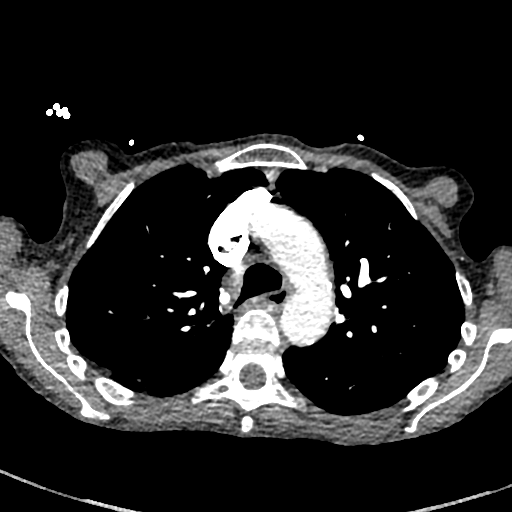
[im 287/397  lung]
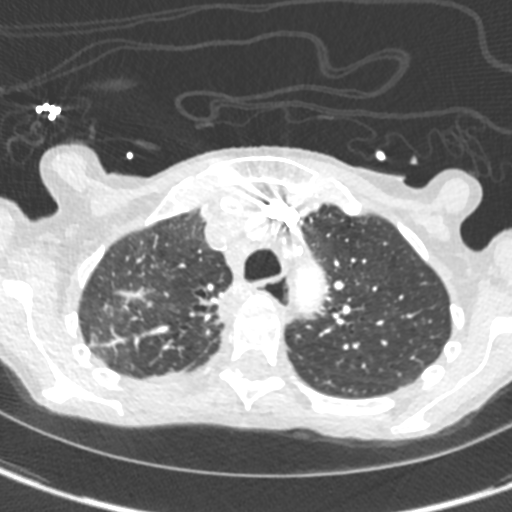
[im 309/397  soft-tissue]
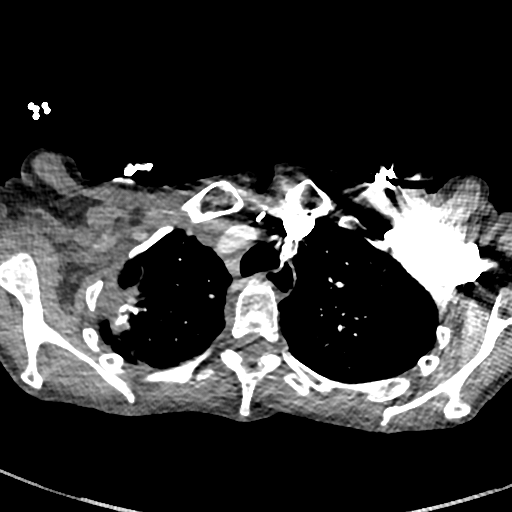
[im 353/397  lung]
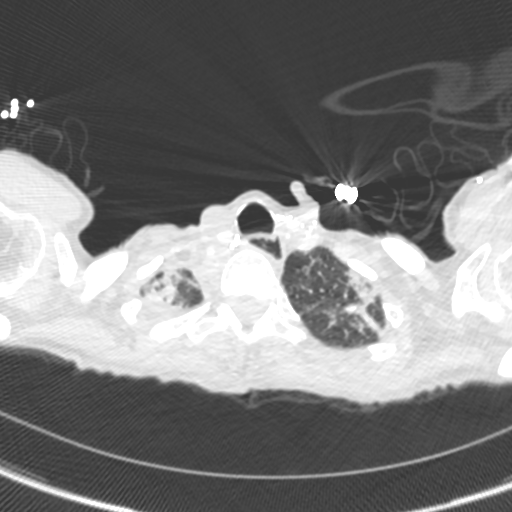
[im 375/397  soft-tissue]
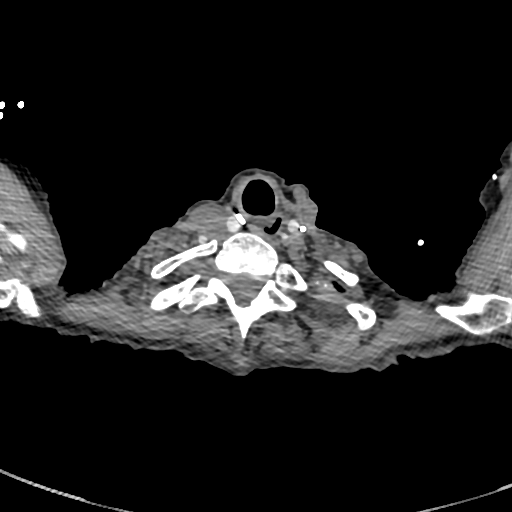

[Series 8: cor · coronal · 0.54mm/px · 3 of 103 slices shown]
[im 26/103  soft-tissue]
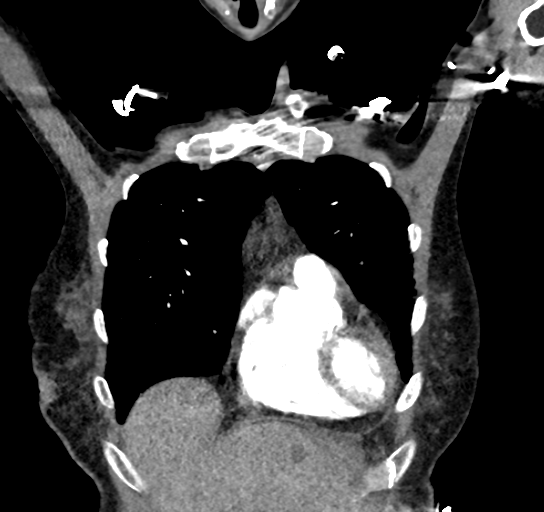
[im 52/103  soft-tissue]
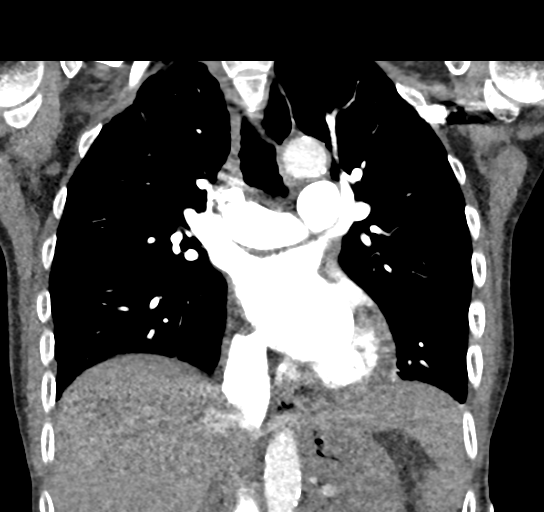
[im 77/103  soft-tissue]
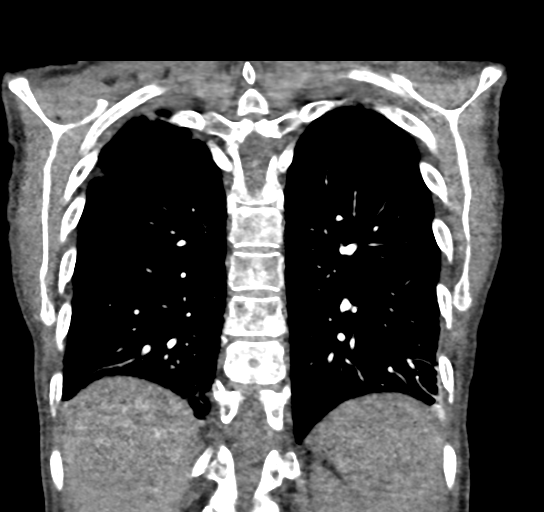

[17 of 46 positions shown; findings below may reference images not displayed]

FINDINGS: Cardiovascular: There is no demonstrable pulmonary embolus. There is
no thoracic aortic aneurysm or dissection. There are foci of
calcification in the proximal visualized great vessels without
hemodynamically significant obstruction evident. There are foci of
aortic atherosclerosis. There are foci of coronary artery
calcification. There is no pericardial effusion or pericardial
thickening.

Mediastinum/Nodes: There is postoperative change in the lower neck
region with evidence of previous thyroidectomy. There is no
appreciable thoracic adenopathy. No esophageal lesions are
appreciable.

Lungs/Pleura: There is fibrosis and cicatrization in the lung apices
bilaterally with areas of pleural calcification in each apex. There
is consolidation in the apical segment of the right upper lobe with
areas of calcification. Elsewhere, there is patchy atelectasis
bilaterally. No airspace consolidation outside of the right apex
noted. No pleural effusion noted.

Upper Abdomen: Visualized upper abdominal structures appear normal
except for mild reflux of contrast into the inferior vena cava and
hepatic veins.

Musculoskeletal: There is a sclerotic bone lesion involving the
posterior aspect of the T3 vertebral body along its leftward aspect.
This area is concerning for a potential sclerotic metastatic focus.
A second sclerotic focus is seen in the superior aspect of the L1
vertebral body, incompletely visualized on this study. There are no
evident lytic or destructive bone lesions. No chest wall lesions
evident. Bone lesions.

Review of the MIP images confirms the above findings.
IMPRESSION: 1. No demonstrable pulmonary embolus. No thoracic aortic aneurysm or
dissection. There are foci of aortic atherosclerosis as well as
great vessel and coronary artery calcifications.

2. Fibrosis and cicatrization in the apices with apical pleural
calcification, likely due to scarring. There is focal airspace
opacity which is asymmetric in the apical segment of the right upper
lobe. This finding may well be due to chronic scarring. A potential
focus of pneumonia or possible neoplastic involvement in this area
must be of concern. This finding may well warrant correlation with
nuclear medicine PET study to assess for abnormal metabolic
activity. At a minimum, a follow-up chest CT in 3 months to assess
for stability of this area would be advised. No other airspace
consolidation noted. Areas of patchy atelectasis noted. No
well-defined nodular type lesions noted in the lung parenchyma.

3. Probable sclerotic bony metastases at C3 and L1. Sclerotic focus
at L1 is incompletely visualized. Medullary thyroid carcinoma as
well as breast carcinoma potentially may present with sclerotic as
opposed to lytic metastases.

4.  No demonstrable thoracic adenopathy.

5. Extensive postoperative change in the thyroid region with
evidence of total thyroidectomy.

6. Mild reflux into the inferior vena cava and hepatic veins
suggests increase in right heart pressure.

Aortic Atherosclerosis ([id number]).

## 2019-05-20 ENCOUNTER — Telehealth: Payer: Self-pay | Admitting: *Deleted

## 2019-05-20 ENCOUNTER — Telehealth: Payer: Self-pay | Admitting: Hematology

## 2019-05-20 NOTE — Telephone Encounter (Signed)
Scheduled appt per 7/20 sch message- unable to reach pt . And unable to leave message . Sent reminder letter in the mail.

## 2019-05-20 NOTE — Telephone Encounter (Signed)
Patient called. States she has seen Dr. Irene Limbo in past (last appt 04/13/2018) and was impressed with his knowledge of her case. Has been going to [facility] for care, but she is older and it is a long drive so she would like to resume care w/Dr.Kale. Dr. Irene Limbo informed and said contact patient and schedule appt. Contacted patient with this information.  Schedule message sent.

## 2019-06-05 NOTE — Progress Notes (Signed)
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 total thyroidectomy in January of 2011 and bilateral neck dissection in June of 2011.  B. Persistent hypercalcitoninemia postoperatively. Calcitonin 499 in 07/2011  C. December 2012: CT of neck/chest was negative for metastases D. She had a normal neck ultrasound in 01/2012 showing her cystic LN was smaller (the one biopsied and negative).  E. FDG PET 08/2012 due to her last calcitonin raising substantially to 739 from the 500s: concerning right cervical lymph node F. 11/08/2012: Level 6 LN dissection. She had 2/2 LN positive, largest 2cm for MTC. Calcitonin one week later from her surgery was down to 515.  G. Calcitonin 592 on 05/24/2013. Repeat ultrasound on that same day which showed 2 at least partially cystic lymph nodes at level 4 which had been previously biopsied and negative; decreased in size since 2012. It also showed a subcentimeter right level VB LN of low suspicion. H. Her metanephrines (plasma) and PTH were normal in 12/2013 Urine studies for pheo were negative in 12/2013. I. 08/04/2014: CEA 9.0; calcitonin 1019 J. 09/05/2014:  - C/A/P CT scan: Interval development of a new calcified nodule in the left lower lobe. - Bone scan: Slight asymmetric increased radiotracer uptake in the right lateral calvarium on the frontal radiograph - Neck U/S: Stable right lateral compartment level 4 and 5B lymph nodes. The cystic right level 4 lymph node, previously biopsied on 01/14/2011 with negative pathology results.  K. 09/23/2014: Thoracoscopy: level 9 LN: Metastatic medullary thyroid carcinoma.  L. 03/11/2015:  CEA 15.9; calcitonin 1149 M 04-13-2015:  - C/A/P CT scan: Previously reported left lower lobe pulmonary nodule is no longer identified. No new or enlarging pulmonary nodules. No evidence of metastatic disease in the abdomen or pelvis. - CT brain:No acute intracranial abnormalities.  New small CSF density subdural fluid collection overlying the right cerebral hemisphere, likely a small hygroma of uncertain etiology. There is no associated bony or parenchymal lesion. 04-13-2015: Bone scan: A non-specific, very small focus of increased tracer activity in the right frontal calvarium is not significantly changed from the prior study. - Neck U/S: There is hypoechoic lesion with questionable microcalcification located in right level IV, it is 0.4 x 0.6 x 0.9 cm. No other pathologic cervical lymphadenopathy seen.  N. 04/13/2015: CEA 14.6; calcitonin 1324 O. 07/16/2015 ([facility]): CEA 19.4 (outsode of [facility]); CAlcitonin 1860. P. 09-07-2015: Neck, Chest , Abd, [place] CT: Stable post-thyroidectomy changes without evidence of recurrent or metastatic disease. Stable pulmonary nodules. Hepatic cysts. No bone mets. CEA: 16; Calcitonin 1533 Q. 03-08-2016: C/A/P CT scan: No definite evidence of metastatic disease in the chest, abdomen or pelvis. Compared to 09/07/2015, there is a new linear 3 mm right lower lobe pulmonary nodule which is indeterminate and may represent focal atelectasis CEA 16.5; CAlcitonin 1839  R. 08-22-2016: Calcitonin: 4596; CEA 27.1  S. 09-06-2016:  - Bone scan: Redemonstrated likely two foci of increased radiotracer uptake within the right calvarium, similar to prior study. - Neck CT: No evidence of recurrent or metastatic disease in the neck. - C/A/P CT: No definite evidence of metastatic disease in the chest, abdomen, or pelvis. - Neck CT: Status post thyroidectomy  and lymph node dissection without evidence of recurrent or metastatic disease.  T. Bone scan: A focus of increased tracer activity  projecting over the posterior right approximate 8th rib is non-specific; this finding may relate to summation of tracer activity in the rib with the inferior tip of the overlying right scapula, post-traumatic change or skeletal metastasis; recommend attention on follow-up. Re-demonstration of subtly increased tracer activity in the right frontal calvarium, stable since 09/05/2014. U. 02-13-2017:  - C/A/P CT scan: nonenlarged mediastinal lymph nodes and soft tissue nodule in the right thyroidectomy bed. Unchanged irregular opacities in the lung apices bilaterally, which may represent treated metastases or scarring.  - Neck CT: Numerous soft tissue enhancing lesions in the superior mediastinum. While these are stable when compared to most recent CT, several of these lesions demonstrate minimal growth from the more remote exam on 09/07/2015. No new enhancing lesions are present. - CEA 25.1, Calcitonin: 5498 V. 08-28-2017: fractionate plasma free metanephrines: Normetanephrine 178 (24hr urine pending as this was thought to be non-specific), Metanephrine: 44 (normal). CEA 46; Calcitonin 9763 W. 09-27-2017: Bone scan: Stable increased radiotracer activity in the right frontal calvarium, indeterminate. No other evidence of osseous metastasis Chest CT/Abd: Mildly enlarged mediastinal lymph nodes are stable. No evidence of metastatic disease in the abdomen   HISTORY OF PRESENTING ILLNESS:   Shannon [name] is a wonderful 78 y.o. female who has been referred to [place] by [place] oncologist Dr. Leslie Andrea for management of Metastatic Medullary Thyroid Cancer. She is looking for a Financial controller in [place].  Today she is accompanied by her sister. Patient notes this started with a nodule in her throat and her endocrinologist started further work up. She has been seeing Med onc at [facility] since her first surgery every 3-6 months.   She has had thyroid surgery and Ldissection but no systemic therapy as  detailed above in the oncologic history.  She notes her family had testing for MEN2A mutation. Her sister, her nephew carry the gene. She notes her maternal aunt had goiters but not sure of the cause.   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 She has had diarrhea since 2010. Her last colonoscopy was in 2010. She has tried multiple treatment options but this continues to worsen over time. She currently takes imodium 4-6 X daily, cholestyramine twice daily. She started monthly Sandostatin on 10/20/17. She fell and broke her hip on 10/23/17 and had surgery same day. She was given hydrocodone which helped her diarrhea and is not sure if the Sandostatin is working for her. She has been on oral iron and she is on aspirin 376m since her hip surgery. She does PT exercises and has been healing well.    She recently stopped caffeine tablets that she was taking for energy. She stopped 3 months ago. She is on Reclast for her osteoporosis, which is managed by her PCP, Dr. KDeatra Ina Pt notes she stopped smoking about 40 years ago and she rarely drinks alcohol.   On review of symptoms, pt notes continued diarrhea, stable energy, weight loss of 6-8 pounds in the last 6 months. Her appetite is adequate. Her diarrhea occurs currently twice a day with "pudding" form to liquid form stool. She can go up to 5-7 times a day at times. She denies cramping with diarrhea. She denies blood, and mucus in stool. Current use of oral iron causes some black stool, which was not present before use.   INTERVAL HISTORY:  Hayla Hinger returns today regarding her Metastatic Medullary Thyroid Cancer. The patient's last visit with [place] was on 04/13/2018. The pt reports that she is doing well overall.  The pt reports that her diarrhea has improved slightly, but is still 3-4x/day. She is managing it with Cholestyramine once daily and Imodium 6x/day. Her endocrinologist, Dr. Dara Lords, left so she decided to  come back here to be monitored by Dr. Irene Limbo.   She has a sinus infection right now for which she is taking Flonase. Denies SOB. She lost a little bit of weight at the end of 2019 but has gained it back. Denies taking any dietary supplements aside from protein drinks.  Of note since the patient's last visit, pt has had CT angiography chest w contrast completed on 10/11/2018 with results revealing "1. No demonstrable pulmonary embolus. No thoracic aortic aneurysm or dissection. There are foci of aortic atherosclerosis as well as great vessel and coronary artery calcifications. 2. Fibrosis and cicatrization in the apices with apical pleural calcification, likely due to scarring. There is focal airspace opacity which is asymmetric in the apical segment of the right upper lobe. This finding may well be due to chronic scarring. A potential focus of pneumonia or possible neoplastic involvement in this area must be of concern. This finding may well warrant correlation with nuclear medicine PET study to assess for abnormal metabolic activity. At a minimum, a follow-up chest CT in 3 months to assess for stability of this area would be advised. No other airspace consolidation noted. Areas of patchy atelectasis noted. No well-defined nodular type lesions noted in the lung parenchyma. 3. Probable sclerotic bony metastases at C3 and L1. Sclerotic focus at L1 is incompletely visualized. Medullary thyroid carcinoma as well as breast carcinoma potentially may present with sclerotic as opposed to lytic metastases. 4.  No demonstrable thoracic adenopathy. 5. Extensive postoperative change in the thyroid region with evidence of total thyroidectomy. 6. Mild reflux into the inferior vena cava and hepatic veins suggests increase in right heart pressure. Aortic Atherosclerosis (ICD10-I70.0)."  She also had a CT chest on 10/17/2018 which revealed "1. Unchanged mediastinal and right supraclavicular lymphadenopathy. Findings are  nonspecific, however metastatic disease is not excluded. 2. Stable biapical scarring and right apical consolidative focus with associated calcification, likely postinfectious in nature."  Most recent lab results from 06/06/2019 of CBC w/diff and CMP is as follows: all values are WNL except for glucose bld at 123, total protein at 6.2. 10/11/2018 troponin at 0.01  On review of systems, pt reports eating better, and denies back pain, belly pain, and any other symptoms.   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 DUCTAL CARCINOMA IN SITU./ RADIATION   CATARACT EXTRACTION W/ INTRAOCULAR LENS IMPLANT  2009   BOTH EYES   FEMUR IM NAIL Left 10/23/2017   Procedure: INTRAMEDULLARY (IM) NAIL FEMORAL;  Surgeon: Paralee Cancel, MD;  Location: [facility];  Service: Orthopedics;  Laterality: Left;   FOOT SURGERY  1070   RIGHT FOOT - PINCHED NERVE   LUNG SURGERY     CALCIFICATIONS, POSITIVE - CANCER LYMPHNODE    MELANOMA EXCISION  2008   CHEST AREA   NECK SURGERY  Nov 09 2011   LYMPHNODES REMOVED 2 POSITIVE    RIGHT COLECTOMY  06/02/2009   THYROIDECTOMY  11/06/2009   TONSILLECTOMY AND ADENOIDECTOMY      SOCIAL HISTORY: Social History   Socioeconomic History   Marital status: Married    Spouse name: Not on file   Number of children: Not on file   Years of education: Not on file   Highest education level: Not on file  Occupational History   Not on file  Social Needs   Financial  resource strain: Not on file   Food insecurity    Worry: Not on file    Inability: Not on file   Transportation needs    Medical: Not on file    Non-medical: Not on file  Tobacco Use   Smoking status: Former Smoker    Quit date: 08/10/1960    Years since quitting: 58.8   Smokeless tobacco: Never Used  Substance and Sexual Activity   Alcohol use: No    Alcohol/week: 0.0 standard drinks   Drug use: No   Sexual activity: Never  Lifestyle   Physical activity    Days per week: Not on file    Minutes per session: Not on file   Stress: Not on file  Relationships   Social connections    Talks on phone: Not on file    Gets together: Not on file    Attends religious service: Not on file    Active member of club or organization: Not on file    Attends meetings of clubs or organizations: Not on file    Relationship status: Not on file   Intimate partner violence    Fear of current or ex partner: Not on file    Emotionally abused: Not on file    Physically abused: Not on file    Forced sexual activity: Not on file  Other Topics Concern   Not on file  Social History Narrative   Not on file    FAMILY HISTORY: Family History  Problem Relation Age of Onset   Cancer Father        throat/ bone   Breast cancer Sister    Cancer Sister        MEDULAR CANCER   Cancer Brother        prostate   Cancer Other        MEDULAR CANCER   Breast cancer Maternal Aunt    Cancer Paternal Aunt        pancreatic    Cancer Cousin        ON MOTHER'S SIDE- LIVER CANCER    ALLERGIES:  is allergic to codeine.  MEDICATIONS:  Current Outpatient Medications  Medication Sig Dispense Refill   ALPRAZolam (XANAX) 1 MG tablet Take 0.5 tablets (0.5 mg total) by mouth 2 (two) times daily. 6 tablet 0   azithromycin (ZITHROMAX) 250 MG tablet Take 1 tablet (250 mg total) by mouth daily. 4 tablet 0   Biotin 1000 MCG tablet Take 1,000 mcg by mouth daily.      caffeine 200 MG TABS  tablet Take 100 mg by mouth every morning.     Calcium Carbonate-Vit D-Min (CALCIUM 600 + MINERALS PO) Take 1  tablet by mouth daily.      cefdinir (OMNICEF) 300 MG capsule Take 1 capsule (300 mg total) by mouth 2 (two) times daily. 14 capsule 0   cetirizine (ZYRTEC) 10 MG tablet Take 10 mg by mouth daily.     Cholecalciferol (VITAMIN D) 1000 UNITS capsule Take 1,000 Units by mouth daily.       cholestyramine (QUESTRAN) 4 g packet Take 4 g by mouth 2 (two) times daily.     Cod Liver Oil 1000 MG CAPS Take 1,000 mg by mouth daily.      Diphenhydramine-APAP, sleep, (TYLENOL PM EXTRA STRENGTH PO) Take 1 tablet by mouth at bedtime.      diphenoxylate-atropine (LOMOTIL) 2.5-0.025 MG tablet Take 1 tablet by mouth 2 (two) times daily as needed for diarrhea or loose stools (refractory diarrhea from medullary thyroid cancer not responding to Loperamide). 60 tablet 0   ferrous sulfate 325 (65 FE) MG tablet Take 1 tablet (325 mg total) by mouth 2 (two) times daily with a meal. (Patient not taking: Reported on 10/11/2018) 60 tablet 3   Glucosamine-Chondroitin 250-200 MG CAPS Take 1 tablet by mouth daily. Name of table is SCHIFF     levothyroxine (SYNTHROID, LEVOTHROID) 112 MCG tablet Take 112 mcg by mouth daily. /125MCG     loperamide (IMODIUM A-D) 2 MG tablet Take 2 mg by mouth 3 (three) times daily.     Multiple Vitamin (MULTIVITAMIN) tablet Take 1 tablet by mouth daily.       oxyCODONE (OXY IR/ROXICODONE) 5 MG immediate release tablet Take 5 mg by mouth every 4 (four) hours as needed for pain.     simethicone (MYLICON) 80 MG chewable tablet Chew 80 mg by mouth 3 (three) times daily.      zoledronic acid (RECLAST) 5 MG/100ML SOLN Inject 5 mg into the vein once.       No current facility-administered medications for this visit.     REVIEW OF SYSTEMS:    A 10+ POINT REVIEW OF SYSTEMS WAS OBTAINED including neurology, dermatology, psychiatry, cardiac, respiratory, lymph, extremities, GI, GU,  Musculoskeletal, constitutional, breasts, reproductive, HEENT.  All pertinent positives are noted in the HPI.  All others are negative.   PHYSICAL EXAMINATION: ECOG PERFORMANCE STATUS: 1 - Symptomatic but completely ambulatory   Vitals:   06/06/19 1519  BP: 135/72  Pulse: 96  Resp: 18  Temp: 98.7 F (37.1 C)  TempSrc: Oral  SpO2: 99%  Weight: 100 lb (45.4 kg)  Height: 5' 3"  (1.6 m)     GENERAL:alert, in no acute distress and comfortable SKIN: no acute rashes, no significant lesions EYES: conjunctiva are pink and non-injected, sclera anicteric, left eye stye OROPHARYNX: MMM, no exudates, no oropharyngeal erythema or ulceration NECK: supple, no JVD LYMPH:  no palpable lymphadenopathy in the cervical, axillary or inguinal regions LUNGS: clear to auscultation b/l with normal respiratory effort HEART: regular rate & rhythm ABDOMEN:  normoactive bowel sounds , non tender, not distended. No palpable hepatosplenomegaly.  Extremity: no pedal edema PSYCH: alert & oriented x 3 with fluent speech NEURO: no focal motor/sensory deficits   LABORATORY DATA:  I have reviewed the data as listed  . CBC Latest Ref Rng & Units 10/11/2018 04/13/2018 01/19/2018  WBC 4.0 - 10.5 K/uL 8.8 4.6 5.3  Hemoglobin 12.0 - 15.0 g/dL 12.8 13.5 13.5  Hematocrit 36.0 - 46.0 % 39.9 40.1 40.6  Platelets 150 - 400 K/uL 221 182 190  hgb 13.5  . CMP Latest Ref Rng & Units  10/11/2018 04/13/2018 01/19/2018  Glucose 70 - 99 mg/dL 123(H) 90 96  BUN 8 - 23 mg/dL 9 17 18   Creatinine 0.44 - 1.00 mg/dL 0.73 0.75 0.78  Sodium 135 - 145 mmol/L 139 143 142  Potassium 3.5 - 5.1 mmol/L 3.8 4.4 4.3  Chloride 98 - 111 mmol/L 102 105 104  CO2 22 - 32 mmol/L 23 29 29   Calcium 8.9 - 10.3 mg/dL 9.4 10.2 10.0  Total Protein 6.5 - 8.1 g/dL 6.2(L) 7.1 6.9  Total Bilirubin 0.3 - 1.2 mg/dL 1.2 0.8 0.6  Alkaline Phos 38 - 126 U/L 68 80 89  AST 15 - 41 U/L 38 25 19  ALT 0 - 44 U/L 36 14 14   02/12/18  Calcitonin:    PATHOLOGY  Lung Biopsy at [facility] 09/23/14 DIAGNOSIS A. Pleural nodule, biopsy:  Calcified and hyalinized remote fat necrosis. No evidence of malignancy.   B. Level 9 lymph node, biopsy:  Metastatic medullary thyroid carcinoma. See comment.  Comment:: A note is made of the patient's history of medullary thyroid carcinoma with documented prior metastatic disease to cervical lymph nodes ([id number]; 04/05/2010).  The prior metastases to cervical lymph nodes are reviewed in conjunction to the current case and the tumors are morphologically similar.   Confirmatory immunohistochemical stains are performed. Tumor cells stain positively for calcitonin and for an anticytokeratin cocktail. The morphologic and immunophenotypic findings support metastatic medullary thyroid carcinoma.   C. Level 8 lymph node, biopsy:  Profiles of lymph nodes, negative for malignancy.   D. Pleural nodule #2, biopsy:  Fibrovascular tissue with extensive cauterization. No evidence of malignancy.        Surgical Pathology at [facility] 11/08/12 Immunohistochemical Findings An immunohistochemical stain for calcitonin obtained on paraffin block A1 is positive.   Diagnosis A. "RIGHT LEVEL 6 LYMPH NODES" (BIOPSY):  METASTATIC MEDULLARY THYROID CARCINOMA IN TWO LYMPH NODES (2/2). SIZE OF LARGEST METASTASIS:2 CM. EXTRANODAL INVASION:PRESENT. Comment:  I certify that I personally conducted the diagnostic evaluation of the above  specimen(s) and have rendered the above diagnosis(es). Rex C. Telford Nab, M.D. Telephone:[phone] Electronically signed: 11/16/12   A. "Right level 6 lymph nodes", received fresh and placed in formalin on 1/9/ two fragments of tan-red soft tissue.Fragment 1 is 1.3 x 1 x 0.6 cm and is bisected and submitted in block A1.Fragment 2 is 2 x 1 x 0.4 cm and is submitted entirely in block  A2.    RADIOGRAPHIC STUDIES: I have personally reviewed the radiological images as listed and agreed with the findings in the report. No results found.   CT Chest w Contrast w 3D MIPS Protocol at [facility] 09/29/17 Impression: 1.Mildly enlarged mediastinal lymph nodes are stable when compared to most recent CT. Although, several nodes are minimally increased from 03/08/2016.  2.Refer to separately dictated abdominal CT report for findings below the diaphragm.  Bone Scan Whole Body at [facility] 09/27/17 Impression: 1. Stable foci of increased radiotracer activity in the right frontal calvarium, indeterminate. 2. No new scintigraphic evidence of osseous metastasis.  US Lymph Node Neck Mapping at [facility] 08/29/17 Impression: 1.  Newly measured right level 5 lymph node (lymph node #5) with abnormal rounded morphology, no echogenic hilum, and microcalcifications. Although biopsy of this nodule may be technically difficult due to its location deep to the carotid vasculature, ultrasound-guided biopsy is recommended (if possible).  2.  Newly measured right level 2A lymph node without suspicious features.  3.  Additional right neck nodes are unchanged.   Bone Density Scan 02/27/17 @  [facility]  Lowest Site Measured: Total Left hip with T-score of -3.1 and BMD of 0.568   CT CAP W Contrast W MIPS at [facility] 02/13/17 Impression: 1. Please see CT neck completed same day for findings in the neck. 2. Stable nonenlarged mediastinal lymph nodes and soft tissue nodule in the right thyroidectomy bed. 3. Unchanged irregular opacities in the lung apices bilaterally, which may represent treated metastases or scarring. No new metastatic disease in the chest, abdomen, or pelvis.  CT Nect Soft Tissue W Contrast at [facility] 02/13/17 IMPRESSION: Numerous soft tissue enhancing lesions in the superior mediastinum. While these are stable when compared to most recent CT, several of these lesions demonstrate minimal growth  from the more remote exam on 09/07/2015. No new enhancing lesions are present.  ASSESSMENT & PLAN:   LOUVINA CLEARY is a 78 y.o. caucasian female with   1. 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  She had a level 6 LN dissection on 11/08/12 with 2/2 LN positive, largest 2cm for MTC.  09/05/14 CAP CT with development of new calcified nodules in the left lower lobe 09/23/14 Lung biopsy showed Level 9 lymph node, positive for malignancy and metastatic cancer which was resected. Treatment for symptomatic disease with vandetanib and cabozantibnib were previously discussed with her 74 Oncologist but the patient had decided to hold off on treatment due to concerns for toxicities and lack of overt cancer related progressive symptoms. Latest scans from 08/2017 are stable, no disease found below chest or in the bones. Her calcitonin levels have been increasing since 2015. Overall pt currently asymptomatic.   CT chest on 10/17/2018 which revealed "1. Unchanged mediastinal and right supraclavicular lymphadenopathy. Findings are nonspecific, however metastatic disease is not excluded. 2. Stable biapical scarring and right apical consolidative focus with associated calcification, likely postinfectious in nature."  10/11/2018 CT angiography chest w contrast revealed "1. No demonstrable pulmonary embolus. No thoracic aortic aneurysm or dissection. There are foci of aortic atherosclerosis as well as great vessel and coronary artery calcifications. 2. Fibrosis and cicatrization in the apices with apical pleural calcification, likely due to scarring. There is focal airspace opacity which is asymmetric in the apical segment of the right upper lobe. This finding may well be due to chronic scarring. A potential focus of pneumonia or possible  neoplastic involvement in this area must be of concern. This finding may well warrant correlation with nuclear medicine PET study to assess for abnormal metabolic activity. At a minimum, a follow-up chest CT in 3 months to assess for stability of this area would be advised. No other airspace consolidation noted. Areas of patchy atelectasis noted. No well-defined nodular type lesions noted in the lung parenchyma. 3. Probable sclerotic bony metastases at C3 and L1. Sclerotic focus at L1 is incompletely visualized. Medullary thyroid carcinoma as well as breast carcinoma potentially may present with sclerotic as opposed to lytic metastases. 4.  No demonstrable thoracic adenopathy. 5. Extensive postoperative change in the thyroid region with evidence of total thyroidectomy. 6. Mild reflux into the inferior vena cava and hepatic veins suggests increase in right heart pressure. Aortic Atherosclerosis (ICD10-I70.0)."  2. 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 -Secondary to hypercalcitoninemia  -Currently on imodium daily, cholestyramine twice daily and started monthly Sandostatin injections on 10/20/17.  -Her last colonoscopy was in 2010  PLAN:  -  CT chest on 10/17/2018 which revealed "1. Unchanged mediastinal and right supraclavicular lymphadenopathy. Findings are nonspecific, however metastatic disease is not excluded. 2. Stable biapical scarring and right apical consolidative focus with associated calcification, likely postinfectious in nature." -No overt clinical or symptomatic progression to indicate the need for complex treatment options such as vandetanib and cabozantibnib -Will continue imodium and cholestyramine for now  -Recommend warm compresses and mild soap for stye -Labs and CT scan in 1 week -Will refer to endocrinology for thyroid replacement management -Will F/U by phone with lab results, then every 6 months  3. Osteoporosis - Last DEXA 02/27/17 with Lowest Site  Measured: Total Left hip with T-score of -3.1 and BMD of 0.568 Closed left hip fracture from fall -s/p surgery on 10/23/17 with rod placement - healing well.  -She takes Reclast injections for her osteoporosis. Managed by her PCP   Labs and CT chest/abd/pelvis in 1 week Phone visit with Dr Irene Limbo in 2 weeks   All of the patients questions were answered with apparent satisfaction. The patient knows to call the clinic with any problems, questions or concerns.  The total time spent in the appt was 25 minutes and more than 50% was on counseling and direct patient cares.  Sullivan Lone MD MS AAHIVMS [facility] Hematology/Oncology Physician [facility]  (Office):       [phone] (Work cell):  [phone] (Fax):           [phone]  06/06/2019 4:07 PM  I, De Burrs, am acting as a scribe for Dr. Irene Limbo  .I have reviewed the above documentation for accuracy and completeness, and I agree with the above. Brunetta Genera MD

## 2019-06-06 ENCOUNTER — Other Ambulatory Visit: Payer: Self-pay

## 2019-06-06 ENCOUNTER — Telehealth: Payer: Self-pay | Admitting: Hematology

## 2019-06-06 ENCOUNTER — Inpatient Hospital Stay: Payer: Medicare Other | Attending: Hematology | Admitting: Hematology

## 2019-06-06 VITALS — BP 135/72 | HR 96 | Temp 98.7°F | Resp 18 | Ht 63.0 in | Wt 100.0 lb

## 2019-06-06 DIAGNOSIS — M810 Age-related osteoporosis without current pathological fracture: Secondary | ICD-10-CM | POA: Diagnosis not present

## 2019-06-06 DIAGNOSIS — Z8719 Personal history of other diseases of the digestive system: Secondary | ICD-10-CM | POA: Insufficient documentation

## 2019-06-06 DIAGNOSIS — C73 Malignant neoplasm of thyroid gland: Secondary | ICD-10-CM | POA: Insufficient documentation

## 2019-06-06 DIAGNOSIS — R197 Diarrhea, unspecified: Secondary | ICD-10-CM | POA: Insufficient documentation

## 2019-06-06 DIAGNOSIS — R634 Abnormal weight loss: Secondary | ICD-10-CM | POA: Insufficient documentation

## 2019-06-06 DIAGNOSIS — R1312 Dysphagia, oropharyngeal phase: Secondary | ICD-10-CM | POA: Diagnosis not present

## 2019-06-06 DIAGNOSIS — K529 Noninfective gastroenteritis and colitis, unspecified: Secondary | ICD-10-CM | POA: Diagnosis not present

## 2019-06-06 DIAGNOSIS — R918 Other nonspecific abnormal finding of lung field: Secondary | ICD-10-CM | POA: Diagnosis not present

## 2019-06-06 NOTE — Telephone Encounter (Signed)
Scheduled appt per 8/6 los.  Printed calendar and avs.  Gave patient contrast and the number to central radiology

## 2019-06-11 ENCOUNTER — Other Ambulatory Visit: Payer: Self-pay | Admitting: Hematology

## 2019-06-11 ENCOUNTER — Telehealth: Payer: Self-pay | Admitting: *Deleted

## 2019-06-11 DIAGNOSIS — E070 Hypersecretion of calcitonin: Secondary | ICD-10-CM

## 2019-06-11 DIAGNOSIS — C73 Malignant neoplasm of thyroid gland: Secondary | ICD-10-CM

## 2019-06-11 NOTE — Telephone Encounter (Signed)
Patient left voice mail stating she had not heard from anyone about the scan that was to be ordered following OV on 8/6. Attempted to contact patient. LVM: The scan has not been authorized by insurance at this time - once authorized, she will be contacted to schedule it. She should keep the lab appt on 8/14 as those labs will be needed for the CT scan. For any questions, please contact the office.

## 2019-06-14 ENCOUNTER — Other Ambulatory Visit: Payer: Self-pay

## 2019-06-14 ENCOUNTER — Inpatient Hospital Stay: Payer: Medicare Other

## 2019-06-14 DIAGNOSIS — C73 Malignant neoplasm of thyroid gland: Secondary | ICD-10-CM

## 2019-06-14 LAB — CMP (CANCER CENTER ONLY)
ALT: 21 U/L (ref 0–44)
AST: 25 U/L (ref 15–41)
Albumin: 4.2 g/dL (ref 3.5–5.0)
Alkaline Phosphatase: 82 U/L (ref 38–126)
Anion gap: 11 (ref 5–15)
BUN: 14 mg/dL (ref 8–23)
CO2: 27 mmol/L (ref 22–32)
Calcium: 9.6 mg/dL (ref 8.9–10.3)
Chloride: 105 mmol/L (ref 98–111)
Creatinine: 0.81 mg/dL (ref 0.44–1.00)
GFR, Est AFR Am: >60 mL/min (ref >60)
GFR, Estimated: >60 mL/min (ref >60)
Glucose, Bld: 84 mg/dL (ref 70–99)
Potassium: 3.9 mmol/L (ref 3.5–5.1)
Sodium: 143 mmol/L (ref 135–145)
Total Bilirubin: 0.8 mg/dL (ref 0.3–1.2)
Total Protein: 7.1 g/dL (ref 6.5–8.1)

## 2019-06-14 LAB — CBC WITH DIFFERENTIAL/PLATELET
Abs Immature Granulocytes: 0.01 10*3/uL (ref 0.00–0.07)
Basophils Absolute: 0.1 10*3/uL (ref 0.0–0.1)
Basophils Relative: 1 %
Eosinophils Absolute: 0.1 10*3/uL (ref 0.0–0.5)
Eosinophils Relative: 2 %
HCT: 40.8 % (ref 36.0–46.0)
Hemoglobin: 13.6 g/dL (ref 12.0–15.0)
Immature Granulocytes: 0 %
Lymphocytes Relative: 13 %
Lymphs Abs: 0.8 10*3/uL (ref 0.7–4.0)
MCH: 29.2 pg (ref 26.0–34.0)
MCHC: 33.3 g/dL (ref 30.0–36.0)
MCV: 87.7 fL (ref 80.0–100.0)
Monocytes Absolute: 0.5 10*3/uL (ref 0.1–1.0)
Monocytes Relative: 9 %
Neutro Abs: 4.6 10*3/uL (ref 1.7–7.7)
Neutrophils Relative %: 75 %
Platelets: 207 10*3/uL (ref 150–400)
RBC: 4.65 MIL/uL (ref 3.87–5.11)
RDW: 13.5 % (ref 11.5–15.5)
WBC: 6.1 10*3/uL (ref 4.0–10.5)
nRBC: 0 % (ref 0.0–0.2)

## 2019-06-14 LAB — T4, FREE: Free T4: 1.2 ng/dL — ABNORMAL HIGH (ref 0.61–1.12)

## 2019-06-14 LAB — CEA (IN HOUSE-CHCC): CEA (CHCC-In House): 39.41 ng/mL — ABNORMAL HIGH (ref 0.00–5.00)

## 2019-06-14 LAB — MAGNESIUM: Magnesium: 1.9 mg/dL (ref 1.7–2.4)

## 2019-06-14 LAB — TSH: TSH: 4.06 u[IU]/mL — ABNORMAL HIGH (ref 0.308–3.960)

## 2019-06-15 LAB — CALCITONIN: Calcitonin: 23524 pg/mL — ABNORMAL HIGH (ref 0.0–5.0)

## 2019-06-18 ENCOUNTER — Ambulatory Visit (HOSPITAL_COMMUNITY)
Admission: RE | Admit: 2019-06-18 | Discharge: 2019-06-18 | Disposition: A | Discharge status: Home / Self Care | Payer: Medicare Other | Source: Ambulatory Visit | Attending: Hematology | Admitting: Hematology

## 2019-06-18 ENCOUNTER — Encounter (HOSPITAL_COMMUNITY): Payer: Self-pay

## 2019-06-18 ENCOUNTER — Other Ambulatory Visit: Payer: Self-pay

## 2019-06-18 DIAGNOSIS — Z853 Personal history of malignant neoplasm of breast: Secondary | ICD-10-CM | POA: Insufficient documentation

## 2019-06-18 DIAGNOSIS — Z808 Family history of malignant neoplasm of other organs or systems: Secondary | ICD-10-CM | POA: Diagnosis not present

## 2019-06-18 DIAGNOSIS — E070 Hypersecretion of calcitonin: Secondary | ICD-10-CM | POA: Diagnosis present

## 2019-06-18 DIAGNOSIS — Z79899 Other long term (current) drug therapy: Secondary | ICD-10-CM | POA: Insufficient documentation

## 2019-06-18 DIAGNOSIS — C73 Malignant neoplasm of thyroid gland: Secondary | ICD-10-CM | POA: Insufficient documentation

## 2019-06-18 DIAGNOSIS — Z8042 Family history of malignant neoplasm of prostate: Secondary | ICD-10-CM | POA: Insufficient documentation

## 2019-06-18 DIAGNOSIS — Z85118 Personal history of other malignant neoplasm of bronchus and lung: Secondary | ICD-10-CM | POA: Insufficient documentation

## 2019-06-18 DIAGNOSIS — Z87891 Personal history of nicotine dependence: Secondary | ICD-10-CM | POA: Insufficient documentation

## 2019-06-18 DIAGNOSIS — M899 Disorder of bone, unspecified: Secondary | ICD-10-CM | POA: Diagnosis not present

## 2019-06-18 DIAGNOSIS — R197 Diarrhea, unspecified: Secondary | ICD-10-CM | POA: Diagnosis not present

## 2019-06-18 MED ORDER — SODIUM CHLORIDE (PF) 0.9 % IJ SOLN
INTRAMUSCULAR | Status: AC
  Filled 2019-06-18: qty 50

## 2019-06-18 MED ORDER — IOHEXOL 300 MG/ML  SOLN
30.0000 mL | Freq: Once | INTRAMUSCULAR | Status: AC | PRN
  Administered 2019-06-18: 30 mL via ORAL

## 2019-06-18 MED ORDER — IOHEXOL 300 MG/ML  SOLN
75.0000 mL | Freq: Once | INTRAMUSCULAR | Status: AC | PRN
  Administered 2019-06-18: 75 mL via INTRAVENOUS

## 2019-06-21 ENCOUNTER — Inpatient Hospital Stay (HOSPITAL_BASED_OUTPATIENT_CLINIC_OR_DEPARTMENT_OTHER): Payer: Medicare Other | Admitting: Hematology

## 2019-06-21 ENCOUNTER — Other Ambulatory Visit: Payer: Self-pay

## 2019-06-21 VITALS — BP 142/69 | HR 93 | Temp 98.0°F | Resp 20 | Ht 63.0 in | Wt 99.0 lb

## 2019-06-21 DIAGNOSIS — C73 Malignant neoplasm of thyroid gland: Secondary | ICD-10-CM

## 2019-06-21 NOTE — Progress Notes (Signed)
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 total thyroidectomy in January of 2011 and bilateral neck dissection in June of 2011.  B. Persistent hypercalcitoninemia postoperatively. Calcitonin 499 in 07/2011  C. December 2012: CT of neck/chest was negative for metastases D. She had a normal neck ultrasound in 01/2012 showing her cystic LN was smaller (the one biopsied and negative).  E. FDG PET 08/2012 due to her last calcitonin raising substantially to 739 from the 500s: concerning right cervical lymph node F. 11/08/2012: Level 6 LN dissection. She had 2/2 LN positive, largest 2cm for MTC. Calcitonin one week later from her surgery was down to 515.  G. Calcitonin 592 on 05/24/2013. Repeat ultrasound on that same day which showed 2 at least partially cystic lymph nodes at level 4 which had been previously biopsied and negative; decreased in size since 2012. It also showed a subcentimeter right level VB LN of low suspicion. H. Her metanephrines (plasma) and PTH were normal in 12/2013 Urine studies for pheo were negative in 12/2013. I. 08/04/2014: CEA 9.0; calcitonin 1019 J. 09/05/2014:  - C/A/P CT scan: Interval development of a new calcified nodule in the left lower lobe. - Bone scan: Slight asymmetric increased radiotracer uptake in the right lateral calvarium on the frontal radiograph - Neck U/S: Stable right lateral compartment level 4 and 5B lymph nodes. The cystic right level 4 lymph node, previously biopsied on 01/14/2011 with negative pathology results.  K. 09/23/2014: Thoracoscopy: level 9 LN: Metastatic medullary thyroid carcinoma.  L. 03/11/2015:  CEA 15.9; calcitonin 1149 M 04-13-2015:  - C/A/P CT scan: Previously reported left lower lobe pulmonary nodule is no longer identified. No new or enlarging pulmonary nodules. No evidence of metastatic disease in the abdomen or pelvis. - CT brain:No acute intracranial abnormalities.  New small CSF density subdural fluid collection overlying the right cerebral hemisphere, likely a small hygroma of uncertain etiology. There is no associated bony or parenchymal lesion. 04-13-2015: Bone scan: A non-specific, very small focus of increased tracer activity in the right frontal calvarium is not significantly changed from the prior study. - Neck U/S: There is hypoechoic lesion with questionable microcalcification located in right level IV, it is 0.4 x 0.6 x 0.9 cm. No other pathologic cervical lymphadenopathy seen.  N. 04/13/2015: CEA 14.6; calcitonin 1324 O. 07/16/2015 ([facility]): CEA 19.4 (outsode of [facility]); CAlcitonin 1860. P. 09-07-2015: Neck, Chest , Abd, [place] CT: Stable post-thyroidectomy changes without evidence of recurrent or metastatic disease. Stable pulmonary nodules. Hepatic cysts. No bone mets. CEA: 16; Calcitonin 1533 Q. 03-08-2016: C/A/P CT scan: No definite evidence of metastatic disease in the chest, abdomen or pelvis. Compared to 09/07/2015, there is a new linear 3 mm right lower lobe pulmonary nodule which is indeterminate and may represent focal atelectasis CEA 16.5; CAlcitonin 1839  R. 08-22-2016: Calcitonin: 4596; CEA 27.1  S. 09-06-2016:  - Bone scan: Redemonstrated likely two foci of increased radiotracer uptake within the right calvarium, similar to prior study. - Neck CT: No evidence of recurrent or metastatic disease in the neck. - C/A/P CT: No definite evidence of metastatic disease in the chest, abdomen, or pelvis. - Neck CT: Status post thyroidectomy  and lymph node dissection without evidence of recurrent or metastatic disease.  T. Bone scan: A focus of increased tracer activity  projecting over the posterior right approximate 8th rib is non-specific; this finding may relate to summation of tracer activity in the rib with the inferior tip of the overlying right scapula, post-traumatic change or skeletal metastasis; recommend attention on follow-up. Re-demonstration of subtly increased tracer activity in the right frontal calvarium, stable since 09/05/2014. U. 02-13-2017:  - C/A/P CT scan: nonenlarged mediastinal lymph nodes and soft tissue nodule in the right thyroidectomy bed. Unchanged irregular opacities in the lung apices bilaterally, which may represent treated metastases or scarring.  - Neck CT: Numerous soft tissue enhancing lesions in the superior mediastinum. While these are stable when compared to most recent CT, several of these lesions demonstrate minimal growth from the more remote exam on 09/07/2015. No new enhancing lesions are present. - CEA 25.1, Calcitonin: 5498 V. 08-28-2017: fractionate plasma free metanephrines: Normetanephrine 178 (24hr urine pending as this was thought to be non-specific), Metanephrine: 44 (normal). CEA 46; Calcitonin 9763 W. 09-27-2017: Bone scan: Stable increased radiotracer activity in the right frontal calvarium, indeterminate. No other evidence of osseous metastasis Chest CT/Abd: Mildly enlarged mediastinal lymph nodes are stable. No evidence of metastatic disease in the abdomen   HISTORY OF PRESENTING ILLNESS:   Shannon [name] is a wonderful 78 y.o. female who has been referred to [place] by [place] oncologist Dr. Leslie Andrea for management of Metastatic Medullary Thyroid Cancer. She is looking for a Financial controller in [place].  Today she is accompanied by her sister. Patient notes this started with a nodule in her throat and her endocrinologist started further work up. She has been seeing Med onc at [facility] since her first surgery every 3-6 months.   She has had thyroid surgery and Ldissection but no systemic therapy as  detailed above in the oncologic history.  She notes her family had testing for MEN2A mutation. Her sister, her nephew carry the gene. She notes her maternal aunt had goiters but not sure of the cause.   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 She has had diarrhea since 2010. Her last colonoscopy was in 2010. She has tried multiple treatment options but this continues to worsen over time. She currently takes imodium 4-6 X daily, cholestyramine twice daily. She started monthly Sandostatin on 10/20/17. She fell and broke her hip on 10/23/17 and had surgery same day. She was given hydrocodone which helped her diarrhea and is not sure if the Sandostatin is working for her. She has been on oral iron and she is on aspirin 385m since her hip surgery. She does PT exercises and has been healing well.    She recently stopped caffeine tablets that she was taking for energy. She stopped 3 months ago. She is on Reclast for her osteoporosis, which is managed by her PCP, Dr. KDeatra Ina Pt notes she stopped smoking about 40 years ago and she rarely drinks alcohol.   On review of symptoms, pt notes continued diarrhea, stable energy, weight loss of 6-8 pounds in the last 6 months. Her appetite is adequate. Her diarrhea occurs currently twice a day with "pudding" form to liquid form stool. She can go up to 5-7 times a day at times. She denies cramping with diarrhea. She denies blood, and mucus in stool. Current use of oral iron causes some black stool, which was not present before use.   INTERVAL HISTORY:  Charli Halle returns today regarding her Metastatic Medullary Thyroid Cancer. The patient's last visit with [place] was on 06/06/2019.The pt reports that she is doing well overall.  The pt reports that she is still taking her Reclast shots with her PCP for mx of osteoporosis. Her diarrhea is manageable at this time. Pt reports that she has been taking her Vitamin-D. She reports fluid  behind her ears, dry mouth and no excessive congestion.   Of note since the patient's last visit, pt has had a Chest/Abd/Pelvis CT Scan completed on 06/18/2019 with results revealing " 1. No acute findings within the abdomen or pelvis. 2. Scattered sclerotic bone lesions are identified within the thoracic, lumbar spine and bony pelvis. Suspicious for bone metastases. These sclerotic lesions noted on the previously image CT of the chest are stable. No prior imaging available for comparison of lower lumbar spine and bony pelvis sclerotic lesions. 3. No soft tissue mass or adenopathy identified. 4. Similar appearance of extensive biapical pleuroparenchyma scarring, right greater than left. Partially calcified area of masslike architectural distortion in the right apex is unchanged."   Lab results today (06/14/19) of CBC w/diff and CMP is as follows: all values are WNL. 06/14/2019 Calcitonin is 23,524. 06/14/2019 CEA is 39.41. 06/14/2019 Magnesium is 1.9 06/14/2019 T4, free is 1.20 06/14/2019 TSH is 4.060  On review of systems, pt reports diarrhea, trouble swallowing, sinus issues, dry mouth and denies large lumps/bumps in her neck, congestion, abdominal pain, leg swelling and any other symptoms.    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 DUCTAL CARCINOMA IN SITU./ RADIATION   CATARACT EXTRACTION W/ INTRAOCULAR LENS IMPLANT  2009   BOTH EYES   FEMUR IM NAIL Left 10/23/2017   Procedure:  INTRAMEDULLARY (IM) NAIL FEMORAL;  Surgeon: Paralee Cancel, MD;  Location: [facility];  Service: Orthopedics;  Laterality: Left;   FOOT SURGERY  1070   RIGHT FOOT - PINCHED NERVE   LUNG SURGERY     CALCIFICATIONS, POSITIVE - CANCER LYMPHNODE    MELANOMA EXCISION  2008   CHEST AREA   NECK SURGERY  Nov 09 2011   LYMPHNODES REMOVED 2 POSITIVE    RIGHT COLECTOMY  06/02/2009   THYROIDECTOMY  11/06/2009   TONSILLECTOMY AND ADENOIDECTOMY      SOCIAL HISTORY: Social History   Socioeconomic History   Marital status: Married    Spouse name: Not on file   Number of children: Not on file   Years of education: Not on file   Highest education level: Not on file  Occupational History   Not on file  Social Needs   Financial resource strain: Not on file   Food insecurity    Worry: Not on file    Inability: Not on file   Transportation needs    Medical: Not on file    Non-medical: Not on file  Tobacco Use   Smoking status: Former Smoker  Quit date: 08/10/1960    Years since quitting: 58.9   Smokeless tobacco: Never Used  Substance and Sexual Activity   Alcohol use: No    Alcohol/week: 0.0 standard drinks   Drug use: No   Sexual activity: Never  Lifestyle   Physical activity    Days per week: Not on file    Minutes per session: Not on file   Stress: Not on file  Relationships   Social connections    Talks on phone: Not on file    Gets together: Not on file    Attends religious service: Not on file    Active member of club or organization: Not on file    Attends meetings of clubs or organizations: Not on file    Relationship status: Not on file   Intimate partner violence    Fear of current or ex partner: Not on file    Emotionally abused: Not on file    Physically abused: Not on file    Forced sexual activity: Not on file  Other Topics Concern   Not on file  Social History Narrative   Not on file    FAMILY HISTORY: Family History  Problem Relation  Age of Onset   Cancer Father        throat/ bone   Breast cancer Sister    Cancer Sister        MEDULAR CANCER   Cancer Brother        prostate   Cancer Other        MEDULAR CANCER   Breast cancer Maternal Aunt    Cancer Paternal Aunt        pancreatic    Cancer Cousin        ON MOTHER'S SIDE- LIVER CANCER    ALLERGIES:  is allergic to codeine.  MEDICATIONS:  Current Outpatient Medications  Medication Sig Dispense Refill   ALPRAZolam (XANAX) 1 MG tablet Take 0.5 tablets (0.5 mg total) by mouth 2 (two) times daily. 6 tablet 0   Biotin 1000 MCG tablet Take 1,000 mcg by mouth daily.      caffeine 200 MG TABS tablet Take 100 mg by mouth every morning.     Calcium Carbonate-Vit D-Min (CALCIUM 600 + MINERALS PO) Take 1 tablet by mouth daily.      cetirizine (ZYRTEC) 10 MG tablet Take 10 mg by mouth daily.     Cholecalciferol (VITAMIN D) 1000 UNITS capsule Take 1,000 Units by mouth daily.       cholestyramine (QUESTRAN) 4 g packet Take 4 g by mouth 2 (two) times daily.     Cod Liver Oil 1000 MG CAPS Take 1,000 mg by mouth daily.      Glucosamine-Chondroitin 250-200 MG CAPS Take 1 tablet by mouth daily. Name of table is SCHIFF     levothyroxine (SYNTHROID, LEVOTHROID) 112 MCG tablet Take 112 mcg by mouth daily. /125MCG     loperamide (IMODIUM A-D) 2 MG tablet Take 2 mg by mouth 3 (three) times daily.     Multiple Vitamin (MULTIVITAMIN) tablet Take 1 tablet by mouth daily.       oxyCODONE (OXY IR/ROXICODONE) 5 MG immediate release tablet Take 5 mg by mouth every 4 (four) hours as needed for pain.     simethicone (MYLICON) 80 MG chewable tablet Chew 80 mg by mouth 3 (three) times daily.      zoledronic acid (RECLAST) 5 MG/100ML SOLN Inject 5 mg into the vein once.  No current facility-administered medications for this visit.     REVIEW OF SYSTEMS:    A 10+ POINT REVIEW OF SYSTEMS WAS OBTAINED including neurology, dermatology, psychiatry, cardiac,  respiratory, lymph, extremities, GI, GU, Musculoskeletal, constitutional, breasts, reproductive, HEENT.  All pertinent positives are noted in the HPI.  All others are negative.   PHYSICAL EXAMINATION: ECOG PERFORMANCE STATUS: 1 - Symptomatic but completely ambulatory   Vitals:   06/21/19 1156  BP: (!) 142/69  Pulse: 93  Resp: 20  Temp: 98 F (36.7 C)  TempSrc: Oral  SpO2: 100%  Weight: 99 lb (44.9 kg)  Height: 5' 3"  (1.6 m)     GENERAL:alert, in no acute distress and comfortable SKIN: no acute rashes, no significant lesions EYES: conjunctiva are pink and non-injected, sclera anicteric, left eye stye OROPHARYNX: MMM, no exudates, no oropharyngeal erythema or ulceration. oropharyngeal dysphasia.   NECK: supple, no JVD LYMPH:  no palpable lymphadenopathy in the cervical, axillary or inguinal regions LUNGS: clear to auscultation b/l with normal respiratory effort HEART: regular rate & rhythm ABDOMEN:  normoactive bowel sounds , non tender, not distended. No palpable hepatosplenomegaly.  Extremity: no pedal edema PSYCH: alert & oriented x 3 with fluent speech NEURO: no focal motor/sensory deficits   LABORATORY DATA:  I have reviewed the data as listed  . CBC Latest Ref Rng & Units 06/14/2019 10/11/2018 04/13/2018  WBC 4.0 - 10.5 K/uL 6.1 8.8 4.6  Hemoglobin 12.0 - 15.0 g/dL 13.6 12.8 13.5  Hematocrit 36.0 - 46.0 % 40.8 39.9 40.1  Platelets 150 - 400 K/uL 207 221 182  hgb 13.5  . CMP Latest Ref Rng & Units 06/14/2019 10/11/2018 04/13/2018  Glucose 70 - 99 mg/dL 84 123(H) 90  BUN 8 - 23 mg/dL 14 9 17   Creatinine 0.44 - 1.00 mg/dL 0.81 0.73 0.75  Sodium 135 - 145 mmol/L 143 139 143  Potassium 3.5 - 5.1 mmol/L 3.9 3.8 4.4  Chloride 98 - 111 mmol/L 105 102 105  CO2 22 - 32 mmol/L 27 23 29   Calcium 8.9 - 10.3 mg/dL 9.6 9.4 10.2  Total Protein 6.5 - 8.1 g/dL 7.1 6.2(L) 7.1  Total Bilirubin 0.3 - 1.2 mg/dL 0.8 1.2 0.8  Alkaline Phos 38 - 126 U/L 82 68 80  AST 15 - 41 U/L 25 38  25  ALT 0 - 44 U/L 21 36 14   02/12/18 Calcitonin:     PATHOLOGY  [facility] 09/23/14 DIAGNOSIS A. Pleural nodule, biopsy:  Calcified and hyalinized remote fat necrosis. No evidence of malignancy.   B. Level 9 lymph node, biopsy:  Metastatic medullary thyroid carcinoma. See comment.  Comment:: A note is made of the patient's history of medullary thyroid carcinoma with documented prior metastatic disease to cervical lymph nodes ([id number]; 04/05/2010).  The prior metastases to cervical lymph nodes are reviewed in conjunction to the current case and the tumors are morphologically similar.   Confirmatory immunohistochemical stains are performed. Tumor cells stain positively for calcitonin and for an anticytokeratin cocktail. The morphologic and immunophenotypic findings support metastatic medullary thyroid carcinoma.   C. Level 8 lymph node, biopsy:  Profiles of lymph nodes, negative for malignancy.   D. Pleural nodule #2, biopsy:  Fibrovascular tissue with extensive cauterization. No evidence of malignancy.        Surgical Pathology at [facility] 11/08/12 Immunohistochemical Findings An immunohistochemical stain for calcitonin obtained on paraffin block A1 is positive.   Diagnosis A. "RIGHT LEVEL 6 LYMPH NODES" (BIOPSY):  METASTATIC MEDULLARY  THYROID CARCINOMA IN TWO LYMPH NODES (2/2). SIZE OF LARGEST METASTASIS:2 CM. EXTRANODAL INVASION:PRESENT. Comment:  I certify that I personally conducted the diagnostic evaluation of the above  specimen(s) and have rendered the above diagnosis(es). Rex C. Telford Nab, M.D. [phone] Electronically signed: 11/16/12   A. "Right level 6 lymph nodes", received fresh and placed in formalin on 1/9/ two fragments of tan-red soft tissue.Fragment 1 is 1.3 x 1 x 0.6 cm and is bisected and submitted in block A1.Fragment 2 is 2 x 1 x 0.4 cm and  is submitted entirely in block A2.    RADIOGRAPHIC STUDIES: I have personally reviewed the radiological images as listed and agreed with the findings in the report. Ct Chest W Contrast  Result Date: 06/18/2019 CLINICAL DATA:  Medullary thyroid cancer. Increasing calcitonin levels. Evaluate for disease progression. EXAM: CT CHEST, ABDOMEN, AND PELVIS WITH CONTRAST TECHNIQUE: Multidetector CT imaging of the chest, abdomen and pelvis was performed following the standard protocol during bolus administration of intravenous contrast. CONTRAST:  75m OMNIPAQUE IOHEXOL 300 MG/ML  SOLN COMPARISON:  CT chest 10/11/2018. FINDINGS: CT CHEST FINDINGS Cardiovascular: The heart size appears normal. Aortic atherosclerosis. Calcification in the LAD, RCA and left circumflex coronary artery. Mediastinum/Nodes: Status post thyroidectomy. Insert esophagus The trachea appears patent and is midline. Partially calcified mediastinal and hilar lymph nodes are again noted. Similar to previous exam. Index right paratracheal lymph node measures 1 cm, image 27/2. Index subcarinal lymph node measures 1.2 cm, image 32/2. Unchanged. Lungs/Pleura: No pleural effusion identified. Extensive biapical pleuroparenchymal scarring. Masslike architectural distortion with calcifications identified within the right upper lobe laterally, unchanged. Stable left apex scarring. Musculoskeletal: Unchanged sclerotic lesions involving the cervical and thoracic spine including lesion involving T3 and T8. No new or progressive bone metastases. CT ABDOMEN PELVIS FINDINGS Hepatobiliary: Unchanged appearance of liver cysts. No suspicious liver lesion. Gallbladder is unremarkable. No biliary dilatation. Pancreas: Unremarkable. No pancreatic ductal dilatation or surrounding inflammatory changes. Spleen: 1 cm low-density structure within the spleen identified. Indeterminate. Spleen otherwise unremarkable and normal in size. Adrenals/Urinary Tract: Normal appearance  of the adrenal glands. Kidneys are unremarkable. No mass or hydronephrosis. Urinary bladder appears normal. Stomach/Bowel: Stomach is within normal limits. No evidence of bowel wall thickening, distention, or inflammatory changes. Vascular/Lymphatic: Aortic atherosclerosis. No aneurysm. No abdominopelvic adenopathy identified. No inguinal adenopathy. Reproductive: Status post hysterectomy. No adnexal masses. Other: No free fluid or fluid collections. Musculoskeletal: Sclerotic lesion within the L1 vertebra is unchanged measuring 9 mm, image 60/2. Previous ORIF of the proximal left femur. Two small sclerotic foci within the left sacral wing and left iliac bone are noted, nonspecific. Additional small sclerotic lesions are noted scattered throughout the lumbar spine the measuring up to 5 mm. Not imaged previously. IMPRESSION: 1. No acute findings within the abdomen or pelvis. 2. Scattered sclerotic bone lesions are identified within the thoracic, lumbar spine and bony pelvis. Suspicious for bone metastases. These sclerotic lesions noted on the previously image CT of the chest are stable. No prior imaging available for comparison of lower lumbar spine and bony pelvis sclerotic lesions. 3. No soft tissue mass or adenopathy identified. 4. Similar appearance of extensive biapical pleuroparenchymal scarring, right greater than left. Partially calcified area of masslike architectural distortion in the right apex is unchanged. Electronically Signed   By: TKerby MoorsM.D.   On: 06/18/2019 13:02   Ct Abdomen Pelvis W Contrast  Result Date: 06/18/2019 CLINICAL DATA:  Medullary thyroid cancer. Increasing calcitonin levels. Evaluate for disease progression. EXAM: CT CHEST,  ABDOMEN, AND PELVIS WITH CONTRAST TECHNIQUE: Multidetector CT imaging of the chest, abdomen and pelvis was performed following the standard protocol during bolus administration of intravenous contrast. CONTRAST:  29m OMNIPAQUE IOHEXOL 300 MG/ML  SOLN  COMPARISON:  CT chest 10/11/2018. FINDINGS: CT CHEST FINDINGS Cardiovascular: The heart size appears normal. Aortic atherosclerosis. Calcification in the LAD, RCA and left circumflex coronary artery. Mediastinum/Nodes: Status post thyroidectomy. Insert esophagus The trachea appears patent and is midline. Partially calcified mediastinal and hilar lymph nodes are again noted. Similar to previous exam. Index right paratracheal lymph node measures 1 cm, image 27/2. Index subcarinal lymph node measures 1.2 cm, image 32/2. Unchanged. Lungs/Pleura: No pleural effusion identified. Extensive biapical pleuroparenchymal scarring. Masslike architectural distortion with calcifications identified within the right upper lobe laterally, unchanged. Stable left apex scarring. Musculoskeletal: Unchanged sclerotic lesions involving the cervical and thoracic spine including lesion involving T3 and T8. No new or progressive bone metastases. CT ABDOMEN PELVIS FINDINGS Hepatobiliary: Unchanged appearance of liver cysts. No suspicious liver lesion. Gallbladder is unremarkable. No biliary dilatation. Pancreas: Unremarkable. No pancreatic ductal dilatation or surrounding inflammatory changes. Spleen: 1 cm low-density structure within the spleen identified. Indeterminate. Spleen otherwise unremarkable and normal in size. Adrenals/Urinary Tract: Normal appearance of the adrenal glands. Kidneys are unremarkable. No mass or hydronephrosis. Urinary bladder appears normal. Stomach/Bowel: Stomach is within normal limits. No evidence of bowel wall thickening, distention, or inflammatory changes. Vascular/Lymphatic: Aortic atherosclerosis. No aneurysm. No abdominopelvic adenopathy identified. No inguinal adenopathy. Reproductive: Status post hysterectomy. No adnexal masses. Other: No free fluid or fluid collections. Musculoskeletal: Sclerotic lesion within the L1 vertebra is unchanged measuring 9 mm, image 60/2. Previous ORIF of the proximal left  femur. Two small sclerotic foci within the left sacral wing and left iliac bone are noted, nonspecific. Additional small sclerotic lesions are noted scattered throughout the lumbar spine the measuring up to 5 mm. Not imaged previously. IMPRESSION: 1. No acute findings within the abdomen or pelvis. 2. Scattered sclerotic bone lesions are identified within the thoracic, lumbar spine and bony pelvis. Suspicious for bone metastases. These sclerotic lesions noted on the previously image CT of the chest are stable. No prior imaging available for comparison of lower lumbar spine and bony pelvis sclerotic lesions. 3. No soft tissue mass or adenopathy identified. 4. Similar appearance of extensive biapical pleuroparenchymal scarring, right greater than left. Partially calcified area of masslike architectural distortion in the right apex is unchanged. Electronically Signed   By: TKerby MoorsM.D.   On: 06/18/2019 13:02     CT Chest w Contrast w 3D MIPS Protocol at [facility]: 1.Mildly enlarged mediastinal lymph nodes are stable when compared to most recent CT. Although, several nodes are minimally increased from 03/08/2016.  2.Refer to separately dictated abdominal CT report for findings below the diaphragm.  [facility] Impression: 1. Stable foci of increased radiotracer activity in the right frontal calvarium, indeterminate. 2. No new scintigraphic evidence of osseous metastasis.  [facility] Impression: 1.  Newly measured right level 5 lymph node (lymph node #5) with abnormal rounded morphology, no echogenic hilum, and microcalcifications. Although biopsy of this nodule may be technically difficult due to its location deep to the carotid vasculature, ultrasound-guided biopsy is recommended (if possible).  2.  Newly measured right level 2A lymph node without suspicious features.  3.  Additional right neck nodes are  unchanged.   Bone Density Scan 02/27/17 @ [facility]  Lowest Site Measured:  Total Left hip with T-score of -3.1 and BMD of 0.568   CT CAP W Contrast W MIPS at [facility] 02/13/17 Impression: 1. Please see CT neck completed same day for findings in the neck. 2. Stable nonenlarged mediastinal lymph nodes and soft tissue nodule in the right thyroidectomy bed. 3. Unchanged irregular opacities in the lung apices bilaterally, which may represent treated metastases or scarring. No new metastatic disease in the chest, abdomen, or pelvis.  CT Nect Soft Tissue W Contrast at [facility] 02/13/17 IMPRESSION: Numerous soft tissue enhancing lesions in the superior mediastinum. While these are stable when compared to most recent CT, several of these lesions demonstrate minimal growth from the more remote exam on 09/07/2015. No new enhancing lesions are present.  ASSESSMENT & PLAN:   Shannon [name] is a 78 y.o. caucasian female with   1. 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  She had a level 6 LN dissection on 11/08/12 with 2/2 LN positive, largest 2cm for MTC.  09/05/14 CAP CT with development of new calcified nodules in the left lower lobe 09/23/14 Lung biopsy showed Level 9 lymph node, positive for malignancy and metastatic cancer which was resected. Treatment for symptomatic disease with vandetanib and cabozantibnib were previously discussed with her 22 Oncologist but the patient had decided to hold off on treatment due to concerns for toxicities and lack of overt cancer related progressive symptoms. Latest scans from 08/2017 are stable, no disease found below chest or in the bones. Her calcitonin levels have been increasing since 2015. Overall pt currently asymptomatic.   CT chest on 10/17/2018 which revealed "1. Unchanged mediastinal and  right supraclavicular lymphadenopathy. Findings are nonspecific, however metastatic disease is not excluded. 2. Stable biapical scarring and right apical consolidative focus with associated calcification, likely postinfectious in nature."  10/11/2018 CT angiography chest w contrast revealed "1. No demonstrable pulmonary embolus. No thoracic aortic aneurysm or dissection. There are foci of aortic atherosclerosis as well as great vessel and coronary artery calcifications. 2. Fibrosis and cicatrization in the apices with apical pleural calcification, likely due to scarring. There is focal airspace opacity which is asymmetric in the apical segment of the right upper lobe. This finding may well be due to chronic scarring. A potential focus of pneumonia or possible neoplastic involvement in this area must be of concern. This finding may well warrant correlation with nuclear medicine PET study to assess for abnormal metabolic activity. At a minimum, a follow-up chest CT in 3 months to assess for stability of this area would be advised. No other airspace consolidation noted. Areas of patchy atelectasis noted. No well-defined nodular type lesions noted in the lung parenchyma. 3. Probable sclerotic bony metastases at C3 and L1. Sclerotic focus at L1 is incompletely visualized. Medullary thyroid carcinoma as well as breast carcinoma potentially may present with sclerotic as opposed to lytic metastases. 4.  No demonstrable thoracic adenopathy. 5. Extensive postoperative change in the thyroid region with evidence of total thyroidectomy. 6. Mild reflux into the inferior vena cava and hepatic veins suggests increase in right heart pressure. Aortic Atherosclerosis (ICD10-I70.0)."  2. 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 -Secondary to hypercalcitoninemia  -Currently on imodium daily, cholestyramine twice daily  -was previously on monthly Sandostatin injections in 10/20/17- but patient decided  to hold citing no real benefit from adding this -Her  last colonoscopy was in 2010  PLAN:   -Discussed pt labwork today, 06/14/19; blood counts & chemistries are normal.  -Discussed 06/14/2019 TSH is lstable -Discussed 06/14/2019 CEA - increasing at this time -Discussed 06/14/2019 Calcitonin - increasing at this time -Discussed 06/18/2019 CT scan shows no notable radiographic progression.  -No overt clinical or symptomatic progression to indicate the need for complex treatment options such as vandetanib and cabozantibnib -Discussed a referral for a local Endocrinologist -referral placed. -Will see back in 6 months with repeat labs and CT scans   3. Osteoporosis - Last DEXA 02/27/17 with Lowest Site Measured: Total Left hip with T-score of -3.1 and BMD of 0.568 Closed left hip fracture from fall -s/p surgery on 10/23/17 with rod placement - healing well.  -She takes Reclast injections for her osteoporosis. Managed by her PCP   4. Chronic oropharyngeal Dysphagia ? Related to previous neck surgery. Dry mouth etc. No acute changes  Plan -ENT referral for further evaluation of this since patient notes it is bothersome -discussed input regarding mx of dry mouth.  -Endocrinology referral for mx of hypothyroidism in a patient with MEN syndrome with medullary thyroid cancer. -ENT referral for oropharyngeal dysphagia -CT neck/chest/abd/pelvis in 24 weeks  -labs in 24 weeks -RTC with Dr Irene Limbo in 6 months  All of the patients questions were answered with apparent satisfaction. The patient knows to call the clinic with any problems, questions or concerns.  The total time spent in the appt was 20 minutes and more than 50% was on counseling and direct patient cares.   Sullivan Lone MD [place] AAHIVMS [facility] Hematology/Oncology Physician [facility]  (Office):       [phone] (Work cell):  [phone] (Fax):           [phone]  06/21/2019 12:04 PM  I, Yevette Edwards, am  acting as a scribe for Dr. Sullivan Lone.   .I have reviewed the above documentation for accuracy and completeness, and I agree with the above. Brunetta Genera MD

## 2019-06-24 ENCOUNTER — Telehealth: Payer: Self-pay | Admitting: Hematology

## 2019-06-24 NOTE — Telephone Encounter (Signed)
Scheduled appt per 8/21 los.  Spoke with patient and she is aware of her appt date and time.  Per patient request printed and mailed appt calendar.  Referrals are not put in at the moment.

## 2019-07-11 ENCOUNTER — Other Ambulatory Visit: Payer: Self-pay

## 2019-07-15 ENCOUNTER — Other Ambulatory Visit: Payer: Self-pay

## 2019-07-15 ENCOUNTER — Ambulatory Visit: Payer: Medicare Other | Admitting: Internal Medicine

## 2019-07-15 ENCOUNTER — Telehealth: Payer: Self-pay | Admitting: *Deleted

## 2019-07-15 ENCOUNTER — Telehealth: Payer: Self-pay | Admitting: Internal Medicine

## 2019-07-15 ENCOUNTER — Encounter: Payer: Self-pay | Admitting: Internal Medicine

## 2019-07-15 VITALS — BP 148/60 | HR 101 | Temp 98.2°F | Ht 63.0 in | Wt 98.4 lb

## 2019-07-15 DIAGNOSIS — E890 Postprocedural hypothyroidism: Secondary | ICD-10-CM | POA: Diagnosis not present

## 2019-07-15 DIAGNOSIS — C801 Malignant (primary) neoplasm, unspecified: Secondary | ICD-10-CM | POA: Diagnosis not present

## 2019-07-15 DIAGNOSIS — K529 Noninfective gastroenteritis and colitis, unspecified: Secondary | ICD-10-CM

## 2019-07-15 DIAGNOSIS — M810 Age-related osteoporosis without current pathological fracture: Secondary | ICD-10-CM

## 2019-07-15 DIAGNOSIS — E3122 Multiple endocrine neoplasia [MEN] type IIA: Secondary | ICD-10-CM

## 2019-07-15 LAB — TSH: TSH: 7.55 u[IU]/mL — ABNORMAL HIGH (ref 0.35–4.50)

## 2019-07-15 LAB — T4, FREE: Free T4: 1.07 ng/dL (ref 0.60–1.60)

## 2019-07-15 LAB — VITAMIN D 25 HYDROXY (VIT D DEFICIENCY, FRACTURES): VITD: 45.77 ng/mL (ref 30.00–100.00)

## 2019-07-15 MED ORDER — DIPHENOXYLATE-ATROPINE 2.5-0.025 MG PO TABS: 1.0000 | ORAL_TABLET | Freq: Four times a day (QID) | ORAL | 6 refills | Status: DC | PRN

## 2019-07-15 NOTE — Telephone Encounter (Signed)
Pharmacy called stating the patients diphenoxylate-atropine (LOMOTIL) 2.5-0.025 MG tablet can not go over 5 refills due to it being a controlled substances.  Please Advise, Thanks

## 2019-07-15 NOTE — Telephone Encounter (Signed)
-----   Message from Cloyd Stagers, MD sent at 07/15/2019  2:59 PM EDT ----- Can you please contact her PCP Nicanor Bake for the last bone density report ?   Thanks

## 2019-07-15 NOTE — Progress Notes (Addendum)
Name: Shannon [name]  MRN/ DOB: [id number], 11-30-1940    Age/ Sex: 78 y.o., female    PCP: Aletha Halim., PA-C   Reason for Endocrinology Evaluation: MEN2A     Date of Initial Endocrinology Evaluation: 07/15/2019     HPI: [name] is a 78 y.o. female with a past medical history of MEN2A, Osteoporosis and post-srugical hypothyroidism. The patient presented for initial endocrinology clinic visit on 07/15/2019 for consultative assistance with her MEN2A  Pt has been diagnosed with MEN2A in 2011. She is S/P thyroidectomy in 10/2009 secondary to medullary cancer.   S/P B/L neck dissection 03/2010 S/P redo dissection of the right level VI L.N in 10/2012, largest 2 cm due to mets In 2015  Had a stable right lateral compartment level IV and V L.N . The cystic right level IV L.N previously biopsied 12/2010 negative for mets.  S/P thoracoscopy (08/2014) level IX L.N with medullary mets.    She has had diarrhea in 2010, started on Imodium for years. Somatostatin was not found to be effective.   In 08/2018 was found to have an endometrial mass on MRI, further testing was benign.   She used to follow up at [facility], serial testing has come back negative for pheo or hypercalcemia.    Over the years her calcitonin and CEA levels have been gradually increasing.   Sister and a nephew with MEN2A    Today she is c/o nasal drainage and increase mucous discharge for the past year, she is under the care of ENT . She is on flonase but doesn't use it.   She continues with chronic diarrhea, diarrhea has been there since 2010. Treatment was started some time in 2013. She has been on loperamide for years, she takes 2 tabs TID, which is above the max recommended dose.  She has 2-3 bowel movements a day, Loperamide is expensive due to quantity used and is would like a prescription option.   Stool consistency has improved with consuming protein shakes.    She rarely has any abdominal pain . She  denies any bone, or back pains.   She has a great energy level.     She has been diagnosed with osteoporosis in 2016 and has been on Prolia since then, PCP is concerned about    She is on calcium BID  Vitamin D 1000 iu daily  She was on Biotin but stopped it recently.     HISTORY:  Past Medical History:  Past Medical History:  Diagnosis Date  . Anxiety   . Breast cancer ([place]) 1999   left lumpectomy/radiation/ductal carcinoma in situ  . Cancer ([place])   . Colon polyp   . Contact lens/glasses fitting    HAS LENS IMPLANTS  . Lung cancer ([place]) 2015   LYMPHNODE REMOVAL   . Medullary carcinoma of thyroid ([place]) 2011  . Osteoporosis    Past Surgical History:  Past Surgical History:  Procedure Laterality Date  . ABDOMINAL HYSTERECTOMY    . ABDOMINAL SURGERY  1989   RUPTURED APPENDIX  . BREAST SURGERY  1999   LEFT BREAST LUMPECTOMY. DUCTAL CARCINOMA IN SITU./ RADIATION  . CATARACT EXTRACTION W/ INTRAOCULAR LENS IMPLANT  2009   BOTH EYES  . FEMUR IM NAIL Left 10/23/2017   Procedure: INTRAMEDULLARY (IM) NAIL FEMORAL;  Surgeon: Paralee Cancel, MD;  Location: [facility];  Service: Orthopedics;  Laterality: Left;  . FOOT SURGERY  1070   RIGHT FOOT - PINCHED NERVE  .  LUNG SURGERY     CALCIFICATIONS, POSITIVE - CANCER LYMPHNODE   . MELANOMA EXCISION  2008   CHEST AREA  . NECK SURGERY  Nov 09 2011   LYMPHNODES REMOVED 2 POSITIVE   . RIGHT COLECTOMY  06/02/2009  . THYROIDECTOMY  11/06/2009  . TONSILLECTOMY AND ADENOIDECTOMY        Social History:  reports that she quit smoking about 58 years ago. She has never used smokeless tobacco. She reports that she does not drink alcohol or use drugs.  Family History: family history includes Breast cancer in her maternal aunt and sister; Cancer in her brother, cousin, father, paternal aunt, sister, and another family member.   HOME MEDICATIONS: Allergies as of 07/15/2019      Reactions   Codeine Other (See Comments)   hyper      Medication  List       Accurate as of July 15, 2019  2:23 PM. If you have any questions, ask your nurse or doctor.        STOP taking these medications   Biotin 1000 MCG tablet Stopped by: Dorita Sciara, MD     TAKE these medications   ALPRAZolam 1 MG tablet Commonly known as: XANAX Take 0.5 tablets (0.5 mg total) by mouth 2 (two) times daily.   caffeine 200 MG Tabs tablet Take 100 mg by mouth every morning.   CALCIUM 600 + MINERALS PO Take 1 tablet by mouth daily.   cetirizine 10 MG tablet Commonly known as: ZYRTEC Take 10 mg by mouth daily.   cholestyramine 4 g packet Commonly known as: QUESTRAN Take 4 g by mouth 2 (two) times daily.   Cod Liver Oil 1000 MG Caps Take 1,000 mg by mouth daily.   diphenoxylate-atropine 2.5-0.025 MG tablet Commonly known as: LOMOTIL Take 1 tablet by mouth 4 (four) times daily as needed for diarrhea or loose stools. Started by: Dorita Sciara, MD   Glucosamine-Chondroitin 250-200 MG Caps Take 1 tablet by mouth daily. Name of table is SCHIFF   levothyroxine 112 MCG tablet Commonly known as: SYNTHROID Take 112 mcg by mouth daily. /125MCG   loperamide 2 MG tablet Commonly known as: IMODIUM A-D Take 2 mg by mouth 3 (three) times daily.   multivitamin tablet Take 1 tablet by mouth daily.   oxyCODONE 5 MG immediate release tablet Commonly known as: Oxy IR/ROXICODONE Take 5 mg by mouth every 4 (four) hours as needed for pain.   Reclast 5 MG/100ML Soln injection Generic drug: zoledronic acid Inject 5 mg into the vein once.   simethicone 80 MG chewable tablet Commonly known as: MYLICON Chew 80 mg by mouth 3 (three) times daily.   Vitamin D 1000 units capsule Take 1,000 Units by mouth daily.         REVIEW OF SYSTEMS: A comprehensive ROS was conducted with the patient and is negative except as per HPI and below:  Review of Systems  Constitutional: Negative for chills and fever.  HENT: Positive for congestion.  Negative for sore throat.   Eyes: Positive for pain. Negative for blurred vision.  Respiratory: Negative for cough and hemoptysis.   Cardiovascular: Negative for chest pain and palpitations.  Gastrointestinal: Positive for diarrhea. Negative for nausea.  Musculoskeletal: Negative for back pain.  Skin: Negative.   Neurological: Negative for tingling and tremors.       OBJECTIVE:  VS: BP (!) 148/60 (BP Location: Right Arm, Patient Position: Sitting, Cuff Size: Normal)   Pulse (!) 101  Temp 98.2 F (36.8 C) (Oral)   Ht 5' 3"  (1.6 m)   Wt 98 lb 6 oz (44.6 kg)   SpO2 96%   BMI 17.43 kg/m    Wt Readings from Last 3 Encounters:  07/15/19 98 lb 6 oz (44.6 kg)  06/21/19 99 lb (44.9 kg)  06/06/19 100 lb (45.4 kg)     EXAM: General: Pt appears well and is in NAD  Hydration: Well-hydrated with moist mucous membranes and good skin turgor  Eyes: External eye exam normal without stare, lid lag or exophthalmos.  EOM intact.  PERRL.  Ears, Nose, Throat: Hearing: Grossly intact bilaterally Dental: Good dentition  Throat: Clear without mass, erythema or exudate  Neck: General: Supple without adenopathy. Thyroid: Thyroid size normal.  No goiter or nodules appreciated. No thyroid bruit.  Lungs: Clear with good BS bilat with no rales, rhonchi, or wheezes  Heart: Auscultation: RRR.  Abdomen: Normoactive bowel sounds, soft, nontender, without masses or organomegaly palpable  Extremities: Gait and station: Normal gait  Digits and nails: No clubbing, cyanosis, petechiae, or nodes Head and neck: Normal alignment and mobility BL UE: Normal ROM and strength. BL LE: No pretibial edema normal ROM and strength.  Skin: Hair: Texture and amount normal with gender appropriate distribution Skin Inspection: No rashes, acanthosis nigricans/skin tags. No lipohypertrophy Skin Palpation: Skin temperature, texture, and thickness normal to palpation  Neuro: Cranial nerves: II - XII grossly intact   Cerebellar: Normal coordination and movement; no tremor Motor: Normal strength throughout DTRs: 2+ and symmetric in UE without delay in relaxation phase  Mental Status: Judgment, insight: Intact Orientation: Oriented to time, place, and person Memory: Intact for recent and remote events Mood and affect: No depression, anxiety, or agitation     DATA REVIEWED: Results for DIAMON, REDDINGER (MRN [id number]) as of 07/16/2019 16:24  Ref. Range 07/15/2019 11:08  Calcium Latest Ref Range: 8.6 - 10.4 mg/dL 9.6  VITD Latest Ref Range: 30.00 - 100.00 ng/mL 45.77  PTH, Intact Latest Ref Range: 14 - 64 pg/mL 7 (L)  TSH Latest Ref Range: 0.35 - 4.50 uIU/mL 7.55 (H)  T4,Free(Direct) Latest Ref Range: 0.60 - 1.60 ng/dL 1.07        CT chest/Abdomen/Pelvis 06/18/2019  CT CHEST FINDINGS  Cardiovascular: The heart size appears normal. Aortic atherosclerosis. Calcification in the LAD, RCA and left circumflex coronary artery.  Mediastinum/Nodes: Status post thyroidectomy. Insert esophagus The trachea appears patent and is midline. Partially calcified mediastinal and hilar lymph nodes are again noted. Similar to previous exam. Index right paratracheal lymph node measures 1 cm, image 27/2. Index subcarinal lymph node measures 1.2 cm, image 32/2. Unchanged.  Lungs/Pleura: No pleural effusion identified. Extensive biapical pleuroparenchymal scarring. Masslike architectural distortion with calcifications identified within the right upper lobe laterally, unchanged. Stable left apex scarring.  Musculoskeletal: Unchanged sclerotic lesions involving the cervical and thoracic spine including lesion involving T3 and T8. No new or progressive bone metastases.  CT ABDOMEN PELVIS FINDINGS  Hepatobiliary: Unchanged appearance of liver cysts. No suspicious liver lesion. Gallbladder is unremarkable. No biliary dilatation.  Pancreas: Unremarkable. No pancreatic ductal dilatation or surrounding  inflammatory changes.  Spleen: 1 cm low-density structure within the spleen identified. Indeterminate. Spleen otherwise unremarkable and normal in size.  Adrenals/Urinary Tract: Normal appearance of the adrenal glands. Kidneys are unremarkable. No mass or hydronephrosis. Urinary bladder appears normal.  Stomach/Bowel: Stomach is within normal limits. No evidence of bowel wall thickening, distention, or inflammatory changes.  Vascular/Lymphatic: Aortic atherosclerosis. No aneurysm. No  abdominopelvic adenopathy identified. No inguinal adenopathy.  Reproductive: Status post hysterectomy. No adnexal masses.  Other: No free fluid or fluid collections.  Musculoskeletal: Sclerotic lesion within the L1 vertebra is unchanged measuring 9 mm, image 60/2. Previous ORIF of the proximal left femur. Two small sclerotic foci within the left sacral wing and left iliac bone are noted, nonspecific. Additional small sclerotic lesions are noted scattered throughout the lumbar spine the measuring up to 5 mm. Not imaged previously.  IMPRESSION: 1. No acute findings within the abdomen or pelvis. 2. Scattered sclerotic bone lesions are identified within the thoracic, lumbar spine and bony pelvis. Suspicious for bone metastases. These sclerotic lesions noted on the previously image CT of the chest are stable. No prior imaging available for comparison of lower lumbar spine and bony pelvis sclerotic lesions. 3. No soft tissue mass or adenopathy identified. 4. Similar appearance of extensive biapical pleuroparenchymal scarring, right greater than left. Partially calcified area of masslike architectural distortion in the right apex is unchanged.  Results for RONNITA, PAZ (MRN [id number]) as of 07/15/2019 09:30  Ref. Range 06/14/2019 10:05  Sodium Latest Ref Range: 135 - 145 mmol/L 143  Potassium Latest Ref Range: 3.5 - 5.1 mmol/L 3.9  Chloride Latest Ref Range: 98 - 111 mmol/L 105  CO2 Latest  Ref Range: 22 - 32 mmol/L 27  Glucose Latest Ref Range: 70 - 99 mg/dL 84  BUN Latest Ref Range: 8 - 23 mg/dL 14  Creatinine Latest Ref Range: 0.44 - 1.00 mg/dL 0.81  Calcium Latest Ref Range: 8.9 - 10.3 mg/dL 9.6  Anion gap Latest Ref Range: 5 - 15  11  Magnesium Latest Ref Range: 1.7 - 2.4 mg/dL 1.9  Alkaline Phosphatase Latest Ref Range: 38 - 126 U/L 82  Albumin Latest Ref Range: 3.5 - 5.0 g/dL 4.2  AST Latest Ref Range: 15 - 41 U/L 25  ALT Latest Ref Range: 0 - 44 U/L 21  Total Protein Latest Ref Range: 6.5 - 8.1 g/dL 7.1  Total Bilirubin Latest Ref Range: 0.3 - 1.2 mg/dL 0.8  GFR, Est Non African American Latest Ref Range: >60 mL/min >60  GFR, Est African American Latest Ref Range: >60 mL/min >60   Results for GIRTRUDE, ENSLIN (MRN [id number]) as of 07/15/2019 09:30  Ref. Range 06/14/2019 10:05  Calcitonin Latest Ref Range: 0.0 - 5.0 pg/mL 23,524.0 (H)  CEA ([facility]) Latest Ref Range: 0.00 - 5.00 ng/mL 39.41 (H)  Results for BEATRIZ, QUINTELA (MRN [id number]) as of 07/15/2019 09:30  Ref. Range 06/14/2019 10:06  TSH Latest Ref Range: 0.308 - 3.960 uIU/mL 4.060 (H)  T4,Free(Direct) Latest Ref Range: 0.61 - 1.12 ng/dL 1.20 (H)      DXA 2016 2018 05/15/2019  Right Femur -2.5 -2.7 -2.6  Total Hip  -2.7 -3.2  Left Femur -1.5 -2.3 N/A  Total Hip  -3.1 N/A  L-S Spine  -2.0 -2.4 -2.6   ASSESSMENT/PLAN/RECOMMENDATIONS:   1. MEN2A:  - Pt with medullary carcinoma , historically no evidence of pheochromocytoma or hypercalcemia - Will proceed with PTH and metanephrine testing today    2. Medullary Carcinoma:   - S/P thyroidectomy in 2011 followed by dissection and redo of the right level VI L.N in 10/2012, she is also S/P thoracoscopy (08/2014) level IX L.N with medullary mets.  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 - Has been on Imodium for years, with ~3 BM's  a day but this has gotten  expensive as she is using over the recommended daily dose, will try Lomotil - Pt has not been found to be a candidate for TKI 's , despite their role in improving progression-free survival compared to placebo but has not shown to improve overall survival.    Medications  Lomotil 2.5 mg 1 Tab BID , may increase to 2 Tabs BID    3. Post-surgical Hypothyroidism:  - Clinically she is euthyroid  - Repeat TFT's today continue to show elevation of TSH , will adjust the dose as below    Medications :  Stop Levothyroxine 112 mcg Increase Levothyroxine 125 mcg daily   4. Osteoporosis :   - Per PCP's note pt has received 6 infusions of Reclast - I would recommend continuing this for another 2 yrs and reassess again, given the worsening , (despite non-significant decrease in hip and spine), the benefit would out weight the Risk of atypical fracture and osteonecrosis.  - Repeat DXA in 2 yrs  F/U in 6 months    Signed electronically by: Mack Guise, MD  [facility] Endocrinology  [facility] [address] [place], [place] [address] Phone: [phone] FAX: [phone]   CC: Amie Critchley [address] [place] [address] Phone: [phone] Fax: [phone]   Return to Endocrinology clinic as below: Future Appointments  Date Time Provider [place]  12/06/2019 11:00 AM [facility] LAB 3 [facility] None  12/06/2019 12:00 PM [facility]-CT 2 [facility]-CT [place]  12/06/2019 12:30 PM [facility]-CT 2 [facility]-CT   12/19/2019  2:20 PM Brunetta Genera, MD [facility] None  01/13/2020 10:30 AM , Melanie Crazier, MD [facility] None

## 2019-07-15 NOTE — Telephone Encounter (Signed)
Called pcp's office to request most recent DEXA scan results. They are faxing results to [place] for Dr. Quin Hoop review.

## 2019-07-15 NOTE — Telephone Encounter (Signed)
Noted  

## 2019-07-15 NOTE — Patient Instructions (Signed)
-   Stop Loperamide   - Start Lomotil  Tablet twice as needed, may increase to tablets daily if needed ( you can take 2 tablets twice a day )

## 2019-07-16 ENCOUNTER — Telehealth: Payer: Self-pay | Admitting: Internal Medicine

## 2019-07-16 MED ORDER — LEVOTHYROXINE SODIUM 125 MCG PO TABS: 125.0000 ug | ORAL_TABLET | Freq: Every day | ORAL | 3 refills | Status: DC

## 2019-07-16 NOTE — Telephone Encounter (Signed)
Tashia , Can you please let her know the following    1. Thyroid is still off, needs to increase dose. Stop Levo 112 and start 125 mcg daily, needs a recheck in 8 weeks    2. Vitamin D and calcium are normal.    3. We got her bone density, I suggest continuing the prolia for another 2 yrs, as her spine and hip got a bit worse.     Thanks   Abby Nena Jordan, MD  [facility] Endocrinology  [facility] [address] [place], [place] [address] Phone: [phone] FAX: [phone]

## 2019-07-17 NOTE — Telephone Encounter (Signed)
Pt stated that she does reclast and not prolia

## 2019-07-17 NOTE — Telephone Encounter (Signed)
Pt aware of results and f/u labs scheduled

## 2019-07-18 ENCOUNTER — Telehealth: Payer: Self-pay | Admitting: Internal Medicine

## 2019-07-18 NOTE — Telephone Encounter (Signed)
This was sent 07/15/19 to [facility] with 6 refills please have pt contact pharmacy

## 2019-07-18 NOTE — Telephone Encounter (Signed)
Patient called to request the following RX be resent to the [facility] as the [facility] states they do not have it:  MEDICATION: diphenoxylate-atropine (LOMOTIL) 2.5-0.025 MG tablet  PHARMACY:   [place], [place] - [address] [phone] (Phone) [phone] (Fax)    IS THIS A 90 DAY SUPPLY : Yes  IS PATIENT OUT OF MEDICATION: Yes  IF NOT; HOW MUCH IS LEFT: 0  LAST APPOINTMENT DATE: @9 /15/2020  NEXT APPOINTMENT DATE:@11 /01/2019  DO WE HAVE YOUR PERMISSION TO LEAVE A DETAILED MESSAGE: Yes  OTHER COMMENTS:    **Let patient know to contact pharmacy at the end of the day to make sure medication is ready. **  ** Please notify patient to allow 48-72 hours to process**  **Encourage patient to contact the pharmacy for refills or they can request refills through [facility]**

## 2019-07-22 ENCOUNTER — Telehealth: Payer: Self-pay | Admitting: Internal Medicine

## 2019-07-22 ENCOUNTER — Telehealth: Payer: Self-pay | Admitting: *Deleted

## 2019-07-22 ENCOUNTER — Other Ambulatory Visit: Payer: Self-pay

## 2019-07-22 ENCOUNTER — Other Ambulatory Visit: Payer: Self-pay | Admitting: Hematology

## 2019-07-22 MED ORDER — DIPHENOXYLATE-ATROPINE 2.5-0.025 MG PO TABS: 1.0000 | ORAL_TABLET | Freq: Four times a day (QID) | ORAL | 6 refills | Status: DC | PRN

## 2019-07-22 NOTE — Telephone Encounter (Signed)
Patient has called to advise that [facility] has not received the RX below - I called pharmacy myself and confirmed that the have no record. Please resend   diphenoxylate-atropine (LOMOTIL) 2.5-0.025 MG tablet 120 tablet 6 07/15/2019

## 2019-07-22 NOTE — Telephone Encounter (Signed)
Called pharmacy with provider orders.  Per pharmacist "she had another order from someone else."

## 2019-07-22 NOTE — Telephone Encounter (Signed)
[facility] for [facility] error message received to notify of provider request for patient to contact office first.  Pharmacist reports "that's alright, she had an order from somewhere else."

## 2019-07-22 NOTE — Telephone Encounter (Signed)
Could not get rx to e-scribe so printed and faxed refill to pharnmacy

## 2019-07-26 LAB — CATECHOLAMINES, FRACTIONATED, PLASMA
Dopamine: 36 pg/mL — ABNORMAL HIGH
Epinephrine: 27 pg/mL
Norepinephrine: 1097 pg/mL
Total Catecholamines: 1160 pg/mL — ABNORMAL HIGH

## 2019-07-26 LAB — METANEPHRINES, PLASMA
Metanephrine, Free: 76 pg/mL — ABNORMAL HIGH (ref <=57)
Normetanephrine, Free: 192 pg/mL — ABNORMAL HIGH (ref <=148)
Total Metanephrines-Plasma: 268 pg/mL — ABNORMAL HIGH (ref <=205)

## 2019-07-26 LAB — PTH, INTACT AND CALCIUM
Calcium: 9.6 mg/dL (ref 8.6–10.4)
PTH: 7 pg/mL — ABNORMAL LOW (ref 14–64)

## 2019-07-31 ENCOUNTER — Telehealth: Payer: Self-pay | Admitting: Internal Medicine

## 2019-07-31 DIAGNOSIS — E3122 Multiple endocrine neoplasia [MEN] type IIA: Secondary | ICD-10-CM

## 2019-07-31 NOTE — Telephone Encounter (Signed)
We discussed her somewhat elevated plasma metanephrine and catecholamines  But not to the level of pheo.   She did have these elevated in the past as well through [facility].   We have decided to proceed with 24-hr urine catecholamine metanephrines due to less false positive results.     Abby Nena Jordan, MD  [facility]  [facility] [address] [place], [place] [address] Phone: [phone] FAX: [phone]

## 2019-08-02 ENCOUNTER — Other Ambulatory Visit: Payer: Self-pay

## 2019-08-02 ENCOUNTER — Other Ambulatory Visit: Payer: Medicare Other

## 2019-08-05 ENCOUNTER — Other Ambulatory Visit: Payer: Self-pay

## 2019-08-05 ENCOUNTER — Other Ambulatory Visit: Payer: Medicare Other

## 2019-08-05 DIAGNOSIS — E3122 Multiple endocrine neoplasia [MEN] type IIA: Secondary | ICD-10-CM

## 2019-08-08 ENCOUNTER — Other Ambulatory Visit: Payer: Self-pay

## 2019-08-08 DIAGNOSIS — Z20822 Contact with and (suspected) exposure to covid-19: Secondary | ICD-10-CM

## 2019-08-09 LAB — NOVEL CORONAVIRUS, NAA: SARS-CoV-2, NAA: NOT DETECTED

## 2019-08-14 LAB — METANEPHRINES, URINE, 24 HOUR
Metaneph Total, Ur: 554 mcg/24 h (ref 224–832)
Metanephrines, Ur: 205 mcg/24 h (ref 90–315)
Normetanephrine, 24H Ur: 349 mcg/24 h (ref 122–676)
Volume, Urine-VMAUR: 850 mL

## 2019-08-14 LAB — CATECHOLAMINES, FRACTIONATED, URINE, 24 HOUR
Calc Total (E+NE): 38 mcg/24 h (ref 26–121)
Creatinine, Urine mg/day-CATEUR: 0.51 g/(24.h) (ref 0.50–2.15)
Dopamine 24 Hr Urine: 163 mcg/24 h (ref 52–480)
Norepinephrine, 24H, Ur: 38 mcg/24 h (ref 15–100)
Total Volume: 850 mL

## 2019-08-15 ENCOUNTER — Encounter: Payer: Self-pay | Admitting: Internal Medicine

## 2019-08-30 ENCOUNTER — Telehealth: Payer: Self-pay | Admitting: Internal Medicine

## 2019-08-30 NOTE — Telephone Encounter (Signed)
I reviewed the chart and saw the Dr. Kelton Pillar actually wanted her to have Lomotil.  The last time that she was prescribed cholestyramine was in 2017.  I would leave it up to Dr. Kelton Pillar if she wants to start prescribing this for the patient.

## 2019-08-30 NOTE — Telephone Encounter (Signed)
Please review Dr. Arman Filter response and advise if this medication is something you would like to begin managing.

## 2019-08-30 NOTE — Telephone Encounter (Signed)
°  MEDICATION: Cholestyramine  PHARMACY:  [facility] on [address]  IS THIS A 90 DAY SUPPLY :   IS PATIENT OUT OF MEDICATION:   IF NOT; HOW MUCH IS LEFT: 3 left  LAST APPOINTMENT DATE: @10 /12/2018  NEXT APPOINTMENT DATE:@11 /01/2019  DO WE HAVE YOUR PERMISSION TO LEAVE A DETAILED MESSAGE: yes - [phone]  OTHER COMMENTS:    **Let patient know to contact pharmacy at the end of the day to make sure medication is ready. **  ** Please notify patient to allow 48-72 hours to process**  **Encourage patient to contact the pharmacy for refills or they can request refills through [facility]**

## 2019-08-30 NOTE — Telephone Encounter (Signed)
Please advise if this is appropriate to refill on Dr. Quin Hoop behalf

## 2019-09-02 ENCOUNTER — Other Ambulatory Visit: Payer: Self-pay | Admitting: Internal Medicine

## 2019-09-02 MED ORDER — CHOLESTYRAMINE 4 G PO PACK: 4.0000 g | PACK | Freq: Two times a day (BID) | ORAL | 3 refills | Status: DC

## 2019-09-04 ENCOUNTER — Other Ambulatory Visit: Payer: Self-pay

## 2019-09-04 ENCOUNTER — Other Ambulatory Visit (INDEPENDENT_AMBULATORY_CARE_PROVIDER_SITE_OTHER): Payer: Medicare Other

## 2019-09-04 ENCOUNTER — Encounter: Payer: Self-pay | Admitting: Internal Medicine

## 2019-09-04 ENCOUNTER — Telehealth: Payer: Self-pay | Admitting: Internal Medicine

## 2019-09-04 DIAGNOSIS — E890 Postprocedural hypothyroidism: Secondary | ICD-10-CM | POA: Diagnosis not present

## 2019-09-04 LAB — T4, FREE: Free T4: 1.42 ng/dL (ref 0.60–1.60)

## 2019-09-04 LAB — TSH: TSH: 3.5 u[IU]/mL (ref 0.35–4.50)

## 2019-09-04 NOTE — Telephone Encounter (Signed)
Error

## 2019-12-05 ENCOUNTER — Other Ambulatory Visit: Payer: Self-pay | Admitting: *Deleted

## 2019-12-05 DIAGNOSIS — C73 Malignant neoplasm of thyroid gland: Secondary | ICD-10-CM

## 2019-12-06 ENCOUNTER — Ambulatory Visit (HOSPITAL_COMMUNITY)
Admission: RE | Admit: 2019-12-06 | Discharge: 2019-12-06 | Disposition: A | Discharge status: Home / Self Care | Payer: Medicare Other | Source: Ambulatory Visit | Attending: Hematology | Admitting: Hematology

## 2019-12-06 ENCOUNTER — Ambulatory Visit (HOSPITAL_COMMUNITY): Payer: Medicare Other

## 2019-12-06 ENCOUNTER — Other Ambulatory Visit: Payer: Self-pay

## 2019-12-06 ENCOUNTER — Inpatient Hospital Stay: Payer: Medicare Other | Attending: Hematology

## 2019-12-06 DIAGNOSIS — M899 Disorder of bone, unspecified: Secondary | ICD-10-CM | POA: Diagnosis not present

## 2019-12-06 DIAGNOSIS — C73 Malignant neoplasm of thyroid gland: Secondary | ICD-10-CM | POA: Diagnosis not present

## 2019-12-06 DIAGNOSIS — I700 Atherosclerosis of aorta: Secondary | ICD-10-CM | POA: Diagnosis not present

## 2019-12-06 DIAGNOSIS — K7689 Other specified diseases of liver: Secondary | ICD-10-CM | POA: Diagnosis not present

## 2019-12-06 LAB — CMP (CANCER CENTER ONLY)
ALT: 22 U/L (ref 0–44)
AST: 23 U/L (ref 15–41)
Albumin: 4.7 g/dL (ref 3.5–5.0)
Alkaline Phosphatase: 76 U/L (ref 38–126)
Anion gap: 11 (ref 5–15)
BUN: 16 mg/dL (ref 8–23)
CO2: 30 mmol/L (ref 22–32)
Calcium: 9.5 mg/dL (ref 8.9–10.3)
Chloride: 102 mmol/L (ref 98–111)
Creatinine: 0.82 mg/dL (ref 0.44–1.00)
GFR, Est AFR Am: >60 mL/min (ref >60)
GFR, Estimated: >60 mL/min (ref >60)
Glucose, Bld: 82 mg/dL (ref 70–99)
Potassium: 3.8 mmol/L (ref 3.5–5.1)
Sodium: 143 mmol/L (ref 135–145)
Total Bilirubin: 1 mg/dL (ref 0.3–1.2)
Total Protein: 7.6 g/dL (ref 6.5–8.1)

## 2019-12-06 MED ORDER — IOHEXOL 300 MG/ML  SOLN
100.0000 mL | Freq: Once | INTRAMUSCULAR | Status: AC | PRN
  Administered 2019-12-06: 80 mL via INTRAVENOUS

## 2019-12-06 MED ORDER — SODIUM CHLORIDE (PF) 0.9 % IJ SOLN
INTRAMUSCULAR | Status: AC
  Filled 2019-12-06: qty 50

## 2019-12-19 ENCOUNTER — Ambulatory Visit: Payer: Medicare Other | Admitting: Hematology

## 2019-12-23 ENCOUNTER — Telehealth: Payer: Self-pay | Admitting: Hematology

## 2019-12-23 NOTE — Telephone Encounter (Signed)
Rescheduled appt per 2/18 sch msg. Pt is aware of new appt date and time.

## 2020-01-03 ENCOUNTER — Telehealth: Payer: Self-pay

## 2020-01-03 NOTE — Telephone Encounter (Signed)
TCT patient regarding Lab appt for 01/06/20 at 0815. Patient knows to arrive 15 minutes early. She verbalized understanding.

## 2020-01-06 ENCOUNTER — Inpatient Hospital Stay: Payer: Medicare Other | Attending: Hematology | Admitting: Hematology

## 2020-01-06 ENCOUNTER — Inpatient Hospital Stay: Payer: Medicare Other

## 2020-01-06 ENCOUNTER — Other Ambulatory Visit: Payer: Self-pay

## 2020-01-06 VITALS — BP 138/66 | HR 95 | Temp 98.0°F | Resp 18 | Ht 63.0 in | Wt 96.4 lb

## 2020-01-06 DIAGNOSIS — C7800 Secondary malignant neoplasm of unspecified lung: Secondary | ICD-10-CM | POA: Diagnosis not present

## 2020-01-06 DIAGNOSIS — C73 Malignant neoplasm of thyroid gland: Secondary | ICD-10-CM | POA: Diagnosis present

## 2020-01-06 DIAGNOSIS — R197 Diarrhea, unspecified: Secondary | ICD-10-CM | POA: Insufficient documentation

## 2020-01-06 DIAGNOSIS — C7951 Secondary malignant neoplasm of bone: Secondary | ICD-10-CM | POA: Insufficient documentation

## 2020-01-06 DIAGNOSIS — E070 Hypersecretion of calcitonin: Secondary | ICD-10-CM | POA: Diagnosis not present

## 2020-01-06 DIAGNOSIS — M810 Age-related osteoporosis without current pathological fracture: Secondary | ICD-10-CM | POA: Insufficient documentation

## 2020-01-06 LAB — CMP (CANCER CENTER ONLY)
ALT: 20 U/L (ref 0–44)
AST: 22 U/L (ref 15–41)
Albumin: 4.1 g/dL (ref 3.5–5.0)
Alkaline Phosphatase: 79 U/L (ref 38–126)
Anion gap: 7 (ref 5–15)
BUN: 15 mg/dL (ref 8–23)
CO2: 31 mmol/L (ref 22–32)
Calcium: 9.5 mg/dL (ref 8.9–10.3)
Chloride: 104 mmol/L (ref 98–111)
Creatinine: 0.75 mg/dL (ref 0.44–1.00)
GFR, Est AFR Am: >60 mL/min (ref >60)
GFR, Estimated: >60 mL/min (ref >60)
Glucose, Bld: 87 mg/dL (ref 70–99)
Potassium: 4.7 mmol/L (ref 3.5–5.1)
Sodium: 142 mmol/L (ref 135–145)
Total Bilirubin: 0.8 mg/dL (ref 0.3–1.2)
Total Protein: 7 g/dL (ref 6.5–8.1)

## 2020-01-06 LAB — CBC WITH DIFFERENTIAL/PLATELET
Abs Immature Granulocytes: 0.01 10*3/uL (ref 0.00–0.07)
Basophils Absolute: 0.1 10*3/uL (ref 0.0–0.1)
Basophils Relative: 1 %
Eosinophils Absolute: 0.2 10*3/uL (ref 0.0–0.5)
Eosinophils Relative: 3 %
HCT: 42.2 % (ref 36.0–46.0)
Hemoglobin: 13.8 g/dL (ref 12.0–15.0)
Immature Granulocytes: 0 %
Lymphocytes Relative: 13 %
Lymphs Abs: 0.8 10*3/uL (ref 0.7–4.0)
MCH: 29 pg (ref 26.0–34.0)
MCHC: 32.7 g/dL (ref 30.0–36.0)
MCV: 88.7 fL (ref 80.0–100.0)
Monocytes Absolute: 0.7 10*3/uL (ref 0.1–1.0)
Monocytes Relative: 12 %
Neutro Abs: 4.4 10*3/uL (ref 1.7–7.7)
Neutrophils Relative %: 71 %
Platelets: 203 10*3/uL (ref 150–400)
RBC: 4.76 MIL/uL (ref 3.87–5.11)
RDW: 12.8 % (ref 11.5–15.5)
WBC: 6.2 10*3/uL (ref 4.0–10.5)
nRBC: 0 % (ref 0.0–0.2)

## 2020-01-06 LAB — MAGNESIUM: Magnesium: 1.9 mg/dL (ref 1.7–2.4)

## 2020-01-06 LAB — CEA (IN HOUSE-CHCC): CEA (CHCC-In House): 39.02 ng/mL — ABNORMAL HIGH (ref 0.00–5.00)

## 2020-01-06 NOTE — Progress Notes (Signed)
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 total thyroidectomy in January of 2011 and bilateral neck dissection in June of 2011.  B. Persistent hypercalcitoninemia postoperatively. Calcitonin 499 in 07/2011  C. December 2012: CT of neck/chest was negative for metastases D. She had a normal neck ultrasound in 01/2012 showing her cystic LN was smaller (the one biopsied and negative).  E. FDG PET 08/2012 due to her last calcitonin raising substantially to 739 from the 500s: concerning right cervical lymph node F. 11/08/2012: Level 6 LN dissection. She had 2/2 LN positive, largest 2cm for MTC. Calcitonin one week later from her surgery was down to 515.  G. Calcitonin 592 on 05/24/2013. Repeat ultrasound on that same day which showed 2 at least partially cystic lymph nodes at level 4 which had been previously biopsied and negative; decreased in size since 2012. It also showed a subcentimeter right level VB LN of low suspicion. H. Her metanephrines (plasma) and PTH were normal in 12/2013 Urine studies for pheo were negative in 12/2013. I. 08/04/2014: CEA 9.0; calcitonin 1019 J. 09/05/2014:  - C/A/P CT scan: Interval development of a new calcified nodule in the left lower lobe. - Bone scan: Slight asymmetric increased radiotracer uptake in the right lateral calvarium on the frontal radiograph - Neck U/S: Stable right lateral compartment level 4 and 5B lymph nodes. The cystic right level 4 lymph node, previously biopsied on 01/14/2011 with negative pathology results.  K. 09/23/2014: Thoracoscopy: level 9 LN: Metastatic medullary thyroid carcinoma.  L. 03/11/2015:  CEA 15.9; calcitonin 1149 M 04-13-2015:  - C/A/P CT scan: Previously reported left lower lobe pulmonary nodule is no longer identified. No new or enlarging pulmonary nodules. No evidence of metastatic disease in the abdomen or pelvis. - CT brain:No acute intracranial abnormalities.  New small CSF density subdural fluid collection overlying the right cerebral hemisphere, likely a small hygroma of uncertain etiology. There is no associated bony or parenchymal lesion. 04-13-2015: Bone scan: A non-specific, very small focus of increased tracer activity in the right frontal calvarium is not significantly changed from the prior study. - Neck U/S: There is hypoechoic lesion with questionable microcalcification located in right level IV, it is 0.4 x 0.6 x 0.9 cm. No other pathologic cervical lymphadenopathy seen.  N. 04/13/2015: CEA 14.6; calcitonin 1324 O. 07/16/2015 ([facility]): CEA 19.4 (outsode of [facility]); CAlcitonin 1860. P. 09-07-2015: Neck, Chest , Abd, [place] CT: Stable post-thyroidectomy changes without evidence of recurrent or metastatic disease. Stable pulmonary nodules. Hepatic cysts. No bone mets. CEA: 16; Calcitonin 1533 Q. 03-08-2016: C/A/P CT scan: No definite evidence of metastatic disease in the chest, abdomen or pelvis. Compared to 09/07/2015, there is a new linear 3 mm right lower lobe pulmonary nodule which is indeterminate and may represent focal atelectasis CEA 16.5; CAlcitonin 1839  R. 08-22-2016: Calcitonin: 4596; CEA 27.1  S. 09-06-2016:  - Bone scan: Redemonstrated likely two foci of increased radiotracer uptake within the right calvarium, similar to prior study. - Neck CT: No evidence of recurrent or metastatic disease in the neck. - C/A/P CT: No definite evidence of metastatic disease in the chest, abdomen, or pelvis. - Neck CT: Status post thyroidectomy  and lymph node dissection without evidence of recurrent or metastatic disease.  T. Bone scan: A focus of increased tracer activity  projecting over the posterior right approximate 8th rib is non-specific; this finding may relate to summation of tracer activity in the rib with the inferior tip of the overlying right scapula, post-traumatic change or skeletal metastasis; recommend attention on follow-up. Re-demonstration of subtly increased tracer activity in the right frontal calvarium, stable since 09/05/2014. U. 02-13-2017:  - C/A/P CT scan: nonenlarged mediastinal lymph nodes and soft tissue nodule in the right thyroidectomy bed. Unchanged irregular opacities in the lung apices bilaterally, which may represent treated metastases or scarring.  - Neck CT: Numerous soft tissue enhancing lesions in the superior mediastinum. While these are stable when compared to most recent CT, several of these lesions demonstrate minimal growth from the more remote exam on 09/07/2015. No new enhancing lesions are present. - CEA 25.1, Calcitonin: 5498 V. 08-28-2017: fractionate plasma free metanephrines: Normetanephrine 178 (24hr urine pending as this was thought to be non-specific), Metanephrine: 44 (normal). CEA 46; Calcitonin 9763 W. 09-27-2017: Bone scan: Stable increased radiotracer activity in the right frontal calvarium, indeterminate. No other evidence of osseous metastasis Chest CT/Abd: Mildly enlarged mediastinal lymph nodes are stable. No evidence of metastatic disease in the abdomen   HISTORY OF PRESENTING ILLNESS:   Shannon [name] is a wonderful 79 y.o. female who has been referred to [place] by [place] oncologist Dr. Leslie Andrea for management of Metastatic Medullary Thyroid Cancer. She is looking for a Financial controller in [place].  Today she is accompanied by her sister. Patient notes this started with a nodule in her throat and her endocrinologist started further work up. She has been seeing Med onc at [facility] since her first surgery every 3-6 months.   She has had thyroid surgery and Ldissection but no systemic therapy as  detailed above in the oncologic history.  She notes her family had testing for MEN2A mutation. Her sister, her nephew carry the gene. She notes her maternal aunt had goiters but not sure of the cause.   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 She has had diarrhea since 2010. Her last colonoscopy was in 2010. She has tried multiple treatment options but this continues to worsen over time. She currently takes imodium 4-6 X daily, cholestyramine twice daily. She started monthly Sandostatin on 10/20/17. She fell and broke her hip on 10/23/17 and had surgery same day. She was given hydrocodone which helped her diarrhea and is not sure if the Sandostatin is working for her. She has been on oral iron and she is on aspirin 37m since her hip surgery. She does PT exercises and has been healing well.    She recently stopped caffeine tablets that she was taking for energy. She stopped 3 months ago. She is on Reclast for her osteoporosis, which is managed by her PCP, Dr. KDeatra Ina Pt notes she stopped smoking about 40 years ago and she rarely drinks alcohol.   On review of symptoms, pt notes continued diarrhea, stable energy, weight loss of 6-8 pounds in the last 6 months. Her appetite is adequate. Her diarrhea occurs currently twice a day with "pudding" form to liquid form stool. She can go up to 5-7 times a day at times. She denies cramping with diarrhea. She denies blood, and mucus in stool. Current use of oral iron causes some black stool, which was not present before use.   INTERVAL HISTORY:  Shannon [name] returns today regarding her Metastatic Medullary Thyroid Cancer. The patient's last visit with [place] was on 06/21/2019. The pt reports that she is doing well overall.  The pt reports that she has stopped drinking caffeine which has improved her diarrhea. She is currently having two bowel movements daily. Pt has continued taking Imodium and Cholestyramine. She is still  experiencing some acid reflux and is no longer following with an ENT, although the recommended she see a GI. She is not currently taking any medications for her acid reflux. Pt has had both doses of the COVID19 vaccine. She denies any concerns or residual symptoms.   Of note since the patient's last visit, pt has had CT C/A/P ([id number]) ([id number]) completed on 12/06/2019 with results revealing "1. Stable exam. No new or progressive findings to suggest progressive metastatic disease. 2. Scattered sclerotic bone lesions, similar to prior. 3. Stable mild intrahepatic and extrahepatic biliary duct dilatation. 4. Trace free fluid in the cul-de-sac."  Of note since the patient's last visit, pt has had CT Soft Tissue Neck ([id number]) completed on 12/06/2019 with results revealing "1. Thyroidectomy with nodal metastases at the thoracic inlet/anterior compartment and right supraclavicular fossa. There are no prior neck CT comparison is available for review. 2. Spinal osseous metastatic disease without visible extraosseous tumor extension."  Lab results today (01/06/20) of CBC w/diff and CMP is as follows: all values are WNL. 01/06/2020 CEA at 39.02 01/06/2020 Magnesium at 1.9 01/06/2020 Calcitonin is in progress  On review of systems, pt reports healthy appetite, lump in right neck and denies unexpected weight loss, bone pain, SOB, change in breathing, fevers, chills, night sweats, leg swelling, fatigue, diarrhea and any other symptoms.   MEDICAL HISTORY:  Past Medical History:  Diagnosis Date  . Anxiety   . Breast cancer ([place]) 1999   left lumpectomy/radiation/ductal carcinoma in situ  . Cancer ([place])   . Colon polyp   . Contact lens/glasses fitting    HAS LENS IMPLANTS  . Lung cancer ([place]) 2015   LYMPHNODE REMOVAL   . Medullary carcinoma of thyroid ([place]) 2011  . Osteoporosis     SURGICAL HISTORY: Past Surgical History:  Procedure Laterality Date  . ABDOMINAL HYSTERECTOMY    . ABDOMINAL  SURGERY  1989   RUPTURED APPENDIX  . BREAST SURGERY  1999   LEFT BREAST LUMPECTOMY. DUCTAL CARCINOMA IN SITU./ RADIATION  . CATARACT EXTRACTION W/ INTRAOCULAR LENS IMPLANT  2009   BOTH EYES  . FEMUR IM NAIL Left 10/23/2017   Procedure: INTRAMEDULLARY (IM) NAIL FEMORAL;  Surgeon: Paralee Cancel, MD;  Location: [facility];  Service: Orthopedics;  Laterality: Left;  . FOOT SURGERY  1070   RIGHT FOOT - PINCHED NERVE  . LUNG SURGERY     CALCIFICATIONS, POSITIVE - CANCER LYMPHNODE   . MELANOMA EXCISION  2008   CHEST AREA  . NECK SURGERY  Nov 09 2011   LYMPHNODES REMOVED 2 POSITIVE   . RIGHT COLECTOMY  06/02/2009  . THYROIDECTOMY  11/06/2009  . TONSILLECTOMY AND ADENOIDECTOMY      SOCIAL HISTORY: Social History   Socioeconomic History  . Marital status: Married    Spouse name: Not on file  . Number of children: Not on file  . Years of education: Not on file  . Highest education level: Not on file  Occupational History  . Not on file  Tobacco Use  . Smoking status: Former Smoker    Quit date: 08/10/1960    Years since quitting: 59.4  .  Smokeless tobacco: Never Used  Substance and Sexual Activity  . Alcohol use: No    Alcohol/week: 0.0 standard drinks  . Drug use: No  . Sexual activity: Never  Other Topics Concern  . Not on file  Social History Narrative  . Not on file   Social Determinants of Health   Financial Resource Strain:   . Difficulty of Paying Living Expenses: Not on file  Food Insecurity:   . Worried About Charity fundraiser in the Last Year: Not on file  . Ran Out of Food in the Last Year: Not on file  Transportation Needs:   . Lack of Transportation (Medical): Not on file  . Lack of Transportation (Non-Medical): Not on file  Physical Activity:   . Days of Exercise per Week: Not on file  . Minutes of Exercise per Session: Not on file  Stress:   . Feeling of Stress : Not on file  Social Connections:   . Frequency of Communication with Friends and Family: Not  on file  . Frequency of Social Gatherings with Friends and Family: Not on file  . Attends Religious Services: Not on file  . Active Member of Clubs or Organizations: Not on file  . Attends Archivist Meetings: Not on file  . Marital Status: Not on file  Intimate Partner Violence:   . Fear of Current or Ex-Partner: Not on file  . Emotionally Abused: Not on file  . Physically Abused: Not on file  . Sexually Abused: Not on file    FAMILY HISTORY: Family History  Problem Relation Age of Onset  . Cancer Father        throat/ bone  . Breast cancer Sister   . Cancer Sister        MEDULAR CANCER  . Cancer Brother        prostate  . Cancer Other        MEDULAR CANCER  . Breast cancer Maternal Aunt   . Cancer Paternal Aunt        pancreatic   . Cancer Cousin        ON MOTHER'S SIDE- LIVER CANCER    ALLERGIES:  is allergic to codeine.  MEDICATIONS:  Current Outpatient Medications  Medication Sig Dispense Refill  . ALPRAZolam (XANAX) 1 MG tablet Take 0.5 tablets (0.5 mg total) by mouth 2 (two) times daily. 6 tablet 0  . Calcium Carbonate-Vit D-Min (CALCIUM 600 + MINERALS PO) Take 1 tablet by mouth daily.     . cetirizine (ZYRTEC) 10 MG tablet Take 10 mg by mouth daily.    . Cholecalciferol (VITAMIN D) 1000 UNITS capsule Take 1,000 Units by mouth daily.      . cholestyramine (QUESTRAN) 4 g packet Take 1 packet (4 g total) by mouth 2 (two) times daily. 180 each 3  . Cod Liver Oil 1000 MG CAPS Take 1,000 mg by mouth daily.     . Glucosamine-Chondroitin 250-200 MG CAPS Take 1 tablet by mouth daily. Name of table is SCHIFF    . levothyroxine (SYNTHROID) 125 MCG tablet Take 1 tablet (125 mcg total) by mouth daily. 90 tablet 3  . loperamide (IMODIUM A-D) 2 MG tablet Take 2 mg by mouth 3 (three) times daily.    . Multiple Vitamin (MULTIVITAMIN) tablet Take 1 tablet by mouth daily.      . simethicone (MYLICON) 80 MG chewable tablet Chew 80 mg by mouth 3 (three) times daily.     .  zoledronic  acid (RECLAST) 5 MG/100ML SOLN Inject 5 mg into the vein once.       No current facility-administered medications for this visit.    REVIEW OF SYSTEMS:   A 10+ POINT REVIEW OF SYSTEMS WAS OBTAINED including neurology, dermatology, psychiatry, cardiac, respiratory, lymph, extremities, GI, GU, Musculoskeletal, constitutional, breasts, reproductive, HEENT.  All pertinent positives are noted in the HPI.  All others are negative.   PHYSICAL EXAMINATION: ECOG PERFORMANCE STATUS: 1 - Symptomatic but completely ambulatory   Vitals:   01/06/20 0835  BP: 138/66  Pulse: 95  Resp: 18  Temp: 98 F (36.7 C)  TempSrc: Oral  SpO2: 97%  Weight: 96 lb 6.4 oz (43.7 kg)  Height: '5\' 3"'$  (1.6 m)     GENERAL:alert, in no acute distress and comfortable SKIN: no acute rashes, no significant lesions EYES: conjunctiva are pink and non-injected, sclera anicteric OROPHARYNX: MMM, no exudates, no oropharyngeal erythema or ulceration NECK: supple, no JVD LYMPH:  no palpable lymphadenopathy in the axillary or inguinal regions. Small 1 cm LN right cervical area.  LUNGS: clear to auscultation b/l with normal respiratory effort HEART: regular rate & rhythm ABDOMEN:  normoactive bowel sounds , non tender, not distended. No palpable hepatosplenomegaly.  Extremity: no pedal edema PSYCH: alert & oriented x 3 with fluent speech NEURO: no focal motor/sensory deficits   LABORATORY DATA:  I have reviewed the data as listed  . CBC Latest Ref Rng & Units 01/06/2020 06/14/2019 10/11/2018  WBC 4.0 - 10.5 K/uL 6.2 6.1 8.8  Hemoglobin 12.0 - 15.0 g/dL 13.8 13.6 12.8  Hematocrit 36.0 - 46.0 % 42.2 40.8 39.9  Platelets 150 - 400 K/uL 203 207 221  hgb 13.5  . CMP Latest Ref Rng & Units 01/06/2020 12/06/2019 07/15/2019  Glucose 70 - 99 mg/dL 87 82 -  BUN 8 - 23 mg/dL 15 16 -  Creatinine 0.44 - 1.00 mg/dL 0.75 0.82 -  Sodium 135 - 145 mmol/L 142 143 -  Potassium 3.5 - 5.1 mmol/L 4.7 3.8 -  Chloride 98 - 111  mmol/L 104 102 -  CO2 22 - 32 mmol/L 31 30 -  Calcium 8.9 - 10.3 mg/dL 9.5 9.5 9.6  Total Protein 6.5 - 8.1 g/dL 7.0 7.6 -  Total Bilirubin 0.3 - 1.2 mg/dL 0.8 1.0 -  Alkaline Phos 38 - 126 U/L 79 76 -  AST 15 - 41 U/L 22 23 -  ALT 0 - 44 U/L 20 22 -   02/12/18 Calcitonin:     PATHOLOGY  Lung Biopsy at [facility] 09/23/14 DIAGNOSIS A. Pleural nodule, biopsy:  Calcified and hyalinized remote fat necrosis. No evidence of malignancy.   B. Level 9 lymph node, biopsy:  Metastatic medullary thyroid carcinoma. See comment.  Comment:: A note is made of the patient's history of medullary thyroid carcinoma with documented prior metastatic disease to cervical lymph nodes ([id number]; 04/05/2010).  The prior metastases to cervical lymph nodes are reviewed in conjunction to the current case and the tumors are morphologically similar.   Confirmatory immunohistochemical stains are performed. Tumor cells stain positively for calcitonin and for an anticytokeratin cocktail. The morphologic and immunophenotypic findings support metastatic medullary thyroid carcinoma.   C. Level 8 lymph node, biopsy:  Profiles of lymph nodes, negative for malignancy.   D. Pleural nodule #2, biopsy:  Fibrovascular tissue with extensive cauterization. No evidence of malignancy.        Surgical Pathology at [facility] 11/08/12 Immunohistochemical Findings An immunohistochemical stain for calcitonin obtained on paraffin block  A1 is positive.   Diagnosis A. "RIGHT LEVEL 6 LYMPH NODES" (BIOPSY):  METASTATIC MEDULLARY THYROID CARCINOMA IN TWO LYMPH NODES (2/2). SIZE OF LARGEST METASTASIS:2 CM. EXTRANODAL INVASION:PRESENT. Comment:  I certify that I personally conducted the diagnostic evaluation of the above  specimen(s) and have rendered the above diagnosis(es). Rex C. Telford Nab, M.D. Telephone:[phone] Electronically signed: 11/16/12   A.  "Right level 6 lymph nodes", received fresh and placed in formalin on 1/9/ two fragments of tan-red soft tissue.Fragment 1 is 1.3 x 1 x 0.6 cm and is bisected and submitted in block A1.Fragment 2 is 2 x 1 x 0.4 cm and is submitted entirely in block A2.    RADIOGRAPHIC STUDIES: I have personally reviewed the radiological images as listed and agreed with the findings in the report. No results found.   CT Chest w Contrast w 3D MIPS Protocol at [facility] 09/29/17 Impression: 1.Mildly enlarged mediastinal lymph nodes are stable when compared to most recent CT. Although, several nodes are minimally increased from 03/08/2016.  2.Refer to separately dictated abdominal CT report for findings below the diaphragm.  Bone Scan Whole Body at [facility] 09/27/17 Impression: 1. Stable foci of increased radiotracer activity in the right frontal calvarium, indeterminate. 2. No new scintigraphic evidence of osseous metastasis.  US Lymph Node Neck Mapping at [facility] 08/29/17 Impression: 1.  Newly measured right level 5 lymph node (lymph node #5) with abnormal rounded morphology, no echogenic hilum, and microcalcifications. Although biopsy of this nodule may be technically difficult due to its location deep to the carotid vasculature, ultrasound-guided biopsy is recommended (if possible).  2.  Newly measured right level 2A lymph node without suspicious features.  3.  Additional right neck nodes are unchanged.   Bone Density Scan 02/27/17 @ [facility]  Lowest Site Measured: Total Left hip with T-score of -3.1 and BMD of 0.568   CT CAP W Contrast W MIPS at [facility] 02/13/17 Impression: 1. Please see CT neck completed same day for findings in the neck. 2. Stable nonenlarged mediastinal lymph nodes and soft tissue nodule in the right thyroidectomy bed. 3. Unchanged irregular opacities in the lung apices bilaterally, which may represent treated metastases or scarring. No new metastatic disease in the chest,  abdomen, or pelvis.  CT Nect Soft Tissue W Contrast at [facility] 02/13/17 IMPRESSION: Numerous soft tissue enhancing lesions in the superior mediastinum. While these are stable when compared to most recent CT, several of these lesions demonstrate minimal growth from the more remote exam on 09/07/2015. No new enhancing lesions are present.  ASSESSMENT & PLAN:   SAHAANA WEITMAN is a 79 y.o. caucasian female with   1. 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  She had a level 6 LN dissection on 11/08/12 with 2/2 LN positive, largest 2cm for MTC.  09/05/14 CAP CT with development of new calcified nodules in the left lower lobe 09/23/14 Lung biopsy showed Level 9 lymph node, positive for malignancy and metastatic cancer which was resected. Treatment for symptomatic disease with vandetanib and cabozantibnib were previously discussed with her 89 Oncologist but the patient had decided to hold off on treatment due to concerns for toxicities and lack of overt cancer related progressive symptoms. Latest scans from 08/2017 are stable, no disease found below chest or in the bones. Her calcitonin levels have been increasing since 2015. Overall pt currently asymptomatic.   CT  chest on 10/17/2018 which revealed "1. Unchanged mediastinal and right supraclavicular lymphadenopathy. Findings are nonspecific, however metastatic disease is not excluded. 2. Stable biapical scarring and right apical consolidative focus with associated calcification, likely postinfectious in nature."  10/11/2018 CT angiography chest w contrast revealed "1. No demonstrable pulmonary embolus. No thoracic aortic aneurysm or dissection. There are foci of aortic atherosclerosis as well as great vessel and coronary artery calcifications. 2. Fibrosis and cicatrization in the  apices with apical pleural calcification, likely due to scarring. There is focal airspace opacity which is asymmetric in the apical segment of the right upper lobe. This finding may well be due to chronic scarring. A potential focus of pneumonia or possible neoplastic involvement in this area must be of concern. This finding may well warrant correlation with nuclear medicine PET study to assess for abnormal metabolic activity. At a minimum, a follow-up chest CT in 3 months to assess for stability of this area would be advised. No other airspace consolidation noted. Areas of patchy atelectasis noted. No well-defined nodular type lesions noted in the lung parenchyma. 3. Probable sclerotic bony metastases at C3 and L1. Sclerotic focus at L1 is incompletely visualized. Medullary thyroid carcinoma as well as breast carcinoma potentially may present with sclerotic as opposed to lytic metastases. 4.  No demonstrable thoracic adenopathy. 5. Extensive postoperative change in the thyroid region with evidence of total thyroidectomy. 6. Mild reflux into the inferior vena cava and hepatic veins suggests increase in right heart pressure. Aortic Atherosclerosis (ICD10-I70.0)."  12/06/2019 CT C/A/P ([id number]) ([id number]) revealed "1. Stable exam. No new or progressive findings to suggest progressive metastatic disease. 2. Scattered sclerotic bone lesions, similar to prior. 3. Stable mild intrahepatic and extrahepatic biliary duct dilatation. 4. Trace free fluid in the cul-de-sac."  12/06/2019 CT Soft Tissue Neck ([id number]) revealed "1. Thyroidectomy with nodal metastases at the thoracic inlet/anterior compartment and right supraclavicular fossa. There are no prior neck CT comparison is available for review. 2. Spinal osseous metastatic disease without visible extraosseous tumor extension."  2. 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 -Secondary to hypercalcitoninemia  -Currently on imodium  daily, cholestyramine twice daily  -was previously on monthly Sandostatin injections in 10/20/17- but patient decided to hold citing no real benefit from adding this -Her last colonoscopy was in 2010  PLAN:  -Discussed pt labwork today, 01/06/20; blood counts and chemistries are nml, Magnesium is WNL, CEA is stable at 39.02 -Discussed Calcitonin is in progress - will f/u as appropriate  -Discussed 12/06/2019 CT C/A/P ([id number]) ([id number]) which revealed stable disease, no progressive findings.  -Discussed 12/06/2019 CT Soft Tissue Neck ([id number]) which revealed few disease findings but no previous studies for comparison. -No overt clinical or symptomatic progression to indicate the need for complex treatment options such as vandetanib and cabozantibnib at this time -Will continue to monitor with labs, scans, and clinic visits  -Recommend pt f/u with her PCP for acid reflux management/GI referral  -Will get rpt CT C/A/P and labs in 24 weeks  -Will see back in 6 months    3. Osteoporosis - Last DEXA 02/27/17 with Lowest Site Measured: Total Left hip with T-score of -3.1 and BMD of 0.568 Closed left hip fracture from fall -s/p surgery on 10/23/17 with rod placement - healing well.  -She takes Reclast injections for her osteoporosis. Managed by her PCP   4. Chronic oropharyngeal Dysphagia ? Related to previous neck surgery. Dry mouth etc. No acute changes  Plan -ENT referral  for further evaluation of this since patient notes it is bothersome -discussed input regarding mx of dry mouth.  FOLLOW UP: Labs and CT chest/abd/pelvis in 24 weeks RTC with Dr Irene Limbo in 6 months   The total time spent in the appt was 20 minutes and more than 50% was on counseling and direct patient cares.  All of the patient's questions were answered with apparent satisfaction. The patient knows to call the clinic with any problems, questions or concerns.   Sullivan Lone MD [place] [facility]  [facility] Hematology/Oncology Physician [facility]  (Office):       [phone] (Work cell):  [phone] (Fax):           [phone]  01/06/2020 10:08 AM  I, Yevette Edwards, am acting as a scribe for Dr. Sullivan Lone.   .I have reviewed the above documentation for accuracy and completeness, and I agree with the above. Brunetta Genera MD

## 2020-01-07 LAB — CALCITONIN: Calcitonin: 28703 pg/mL — ABNORMAL HIGH (ref 0.0–5.0)

## 2020-01-09 ENCOUNTER — Telehealth: Payer: Self-pay | Admitting: Hematology

## 2020-01-09 ENCOUNTER — Other Ambulatory Visit: Payer: Self-pay

## 2020-01-09 NOTE — Telephone Encounter (Signed)
Scheduled per 03/08 los, patient has been called and notified.

## 2020-01-13 ENCOUNTER — Ambulatory Visit: Payer: Medicare Other | Admitting: Internal Medicine

## 2020-01-13 ENCOUNTER — Encounter: Payer: Self-pay | Admitting: Internal Medicine

## 2020-01-13 ENCOUNTER — Other Ambulatory Visit: Payer: Self-pay

## 2020-01-13 VITALS — BP 138/62 | HR 98 | Temp 98.1°F | Ht 63.0 in | Wt 95.8 lb

## 2020-01-13 DIAGNOSIS — E3122 Multiple endocrine neoplasia [MEN] type IIA: Secondary | ICD-10-CM

## 2020-01-13 DIAGNOSIS — E890 Postprocedural hypothyroidism: Secondary | ICD-10-CM

## 2020-01-13 DIAGNOSIS — L989 Disorder of the skin and subcutaneous tissue, unspecified: Secondary | ICD-10-CM

## 2020-01-13 DIAGNOSIS — C801 Malignant (primary) neoplasm, unspecified: Secondary | ICD-10-CM | POA: Diagnosis not present

## 2020-01-13 NOTE — Progress Notes (Signed)
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 LESLEYANNE POLITTE is a 79 y.o., female with a past medical history of MEN2, osteoporosis and post-surgical hypothyroidism. She has followed with [facility] since 07/15/2019 for consultative assistance with management of her MEN2A.   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   S/P B/L neck dissection 03/2010 S/P redo dissection of the right level VI L.N in 10/2012, largest 2 cm due to mets In 2015  Had a stable right lateral compartment level IV and V L.N . The cystic right level IV L.N previously biopsied 12/2010 negative for mets.  S/P thoracoscopy (08/2014) level IX L.N with medullary mets.    She has had diarrhea in 2010, started on Imodium for years. Somatostatin was not found to be effective.   In 08/2018 was found to have an endometrial mass on MRI, further testing was benign.   She used to follow up at [facility], serial testing has come back negative for pheo or hypercalcemia.    Over the years her calcitonin and CEA levels have been gradually increasing.   She continues with chronic diarrhea, diarrhea has been there since 2010. Treatment was started some time in 2013. She has been on loperamide for years, she takes 2 tabs TID, which is above the max recommended dose.  She has 2-3 bowel movements a day, Loperamide is expensive due to quantity used , so we switched to Lomotil  Stool consistency has improved with consuming protein shakes.     She has been diagnosed with osteoporosis in 2016 and has been on Reclast since then.    SUBJECTIVE:   During last visit (07/15/2019): Increase Levothyroxine.   Today  (01/15/2020):  Ms. Antwi is here for f/u on medullary carcinoma.    Weight stable Was recently evaluated by ENT for hoarseness, laryngoscope was unrevealing. But continues with voice hoariness and dysphagia to certain foods.    She noted a right localized neck swelling a few months ago, has been stable, denies pain.    Stools have been normal lately, lomotil did not help.  She continues to use the imodium  She continues with protein shakes.    She is on calcium BID  Vitamin D 1000 iu daily    ROS:  As per HPI.   HISTORY:  Past Medical History:  Past Medical History:  Diagnosis Date  . Anxiety   . Breast cancer ([place]) 1999   left lumpectomy/radiation/ductal carcinoma in situ  . Cancer ([place])   . Colon polyp   . Contact lens/glasses fitting    HAS LENS IMPLANTS  . Lung cancer ([place]) 2015   LYMPHNODE REMOVAL   . 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 Osteoporosis    Past Surgical History:  Past Surgical History:  Procedure Laterality Date  . ABDOMINAL HYSTERECTOMY    . ABDOMINAL SURGERY  1989   RUPTURED APPENDIX  . BREAST SURGERY  1999   LEFT BREAST LUMPECTOMY. DUCTAL CARCINOMA IN SITU./ RADIATION  . CATARACT EXTRACTION W/ INTRAOCULAR LENS IMPLANT  2009   BOTH EYES  . FEMUR IM NAIL Left 10/23/2017   Procedure: INTRAMEDULLARY (IM) NAIL FEMORAL;  Surgeon: Alvan Dame,  Rodman Key, MD;  Location: [place];  Service: Orthopedics;  Laterality: Left;  . FOOT SURGERY  1070   RIGHT FOOT - PINCHED NERVE  . LUNG SURGERY     CALCIFICATIONS, POSITIVE - CANCER LYMPHNODE   . MELANOMA EXCISION  2008   CHEST AREA  . NECK SURGERY  Nov 09 2011   LYMPHNODES REMOVED 2 POSITIVE   . RIGHT COLECTOMY  06/02/2009  . THYROIDECTOMY  11/06/2009  . TONSILLECTOMY AND ADENOIDECTOMY      Social History:  reports that she quit smoking about 59 years ago. She has never used smokeless tobacco. She reports that she does not drink alcohol or use drugs. Family History:  Family History  Problem Relation  Age of Onset  . Cancer Father        throat/ bone  . Breast cancer Sister   . Cancer Sister        MEDULAR CANCER  . Cancer Brother        prostate  . Cancer Other        MEDULAR CANCER  . Breast cancer Maternal Aunt   . Cancer Paternal Aunt        pancreatic   . Cancer Cousin        ON MOTHER'S SIDE- LIVER CANCER     HOME MEDICATIONS: Allergies as of 01/13/2020      Reactions   Codeine Other (See Comments)   hyper      Medication List       Accurate as of January 13, 2020 11:59 PM. If you have any questions, ask your nurse or doctor.        ALPRAZolam 1 MG tablet Commonly known as: XANAX Take 0.5 tablets (0.5 mg total) by mouth 2 (two) times daily.   CALCIUM 600 + MINERALS PO Take 1 tablet by mouth daily.   cetirizine 10 MG tablet Commonly known as: ZYRTEC Take 10 mg by mouth daily.   cholestyramine 4 g packet Commonly known as: QUESTRAN Take 1 packet (4 g total) by mouth 2 (two) times daily.   Cod Liver Oil 1000 MG Caps Take 1,000 mg by mouth daily.   Glucosamine-Chondroitin 250-200 MG Caps Take 1 tablet by mouth daily. Name of table is SCHIFF   levothyroxine 125 MCG tablet Commonly known as: SYNTHROID Take 1 tablet (125 mcg total) by mouth daily.   loperamide 2 MG tablet Commonly known as: IMODIUM A-D Take 2 mg by mouth 3 (three) times daily.   multivitamin tablet Take 1 tablet by mouth daily.   Reclast 5 MG/100ML Soln injection Generic drug: zoledronic acid Inject 5 mg into the vein once.   simethicone 80 MG chewable tablet Commonly known as: MYLICON Chew 80 mg by mouth 3 (three) times daily.   Vitamin D 1000 units capsule Take 1,000 Units by mouth daily.         OBJECTIVE:   PHYSICAL EXAM: VS: BP 138/62 (BP Location: Right Arm, Patient Position: Sitting, Cuff Size: Normal)   Pulse 98   Temp 98.1 F (36.7 C)   Ht 5' 3"  (1.6 m)   Wt 95 lb 12.8 oz (43.5 kg)   SpO2 96%   BMI 16.97 kg/m    EXAM: General: Pt appears well and is  in NAD  Neck: General: Supple without adenopathy. Thyroid: Thyroid surgically removed, no adenopathy noted. She has a superficial rubbery round lesion at the right supraclavicular area   Lungs: Clear with good BS bilat with no rales, rhonchi, or wheezes  Heart: Auscultation: RRR.  Abdomen: Normoactive bowel sounds, soft, nontender, without masses or organomegaly palpable  Extremities:  BL LE: No pretibial edema normal ROM and strength.  Mental Status: Judgment, insight: Intact Orientation: Oriented to time, place, and person Mood and affect: No depression, anxiety, or agitation     DATA REVIEWED:   Results for YER, CASTELLO (MRN [id number]) as of 01/13/2020 10:30  Ref. Range 08/05/2019 17:13  Dopamine 24 Hr Urine Latest Ref Range: 52 - 480 mcg/24 h 163  Epinephrine, 24H, Ur Latest Units: mcg/24 h see note  Metanephrine, Ur Latest Ref Range: 224 - 832 mcg/24 h 554  Metanephrines, Ur Latest Ref Range: 90 - 315 mcg/24 h 205  Norepinephrine, 24H, Ur Latest Ref Range: 15 - 100 mcg/24 h 38  Total Volume Latest Units: mL 850  Creatinine, Urine mg/day-CATEUR Latest Ref Range: 0.50 - 2.15 g/24 h 0.51  Normetanephr.,U,24h Latest Ref Range: 122 - 676 mcg/24 h 349  Volume, Urine-VMAUR Latest Units: mL 850  Results for RAYSHAWN, VISCONTI (MRN [id number]) as of 01/13/2020 10:30  Ref. Range 01/06/2020 08:13  Sodium Latest Ref Range: 135 - 145 mmol/L 142  Potassium Latest Ref Range: 3.5 - 5.1 mmol/L 4.7  Chloride Latest Ref Range: 98 - 111 mmol/L 104  CO2 Latest Ref Range: 22 - 32 mmol/L 31  Glucose Latest Ref Range: 70 - 99 mg/dL 87  BUN Latest Ref Range: 8 - 23 mg/dL 15  Creatinine Latest Ref Range: 0.44 - 1.00 mg/dL 0.75  Calcium Latest Ref Range: 8.9 - 10.3 mg/dL 9.5  Anion gap Latest Ref Range: 5 - 15  7  Magnesium Latest Ref Range: 1.7 - 2.4 mg/dL 1.9  Alkaline Phosphatase Latest Ref Range: 38 - 126 U/L 79  Albumin Latest Ref Range: 3.5 - 5.0 g/dL 4.1  AST Latest Ref Range: 15 - 41 U/L 22  ALT  Latest Ref Range: 0 - 44 U/L 20  Total Protein Latest Ref Range: 6.5 - 8.1 g/dL 7.0  Total Bilirubin Latest Ref Range: 0.3 - 1.2 mg/dL 0.8  GFR, Est Non African American Latest Ref Range: >60 mL/min >60  GFR, Est African American Latest Ref Range: >60 mL/min >60   ASSESSMENT / PLAN / RECOMMENDATIONS:   1. MEN2A:  - Pt with medullary carcinoma ,  no clinical or biochemical evidence of pheochromocytoma or hypercalcemia   2. Medullary Carcinoma:   - S/P thyroidectomy in 2011 followed by dissection and redo of the right level VI L.N in 10/2012, she is also S/P thoracoscopy (08/2014) level IX L.N with medullary mets.  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 - We attempted to switch imodium to Lomotil but that did not help her, has been back on Imodium with good results - Pt has not been found to be a candidate for TKI 's , despite their role in improving progression-free survival compared to placebo but has not shown to improve overall survival.   3. Postoperative hypothyroidism :   - Pt is clinically and biochemically euthyroid  - Pt educated extensively on the correct way to take levothyroxine (first thing in the morning with water, 30 minutes before eating or taking other medications). - Pt encouraged to double dose the following day if she were to miss a dose given long half-life of levothyroxine.   Continue Levothyroxine 125 mcg daily   4. Skin Lesion :  - She has a rubbery round skin lesion  on the right supra- clavucular notch, does feel like a skin issues  ? Neuroma)rather then lymphadenopathy .    F/U in 6 months  Signed electronically by: Mack Guise, MD  [facility] Endocrinology  [facility] [address] [place], [place] [address] Phone: [phone] FAX: [phone]      CC: Amie Critchley [address] [place]  [address] Phone: [phone]  Fax: [phone]   Return to Endocrinology clinic as below: Future Appointments  Date Time Provider [place]  06/22/2020 10:30 AM [facility] ONLY [facility] None  06/22/2020 11:30 AM [facility] 2 [facility] [place]  07/02/2020  2:00 PM Brunetta Genera, MD [facility] None  07/15/2020 10:30 AM Josian Lanese, Melanie Crazier, MD [facility] None

## 2020-01-13 NOTE — Patient Instructions (Signed)

## 2020-01-15 ENCOUNTER — Encounter: Payer: Self-pay | Admitting: Internal Medicine

## 2020-01-15 DIAGNOSIS — L989 Disorder of the skin and subcutaneous tissue, unspecified: Secondary | ICD-10-CM | POA: Insufficient documentation

## 2020-02-05 ENCOUNTER — Other Ambulatory Visit: Payer: Self-pay | Admitting: Physician Assistant

## 2020-02-05 DIAGNOSIS — R1310 Dysphagia, unspecified: Secondary | ICD-10-CM

## 2020-02-17 ENCOUNTER — Other Ambulatory Visit: Payer: Self-pay | Admitting: Physician Assistant

## 2020-02-17 ENCOUNTER — Ambulatory Visit
Admission: RE | Admit: 2020-02-17 | Discharge: 2020-02-17 | Disposition: A | Discharge status: Home / Self Care | Payer: Medicare Other | Source: Ambulatory Visit | Attending: Physician Assistant | Admitting: Physician Assistant

## 2020-02-17 DIAGNOSIS — R1310 Dysphagia, unspecified: Secondary | ICD-10-CM

## 2020-03-12 ENCOUNTER — Other Ambulatory Visit (HOSPITAL_COMMUNITY): Payer: Self-pay | Admitting: *Deleted

## 2020-03-12 DIAGNOSIS — R1310 Dysphagia, unspecified: Secondary | ICD-10-CM

## 2020-03-19 ENCOUNTER — Ambulatory Visit (HOSPITAL_COMMUNITY)
Admission: RE | Admit: 2020-03-19 | Discharge: 2020-03-19 | Disposition: A | Discharge status: Home / Self Care | Payer: Medicare Other | Source: Ambulatory Visit | Attending: Physician Assistant | Admitting: Physician Assistant

## 2020-03-19 ENCOUNTER — Other Ambulatory Visit: Payer: Self-pay

## 2020-03-19 DIAGNOSIS — R1310 Dysphagia, unspecified: Secondary | ICD-10-CM | POA: Diagnosis not present

## 2020-03-19 NOTE — Progress Notes (Signed)
Modified Barium Swallow Progress Note  Patient Details  Name: Shannon [name] MRN: [id number] Date of Birth: [birth year]  Today's Date: 03/19/2020  Modified Barium Swallow completed.  Full report located under Chart Review in the Imaging Section.  Brief recommendations include the following:  Clinical Impression   Prior to PO administration she is observed to be mildly dysarthric; her lingual and palatal ROM are adequate, but her aritculation is distorted and her resonance is overtly hyponasal. She reprots an onset of these symptoms over the last few months, corresponding with difficulty swallowing. Fluoroscopy reveals a moderate oropharyngeal dysphagia with loss of tissue volume in the base of tongue and posterior pharyngeal wall with corresponding weakness. There is decreased pharyngeal constriciton for persitalsis, decreased propulsion of bolus and incomplete contact of the aryteniods to the the posterior epiglottis. These impairments lead to trace silent aspiration during the swallow, moderate vallecular and pyriform sinus residue and penetration of residue post swallow with decreased senastion. A cue to clear her throat and swallow again with effort were the best strategies. A  liquid wash is needed to clear solid residue. Positional strategies not beneficial. Recommend f/u with OP SLP and potential f/u with ENT or neurology to address etiology of deficits.    Swallow Evaluation Recommendations   Recommended Consults: Consider ENT evaluation   SLP Diet Recommendations: Regular solids;Thin liquid   Liquid Administration via: Cup;Straw   Medication Administration: Whole meds with puree   Supervision: Patient able to self feed   Compensations: Slow rate;Small sips/bites;Follow solids with liquid;Clear throat intermittently;Effortful swallow;Multiple dry swallows after each bite/sip   Postural Changes: Remain semi-upright after after feeds/meals (Comment);Seated upright at 90 degrees             Darcell Yacoub, Katherene Ponto 03/19/2020,4:00 PM

## 2020-06-03 ENCOUNTER — Telehealth: Payer: Self-pay | Admitting: Hematology

## 2020-06-03 NOTE — Telephone Encounter (Signed)
Rescheduled 09/02 appointment to 09/08 due to provider pal, patient has been called and notified.

## 2020-06-22 ENCOUNTER — Other Ambulatory Visit: Payer: Self-pay

## 2020-06-22 ENCOUNTER — Ambulatory Visit (HOSPITAL_COMMUNITY)
Admission: RE | Admit: 2020-06-22 | Discharge: 2020-06-22 | Disposition: A | Discharge status: Home / Self Care | Payer: Medicare Other | Source: Ambulatory Visit | Attending: Hematology | Admitting: Hematology

## 2020-06-22 ENCOUNTER — Inpatient Hospital Stay: Payer: Medicare Other | Attending: Hematology

## 2020-06-22 DIAGNOSIS — R197 Diarrhea, unspecified: Secondary | ICD-10-CM | POA: Insufficient documentation

## 2020-06-22 DIAGNOSIS — E070 Hypersecretion of calcitonin: Secondary | ICD-10-CM | POA: Diagnosis present

## 2020-06-22 DIAGNOSIS — Z85850 Personal history of malignant neoplasm of thyroid: Secondary | ICD-10-CM | POA: Insufficient documentation

## 2020-06-22 DIAGNOSIS — Z85118 Personal history of other malignant neoplasm of bronchus and lung: Secondary | ICD-10-CM | POA: Diagnosis not present

## 2020-06-22 DIAGNOSIS — R160 Hepatomegaly, not elsewhere classified: Secondary | ICD-10-CM | POA: Diagnosis not present

## 2020-06-22 DIAGNOSIS — I700 Atherosclerosis of aorta: Secondary | ICD-10-CM | POA: Insufficient documentation

## 2020-06-22 DIAGNOSIS — C73 Malignant neoplasm of thyroid gland: Secondary | ICD-10-CM | POA: Diagnosis not present

## 2020-06-22 DIAGNOSIS — M810 Age-related osteoporosis without current pathological fracture: Secondary | ICD-10-CM | POA: Diagnosis not present

## 2020-06-22 LAB — CMP (CANCER CENTER ONLY)
ALT: 17 U/L (ref 0–44)
AST: 22 U/L (ref 15–41)
Albumin: 4.2 g/dL (ref 3.5–5.0)
Alkaline Phosphatase: 90 U/L (ref 38–126)
Anion gap: 8 (ref 5–15)
BUN: 12 mg/dL (ref 8–23)
CO2: 29 mmol/L (ref 22–32)
Calcium: 10.1 mg/dL (ref 8.9–10.3)
Chloride: 102 mmol/L (ref 98–111)
Creatinine: 0.95 mg/dL (ref 0.44–1.00)
GFR, Est AFR Am: >60 mL/min (ref >60)
GFR, Estimated: 57 mL/min — ABNORMAL LOW (ref >60)
Glucose, Bld: 118 mg/dL — ABNORMAL HIGH (ref 70–99)
Potassium: 4.2 mmol/L (ref 3.5–5.1)
Sodium: 139 mmol/L (ref 135–145)
Total Bilirubin: 1.6 mg/dL — ABNORMAL HIGH (ref 0.3–1.2)
Total Protein: 7.3 g/dL (ref 6.5–8.1)

## 2020-06-22 LAB — CBC WITH DIFFERENTIAL/PLATELET
Abs Immature Granulocytes: 0.04 10*3/uL (ref 0.00–0.07)
Basophils Absolute: 0.1 10*3/uL (ref 0.0–0.1)
Basophils Relative: 0 %
Eosinophils Absolute: 0.1 10*3/uL (ref 0.0–0.5)
Eosinophils Relative: 0 %
HCT: 43.8 % (ref 36.0–46.0)
Hemoglobin: 14.5 g/dL (ref 12.0–15.0)
Immature Granulocytes: 0 %
Lymphocytes Relative: 4 %
Lymphs Abs: 0.6 10*3/uL — ABNORMAL LOW (ref 0.7–4.0)
MCH: 28.7 pg (ref 26.0–34.0)
MCHC: 33.1 g/dL (ref 30.0–36.0)
MCV: 86.6 fL (ref 80.0–100.0)
Monocytes Absolute: 0.8 10*3/uL (ref 0.1–1.0)
Monocytes Relative: 6 %
Neutro Abs: 13 10*3/uL — ABNORMAL HIGH (ref 1.7–7.7)
Neutrophils Relative %: 90 %
Platelets: 201 10*3/uL (ref 150–400)
RBC: 5.06 MIL/uL (ref 3.87–5.11)
RDW: 13.3 % (ref 11.5–15.5)
WBC: 14.5 10*3/uL — ABNORMAL HIGH (ref 4.0–10.5)
nRBC: 0 % (ref 0.0–0.2)

## 2020-06-22 LAB — CEA (IN HOUSE-CHCC): CEA (CHCC-In House): 62.11 ng/mL — ABNORMAL HIGH (ref 0.00–5.00)

## 2020-06-22 MED ORDER — SODIUM CHLORIDE (PF) 0.9 % IJ SOLN
INTRAMUSCULAR | Status: AC
  Filled 2020-06-22: qty 50

## 2020-06-22 MED ORDER — IOHEXOL 300 MG/ML  SOLN
75.0000 mL | Freq: Once | INTRAMUSCULAR | Status: AC | PRN
  Administered 2020-06-22: 75 mL via INTRAVENOUS

## 2020-06-23 LAB — CALCITONIN: Calcitonin: 39215 pg/mL — ABNORMAL HIGH (ref 0.0–5.0)

## 2020-06-23 IMAGING — RF DG ESOPHAGUS
2 series · 8 of 8 positions shown · non-contrast
Comparison: None.

CLINICAL DATA: Dysphasia

EXAM:
ESOPHOGRAM / BARIUM SWALLOW / BARIUM TABLET STUDY
TECHNIQUE: Combined double contrast and single contrast examination performed
using effervescent crystals, thick barium liquid, and thin barium
liquid. The patient was observed with fluoroscopy swallowing a 13 mm
barium sulphate tablet.
FLUOROSCOPY TIME:  Fluoroscopy Time:  1 minutes 24 seconds
Radiation Exposure Index (if provided by the fluoroscopic device):
Not available
Number of Acquired Spot Images: 0

[Series 1: one shot · 4 of 4 slices shown]
[im 1/4]
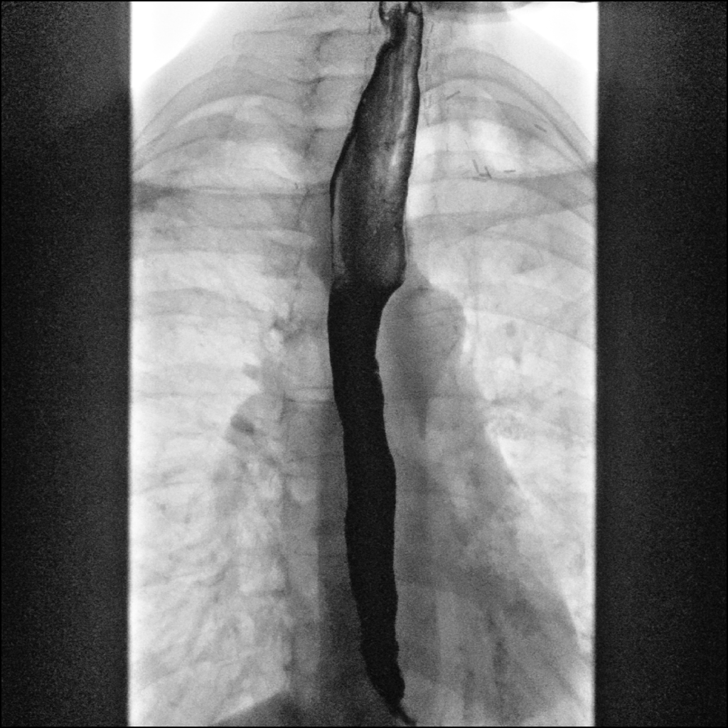
[im 2/4]
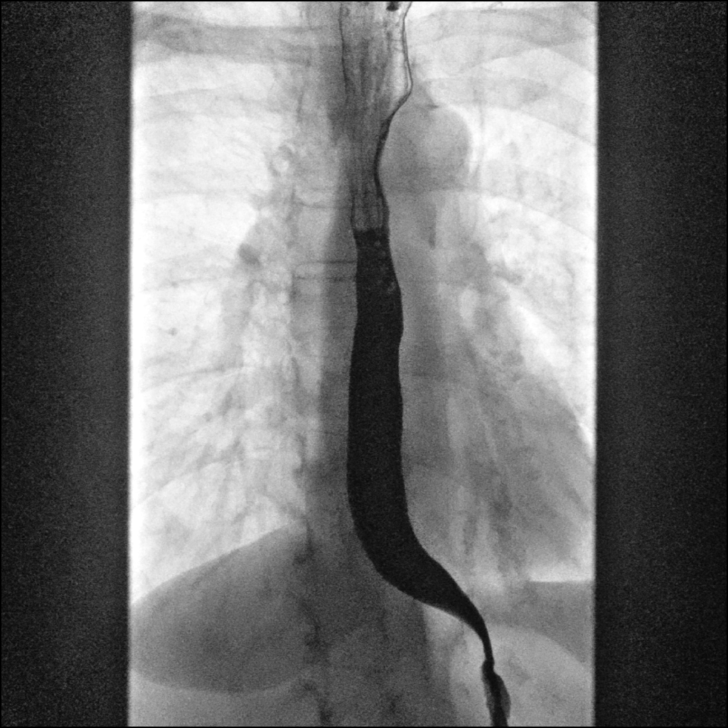
[im 3/4]
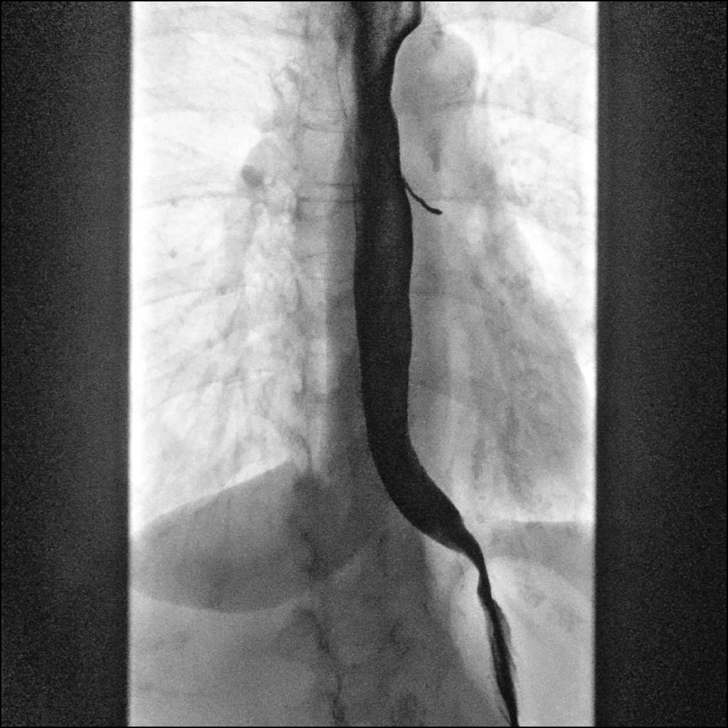
[im 4/4]
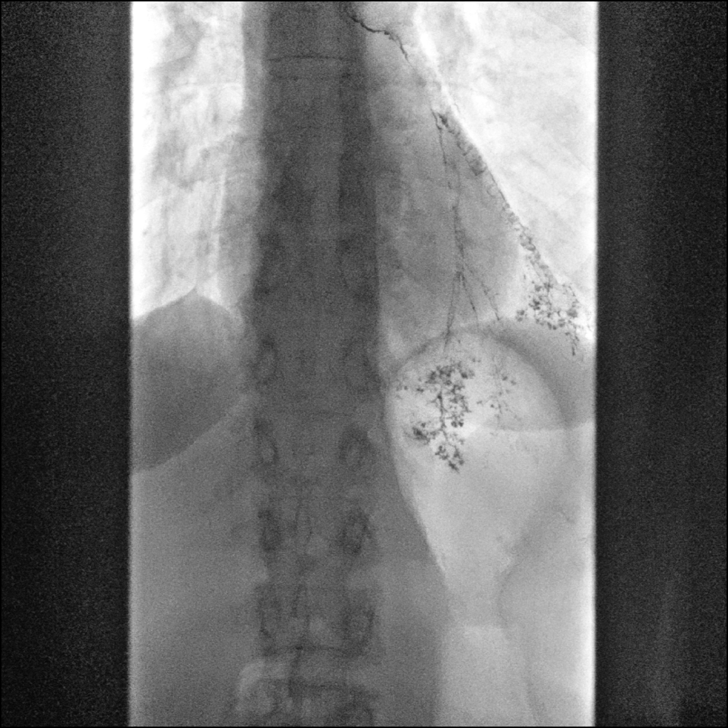

[Series 2: sequence · 4 of 53 frames shown]
[frame 8/53]
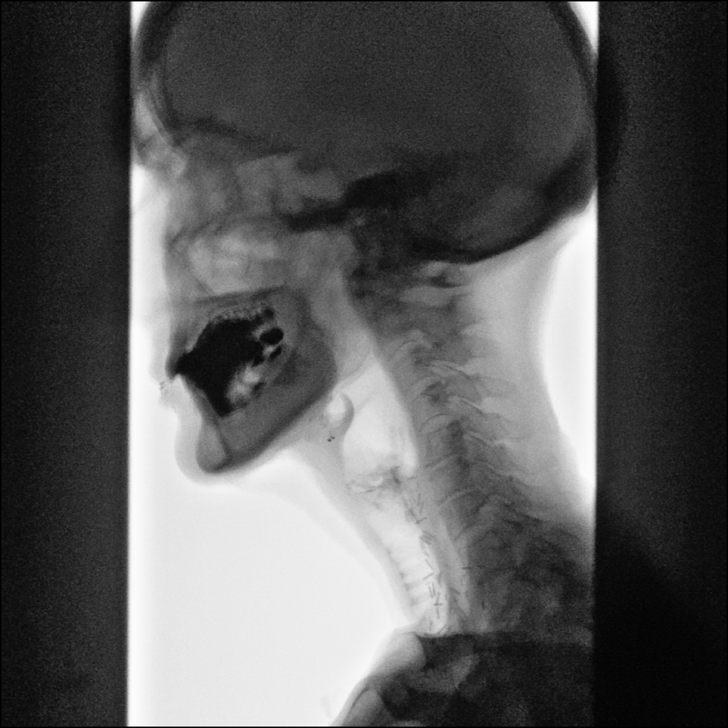
[frame 14/53]
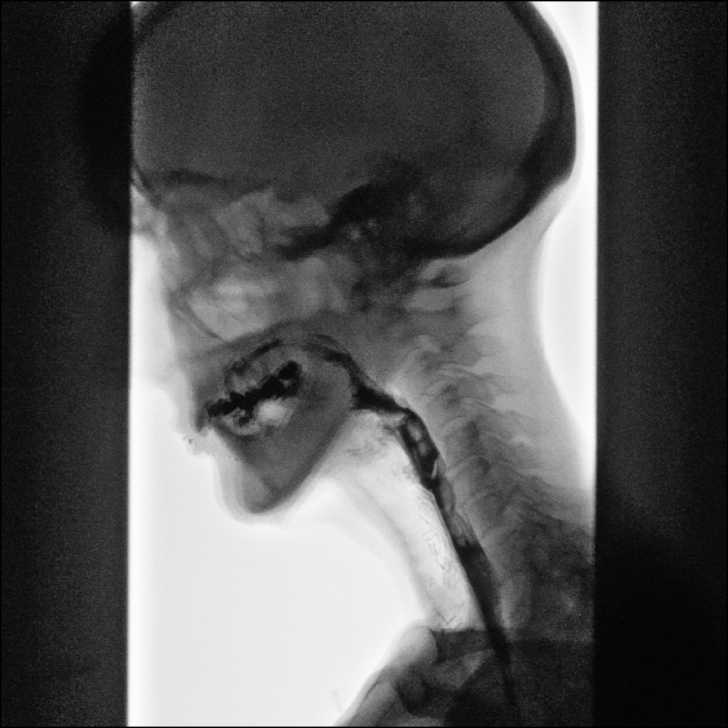
[frame 27/53]
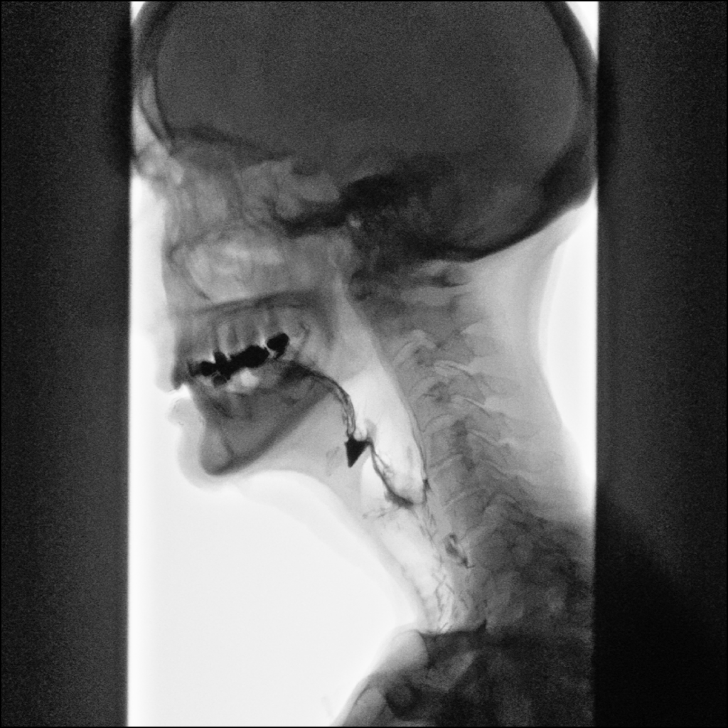
[frame 46/53]
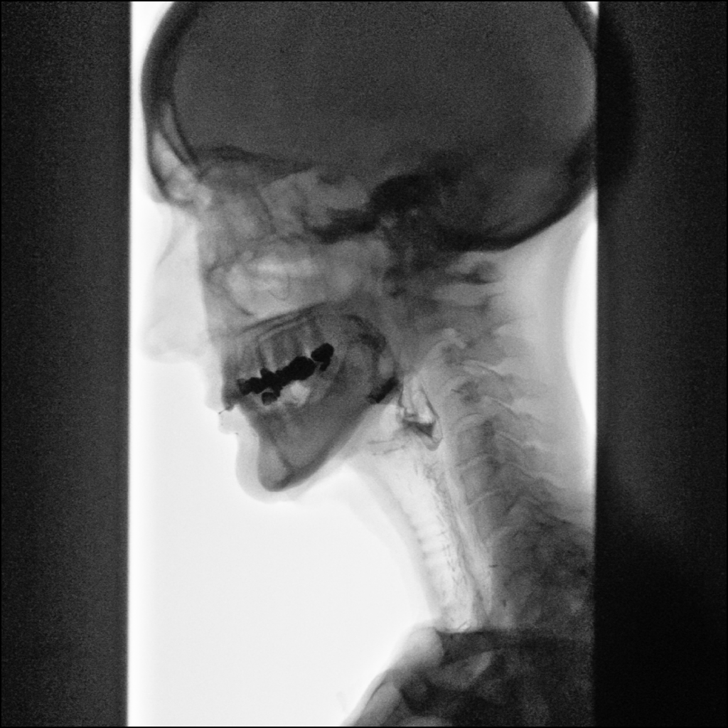

[8 of 8 positions shown; findings below may reference images not displayed]

FINDINGS: Following the ingestion of the effervescent crystals the patient
swallowed thick barium. A small portion of this was immediately
aspirated into the left lower lobe. The remainder of the barium
easily passed through the esophagus and into the stomach. No
high-grade stricture or mass noted. A barium tablet was been
administered which became lodged within the vallecula. Using a chin
tuck maneuver the tablet eventually passed into the esophagus and
then easily moved into the stomach.
IMPRESSION: 1. Patent esophagus without evidence for stricture or mass.
2. Aspiration. Consider further evaluation with speech pathology
oropharyngeal motility study.

## 2020-07-02 ENCOUNTER — Inpatient Hospital Stay: Payer: Medicare Other | Admitting: Hematology

## 2020-07-03 ENCOUNTER — Ambulatory Visit: Payer: Medicare Other | Admitting: Hematology

## 2020-07-06 ENCOUNTER — Other Ambulatory Visit: Payer: Self-pay | Admitting: Internal Medicine

## 2020-07-08 ENCOUNTER — Ambulatory Visit: Payer: Medicare Other | Admitting: Hematology

## 2020-07-08 NOTE — Progress Notes (Signed)
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 total thyroidectomy in January of 2011 and bilateral neck dissection in June of 2011.  B. Persistent hypercalcitoninemia postoperatively. Calcitonin 499 in 07/2011  C. December 2012: CT of neck/chest was negative for metastases D. She had a normal neck ultrasound in 01/2012 showing her cystic LN was smaller (the one biopsied and negative).  E. FDG PET 08/2012 due to her last calcitonin raising substantially to 739 from the 500s: concerning right cervical lymph node F. 11/08/2012: Level 6 LN dissection. She had 2/2 LN positive, largest 2cm for MTC. Calcitonin one week later from her surgery was down to 515.  G. Calcitonin 592 on 05/24/2013. Repeat ultrasound on that same day which showed 2 at least partially cystic lymph nodes at level 4 which had been previously biopsied and negative; decreased in size since 2012. It also showed a subcentimeter right level VB LN of low suspicion. H. Her metanephrines (plasma) and PTH were normal in 12/2013 Urine studies for pheo were negative in 12/2013. I. 08/04/2014: CEA 9.0; calcitonin 1019 J. 09/05/2014:  - C/A/P CT scan: Interval development of a new calcified nodule in the left lower lobe. - Bone scan: Slight asymmetric increased radiotracer uptake in the right lateral calvarium on the frontal radiograph - Neck U/S: Stable right lateral compartment level 4 and 5B lymph nodes. The cystic right level 4 lymph node, previously biopsied on 01/14/2011 with negative pathology results.  K. 09/23/2014: Thoracoscopy: level 9 LN: Metastatic medullary thyroid carcinoma.  L. 03/11/2015:  CEA 15.9; calcitonin 1149 M 04-13-2015:  - C/A/P CT scan: Previously reported left lower lobe pulmonary nodule is no longer identified. No new or enlarging pulmonary nodules. No evidence of metastatic disease in the abdomen or pelvis. - CT brain:No acute intracranial abnormalities.  New small CSF density subdural fluid collection overlying the right cerebral hemisphere, likely a small hygroma of uncertain etiology. There is no associated bony or parenchymal lesion. 04-13-2015: Bone scan: A non-specific, very small focus of increased tracer activity in the right frontal calvarium is not significantly changed from the prior study. - Neck U/S: There is hypoechoic lesion with questionable microcalcification located in right level IV, it is 0.4 x 0.6 x 0.9 cm. No other pathologic cervical lymphadenopathy seen.  N. 04/13/2015: CEA 14.6; calcitonin 1324 O. 07/16/2015 ([facility]): CEA 19.4 (outsode of [facility]); CAlcitonin 1860. P. 09-07-2015: Neck, Chest , Abd, [place] CT: Stable post-thyroidectomy changes without evidence of recurrent or metastatic disease. Stable pulmonary nodules. Hepatic cysts. No bone mets. CEA: 16; Calcitonin 1533 Q. 03-08-2016: C/A/P CT scan: No definite evidence of metastatic disease in the chest, abdomen or pelvis. Compared to 09/07/2015, there is a new linear 3 mm right lower lobe pulmonary nodule which is indeterminate and may represent focal atelectasis CEA 16.5; CAlcitonin 1839  R. 08-22-2016: Calcitonin: 4596; CEA 27.1  S. 09-06-2016:  - Bone scan: Redemonstrated likely two foci of increased radiotracer uptake within the right calvarium, similar to prior study. - Neck CT: No evidence of recurrent or metastatic disease in the neck. - C/A/P CT: No definite evidence of metastatic disease in the chest, abdomen, or pelvis. - Neck CT: Status post thyroidectomy  and lymph node dissection without evidence of recurrent or metastatic disease.  T. Bone scan: A focus of increased tracer activity  projecting over the posterior right approximate 8th rib is non-specific; this finding may relate to summation of tracer activity in the rib with the inferior tip of the overlying right scapula, post-traumatic change or skeletal metastasis; recommend attention on follow-up. Re-demonstration of subtly increased tracer activity in the right frontal calvarium, stable since 09/05/2014. U. 02-13-2017:  - C/A/P CT scan: nonenlarged mediastinal lymph nodes and soft tissue nodule in the right thyroidectomy bed. Unchanged irregular opacities in the lung apices bilaterally, which may represent treated metastases or scarring.  - Neck CT: Numerous soft tissue enhancing lesions in the superior mediastinum. While these are stable when compared to most recent CT, several of these lesions demonstrate minimal growth from the more remote exam on 09/07/2015. No new enhancing lesions are present. - CEA 25.1, Calcitonin: 5498 V. 08-28-2017: fractionate plasma free metanephrines: Normetanephrine 178 (24hr urine pending as this was thought to be non-specific), Metanephrine: 44 (normal). CEA 46; Calcitonin 9763 W. 09-27-2017: Bone scan: Stable increased radiotracer activity in the right frontal calvarium, indeterminate. No other evidence of osseous metastasis Chest CT/Abd: Mildly enlarged mediastinal lymph nodes are stable. No evidence of metastatic disease in the abdomen   HISTORY OF PRESENTING ILLNESS:   Shannon [name] is a wonderful 79 y.o. female who has been referred to [place] by [place] oncologist Dr. Leslie Andrea for management of Metastatic Medullary Thyroid Cancer. She is looking for a Financial controller in [place].  Today she is accompanied by her sister. Patient notes this started with a nodule in her throat and her endocrinologist started further work up. She has been seeing Med onc at [facility] since her first surgery every 3-6 months.   She has had thyroid surgery and Ldissection but no systemic therapy as  detailed above in the oncologic history.  She notes her family had testing for MEN2A mutation. Her sister, her nephew carry the gene. She notes her maternal aunt had goiters but not sure of the cause.   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 She has had diarrhea since 2010. Her last colonoscopy was in 2010. She has tried multiple treatment options but this continues to worsen over time. She currently takes imodium 4-6 X daily, cholestyramine twice daily. She started monthly Sandostatin on 10/20/17. She fell and broke her hip on 10/23/17 and had surgery same day. She was given hydrocodone which helped her diarrhea and is not sure if the Sandostatin is working for her. She has been on oral iron and she is on aspirin 37m since her hip surgery. She does PT exercises and has been healing well.    She recently stopped caffeine tablets that she was taking for energy. She stopped 3 months ago. She is on Reclast for her osteoporosis, which is managed by her PCP, Dr. KDeatra Ina Pt notes she stopped smoking about 40 years ago and she rarely drinks alcohol.   On review of symptoms, pt notes continued diarrhea, stable energy, weight loss of 6-8 pounds in the last 6 months. Her appetite is adequate. Her diarrhea occurs currently twice a day with "pudding" form to liquid form stool. She can go up to 5-7 times a day at times. She denies cramping with diarrhea. She denies blood, and mucus in stool. Current use of oral iron causes some black stool, which was not present before use.   INTERVAL HISTORY:  Joell Buerger returns today regarding her Metastatic Medullary Thyroid Cancer. The patient's last visit with [place] was on 01/06/2020. The pt reports that she is doing well overall.  The pt reports that her diarrhea has been well-managed with Imodium & Cholstyramine. She has lost a significant amount of weight and has not been able to eat well. Pt is using protein shakes occasionally. Pt  has reflux and was placed on several medications by her Gastroenterologist. The first two of these medications exacerbated her diarrhea and the third did not help her symptoms.   Pt denies any infections in the interim or any coughing during meals. Pt saw an ENT prior to her swallow study, who found that her sinuses were clear. She has not been to see a Electrical engineer since her swallow study.   She notes that she has had ear and nasal congestion since Spring. She has been using a nasal spray, which helps for a short time.   Of note since the patient's last visit, pt has had CT C/A/P ([id number]) ([id number]) completed on 06/22/2020 with results revealing "1. Borderline enlarged supraclavicular and mediastinal lymph nodes, stable. 2. Osseous metastatic disease, stable. 3. Left lower lobe aspiration and trace left pleural fluid. 4. Hepatomegaly. 5. Aortic atherosclerosis (ICD10-I70.0). Coronary artery calcification."  Of note since the patient's last visit, pt has had XR Swallow Func ([id number]) completed on 03/19/2020 with results revealing "Prior to PO administration she is observed to be mildly dysarthric; her lingual and palatal ROM are adequate, but her aritculation is distorted and her resonance is overtly hyponasal. She reports an onset of these symptoms over the last few months, corresponding with difficulty swallowing. Fluoroscopy reveals a moderate oropharyngeal dysphagia with loss of tissue volume in the base of tongue and posterior pharyngeal wall with corresponding weakness. There is decreased pharyngeal constriction for persitalsis, decreased propulsion of bolus and incomplete contact of the arytenoids to the the posterior epiglottis for laryngeal closure. These impairments lead to trace silent aspiration during the swallow, moderate vallecular and pyriform sinus residue and penetration of residue post swallow with decreased senastion. A cue to clear her throat and swallow again with effort  were the best strategies. A  liquid wash is needed to clear solid residue. Positional strategies not beneficial. Recommend f/u with OP SLP and potential f/u with ENT or neurology to address etiology of deficits."  Of note since the patient's last visit, pt has had XR Esophagus ([id number]) completed on 02/17/2020 with results revealing "1. Patent esophagus without evidence for stricture or mass. 2. Aspiration. Consider further evaluation with speech pathology oropharyngeal motility study."  Lab results (06/22/20) of CBC w/diff and CMP is as follows: all values are WNL except for WBC at 14.5K, Neutro Abs at 13.0K, Lymphs Abs at 0.6K, Glucose at 118, Total Bilirubin at 1.6, GFR Est Non Af Am at 57. 06/22/2020 CEA at 62.11 06/22/2020 Calcitonin at 39215.0  On review of systems, pt reports vocal changes, reflux, weight loss, congestion, dysphagia and denies SOB, fever, worsening diarrhea, abdominal pain, new lumps/bumps, leg swelling and any other symptoms.   MEDICAL HISTORY:  Past Medical History:  Diagnosis Date  . Anxiety   . Breast cancer ([place]) 1999   left lumpectomy/radiation/ductal carcinoma in situ  . Cancer ([place])   . Colon polyp   . Contact lens/glasses fitting    HAS LENS IMPLANTS  . Lung cancer ([place]) 2015   LYMPHNODE REMOVAL   . Medullary carcinoma of thyroid ([place]) 2011  . Osteoporosis  SURGICAL HISTORY: Past Surgical History:  Procedure Laterality Date  . ABDOMINAL HYSTERECTOMY    . ABDOMINAL SURGERY  1989   RUPTURED APPENDIX  . BREAST SURGERY  1999   LEFT BREAST LUMPECTOMY. DUCTAL CARCINOMA IN SITU./ RADIATION  . CATARACT EXTRACTION W/ INTRAOCULAR LENS IMPLANT  2009   BOTH EYES  . FEMUR IM NAIL Left 10/23/2017   Procedure: INTRAMEDULLARY (IM) NAIL FEMORAL;  Surgeon: Paralee Cancel, MD;  Location: [facility];  Service: Orthopedics;  Laterality: Left;  . FOOT SURGERY  1070   RIGHT FOOT - PINCHED NERVE  . LUNG SURGERY     CALCIFICATIONS, POSITIVE - CANCER LYMPHNODE   .  MELANOMA EXCISION  2008   CHEST AREA  . NECK SURGERY  Nov 09 2011   LYMPHNODES REMOVED 2 POSITIVE   . RIGHT COLECTOMY  06/02/2009  . THYROIDECTOMY  11/06/2009  . TONSILLECTOMY AND ADENOIDECTOMY      SOCIAL HISTORY: Social History   Socioeconomic History  . Marital status: Married    Spouse name: Not on file  . Number of children: Not on file  . Years of education: Not on file  . Highest education level: Not on file  Occupational History  . Not on file  Tobacco Use  . Smoking status: Former Smoker    Quit date: 08/10/1960    Years since quitting: 59.9  . Smokeless tobacco: Never Used  Vaping Use  . Vaping Use: Never used  Substance and Sexual Activity  . Alcohol use: No    Alcohol/week: 0.0 standard drinks  . Drug use: No  . Sexual activity: Never  Other Topics Concern  . Not on file  Social History Narrative  . Not on file   Social Determinants of Health   Financial Resource Strain:   . Difficulty of Paying Living Expenses: Not on file  Food Insecurity:   . Worried About Charity fundraiser in the Last Year: Not on file  . Ran Out of Food in the Last Year: Not on file  Transportation Needs:   . Lack of Transportation (Medical): Not on file  . Lack of Transportation (Non-Medical): Not on file  Physical Activity:   . Days of Exercise per Week: Not on file  . Minutes of Exercise per Session: Not on file  Stress:   . Feeling of Stress : Not on file  Social Connections:   . Frequency of Communication with Friends and Family: Not on file  . Frequency of Social Gatherings with Friends and Family: Not on file  . Attends Religious Services: Not on file  . Active Member of Clubs or Organizations: Not on file  . Attends Archivist Meetings: Not on file  . Marital Status: Not on file  Intimate Partner Violence:   . Fear of Current or Ex-Partner: Not on file  . Emotionally Abused: Not on file  . Physically Abused: Not on file  . Sexually Abused: Not on file     FAMILY HISTORY: Family History  Problem Relation Age of Onset  . Cancer Father        throat/ bone  . Breast cancer Sister   . Cancer Sister        MEDULAR CANCER  . Cancer Brother        prostate  . Cancer Other        MEDULAR CANCER  . Breast cancer Maternal Aunt   . Cancer Paternal Aunt        pancreatic   .  Cancer Cousin        ON MOTHER'S SIDE- LIVER CANCER    ALLERGIES:  is allergic to codeine.  MEDICATIONS:  Current Outpatient Medications  Medication Sig Dispense Refill  . ALPRAZolam (XANAX) 1 MG tablet Take 0.5 tablets (0.5 mg total) by mouth 2 (two) times daily. 6 tablet 0  . amoxicillin-clavulanate (AUGMENTIN) 875-125 MG tablet Take 1 tablet by mouth 2 (two) times daily for 5 days. 10 tablet 0  . Calcium Carbonate-Vit D-Min (CALCIUM 600 + MINERALS PO) Take 1 tablet by mouth daily.     . cetirizine (ZYRTEC) 10 MG tablet Take 10 mg by mouth daily.    . Cholecalciferol (VITAMIN D) 1000 UNITS capsule Take 1,000 Units by mouth daily.      . cholestyramine (QUESTRAN) 4 g packet Take 1 packet (4 g total) by mouth 2 (two) times daily. 180 each 3  . Cod Liver Oil 1000 MG CAPS Take 1,000 mg by mouth daily.     . Glucosamine-Chondroitin 250-200 MG CAPS Take 1 tablet by mouth daily. Name of table is SCHIFF    . levothyroxine (SYNTHROID) 125 MCG tablet Take 1 tablet by mouth once daily 90 tablet 0  . loperamide (IMODIUM A-D) 2 MG tablet Take 2 mg by mouth 3 (three) times daily.    . Multiple Vitamin (MULTIVITAMIN) tablet Take 1 tablet by mouth daily.      . simethicone (MYLICON) 80 MG chewable tablet Chew 80 mg by mouth 3 (three) times daily.     . zoledronic acid (RECLAST) 5 MG/100ML SOLN Inject 5 mg into the vein once.       No current facility-administered medications for this visit.    REVIEW OF SYSTEMS:   A 10+ POINT REVIEW OF SYSTEMS WAS OBTAINED including neurology, dermatology, psychiatry, cardiac, respiratory, lymph, extremities, GI, GU, Musculoskeletal,  constitutional, breasts, reproductive, HEENT.  All pertinent positives are noted in the HPI.  All others are negative.   PHYSICAL EXAMINATION: ECOG PERFORMANCE STATUS: 1 - Symptomatic but completely ambulatory   Vitals:   07/09/20 1028  BP: 127/64  Pulse: (!) 101  Resp: 18  Temp: (!) 97.5 F (36.4 C)  TempSrc: Tympanic  SpO2: 99%  Weight: 85 lb 3.2 oz (38.6 kg)  Height: $Remove'5\' 3"'kLyCcqW$  (1.6 m)     Exam was given in a chair   GENERAL:alert, in no acute distress and comfortable SKIN: no acute rashes, no significant lesions EYES: conjunctiva are pink and non-injected, sclera anicteric OROPHARYNX: MMM, no exudates, no oropharyngeal erythema or ulceration NECK: supple, no JVD LYMPH:  no palpable lymphadenopathy in the cervical, axillary or inguinal regions. LUNGS: clear to auscultation b/l with normal respiratory effort HEART: regular rate & rhythm ABDOMEN:  normoactive bowel sounds , non tender, not distended. No palpable splenomegaly. Hepatomegaly 2-3 finger breadths below the costal margin.  Extremity: no pedal edema PSYCH: alert & oriented x 3 with fluent speech NEURO: no focal motor/sensory deficits  LABORATORY DATA:  I have reviewed the data as listed  . CBC Latest Ref Rng & Units 06/22/2020 01/06/2020 06/14/2019  WBC 4.0 - 10.5 K/uL 14.5(H) 6.2 6.1  Hemoglobin 12.0 - 15.0 g/dL 14.5 13.8 13.6  Hematocrit 36 - 46 % 43.8 42.2 40.8  Platelets 150 - 400 K/uL 201 203 207  hgb 13.5  . CMP Latest Ref Rng & Units 06/22/2020 01/06/2020 12/06/2019  Glucose 70 - 99 mg/dL 118(H) 87 82  BUN 8 - 23 mg/dL $Remove'12 15 16  'kKfhUBB$ Creatinine 0.44 - 1.00 mg/dL  0.95 0.75 0.82  Sodium 135 - 145 mmol/L 139 142 143  Potassium 3.5 - 5.1 mmol/L 4.2 4.7 3.8  Chloride 98 - 111 mmol/L 102 104 102  CO2 22 - 32 mmol/L $RemoveB'29 31 30  'hQTCvXVH$ Calcium 8.9 - 10.3 mg/dL 10.1 9.5 9.5  Total Protein 6.5 - 8.1 g/dL 7.3 7.0 7.6  Total Bilirubin 0.3 - 1.2 mg/dL 1.6(H) 0.8 1.0  Alkaline Phos 38 - 126 U/L 90 79 76  AST 15 - 41 U/L $Remo'22 22 23    'GeWEZ$ ALT 0 - 44 U/L $Remo'17 20 22   'aRQJy$ 02/12/18 Calcitonin:     PATHOLOGY  Lung Biopsy at [facility] 09/23/14 DIAGNOSIS A. Pleural nodule, biopsy:  Calcified and hyalinized remote fat necrosis. No evidence of malignancy.   B. Level 9 lymph node, biopsy:  Metastatic medullary thyroid carcinoma. See comment.  Comment:: A note is made of the patient's history of medullary thyroid carcinoma with documented prior metastatic disease to cervical lymph nodes ([id number]; 04/05/2010).  The prior metastases to cervical lymph nodes are reviewed in conjunction to the current case and the tumors are morphologically similar.   Confirmatory immunohistochemical stains are performed. Tumor cells stain positively for calcitonin and for an anticytokeratin cocktail. The morphologic and immunophenotypic findings support metastatic medullary thyroid carcinoma.   C. Level 8 lymph node, biopsy:  Profiles of lymph nodes, negative for malignancy.   D. Pleural nodule #2, biopsy:  Fibrovascular tissue with extensive cauterization. No evidence of malignancy.        Surgical Pathology at [facility] 11/08/12 Immunohistochemical Findings An immunohistochemical stain for calcitonin obtained on paraffin block A1 is positive.   Diagnosis A. "RIGHT LEVEL 6 LYMPH NODES" (BIOPSY):  METASTATIC MEDULLARY THYROID CARCINOMA IN TWO LYMPH NODES (2/2). SIZE OF LARGEST METASTASIS:2 CM. EXTRANODAL INVASION:PRESENT. Comment:  I certify that I personally conducted the diagnostic evaluation of the above  specimen(s) and have rendered the above diagnosis(es). Rex C. Telford Nab, M.D. [phone] Electronically signed: 11/16/12   A. "Right level 6 lymph nodes", received fresh and placed in formalin on 1/9/ two fragments of tan-red soft tissue.Fragment 1 is 1.3 x 1 x 0.6 cm and is bisected and submitted in block A1.Fragment 2 is 2 x 1 x 0.4 cm and  is submitted entirely in block A2.    RADIOGRAPHIC STUDIES: I have personally reviewed the radiological images as listed and agreed with the findings in the report. CT Chest W Contrast  Result Date: 06/22/2020 CLINICAL DATA:  Thyroid cancer. EXAM: CT CHEST, ABDOMEN, AND PELVIS WITH CONTRAST TECHNIQUE: Multidetector CT imaging of the chest, abdomen and pelvis was performed following the standard protocol during bolus administration of intravenous contrast. CONTRAST:  43mL OMNIPAQUE IOHEXOL 300 MG/ML  SOLN COMPARISON:  12/06/2019. FINDINGS: CT CHEST FINDINGS Cardiovascular: Atherosclerotic calcification of the aorta and coronary arteries. Heart size normal. No pericardial effusion. Mediastinum/Nodes: Thyroidectomy. Right supraclavicular lymph nodes measure up to 11 mm (2/7), unchanged from 12/06/2019. Mediastinal lymph nodes measure up to 12 mm in the low right paratracheal station (2/25), also stable. No hilar or axillary lymph nodes. Periesophageal lymph nodes measure up to 8 mm, unchanged. Esophagus is unremarkable. Lungs/Pleura: Extensive biapical pleuroparenchymal scarring with associated architectural distortion, right greater than left. Cylindrical bronchiectasis bilaterally. Subpleural radiation scarring in the left upper lobe. Slight consolidation, high attenuation debris and subpleural ground-glass in the left lower lobe are new from 12/06/2019. Trace left pleural fluid. Airway is unremarkable. Musculoskeletal: There are sclerotic lesions seen throughout the visualized osseous structures, similar. CT ABDOMEN PELVIS FINDINGS Hepatobiliary:  Liver is enlarged measuring approximately 20.3 cm. Low-attenuation lesions in the liver measure up to 10 mm in the left hepatic lobe and are indicative of cysts. Biliary ductal dilatation is unchanged. Pancreas: Negative. Spleen: Low-attenuation lesions in the spleen measure up to 10 mm, as before but too small to characterize. Adrenals/Urinary Tract: Adrenal  glands and right kidney are unremarkable. Subcentimeter low-attenuation lesions in the left kidney are too small to characterize but statistically, cysts are likely. Bladder is grossly unremarkable. Stomach/Bowel: Stomach, small bowel and colon are unremarkable. Appendix is not readily visualized. Vascular/Lymphatic: Atherosclerotic calcification of the aorta without aneurysm. No pathologically enlarged lymph nodes. Reproductive: Hysterectomy. Other: There may be a small amount of pelvic free fluid. Mesenteries and peritoneum are otherwise unremarkable. Musculoskeletal: Postoperative changes in the proximal left femur. Sclerotic lesions are seen throughout the visualized osseous structures, as before. IMPRESSION: 1. Borderline enlarged supraclavicular and mediastinal lymph nodes, stable. 2. Osseous metastatic disease, stable. 3. Left lower lobe aspiration and trace left pleural fluid. 4. Hepatomegaly. 5. Aortic atherosclerosis (ICD10-I70.0). Coronary artery calcification. Electronically Signed   By: Lorin Picket M.D.   On: 06/22/2020 13:25   CT ABDOMEN PELVIS W CONTRAST  Result Date: 06/22/2020 CLINICAL DATA:  Thyroid cancer. EXAM: CT CHEST, ABDOMEN, AND PELVIS WITH CONTRAST TECHNIQUE: Multidetector CT imaging of the chest, abdomen and pelvis was performed following the standard protocol during bolus administration of intravenous contrast. CONTRAST:  38mL OMNIPAQUE IOHEXOL 300 MG/ML  SOLN COMPARISON:  12/06/2019. FINDINGS: CT CHEST FINDINGS Cardiovascular: Atherosclerotic calcification of the aorta and coronary arteries. Heart size normal. No pericardial effusion. Mediastinum/Nodes: Thyroidectomy. Right supraclavicular lymph nodes measure up to 11 mm (2/7), unchanged from 12/06/2019. Mediastinal lymph nodes measure up to 12 mm in the low right paratracheal station (2/25), also stable. No hilar or axillary lymph nodes. Periesophageal lymph nodes measure up to 8 mm, unchanged. Esophagus is unremarkable.  Lungs/Pleura: Extensive biapical pleuroparenchymal scarring with associated architectural distortion, right greater than left. Cylindrical bronchiectasis bilaterally. Subpleural radiation scarring in the left upper lobe. Slight consolidation, high attenuation debris and subpleural ground-glass in the left lower lobe are new from 12/06/2019. Trace left pleural fluid. Airway is unremarkable. Musculoskeletal: There are sclerotic lesions seen throughout the visualized osseous structures, similar. CT ABDOMEN PELVIS FINDINGS Hepatobiliary: Liver is enlarged measuring approximately 20.3 cm. Low-attenuation lesions in the liver measure up to 10 mm in the left hepatic lobe and are indicative of cysts. Biliary ductal dilatation is unchanged. Pancreas: Negative. Spleen: Low-attenuation lesions in the spleen measure up to 10 mm, as before but too small to characterize. Adrenals/Urinary Tract: Adrenal glands and right kidney are unremarkable. Subcentimeter low-attenuation lesions in the left kidney are too small to characterize but statistically, cysts are likely. Bladder is grossly unremarkable. Stomach/Bowel: Stomach, small bowel and colon are unremarkable. Appendix is not readily visualized. Vascular/Lymphatic: Atherosclerotic calcification of the aorta without aneurysm. No pathologically enlarged lymph nodes. Reproductive: Hysterectomy. Other: There may be a small amount of pelvic free fluid. Mesenteries and peritoneum are otherwise unremarkable. Musculoskeletal: Postoperative changes in the proximal left femur. Sclerotic lesions are seen throughout the visualized osseous structures, as before. IMPRESSION: 1. Borderline enlarged supraclavicular and mediastinal lymph nodes, stable. 2. Osseous metastatic disease, stable. 3. Left lower lobe aspiration and trace left pleural fluid. 4. Hepatomegaly. 5. Aortic atherosclerosis (ICD10-I70.0). Coronary artery calcification. Electronically Signed   By: Lorin Picket M.D.   On:  06/22/2020 13:25     CT Chest w Contrast w 3D MIPS Protocol at [facility] 09/29/17 Impression: 1.Mildly  enlarged mediastinal lymph nodes are stable when compared to most recent CT. Although, several nodes are minimally increased from 03/08/2016.  2.Refer to separately dictated abdominal CT report for findings below the diaphragm.  Bone Scan Whole Body at [facility] 09/27/17 Impression: 1. Stable foci of increased radiotracer activity in the right frontal calvarium, indeterminate. 2. No new scintigraphic evidence of osseous metastasis.  US Lymph Node Neck Mapping at [facility] 08/29/17 Impression: 1.  Newly measured right level 5 lymph node (lymph node #5) with abnormal rounded morphology, no echogenic hilum, and microcalcifications. Although biopsy of this nodule may be technically difficult due to its location deep to the carotid vasculature, ultrasound-guided biopsy is recommended (if possible).  2.  Newly measured right level 2A lymph node without suspicious features.  3.  Additional right neck nodes are unchanged.   Bone Density Scan 02/27/17 @ [facility]  Lowest Site Measured: Total Left hip with T-score of -3.1 and BMD of 0.568   CT CAP W Contrast W MIPS at [facility] 02/13/17 Impression: 1. Please see CT neck completed same day for findings in the neck. 2. Stable nonenlarged mediastinal lymph nodes and soft tissue nodule in the right thyroidectomy bed. 3. Unchanged irregular opacities in the lung apices bilaterally, which may represent treated metastases or scarring. No new metastatic disease in the chest, abdomen, or pelvis.  CT Nect Soft Tissue W Contrast at [facility] 02/13/17 IMPRESSION: Numerous soft tissue enhancing lesions in the superior mediastinum. While these are stable when compared to most recent CT, several of these lesions demonstrate minimal growth from the more remote exam on 09/07/2015. No new enhancing lesions are present.  ASSESSMENT & PLAN:   Shannon [name] is a 79 y.o.  caucasian female with   1. 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  She had a level 6 LN dissection on 11/08/12 with 2/2 LN positive, largest 2cm for MTC.  09/05/14 CAP CT with development of new calcified nodules in the left lower lobe 09/23/14 Lung biopsy showed Level 9 lymph node, positive for malignancy and metastatic cancer which was resected. Treatment for symptomatic disease with vandetanib and cabozantibnib were previously discussed with her 14 Oncologist but the patient had decided to hold off on treatment due to concerns for toxicities and lack of overt cancer related progressive symptoms. Latest scans from 08/2017 are stable, no disease found below chest or in the bones. Her calcitonin levels have been increasing since 2015. Overall pt currently asymptomatic.   CT chest on 10/17/2018 which revealed "1. Unchanged mediastinal and right supraclavicular lymphadenopathy. Findings are nonspecific, however metastatic disease is not excluded. 2. Stable biapical scarring and right apical consolidative focus with associated calcification, likely postinfectious in nature."  10/11/2018 CT angiography chest w contrast revealed "1. No demonstrable pulmonary embolus. No thoracic aortic aneurysm or dissection. There are foci of aortic atherosclerosis as well as great vessel and coronary artery calcifications. 2. Fibrosis and cicatrization in the apices with apical pleural calcification, likely due to scarring. There is focal airspace opacity which is asymmetric in the apical segment of the right upper lobe. This finding may well be due to chronic scarring. A potential focus of pneumonia or possible neoplastic involvement in this area must be of concern. This finding may well warrant correlation with nuclear medicine PET study to assess  for abnormal metabolic activity. At a minimum, a follow-up chest CT in  3 months to assess for stability of this area would be advised. No other airspace consolidation noted. Areas of patchy atelectasis noted. No well-defined nodular type lesions noted in the lung parenchyma. 3. Probable sclerotic bony metastases at C3 and L1. Sclerotic focus at L1 is incompletely visualized. Medullary thyroid carcinoma as well as breast carcinoma potentially may present with sclerotic as opposed to lytic metastases. 4.  No demonstrable thoracic adenopathy. 5. Extensive postoperative change in the thyroid region with evidence of total thyroidectomy. 6. Mild reflux into the inferior vena cava and hepatic veins suggests increase in right heart pressure. Aortic Atherosclerosis (ICD10-I70.0)."  12/06/2019 CT C/A/P ([id number]) ([id number]) revealed "1. Stable exam. No new or progressive findings to suggest progressive metastatic disease. 2. Scattered sclerotic bone lesions, similar to prior. 3. Stable mild intrahepatic and extrahepatic biliary duct dilatation. 4. Trace free fluid in the cul-de-sac."  12/06/2019 CT Soft Tissue Neck ([id number]) revealed "1. Thyroidectomy with nodal metastases at the thoracic inlet/anterior compartment and right supraclavicular fossa. There are no prior neck CT comparison is available for review. 2. Spinal osseous metastatic disease without visible extraosseous tumor extension."  2. 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 -Secondary to hypercalcitoninemia  -Currently on imodium daily, cholestyramine twice daily  -was previously on monthly Sandostatin injections in 10/20/17- but patient decided to hold citing no real benefit from adding this -Her last colonoscopy was in 2010  PLAN:  -Discussed pt labwork, 06/22/20;  all values are WNL except for WBC at 14.5K, Neutro Abs at 13.0K, Lymphs Abs at 0.6K, Glucose at 118, Total Bilirubin at 1.6, GFR Est Non Af Am at  57. -Discussed 06/22/2020 CEA at 62.11, Calcitonin at 39215.0 - both continuing to increase slowly -Discussed 02/17/2020 XR Esophagus ([id number]) which revealed "1. Patent esophagus without evidence for stricture or mass. 2. Aspiration. Consider further evaluation with speech pathology oropharyngeal motility study." -Discussed 06/22/2020 CT C/A/P ([id number]) ([id number]) which revealed "1. Borderline enlarged supraclavicular and mediastinal lymph nodes, stable. 2. Osseous metastatic disease, stable. 3. Left lower lobe aspiration and trace left pleural fluid. 4. Hepatomegaly. 5. Aortic atherosclerosis (ICD10-I70.0). Coronary artery calcification." -Some evidence of Metastatic Medullary Thyroid Cancer progression at this time. Would not consider this symptomatic progression.  -Pt's rapid weight loss and ongoing aspiration is the primary concern at this time. Need to address dysphagia and nutrition.  -Advised pt that her swallowing needs to be addressed by ENT and Speech Pathology so that she can maintain nutrition orally.  -Will order antibiotics to rx any possible  aspiration pneumonia. -Recommend pt f/u with Gastroenterologist for reflux management. -Recommend pt f/u with ENT, Jolene Provost PA-C -Will refer pt to Speech Pathologist  -Will get CT Neck in 1 week  -Phone visit in 3 weeks  -Will see back in 4 months with labs   3. Osteoporosis - Last DEXA 02/27/17 with Lowest Site Measured: Total Left hip with T-score of -3.1 and BMD of 0.568 Closed left hip fracture from fall -s/p surgery on 10/23/17 with rod placement - healing well.  -She takes Reclast injections for her osteoporosis. Managed by her PCP   4. Chronic oropharyngeal Dysphagia ? Related to previous neck surgery. Dry mouth etc. No acute changes  Plan -ENT referral for further evaluation of this since patient notes it is bothersome -discussed input regarding mx of dry mouth.  FOLLOW UP: -CT neck in 1 week -Phone visit with  Dr Irene Limbo in 3 weeks -RTC with Dr Irene Limbo with labs in 4 months  The total time spent in the appt was 30 minutes and more than 50% was on counseling and direct patient cares.  All of the patient's questions were answered with apparent satisfaction. The patient knows to call the clinic with any problems, questions or concerns.   Sullivan Lone MD [place] AAHIVMS [facility] Hematology/Oncology Physician [facility]  (Office):       [phone] (Work cell):  [phone] (Fax):           [phone]  07/09/2020 12:12 PM  I, Yevette Edwards, am acting as a scribe for Dr. Sullivan Lone.   .I have reviewed the above documentation for accuracy and completeness, and I agree with the above. Brunetta Genera MD

## 2020-07-09 ENCOUNTER — Inpatient Hospital Stay: Payer: Medicare Other | Attending: Hematology | Admitting: Hematology

## 2020-07-09 ENCOUNTER — Telehealth: Payer: Self-pay | Admitting: Hematology

## 2020-07-09 ENCOUNTER — Other Ambulatory Visit: Payer: Self-pay

## 2020-07-09 VITALS — BP 127/64 | HR 101 | Temp 97.5°F | Resp 18 | Ht 63.0 in | Wt 85.2 lb

## 2020-07-09 DIAGNOSIS — R197 Diarrhea, unspecified: Secondary | ICD-10-CM | POA: Diagnosis not present

## 2020-07-09 DIAGNOSIS — J690 Pneumonitis due to inhalation of food and vomit: Secondary | ICD-10-CM

## 2020-07-09 DIAGNOSIS — E070 Hypersecretion of calcitonin: Secondary | ICD-10-CM | POA: Diagnosis not present

## 2020-07-09 DIAGNOSIS — C73 Malignant neoplasm of thyroid gland: Secondary | ICD-10-CM

## 2020-07-09 DIAGNOSIS — R1310 Dysphagia, unspecified: Secondary | ICD-10-CM | POA: Diagnosis not present

## 2020-07-09 DIAGNOSIS — Z85850 Personal history of malignant neoplasm of thyroid: Secondary | ICD-10-CM | POA: Insufficient documentation

## 2020-07-09 MED ORDER — AMOXICILLIN-POT CLAVULANATE 875-125 MG PO TABS: 1.0000 | ORAL_TABLET | Freq: Two times a day (BID) | ORAL | 0 refills | Status: DC

## 2020-07-09 NOTE — Telephone Encounter (Signed)
Scheduled per 09/09 los, patient has been called and voicemail was left.

## 2020-07-13 ENCOUNTER — Other Ambulatory Visit: Payer: Self-pay

## 2020-07-13 ENCOUNTER — Other Ambulatory Visit (INDEPENDENT_AMBULATORY_CARE_PROVIDER_SITE_OTHER): Payer: Medicare Other

## 2020-07-13 ENCOUNTER — Encounter: Payer: Self-pay | Admitting: Internal Medicine

## 2020-07-13 ENCOUNTER — Ambulatory Visit: Payer: Medicare Other | Admitting: Internal Medicine

## 2020-07-13 VITALS — BP 120/70 | HR 98 | Ht 63.0 in | Wt 85.8 lb

## 2020-07-13 DIAGNOSIS — C801 Malignant (primary) neoplasm, unspecified: Secondary | ICD-10-CM

## 2020-07-13 DIAGNOSIS — E3122 Multiple endocrine neoplasia [MEN] type IIA: Secondary | ICD-10-CM

## 2020-07-13 DIAGNOSIS — E890 Postprocedural hypothyroidism: Secondary | ICD-10-CM | POA: Diagnosis not present

## 2020-07-13 DIAGNOSIS — R4781 Slurred speech: Secondary | ICD-10-CM | POA: Diagnosis not present

## 2020-07-13 DIAGNOSIS — K529 Noninfective gastroenteritis and colitis, unspecified: Secondary | ICD-10-CM

## 2020-07-13 LAB — TSH: TSH: 6.25 u[IU]/mL — ABNORMAL HIGH (ref 0.35–4.50)

## 2020-07-13 NOTE — Progress Notes (Signed)
Name: Shannon [name]  MRN/ DOB: [id number], September 28, 1941    Age/ Sex: 79 y.o., female     PCP: Aletha Halim., PA-C   Reason for Endocrinology Evaluation: MEN2A/Medullary Thyroid Cancer     Initial Endocrinology Clinic Visit: 07/15/2019    PATIENT IDENTIFIER: Shannon [name] is a 79 y.o., female with a past medical history of MEN2, osteoporosis and post-surgical hypothyroidism. Shannon [name] with [facility] since 07/15/2019 for consultative assistance with management of her MEN2A.   HISTORICAL SUMMARY:  Shannon [name] with MEN2A in 2011. Shannon [name] is S/P thyroidectomy in 10/2009 secondary to medullary cancer.   S/P B/L neck dissection 03/2010 S/P redo dissection of the right level VI L.N in 10/2012, largest 2 cm due to mets In 2015  Had a stable right lateral compartment level IV and V L.N . The cystic right level IV L.N previously biopsied 12/2010 negative for mets.  S/P thoracoscopy (08/2014) level IX L.N with medullary mets.    Shannon [name] has had diarrhea in 2010, started on Imodium for years. Somatostatin was not found to be effective.   In 08/2018 was found to have an endometrial mass on MRI, further testing was benign.   Shannon [name] used to follow up at [facility], serial testing has come back negative for pheo or hypercalcemia.    Over the years her calcitonin and CEA levels have been gradually increasing.   Shannon [name] continues with chronic diarrhea, diarrhea has been there since 2010. Treatment was started some time in 2013. Shannon [name] has been on loperamide for years, Shannon [name] takes 2 tabs TID, which is above the max recommended dose.  Shannon [name] has 2-3 bowel movements a day, Loperamide is expensive due to quantity used , so we switched to Lomotil  Stool consistency has improved with consuming protein shakes.     Shannon [name] has been [name] with osteoporosis in 2016 and has been on Reclast since then.    SUBJECTIVE:    Today (07/13/2020):  Shannon [name] for f/u on medullary  carcinoma.    Weight stable Was recently evaluated by ENT for hoarseness, laryngoscope was unrevealing. But continues with voice hoariness and dysphagia to certain foods.    Shannon [name] noted a right localized neck swelling a few months ago, has been stable, denies pain.  Had barium swallow and per Shannon [name] was noted to have aspiration    Stools have been normal lately,cholestyramine and Imodium are helping     Shannon [name] is on calcium BID  Vitamin D 1000 iu daily   Has rare pains and aches   Has been stressed out   Denies local neck symptoms   HISTORY:  Past Medical History:  Past Medical History:  Diagnosis Date  . Anxiety   . Breast cancer ([place]) 1999   left lumpectomy/radiation/ductal carcinoma in situ  . Cancer ([place])   . Colon polyp   . Contact lens/glasses fitting    HAS LENS IMPLANTS  . Lung cancer ([place]) 2015   LYMPHNODE REMOVAL   . Medullary carcinoma of thyroid ([place]) 2011  . Osteoporosis    Past Surgical History:  Past Surgical History:  Procedure Laterality Date  . ABDOMINAL HYSTERECTOMY    . ABDOMINAL SURGERY  1989   RUPTURED APPENDIX  . BREAST SURGERY  1999   LEFT BREAST LUMPECTOMY. DUCTAL CARCINOMA IN SITU./ RADIATION  . CATARACT EXTRACTION W/ INTRAOCULAR LENS IMPLANT  2009   BOTH EYES  . FEMUR IM NAIL Left 10/23/2017   Procedure: INTRAMEDULLARY (IM) NAIL  FEMORAL;  Surgeon: Paralee Cancel, MD;  Location: [facility];  Service: Orthopedics;  Laterality: Left;  . FOOT SURGERY  1070   RIGHT FOOT - PINCHED NERVE  . LUNG SURGERY     CALCIFICATIONS, POSITIVE - CANCER LYMPHNODE   . MELANOMA EXCISION  2008   CHEST AREA  . NECK SURGERY  Nov 09 2011   LYMPHNODES REMOVED 2 POSITIVE   . RIGHT COLECTOMY  06/02/2009  . THYROIDECTOMY  11/06/2009  . TONSILLECTOMY AND ADENOIDECTOMY      Social History:  reports that Shannon [name] quit smoking about 59 years ago. Shannon [name] has never used smokeless tobacco. Shannon [name] reports that Shannon [name] does not drink alcohol and does not use drugs. Family History:  Family  History  Problem Relation Age of Onset  . Cancer Father        throat/ bone  . Breast cancer Sister   . Cancer Sister        MEDULAR CANCER  . Cancer Brother        prostate  . Cancer Other        MEDULAR CANCER  . Breast cancer Maternal Aunt   . Cancer Paternal Aunt        pancreatic   . Cancer Cousin        ON MOTHER'S SIDE- LIVER CANCER     HOME MEDICATIONS: Allergies as of 07/13/2020      Reactions   Codeine Other (See Comments)   hyper      Medication List       Accurate as of July 13, 2020 10:44 AM. If you have any questions, ask your nurse or doctor.        STOP taking these medications   amoxicillin-clavulanate 875-125 MG tablet Commonly known as: AUGMENTIN Stopped by: Dorita Sciara, MD     TAKE these medications   ALPRAZolam 1 MG tablet Commonly known as: XANAX Take 0.5 tablets (0.5 mg total) by mouth 2 (two) times daily.   CALCIUM 600 + MINERALS PO Take 1 tablet by mouth daily.   cetirizine 10 MG tablet Commonly known as: ZYRTEC Take 10 mg by mouth daily.   cholestyramine 4 g packet Commonly known as: QUESTRAN Take 1 packet (4 g total) by mouth 2 (two) times daily.   Cod Liver Oil 1000 MG Caps Take 1,000 mg by mouth daily.   Glucosamine-Chondroitin 250-200 MG Caps Take 1 tablet by mouth daily. Name of table is SCHIFF   levothyroxine 125 MCG tablet Commonly known as: SYNTHROID Take 1 tablet by mouth once daily   loperamide 2 MG tablet Commonly known as: IMODIUM A-D Take 2 mg by mouth 3 (three) times daily.   multivitamin tablet Take 1 tablet by mouth daily.   pantoprazole 40 MG tablet Commonly known as: PROTONIX Take 40 mg by mouth daily.   Reclast 5 MG/100ML Soln injection Generic drug: zoledronic acid Inject 5 mg into the vein once.   simethicone 80 MG chewable tablet Commonly known as: MYLICON Chew 80 mg by mouth 3 (three) times daily.   Vitamin D 1000 units capsule Take 1,000 Units by mouth daily.          OBJECTIVE:   PHYSICAL EXAM: VS: BP 120/70 (BP Location: Left Arm, Patient Position: Sitting, Cuff Size: Small)   Pulse 98   Ht 5' 3"  (1.6 m)   Wt 85 lb 12.8 oz (38.9 kg)   SpO2 97%   BMI 15.20 kg/m    EXAM: General: Shannon [name] appears well and is  in NAD  Neck: General: Supple without adenopathy. Thyroid: Thyroid surgically removed, no adenopathy noted. Shannon [name] has a superficial rubbery round lesion at the right supraclavicular area   Lungs: Clear with good BS bilat with no rales, rhonchi, or wheezes  Heart: Auscultation: RRR.  Abdomen: Normoactive bowel sounds, soft, nontender, without masses or organomegaly palpable  Extremities:  BL LE: No pretibial edema normal ROM and strength.  Mental Status: Judgment, insight: Intact Orientation: Oriented to time, place, and person Mood and affect: No depression, anxiety, or agitation     DATA REVIEWED: Results for VANISSA, STRENGTH (MRN [id number]) as of 07/14/2020 14:17  Ref. Range 06/22/2020 10:42  Sodium Latest Ref Range: 135 - 145 mmol/L 139  Potassium Latest Ref Range: 3.5 - 5.1 mmol/L 4.2  Chloride Latest Ref Range: 98 - 111 mmol/L 102  CO2 Latest Ref Range: 22 - 32 mmol/L 29  Glucose Latest Ref Range: 70 - 99 mg/dL 118 (H)  BUN Latest Ref Range: 8 - 23 mg/dL 12  Creatinine Latest Ref Range: 0.44 - 1.00 mg/dL 0.95  Calcium Latest Ref Range: 8.9 - 10.3 mg/dL 10.1  Anion gap Latest Ref Range: 5 - 15  8  Alkaline Phosphatase Latest Ref Range: 38 - 126 U/L 90  Albumin Latest Ref Range: 3.5 - 5.0 g/dL 4.2  AST Latest Ref Range: 15 - 41 U/L 22  ALT Latest Ref Range: 0 - 44 U/L 17  Total Protein Latest Ref Range: 6.5 - 8.1 g/dL 7.3  Total Bilirubin Latest Ref Range: 0.3 - 1.2 mg/dL 1.6 (H)  GFR, Est Non African American Latest Ref Range: >60 mL/min 57 (L)  GFR, Est African American Latest Ref Range: >60 mL/min >60   Results for BRYNLEI, KLAUSNER (MRN [id number]) as of 07/14/2020 14:17  Ref. Range 07/13/2020 11:33  TSH Latest Ref Range: 0.35 -  4.50 uIU/mL 6.25 (H)   Results for RAIYAH, SPEAKMAN (MRN [id number]) as of 01/13/2020 10:30  Ref. Range 08/05/2019 17:13  Dopamine 24 Hr Urine Latest Ref Range: 52 - 480 mcg/24 h 163  Epinephrine, 24H, Ur Latest Units: mcg/24 h see note  Metanephrine, Ur Latest Ref Range: 224 - 832 mcg/24 h 554  Metanephrines, Ur Latest Ref Range: 90 - 315 mcg/24 h 205  Norepinephrine, 24H, Ur Latest Ref Range: 15 - 100 mcg/24 h 38  Total Volume Latest Units: mL 850  Creatinine, Urine mg/day-CATEUR Latest Ref Range: 0.50 - 2.15 g/24 h 0.51  Normetanephr.,U,24h Latest Ref Range: 122 - 676 mcg/24 h 349  Volume, Urine-VMAUR Latest Units: mL 850      CT 06/22/2020  FINDINGS: CT CHEST FINDINGS  Cardiovascular: Atherosclerotic calcification of the aorta and coronary arteries. Heart size normal. No pericardial effusion.  Mediastinum/Nodes: Thyroidectomy. Right supraclavicular lymph nodes measure up to 11 mm (2/7), unchanged from 12/06/2019. Mediastinal lymph nodes measure up to 12 mm in the low right paratracheal station (2/25), also stable. No hilar or axillary lymph nodes. Periesophageal lymph nodes measure up to 8 mm, unchanged. Esophagus is unremarkable.  Lungs/Pleura: Extensive biapical pleuroparenchymal scarring with associated architectural distortion, right greater than left. Cylindrical bronchiectasis bilaterally. Subpleural radiation scarring in the left upper lobe. Slight consolidation, high attenuation debris and subpleural ground-glass in the left lower lobe are new from 12/06/2019. Trace left pleural fluid. Airway is unremarkable.  Musculoskeletal: There are sclerotic lesions seen throughout the visualized osseous structures, similar.  CT ABDOMEN PELVIS FINDINGS  Hepatobiliary: Liver is enlarged measuring approximately 20.3 cm. Low-attenuation lesions in the liver measure  up to 10 mm in the left hepatic lobe and are indicative of cysts. Biliary ductal dilatation is  unchanged.  Pancreas: Negative.  Spleen: Low-attenuation lesions in the spleen measure up to 10 mm, as before but too small to characterize.  Adrenals/Urinary Tract: Adrenal glands and right kidney are unremarkable. Subcentimeter low-attenuation lesions in the left kidney are too small to characterize but statistically, cysts are likely. Bladder is grossly unremarkable.  Stomach/Bowel: Stomach, small bowel and colon are unremarkable. Appendix is not readily visualized.  Vascular/Lymphatic: Atherosclerotic calcification of the aorta without aneurysm. No pathologically enlarged lymph nodes.  Reproductive: Hysterectomy.  Other: There may be a small amount of pelvic free fluid. Mesenteries and peritoneum are otherwise unremarkable.  Musculoskeletal: Postoperative changes in the proximal left femur. Sclerotic lesions are seen throughout the visualized osseous structures, as before.  IMPRESSION: 1. Borderline enlarged supraclavicular and mediastinal lymph nodes, stable. 2. Osseous metastatic disease, stable. 3. Left lower lobe aspiration and trace left pleural fluid. 4. Hepatomegaly. 5. Aortic atherosclerosis (ICD10-I70.0). Coronary artery calcification.    ASSESSMENT / PLAN / RECOMMENDATIONS:   1. MEN2A:  - Shannon [name] with medullary carcinoma ,  no clinical or biochemical evidence of pheochromocytoma or hypercalcemia - Due for repeat 24-hr urine catecholamines    2. Medullary Carcinoma:   - S/P thyroidectomy in 2011 [name] by dissection and redo of the right level VI L.N in 10/2012, Shannon [name] is also S/P thoracoscopy (08/2014) level IX L.N with medullary mets.  - Stable chest/abdomen  imaging 05/2020 - Has a pending neck CT scan  - Shannon [name] has not been found to be a candidate for TKI 's , despite their role in improving progression-free survival compared to placebo but has not shown to improve overall survival.     3. Postoperative hypothyroidism :   - Shannon [name] is clinically  euthyroid  - TSH elevated again, I suspect this is due to variable absorption given cholestyramine intake and calcium intake  - Will increase dose as below   Medication:  Levothyroxine 125 mcg, two tabs on Sunday and 1 tab daily rest of the week    4. 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   Medications Cholestyramine 4 grams BID Loperamide 2 grams TID    5. Slurred speech :  - Shannon [name] has been noted with worsening slurred speech. Shannon [name] has been evaluated by ENT per Shannon [name] and work up has been non-revealing. Except for GERD. Conventional GERD medications worsened her diarrhea and current medication has not been controlling symptoms.  - Shannon [name] is concerned about a stroke. Personally I have a difficult time understanding her speech today  - I am going to proceed with CT scan of the brain       F/U in 6 months  Labs in 8 weeks    Signed electronically by: Mack Guise, MD  [facility]  [facility] [address] [place], [place] [address] Phone: [phone] FAX: [phone]      CC: Amie Critchley [address] [place] [address] Phone: [phone]  Fax: [phone]   Return to Endocrinology clinic as below: Future Appointments  Date Time Provider [place]  07/17/2020  3:00 PM [facility]-CT 2 [facility]-CT [place]  07/30/2020 10:00 AM Brunetta Genera, MD [facility] None  11/12/2020 10:00 AM [facility] None  11/12/2020 10:40 AM Brunetta Genera, MD [facility] None

## 2020-07-13 NOTE — Patient Instructions (Signed)

## 2020-07-14 ENCOUNTER — Telehealth: Payer: Self-pay | Admitting: Internal Medicine

## 2020-07-14 MED ORDER — LEVOTHYROXINE SODIUM 125 MCG PO TABS: 125.0000 ug | ORAL_TABLET | ORAL | 3 refills | Status: DC

## 2020-07-14 NOTE — Telephone Encounter (Signed)
Patient called, patient is requesting a call back

## 2020-07-14 NOTE — Telephone Encounter (Signed)
Called pt and gave her MD message. PT verbalized understanding.

## 2020-07-14 NOTE — Telephone Encounter (Signed)
Please let her know her thyroid is off , I would suggest increasing Levothyroxine to TWO tablets on Sundays from now on but continue 1 tablet ( Monday through Saturday)    Will need repeat labs in 8 weeks ( orders in ) Thanks    [place], MD  [facility] Endocrin[facility]  [facility] [address] [place], [place] [address] Phone: [phone] FAX: [phone]

## 2020-07-14 NOTE — Telephone Encounter (Signed)
Attempted to call pt. She did not answer, and voicemail was left requesting a call back.

## 2020-07-15 ENCOUNTER — Ambulatory Visit: Payer: Medicare Other | Admitting: Internal Medicine

## 2020-07-17 ENCOUNTER — Other Ambulatory Visit: Payer: Self-pay

## 2020-07-17 ENCOUNTER — Encounter (HOSPITAL_COMMUNITY): Payer: Self-pay

## 2020-07-17 ENCOUNTER — Ambulatory Visit (HOSPITAL_COMMUNITY)
Admission: RE | Admit: 2020-07-17 | Discharge: 2020-07-17 | Disposition: A | Discharge status: Home / Self Care | Payer: Medicare Other | Source: Ambulatory Visit | Attending: Hematology | Admitting: Hematology

## 2020-07-17 DIAGNOSIS — C73 Malignant neoplasm of thyroid gland: Secondary | ICD-10-CM | POA: Insufficient documentation

## 2020-07-17 DIAGNOSIS — R1310 Dysphagia, unspecified: Secondary | ICD-10-CM | POA: Insufficient documentation

## 2020-07-17 MED ORDER — IOHEXOL 300 MG/ML  SOLN
75.0000 mL | Freq: Once | INTRAMUSCULAR | Status: AC | PRN
  Administered 2020-07-17: 75 mL via INTRAVENOUS

## 2020-07-30 ENCOUNTER — Ambulatory Visit: Payer: Medicare Other | Admitting: Hematology

## 2020-08-03 ENCOUNTER — Other Ambulatory Visit: Payer: Self-pay

## 2020-08-03 ENCOUNTER — Inpatient Hospital Stay: Payer: Medicare Other | Attending: Hematology | Admitting: Hematology

## 2020-08-03 VITALS — BP 124/64 | HR 90 | Temp 98.1°F | Resp 18 | Ht 63.0 in | Wt 86.0 lb

## 2020-08-03 DIAGNOSIS — C73 Malignant neoplasm of thyroid gland: Secondary | ICD-10-CM | POA: Diagnosis not present

## 2020-08-03 DIAGNOSIS — R1310 Dysphagia, unspecified: Secondary | ICD-10-CM | POA: Diagnosis not present

## 2020-08-03 NOTE — Progress Notes (Signed)
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 total thyroidectomy in January of 2011 and bilateral neck dissection in June of 2011.  B. Persistent hypercalcitoninemia postoperatively. Calcitonin 499 in 07/2011  C. December 2012: CT of neck/chest was negative for metastases D. She had a normal neck ultrasound in 01/2012 showing her cystic LN was smaller (the one biopsied and negative).  E. FDG PET 08/2012 due to her last calcitonin raising substantially to 739 from the 500s: concerning right cervical lymph node F. 11/08/2012: Level 6 LN dissection. She had 2/2 LN positive, largest 2cm for MTC. Calcitonin one week later from her surgery was down to 515.  G. Calcitonin 592 on 05/24/2013. Repeat ultrasound on that same day which showed 2 at least partially cystic lymph nodes at level 4 which had been previously biopsied and negative; decreased in size since 2012. It also showed a subcentimeter right level VB LN of low suspicion. H. Her metanephrines (plasma) and PTH were normal in 12/2013 Urine studies for pheo were negative in 12/2013. I. 08/04/2014: CEA 9.0; calcitonin 1019 J. 09/05/2014:  - C/A/P CT scan: Interval development of a new calcified nodule in the left lower lobe. - Bone scan: Slight asymmetric increased radiotracer uptake in the right lateral calvarium on the frontal radiograph - Neck U/S: Stable right lateral compartment level 4 and 5B lymph nodes. The cystic right level 4 lymph node, previously biopsied on 01/14/2011 with negative pathology results.  K. 09/23/2014: Thoracoscopy: level 9 LN: Metastatic medullary thyroid carcinoma.  L. 03/11/2015:  CEA 15.9; calcitonin 1149 M 04-13-2015:  - C/A/P CT scan: Previously reported left lower lobe pulmonary nodule is no longer identified. No new or enlarging pulmonary nodules. No evidence of metastatic disease in the abdomen or pelvis. - CT brain:No acute intracranial abnormalities.  New small CSF density subdural fluid collection overlying the right cerebral hemisphere, likely a small hygroma of uncertain etiology. There is no associated bony or parenchymal lesion. 04-13-2015: Bone scan: A non-specific, very small focus of increased tracer activity in the right frontal calvarium is not significantly changed from the prior study. - Neck U/S: There is hypoechoic lesion with questionable microcalcification located in right level IV, it is 0.4 x 0.6 x 0.9 cm. No other pathologic cervical lymphadenopathy seen.  N. 04/13/2015: CEA 14.6; calcitonin 1324 O. 07/16/2015 ([facility]): CEA 19.4 (outsode of [facility]); CAlcitonin 1860. P. 09-07-2015: Neck, Chest , Abd, [place] CT: Stable post-thyroidectomy changes without evidence of recurrent or metastatic disease. Stable pulmonary nodules. Hepatic cysts. No bone mets. CEA: 16; Calcitonin 1533 Q. 03-08-2016: C/A/P CT scan: No definite evidence of metastatic disease in the chest, abdomen or pelvis. Compared to 09/07/2015, there is a new linear 3 mm right lower lobe pulmonary nodule which is indeterminate and may represent focal atelectasis CEA 16.5; CAlcitonin 1839  R. 08-22-2016: Calcitonin: 4596; CEA 27.1  S. 09-06-2016:  - Bone scan: Redemonstrated likely two foci of increased radiotracer uptake within the right calvarium, similar to prior study. - Neck CT: No evidence of recurrent or metastatic disease in the neck. - C/A/P CT: No definite evidence of metastatic disease in the chest, abdomen, or pelvis. - Neck CT: Status post thyroidectomy  and lymph node dissection without evidence of recurrent or metastatic disease.  T. Bone scan: A focus of increased tracer activity  projecting over the posterior right approximate 8th rib is non-specific; this finding may relate to summation of tracer activity in the rib with the inferior tip of the overlying right scapula, post-traumatic change or skeletal metastasis; recommend attention on follow-up. Re-demonstration of subtly increased tracer activity in the right frontal calvarium, stable since 09/05/2014. U. 02-13-2017:  - C/A/P CT scan: nonenlarged mediastinal lymph nodes and soft tissue nodule in the right thyroidectomy bed. Unchanged irregular opacities in the lung apices bilaterally, which may represent treated metastases or scarring.  - Neck CT: Numerous soft tissue enhancing lesions in the superior mediastinum. While these are stable when compared to most recent CT, several of these lesions demonstrate minimal growth from the more remote exam on 09/07/2015. No new enhancing lesions are present. - CEA 25.1, Calcitonin: 5498 V. 08-28-2017: fractionate plasma free metanephrines: Normetanephrine 178 (24hr urine pending as this was thought to be non-specific), Metanephrine: 44 (normal). CEA 46; Calcitonin 9763 W. 09-27-2017: Bone scan: Stable increased radiotracer activity in the right frontal calvarium, indeterminate. No other evidence of osseous metastasis Chest CT/Abd: Mildly enlarged mediastinal lymph nodes are stable. No evidence of metastatic disease in the abdomen   HISTORY OF PRESENTING ILLNESS:   Shannon [name] is a wonderful 79 y.o. female who has been referred to [place] by [place] oncologist Dr. Leslie Andrea for management of Metastatic Medullary Thyroid Cancer. She is looking for a Financial controller in [place].  Today she is accompanied by her sister. Patient notes this started with a nodule in her throat and her endocrinologist started further work up. She has been seeing Med onc at [facility] since her first surgery every 3-6 months.   She has had thyroid surgery and Ldissection but no systemic therapy as  detailed above in the oncologic history.  She notes her family had testing for MEN2A mutation. Her sister, her nephew carry the gene. She notes her maternal aunt had goiters but not sure of the cause.   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 She has had diarrhea since 2010. Her last colonoscopy was in 2010. She has tried multiple treatment options but this continues to worsen over time. She currently takes imodium 4-6 X daily, cholestyramine twice daily. She started monthly Sandostatin on 10/20/17. She fell and broke her hip on 10/23/17 and had surgery same day. She was given hydrocodone which helped her diarrhea and is not sure if the Sandostatin is working for her. She has been on oral iron and she is on aspirin 37m since her hip surgery. She does PT exercises and has been healing well.    She recently stopped caffeine tablets that she was taking for energy. She stopped 3 months ago. She is on Reclast for her osteoporosis, which is managed by her PCP, Dr. KDeatra Ina Pt notes she stopped smoking about 40 years ago and she rarely drinks alcohol.   On review of symptoms, pt notes continued diarrhea, stable energy, weight loss of 6-8 pounds in the last 6 months. Her appetite is adequate. Her diarrhea occurs currently twice a day with "pudding" form to liquid form stool. She can go up to 5-7 times a day at times. She denies cramping with diarrhea. She denies blood, and mucus in stool. Current use of oral iron causes some black stool, which was not present before use.   INTERVAL HISTORY:  I connected with  Berkley Harvey on 08/03/20 by telephone and verified that I am speaking with the correct person using two identifiers.   I discussed the limitations of evaluation and management by telemedicine. The patient expressed understanding and agreed to proceed.  Other persons participating in the visit and their role in the encounter: none  Patient's location:  Home Provider's location: [place] at [place] returns today regarding her Metastatic Medullary Thyroid Cancer. The patient's last visit with [place] was on 07/09/2020. The pt reports that she is doing well overall.  The pt reports no acute new symptoms.  Of note since the patient's last visit, pt has had CT Soft Tissue Neck ([id number]) completed on 07/17/2020 with results revealing "Postop thyroidectomy for medullary cancer of the thyroid. Metastatic lymph nodes in the right lower neck extending into the superior mediastinum appear stable from the prior CT. No new or progressive adenopathy. Sclerotic metastatic disease in the cervicothoracic spine stable from the prior study."   MEDICAL HISTORY:  Past Medical History:  Diagnosis Date  . Anxiety   . Breast cancer ([place]) 1999   left lumpectomy/radiation/ductal carcinoma in situ  . Cancer ([place])   . Colon polyp   . Contact lens/glasses fitting    HAS LENS IMPLANTS  . Lung cancer ([place]) 2015   LYMPHNODE REMOVAL   . Medullary carcinoma of thyroid ([place]) 2011  . Osteoporosis     SURGICAL HISTORY: Past Surgical History:  Procedure Laterality Date  . ABDOMINAL HYSTERECTOMY    . ABDOMINAL SURGERY  1989   RUPTURED APPENDIX  . BREAST SURGERY  1999   LEFT BREAST LUMPECTOMY. DUCTAL CARCINOMA IN SITU./ RADIATION  . CATARACT EXTRACTION W/ INTRAOCULAR LENS IMPLANT  2009   BOTH EYES  . FEMUR IM NAIL Left 10/23/2017   Procedure: INTRAMEDULLARY (IM) NAIL FEMORAL;  Surgeon: Paralee Cancel, MD;  Location: [facility];  Service: Orthopedics;  Laterality: Left;  . FOOT SURGERY  1070   RIGHT FOOT - PINCHED NERVE  . LUNG SURGERY     CALCIFICATIONS, POSITIVE - CANCER LYMPHNODE   . MELANOMA EXCISION  2008   CHEST AREA  . NECK SURGERY  Nov 09 2011   LYMPHNODES REMOVED 2 POSITIVE   . RIGHT COLECTOMY  06/02/2009  . THYROIDECTOMY  11/06/2009  . TONSILLECTOMY AND ADENOIDECTOMY      SOCIAL HISTORY: Social History   Socioeconomic History  . Marital  status: Married    Spouse name: Not on file  . Number of children: Not on file  . Years of education: Not on file  . Highest education level: Not on file  Occupational History  . Not on file  Tobacco Use  . Smoking status: Former Smoker    Quit date: 08/10/1960    Years since quitting: 60.0  . Smokeless tobacco: Never Used  Vaping Use  . Vaping Use: Never used  Substance and Sexual Activity  . Alcohol use: No    Alcohol/week: 0.0 standard drinks  . Drug use: No  . Sexual activity: Never  Other Topics Concern  . Not on file  Social History Narrative  . Not on file   Social Determinants of Health   Financial Resource Strain:   . Difficulty of Paying Living Expenses: Not on file  Food Insecurity:   . Worried About Charity fundraiser in the Last Year: Not on file  . Ran Out of Food in the Last Year: Not on file  Transportation Needs:   .  Lack of Transportation (Medical): Not on file  . Lack of Transportation (Non-Medical): Not on file  Physical Activity:   . Days of Exercise per Week: Not on file  . Minutes of Exercise per Session: Not on file  Stress:   . Feeling of Stress : Not on file  Social Connections:   . Frequency of Communication with Friends and Family: Not on file  . Frequency of Social Gatherings with Friends and Family: Not on file  . Attends Religious Services: Not on file  . Active Member of Clubs or Organizations: Not on file  . Attends Archivist Meetings: Not on file  . Marital Status: Not on file  Intimate Partner Violence:   . Fear of Current or Ex-Partner: Not on file  . Emotionally Abused: Not on file  . Physically Abused: Not on file  . Sexually Abused: Not on file    FAMILY HISTORY: Family History  Problem Relation Age of Onset  . Cancer Father        throat/ bone  . Breast cancer Sister   . Cancer Sister        MEDULAR CANCER  . Cancer Brother        prostate  . Cancer Other        MEDULAR CANCER  . Breast cancer  Maternal Aunt   . Cancer Paternal Aunt        pancreatic   . Cancer Cousin        ON MOTHER'S SIDE- LIVER CANCER    ALLERGIES:  is allergic to codeine.  MEDICATIONS:  Current Outpatient Medications  Medication Sig Dispense Refill  . ALPRAZolam (XANAX) 1 MG tablet Take 0.5 tablets (0.5 mg total) by mouth 2 (two) times daily. 6 tablet 0  . Calcium Carbonate-Vit D-Min (CALCIUM 600 + MINERALS PO) Take 1 tablet by mouth daily.     . cetirizine (ZYRTEC) 10 MG tablet Take 10 mg by mouth daily.    . Cholecalciferol (VITAMIN D) 1000 UNITS capsule Take 1,000 Units by mouth daily.      . cholestyramine (QUESTRAN) 4 g packet Take 1 packet (4 g total) by mouth 2 (two) times daily. 180 each 3  . Cod Liver Oil 1000 MG CAPS Take 1,000 mg by mouth daily.     . Glucosamine-Chondroitin 250-200 MG CAPS Take 1 tablet by mouth daily. Name of table is SCHIFF    . levothyroxine (SYNTHROID) 125 MCG tablet Take 1 tablet (125 mcg total) by mouth as directed. 2 tablets on sundays and 1 tablet rest of the week 104 tablet 3  . loperamide (IMODIUM A-D) 2 MG tablet Take 2 mg by mouth 3 (three) times daily.    . Multiple Vitamin (MULTIVITAMIN) tablet Take 1 tablet by mouth daily.      . pantoprazole (PROTONIX) 40 MG tablet Take 40 mg by mouth daily.    . simethicone (MYLICON) 80 MG chewable tablet Chew 80 mg by mouth 3 (three) times daily.     . zoledronic acid (RECLAST) 5 MG/100ML SOLN Inject 5 mg into the vein once.       No current facility-administered medications for this visit.    REVIEW OF SYSTEMS:   A 10+ POINT REVIEW OF SYSTEMS WAS OBTAINED including neurology, dermatology, psychiatry, cardiac, respiratory, lymph, extremities, GI, GU, Musculoskeletal, constitutional, breasts, reproductive, HEENT.  All pertinent positives are noted in the HPI.  All others are negative.   PHYSICAL EXAMINATION: ECOG PERFORMANCE STATUS: 1 - Symptomatic but completely ambulatory  There were no vitals filed for this visit.    Telehealth visit  LABORATORY DATA:  I have reviewed the data as listed  . CBC Latest Ref Rng & Units 06/22/2020 01/06/2020 06/14/2019  WBC 4.0 - 10.5 K/uL 14.5(H) 6.2 6.1  Hemoglobin 12.0 - 15.0 g/dL 14.5 13.8 13.6  Hematocrit 36 - 46 % 43.8 42.2 40.8  Platelets 150 - 400 K/uL 201 203 207  hgb 13.5  . CMP Latest Ref Rng & Units 06/22/2020 01/06/2020 12/06/2019  Glucose 70 - 99 mg/dL 118(H) 87 82  BUN 8 - 23 mg/dL _0 Creatinine 0.44 - 1.00 mg/dL 0.95 0.75 0.82  Sodium 135 - 145 mmol/L 139 142 143  Potassium 3.5 - 5.1 mmol/L 4.2 4.7 3.8  Chloride 98 - 111 mmol/L 102 104 102  CO2 22 - 32 mmol/L _1 Calcium 8.9 - 10.3 mg/dL 10.1 9.5 9.5  Total Protein 6.5 - 8.1 g/dL 7.3 7.0 7.6  Total Bilirubin 0.3 - 1.2 mg/dL 1.6(H) 0.8 1.0  Alkaline Phos 38 - 126 U/L 90 79 76  AST 15 - 41 U/L _2 ALT 0 - 44 U/L _3 02/12/18 Calcitonin:     PATHOLOGY  Lung Biopsy at [facility] 09/23/14 DIAGNOSIS A. Pleural nodule, biopsy:  Calcified and hyalinized remote fat necrosis. No evidence of malignancy.   B. Level 9 lymph node, biopsy:  Metastatic medullary thyroid carcinoma. See comment.  Comment:: A note is made of the patient's history of medullary thyroid carcinoma with documented prior metastatic disease to cervical lymph nodes ([id number]; 04/05/2010).  The prior metastases to cervical lymph nodes are reviewed in conjunction to the current case and the tumors are morphologically similar.   Confirmatory immunohistochemical stains are performed. Tumor cells stain positively for calcitonin and for an anticytokeratin cocktail. The morphologic and immunophenotypic findings support metastatic medullary thyroid carcinoma.   C. Level 8 lymph node, biopsy:  Profiles of lymph nodes, negative for malignancy.   D. Pleural nodule #2, biopsy:  Fibrovascular tissue with extensive cauterization. No evidence of malignancy.        Surgical Pathology at [facility]  11/08/12 Immunohistochemical Findings An immunohistochemical stain for calcitonin obtained on paraffin block A1 is positive.   Diagnosis A. "RIGHT LEVEL 6 LYMPH NODES" (BIOPSY):  METASTATIC MEDULLARY THYROID CARCINOMA IN TWO LYMPH NODES (2/2). SIZE OF LARGEST METASTASIS:2 CM. EXTRANODAL INVASION:PRESENT. Comment:  I certify that I personally conducted the diagnostic evaluation of the above  specimen(s) and have rendered the above diagnosis(es). Rex C. Telford Nab, M.D. [phone] Electronically signed: 11/16/12   A. "Right level 6 lymph nodes", received fresh and placed in formalin on 1/9/ two fragments of tan-red soft tissue.Fragment 1 is 1.3 x 1 x 0.6 cm and is bisected and submitted in block A1.Fragment 2 is 2 x 1 x 0.4 cm and is submitted entirely in block A2.    RADIOGRAPHIC STUDIES: I have personally reviewed the radiological images as listed and agreed with the findings in the report. CT Soft Tissue Neck W Contrast  Result Date: 07/18/2020 CLINICAL DATA:  Metastatic medullary thyroid cancer with increasing dysphagia. EXAM: CT NECK WITH CONTRAST TECHNIQUE: Multidetector CT imaging of the neck was performed using the standard protocol following the bolus administration of intravenous contrast. CONTRAST:  26m OMNIPAQUE IOHEXOL 300 MG/ML  SOLN COMPARISON:  CT neck 12/06/2019 FINDINGS: Pharynx and larynx: Normal. No mass or swelling. Salivary glands: No inflammation, mass, or stone. Thyroid: Postop thyroidectomy. No recurrent tumor in  the thyroid bed. Lymph nodes: Pathologic lymph nodes in the right lower neck compatible with metastatic disease. Many of these have calcification. Lymph nodes are stable since the prior study. 9 mm lymph node anterior to the proximal right common carotid artery unchanged Cluster of pathologic lymph nodes in the right supraclavicular region many which have calcification  unchanged. These measure up to 13 mm. Pathologic lymph nodes extend into the superior mediastinum in the right paratracheal region as well as in the precarinal region. These are stable since the prior chest CT of 12/06/2019 Vascular: Atherosclerotic calcification aortic arch and proximal great vessels. No vascular occlusion. Limited intracranial: Negative Visualized orbits: Negative for mass lesion. Bilateral cataract extraction Mastoids and visualized paranasal sinuses: Paranasal sinuses clear. Chronic mastoiditis bilaterally. Mild left mastoid effusion. Skeleton: Multiple sclerotic lesions in the thoracic and cervical spine compatible with bony metastatic disease. No fracture. Stable appearance. Upper chest: Pleuroparenchymal scarring in the apices bilaterally right greater than left. Chronic calcification in the apical parenchymal density bilaterally is stable. Other: None IMPRESSION: Postop thyroidectomy for medullary cancer of the thyroid. Metastatic lymph nodes in the right lower neck extending into the superior mediastinum appear stable from the prior CT. No new or progressive adenopathy. Sclerotic metastatic disease in the cervicothoracic spine stable from the prior study. Electronically Signed   By: Franchot Gallo M.D.   On: 07/18/2020 07:10     CT Chest w Contrast w 3D MIPS Protocol at [facility] 09/29/17 Impression: 1.Mildly enlarged mediastinal lymph nodes are stable when compared to most recent CT. Although, several nodes are minimally increased from 03/08/2016.  2.Refer to separately dictated abdominal CT report for findings below the diaphragm.  Bone Scan Whole Body at [facility] 09/27/17 Impression: 1. Stable foci of increased radiotracer activity in the right frontal calvarium, indeterminate. 2. No new scintigraphic evidence of osseous metastasis.  US Lymph Node Neck Mapping at [facility] 08/29/17 Impression: 1.  Newly measured right level 5 lymph node (lymph node #5) with abnormal rounded  morphology, no echogenic hilum, and microcalcifications. Although biopsy of this nodule may be technically difficult due to its location deep to the carotid vasculature, ultrasound-guided biopsy is recommended (if possible).  2.  Newly measured right level 2A lymph node without suspicious features.  3.  Additional right neck nodes are unchanged.   Bone Density Scan 02/27/17 @ [facility]  Lowest Site Measured: Total Left hip with T-score of -3.1 and BMD of 0.568   CT CAP W Contrast W MIPS at [facility] 02/13/17 Impression: 1. Please see CT neck completed same day for findings in the neck. 2. Stable nonenlarged mediastinal lymph nodes and soft tissue nodule in the right thyroidectomy bed. 3. Unchanged irregular opacities in the lung apices bilaterally, which may represent treated metastases or scarring. No new metastatic disease in the chest, abdomen, or pelvis.  CT Nect Soft Tissue W Contrast at [facility] 02/13/17 IMPRESSION: Numerous soft tissue enhancing lesions in the superior mediastinum. While these are stable when compared to most recent CT, several of these lesions demonstrate minimal growth from the more remote exam on 09/07/2015. No new enhancing lesions are present.  ASSESSMENT & PLAN:   Shannon [name] is a 79 y.o. caucasian female with   1. 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  She had a level 6 LN dissection on 11/08/12 with 2/2 LN positive, largest 2cm for MTC.  09/05/14 CAP CT with development of new calcified nodules in the left lower lobe 09/23/14 Lung biopsy showed Level 9 lymph node, positive for malignancy and metastatic cancer which was resected. Treatment for symptomatic disease with vandetanib and cabozantibnib were previously discussed with her 34 Oncologist but the patient had decided to hold off on  treatment due to concerns for toxicities and lack of overt cancer related progressive symptoms. Latest scans from 08/2017 are stable, no disease found below chest or in the bones. Her calcitonin levels have been increasing since 2015. Overall pt currently asymptomatic.   CT chest on 10/17/2018 which revealed "1. Unchanged mediastinal and right supraclavicular lymphadenopathy. Findings are nonspecific, however metastatic disease is not excluded. 2. Stable biapical scarring and right apical consolidative focus with associated calcification, likely postinfectious in nature."  10/11/2018 CT angiography chest w contrast revealed "1. No demonstrable pulmonary embolus. No thoracic aortic aneurysm or dissection. There are foci of aortic atherosclerosis as well as great vessel and coronary artery calcifications. 2. Fibrosis and cicatrization in the apices with apical pleural calcification, likely due to scarring. There is focal airspace opacity which is asymmetric in the apical segment of the right upper lobe. This finding may well be due to chronic scarring. A potential focus of pneumonia or possible neoplastic involvement in this area must be of concern. This finding may well warrant correlation with nuclear medicine PET study to assess for abnormal metabolic activity. At a minimum, a follow-up chest CT in 3 months to assess for stability of this area would be advised. No other airspace consolidation noted. Areas of patchy atelectasis noted. No well-defined nodular type lesions noted in the lung parenchyma. 3. Probable sclerotic bony metastases at C3 and L1. Sclerotic focus at L1 is incompletely visualized. Medullary thyroid carcinoma as well as breast carcinoma potentially may present with sclerotic as opposed to lytic metastases. 4.  No demonstrable thoracic adenopathy. 5. Extensive postoperative change in the thyroid region with evidence of total thyroidectomy. 6. Mild reflux into the inferior vena cava and hepatic  veins suggests increase in right heart pressure. Aortic Atherosclerosis (ICD10-I70.0)."  12/06/2019 CT C/A/P ([id number]) ([id number]) revealed "1. Stable exam. No new or progressive findings to suggest progressive metastatic disease. 2. Scattered sclerotic bone lesions, similar to prior. 3. Stable mild intrahepatic and extrahepatic biliary duct dilatation. 4. Trace free fluid in the cul-de-sac."  12/06/2019 CT Soft Tissue Neck ([id number]) revealed "1. Thyroidectomy with nodal metastases at the thoracic inlet/anterior compartment and right supraclavicular fossa. There are no prior neck CT comparison is available for review. 2. Spinal osseous metastatic disease without visible extraosseous tumor extension."  06/22/2020 CT C/A/P ([id number]) ([id number]) revealed "1. Borderline enlarged supraclavicular and mediastinal lymph nodes, stable. 2. Osseous metastatic disease, stable. 3. Left lower lobe aspiration and trace left pleural fluid. 4. Hepatomegaly. 5. Aortic atherosclerosis."  2. 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 -Secondary to hypercalcitoninemia  -Currently on imodium daily, cholestyramine twice daily  -was previously on monthly Sandostatin injections in 10/20/17- but patient decided to hold citing no real benefit from adding this -Her last colonoscopy was in 2010  PLAN:  -CT neck reviewed with patient no overt evidence of disease progression in the neck to explain dysphagia. 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 Would not consider this symptomatic progression.  -Advised pt that her swallowing needs to be addressed by ENT and Speech Pathology so that she can maintain nutrition orally.  -RTC with Dr Irene Limbo with labs  in 4 months  3. Osteoporosis - Last DEXA 02/27/17 with Lowest Site Measured: Total Left hip with T-score of -3.1 and BMD of 0.568 Closed left hip fracture from fall -s/p surgery on 10/23/17 with rod  placement - healing well.  -She takes Reclast injections for her osteoporosis. Managed by her PCP   4. Chronic oropharyngeal Dysphagia ? Related to previous neck surgery. Dry mouth etc. No acute changes  Plan -ENT referral for further evaluation of this since patient notes it is bothersome -discussed input regarding mx of dry mouth.   FOLLOW UP: RTC with Dr Irene Limbo with labs in 4 months   The total time spent in the appt was 10 minutes and more than 50% was on counseling and direct patient cares.  All of the patient's questions were answered with apparent satisfaction. The patient knows to call the clinic with any problems, questions or concerns.   Sullivan Lone MD [place] AAHIVMS [facility] Hematology/Oncology Physician [facility]  (Office):       [phone] (Work cell):  [phone] (Fax):           [phone]  08/03/2020 9:14 AM  I, Yevette Edwards, am acting as a scribe for Dr. Sullivan Lone.   .I have reviewed the above documentation for accuracy and completeness, and I agree with the above. Brunetta Genera MD

## 2020-08-06 ENCOUNTER — Telehealth: Payer: Self-pay | Admitting: Hematology

## 2020-08-06 NOTE — Telephone Encounter (Signed)
Patient called regarding upcoming appointments, informed patient about new scheduled appointments.

## 2020-08-10 ENCOUNTER — Other Ambulatory Visit: Payer: Self-pay

## 2020-08-10 ENCOUNTER — Other Ambulatory Visit (INDEPENDENT_AMBULATORY_CARE_PROVIDER_SITE_OTHER): Payer: Medicare Other

## 2020-08-10 DIAGNOSIS — E890 Postprocedural hypothyroidism: Secondary | ICD-10-CM

## 2020-08-12 ENCOUNTER — Other Ambulatory Visit: Payer: Self-pay

## 2020-08-12 ENCOUNTER — Other Ambulatory Visit (HOSPITAL_COMMUNITY): Payer: Self-pay | Admitting: *Deleted

## 2020-08-12 NOTE — Discharge Instructions (Signed)

## 2020-08-13 ENCOUNTER — Other Ambulatory Visit: Payer: Self-pay

## 2020-08-13 ENCOUNTER — Ambulatory Visit (HOSPITAL_COMMUNITY)
Admission: RE | Admit: 2020-08-13 | Discharge: 2020-08-13 | Disposition: A | Discharge status: Home / Self Care | Payer: Medicare Other | Source: Ambulatory Visit | Attending: Family Medicine | Admitting: Family Medicine

## 2020-08-13 DIAGNOSIS — M810 Age-related osteoporosis without current pathological fracture: Secondary | ICD-10-CM | POA: Insufficient documentation

## 2020-08-13 MED ORDER — ZOLEDRONIC ACID 5 MG/100ML IV SOLN
INTRAVENOUS | Status: AC
  Administered 2020-08-13: 5 mg via INTRAVENOUS
  Filled 2020-08-13: qty 100

## 2020-08-13 MED ORDER — ZOLEDRONIC ACID 5 MG/100ML IV SOLN: 5.0000 mg | Freq: Once | INTRAVENOUS | Status: AC

## 2020-08-14 ENCOUNTER — Other Ambulatory Visit: Payer: Self-pay | Admitting: Internal Medicine

## 2020-08-14 DIAGNOSIS — E3122 Multiple endocrine neoplasia [MEN] type IIA: Secondary | ICD-10-CM

## 2020-08-14 NOTE — Progress Notes (Signed)
etan

## 2020-08-24 ENCOUNTER — Other Ambulatory Visit: Payer: Self-pay | Admitting: Internal Medicine

## 2020-08-24 ENCOUNTER — Other Ambulatory Visit: Payer: Self-pay

## 2020-08-24 ENCOUNTER — Other Ambulatory Visit (INDEPENDENT_AMBULATORY_CARE_PROVIDER_SITE_OTHER): Payer: Medicare Other

## 2020-08-24 DIAGNOSIS — E3122 Multiple endocrine neoplasia [MEN] type IIA: Secondary | ICD-10-CM | POA: Diagnosis not present

## 2020-08-24 LAB — TSH: TSH: 2.5 u[IU]/mL (ref 0.35–4.50)

## 2020-08-25 ENCOUNTER — Encounter: Payer: Self-pay | Admitting: Internal Medicine

## 2020-08-27 LAB — METANEPHRINES, URINE, 24 HOUR
Metaneph Total, Ur: 785 mcg/24 h (ref 224–832)
Metanephrines, Ur: 273 mcg/24 h (ref 90–315)
Normetanephrine, 24H Ur: 512 mcg/24 h (ref 122–676)
Volume, Urine-VMAUR: 1000 mL

## 2020-08-27 LAB — CATECHOLAMINES, FRACTIONATED, URINE, 24 HOUR
Calc Total (E+NE): 62 mcg/24 h (ref 26–121)
Creatinine, Urine mg/day-CATEUR: 0.65 g/(24.h) (ref 0.50–2.15)
Dopamine 24 Hr Urine: 50 mcg/24 h — ABNORMAL LOW (ref 52–480)
Epinephrine, 24H, Ur: 5 mcg/24 h (ref 2–24)
Norepinephrine, 24H, Ur: 57 mcg/24 h (ref 15–100)
Total Volume: 1000 mL

## 2020-08-28 ENCOUNTER — Encounter: Payer: Self-pay | Admitting: Internal Medicine

## 2020-09-08 ENCOUNTER — Other Ambulatory Visit: Payer: Medicare Other

## 2020-09-21 ENCOUNTER — Other Ambulatory Visit: Payer: Self-pay | Admitting: Internal Medicine

## 2020-09-30 ENCOUNTER — Other Ambulatory Visit: Payer: Self-pay | Admitting: Internal Medicine

## 2020-11-12 ENCOUNTER — Other Ambulatory Visit: Payer: Self-pay | Admitting: *Deleted

## 2020-11-12 ENCOUNTER — Inpatient Hospital Stay: Payer: Medicare Other | Attending: Hematology

## 2020-11-12 ENCOUNTER — Other Ambulatory Visit: Payer: Self-pay

## 2020-11-12 ENCOUNTER — Inpatient Hospital Stay: Payer: Medicare Other | Admitting: Hematology

## 2020-11-12 VITALS — BP 131/67 | HR 101 | Temp 97.8°F | Resp 18 | Ht 63.0 in | Wt 81.9 lb

## 2020-11-12 DIAGNOSIS — R970 Elevated carcinoembryonic antigen [CEA]: Secondary | ICD-10-CM | POA: Insufficient documentation

## 2020-11-12 DIAGNOSIS — E070 Hypersecretion of calcitonin: Secondary | ICD-10-CM

## 2020-11-12 DIAGNOSIS — M810 Age-related osteoporosis without current pathological fracture: Secondary | ICD-10-CM | POA: Insufficient documentation

## 2020-11-12 DIAGNOSIS — C73 Malignant neoplasm of thyroid gland: Secondary | ICD-10-CM

## 2020-11-12 DIAGNOSIS — Z85850 Personal history of malignant neoplasm of thyroid: Secondary | ICD-10-CM | POA: Diagnosis not present

## 2020-11-12 DIAGNOSIS — R1312 Dysphagia, oropharyngeal phase: Secondary | ICD-10-CM | POA: Insufficient documentation

## 2020-11-12 LAB — CMP (CANCER CENTER ONLY)
ALT: 20 U/L (ref 0–44)
AST: 22 U/L (ref 15–41)
Albumin: 4 g/dL (ref 3.5–5.0)
Alkaline Phosphatase: 81 U/L (ref 38–126)
Anion gap: 7 (ref 5–15)
BUN: 17 mg/dL (ref 8–23)
CO2: 30 mmol/L (ref 22–32)
Calcium: 9.7 mg/dL (ref 8.9–10.3)
Chloride: 105 mmol/L (ref 98–111)
Creatinine: 0.72 mg/dL (ref 0.44–1.00)
GFR, Estimated: >60 mL/min (ref >60)
Glucose, Bld: 97 mg/dL (ref 70–99)
Potassium: 4.5 mmol/L (ref 3.5–5.1)
Sodium: 142 mmol/L (ref 135–145)
Total Bilirubin: 0.9 mg/dL (ref 0.3–1.2)
Total Protein: 6.8 g/dL (ref 6.5–8.1)

## 2020-11-12 LAB — CBC WITH DIFFERENTIAL (CANCER CENTER ONLY)
Abs Immature Granulocytes: 0.01 10*3/uL (ref 0.00–0.07)
Basophils Absolute: 0.1 10*3/uL (ref 0.0–0.1)
Basophils Relative: 1 %
Eosinophils Absolute: 0.1 10*3/uL (ref 0.0–0.5)
Eosinophils Relative: 1 %
HCT: 40.4 % (ref 36.0–46.0)
Hemoglobin: 13.5 g/dL (ref 12.0–15.0)
Immature Granulocytes: 0 %
Lymphocytes Relative: 11 %
Lymphs Abs: 0.6 10*3/uL — ABNORMAL LOW (ref 0.7–4.0)
MCH: 29.2 pg (ref 26.0–34.0)
MCHC: 33.4 g/dL (ref 30.0–36.0)
MCV: 87.3 fL (ref 80.0–100.0)
Monocytes Absolute: 0.5 10*3/uL (ref 0.1–1.0)
Monocytes Relative: 10 %
Neutro Abs: 4.1 10*3/uL (ref 1.7–7.7)
Neutrophils Relative %: 77 %
Platelet Count: 209 10*3/uL (ref 150–400)
RBC: 4.63 MIL/uL (ref 3.87–5.11)
RDW: 13.2 % (ref 11.5–15.5)
WBC Count: 5.3 10*3/uL (ref 4.0–10.5)
nRBC: 0 % (ref 0.0–0.2)

## 2020-11-12 LAB — CEA (IN HOUSE-CHCC): CEA (CHCC-In House): 129.28 ng/mL — ABNORMAL HIGH (ref 0.00–5.00)

## 2020-11-12 NOTE — Progress Notes (Signed)
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 total thyroidectomy in January of 2011 and bilateral neck dissection in June of 2011.  B. Persistent hypercalcitoninemia postoperatively. Calcitonin 499 in 07/2011  C. December 2012: CT of neck/chest was negative for metastases D. She had a normal neck ultrasound in 01/2012 showing her cystic LN was smaller (the one biopsied and negative).  E. FDG PET 08/2012 due to her last calcitonin raising substantially to 739 from the 500s: concerning right cervical lymph node F. 11/08/2012: Level 6 LN dissection. She had 2/2 LN positive, largest 2cm for MTC. Calcitonin one week later from her surgery was down to 515.  G. Calcitonin 592 on 05/24/2013. Repeat ultrasound on that same day which showed 2 at least partially cystic lymph nodes at level 4 which had been previously biopsied and negative; decreased in size since 2012. It also showed a subcentimeter right level VB LN of low suspicion. H. Her metanephrines (plasma) and PTH were normal in 12/2013 Urine studies for pheo were negative in 12/2013. I. 08/04/2014: CEA 9.0; calcitonin 1019 J. 09/05/2014:  - C/A/P CT scan: Interval development of a new calcified nodule in the left lower lobe. - Bone scan: Slight asymmetric increased radiotracer uptake in the right lateral calvarium on the frontal radiograph - Neck U/S: Stable right lateral compartment level 4 and 5B lymph nodes. The cystic right level 4 lymph node, previously biopsied on 01/14/2011 with negative pathology results.  K. 09/23/2014: Thoracoscopy: level 9 LN: Metastatic medullary thyroid carcinoma.  L. 03/11/2015:  CEA 15.9; calcitonin 1149 M 04-13-2015:  - C/A/P CT scan: Previously reported left lower lobe pulmonary nodule is no longer identified. No new or enlarging pulmonary nodules. No evidence of metastatic disease in the abdomen or pelvis. - CT brain:No acute intracranial abnormalities.  New small CSF density subdural fluid collection overlying the right cerebral hemisphere, likely a small hygroma of uncertain etiology. There is no associated bony or parenchymal lesion. 04-13-2015: Bone scan: A non-specific, very small focus of increased tracer activity in the right frontal calvarium is not significantly changed from the prior study. - Neck U/S: There is hypoechoic lesion with questionable microcalcification located in right level IV, it is 0.4 x 0.6 x 0.9 cm. No other pathologic cervical lymphadenopathy seen.  N. 04/13/2015: CEA 14.6; calcitonin 1324 O. 07/16/2015 ([facility]): CEA 19.4 (outsode of [facility]); CAlcitonin 1860. P. 09-07-2015: Neck, Chest , Abd, [place] CT: Stable post-thyroidectomy changes without evidence of recurrent or metastatic disease. Stable pulmonary nodules. Hepatic cysts. No bone mets. CEA: 16; Calcitonin 1533 Q. 03-08-2016: C/A/P CT scan: No definite evidence of metastatic disease in the chest, abdomen or pelvis. Compared to 09/07/2015, there is a new linear 3 mm right lower lobe pulmonary nodule which is indeterminate and may represent focal atelectasis CEA 16.5; CAlcitonin 1839  R. 08-22-2016: Calcitonin: 4596; CEA 27.1  S. 09-06-2016:  - Bone scan: Redemonstrated likely two foci of increased radiotracer uptake within the right calvarium, similar to prior study. - Neck CT: No evidence of recurrent or metastatic disease in the neck. - C/A/P CT: No definite evidence of metastatic disease in the chest, abdomen, or pelvis. - Neck CT: Status post thyroidectomy  and lymph node dissection without evidence of recurrent or metastatic disease.  T. Bone scan: A focus of increased tracer activity  projecting over the posterior right approximate 8th rib is non-specific; this finding may relate to summation of tracer activity in the rib with the inferior tip of the overlying right scapula, post-traumatic change or skeletal metastasis; recommend attention on follow-up. Re-demonstration of subtly increased tracer activity in the right frontal calvarium, stable since 09/05/2014. U. 02-13-2017:  - C/A/P CT scan: nonenlarged mediastinal lymph nodes and soft tissue nodule in the right thyroidectomy bed. Unchanged irregular opacities in the lung apices bilaterally, which may represent treated metastases or scarring.  - Neck CT: Numerous soft tissue enhancing lesions in the superior mediastinum. While these are stable when compared to most recent CT, several of these lesions demonstrate minimal growth from the more remote exam on 09/07/2015. No new enhancing lesions are present. - CEA 25.1, Calcitonin: 5498 V. 08-28-2017: fractionate plasma free metanephrines: Normetanephrine 178 (24hr urine pending as this was thought to be non-specific), Metanephrine: 44 (normal). CEA 46; Calcitonin 9763 W. 09-27-2017: Bone scan: Stable increased radiotracer activity in the right frontal calvarium, indeterminate. No other evidence of osseous metastasis Chest CT/Abd: Mildly enlarged mediastinal lymph nodes are stable. No evidence of metastatic disease in the abdomen   HISTORY OF PRESENTING ILLNESS:   Shannon [name] is a wonderful 80 y.o. female who has been referred to [place] by [place] oncologist Dr. Leslie Andrea for management of Metastatic Medullary Thyroid Cancer. She is looking for a Financial controller in [place].  Today she is accompanied by her sister. Patient notes this started with a nodule in her throat and her endocrinologist started further work up. She has been seeing Med onc at [facility] since her first surgery every 3-6 months.   She has had thyroid surgery and Ldissection but no systemic therapy as  detailed above in the oncologic history.  She notes her family had testing for MEN2A mutation. Her sister, her nephew carry the gene. She notes her maternal aunt had goiters but not sure of the cause.   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 She has had diarrhea since 2010. Her last colonoscopy was in 2010. She has tried multiple treatment options but this continues to worsen over time. She currently takes imodium 4-6 X daily, cholestyramine twice daily. She started monthly Sandostatin on 10/20/17. She fell and broke her hip on 10/23/17 and had surgery same day. She was given hydrocodone which helped her diarrhea and is not sure if the Sandostatin is working for her. She has been on oral iron and she is on aspirin 327m since her hip surgery. She does PT exercises and has been healing well.    She recently stopped caffeine tablets that she was taking for energy. She stopped 3 months ago. She is on Reclast for her osteoporosis, which is managed by her PCP, Dr. KDeatra Ina Pt notes she stopped smoking about 40 years ago and she rarely drinks alcohol.   On review of symptoms, pt notes continued diarrhea, stable energy, weight loss of 6-8 pounds in the last 6 months. Her appetite is adequate. Her diarrhea occurs currently twice a day with "pudding" form to liquid form stool. She can go up to 5-7 times a day at times. She denies cramping with diarrhea. She denies blood, and mucus in stool. Current use of oral iron causes some black stool, which was not present before use.   INTERVAL HISTORY:  Shannon [name] returns today regarding her Metastatic Medullary Thyroid Cancer. The patient's last visit with [place] was on 08/03/2020. The pt reports that she is doing well overall.  The pt reports continued weight loss of about 5 lbs in the last 3 months. Diarrhea controlled with prn imodium  Lab results today (11/12/20) of CBC w/diff and CMP -stable 11/12/2020 CEA is in progress  increased to 129 Calcitonin increaed to 39.4k   MEDICAL HISTORY:  Past Medical History:  Diagnosis Date  . Anxiety   . Breast cancer ([place]) 1999   left lumpectomy/radiation/ductal carcinoma in situ  . Cancer ([place])   . Colon polyp   . Contact lens/glasses fitting    HAS LENS IMPLANTS  . Lung cancer ([place]) 2015   LYMPHNODE REMOVAL   . Medullary carcinoma of thyroid ([place]) 2011  . Osteoporosis     SURGICAL HISTORY: Past Surgical History:  Procedure Laterality Date  . ABDOMINAL HYSTERECTOMY    . ABDOMINAL SURGERY  1989   RUPTURED APPENDIX  . BREAST SURGERY  1999   LEFT BREAST LUMPECTOMY. DUCTAL CARCINOMA IN SITU./ RADIATION  . CATARACT EXTRACTION W/ INTRAOCULAR LENS IMPLANT  2009   BOTH EYES  . FEMUR IM NAIL Left 10/23/2017   Procedure: INTRAMEDULLARY (IM) NAIL FEMORAL;  Surgeon: Paralee Cancel, MD;  Location: [facility];  Service: Orthopedics;  Laterality: Left;  . FOOT SURGERY  1070   RIGHT FOOT - PINCHED NERVE  . LUNG SURGERY     CALCIFICATIONS, POSITIVE - CANCER LYMPHNODE   . MELANOMA EXCISION  2008   CHEST AREA  . NECK SURGERY  Nov 09 2011   LYMPHNODES REMOVED 2 POSITIVE   . RIGHT COLECTOMY  06/02/2009  . THYROIDECTOMY  11/06/2009  . TONSILLECTOMY AND ADENOIDECTOMY      SOCIAL HISTORY: Social History   Socioeconomic History  . Marital status: Married    Spouse name: Not on file  . Number of children: Not on file  . Years of education: Not on file  . Highest education level: Not on file  Occupational History  . Not on file  Tobacco Use  . Smoking status: Former Smoker    Quit date: 08/10/1960    Years since quitting: 60.2  . Smokeless tobacco: Never Used  Vaping Use  . Vaping Use: Never used  Substance and Sexual Activity  . Alcohol use: No    Alcohol/week: 0.0 standard drinks  . Drug use: No  . Sexual activity: Never  Other Topics Concern  . Not on file  Social History Narrative  . Not on file   Social Determinants of Health   Financial Resource Strain:  Not on file  Food Insecurity: Not on file  Transportation Needs: Not on file  Physical Activity: Not on file  Stress: Not on file  Social Connections: Not on file  Intimate Partner Violence: Not on file    FAMILY HISTORY: Family History  Problem Relation Age of Onset  . Cancer Father        throat/ bone  . Breast cancer Sister   . Cancer Sister        MEDULAR CANCER  . Cancer Brother        prostate  . Cancer Other        MEDULAR CANCER  . Breast cancer Maternal Aunt   . Cancer Paternal Aunt        pancreatic   . Cancer Cousin        ON MOTHER'S SIDE- LIVER CANCER  ALLERGIES:  is allergic to codeine.  MEDICATIONS:  Current Outpatient Medications  Medication Sig Dispense Refill  . ALPRAZolam (XANAX) 1 MG tablet Take 0.5 tablets (0.5 mg total) by mouth 2 (two) times daily. 6 tablet 0  . Calcium Carbonate-Vit D-Min (CALCIUM 600 + MINERALS PO) Take 1 tablet by mouth daily.     . cetirizine (ZYRTEC) 10 MG tablet Take 10 mg by mouth daily.    . Cholecalciferol (VITAMIN D) 1000 UNITS capsule Take 1,000 Units by mouth daily.      . cholestyramine (QUESTRAN) 4 g packet Take 1 packet (4 g total) by mouth 2 (two) times daily. 180 each 3  . Cod Liver Oil 1000 MG CAPS Take 1,000 mg by mouth daily.     . Glucosamine-Chondroitin 250-200 MG CAPS Take 1 tablet by mouth daily. Name of table is SCHIFF    . levothyroxine (SYNTHROID) 125 MCG tablet Take 1 tablet (125 mcg total) by mouth as directed. 2 tablets on sundays and 1 tablet rest of the week 104 tablet 3  . loperamide (IMODIUM A-D) 2 MG tablet Take 2 mg by mouth 3 (three) times daily.    . Multiple Vitamin (MULTIVITAMIN) tablet Take 1 tablet by mouth daily.      . pantoprazole (PROTONIX) 40 MG tablet Take 40 mg by mouth daily.    . simethicone (MYLICON) 80 MG chewable tablet Chew 80 mg by mouth 3 (three) times daily.     . zoledronic acid (RECLAST) 5 MG/100ML SOLN Inject 5 mg into the vein once.       No current  facility-administered medications for this visit.    REVIEW OF SYSTEMS:   A 10+ POINT REVIEW OF SYSTEMS WAS OBTAINED including neurology, dermatology, psychiatry, cardiac, respiratory, lymph, extremities, GI, GU, Musculoskeletal, constitutional, breasts, reproductive, HEENT.  All pertinent positives are noted in the HPI.  All others are negative.   PHYSICAL EXAMINATION: ECOG PERFORMANCE STATUS: 1 - Symptomatic but completely ambulatory   There were no vitals filed for this visit.   NAD GENERAL:alert, in no acute distress and comfortable SKIN: no acute rashes, no significant lesions EYES: conjunctiva are pink and non-injected, sclera anicteric OROPHARYNX: MMM, no exudates, no oropharyngeal erythema or ulceration NECK: supple, no JVD LYMPH:  no palpable lymphadenopathy in the cervical, axillary or inguinal regions LUNGS: clear to auscultation b/l with normal respiratory effort HEART: regular rate & rhythm ABDOMEN:  normoactive bowel sounds , non tender, not distended. No palpable hepatosplenomegaly.  Extremity: no pedal edema PSYCH: alert & oriented x 3 with fluent speech NEURO: no focal motor/sensory deficits  LABORATORY DATA:  I have reviewed the data as listed  . CBC Latest Ref Rng & Units 06/22/2020 01/06/2020 06/14/2019  WBC 4.0 - 10.5 K/uL 14.5(H) 6.2 6.1  Hemoglobin 12.0 - 15.0 g/dL 14.5 13.8 13.6  Hematocrit 36.0 - 46.0 % 43.8 42.2 40.8  Platelets 150 - 400 K/uL 201 203 207  hgb 13.5  . CMP Latest Ref Rng & Units 06/22/2020 01/06/2020 12/06/2019  Glucose 70 - 99 mg/dL 118(H) 87 82  BUN 8 - 23 mg/dL _0 Creatinine 0.44 - 1.00 mg/dL 0.95 0.75 0.82  Sodium 135 - 145 mmol/L 139 142 143  Potassium 3.5 - 5.1 mmol/L 4.2 4.7 3.8  Chloride 98 - 111 mmol/L 102 104 102  CO2 22 - 32 mmol/L _1 Calcium 8.9 - 10.3 mg/dL 10.1 9.5 9.5  Total Protein 6.5 - 8.1 g/dL 7.3 7.0 7.6  Total Bilirubin  0.3 - 1.2 mg/dL 1.6(H) 0.8 1.0  Alkaline Phos 38 - 126 U/L 90 79 76  AST 15 - 41  U/L _0 ALT 0 - 44 U/L _1 02/12/18 Calcitonin:     PATHOLOGY  Lung Biopsy at [facility] 09/23/14 DIAGNOSIS A. Pleural nodule, biopsy:  Calcified and hyalinized remote fat necrosis. No evidence of malignancy.   B. Level 9 lymph node, biopsy:  Metastatic medullary thyroid carcinoma. See comment.  Comment:: A note is made of the patient's history of medullary thyroid carcinoma with documented prior metastatic disease to cervical lymph nodes ([id number]; 04/05/2010).  The prior metastases to cervical lymph nodes are reviewed in conjunction to the current case and the tumors are morphologically similar.   Confirmatory immunohistochemical stains are performed. Tumor cells stain positively for calcitonin and for an anticytokeratin cocktail. The morphologic and immunophenotypic findings support metastatic medullary thyroid carcinoma.   C. Level 8 lymph node, biopsy:  Profiles of lymph nodes, negative for malignancy.   D. Pleural nodule #2, biopsy:  Fibrovascular tissue with extensive cauterization. No evidence of malignancy.        Surgical Pathology at [facility] 11/08/12 Immunohistochemical Findings An immunohistochemical stain for calcitonin obtained on paraffin block A1 is positive.   Diagnosis A. "RIGHT LEVEL 6 LYMPH NODES" (BIOPSY):  METASTATIC MEDULLARY THYROID CARCINOMA IN TWO LYMPH NODES (2/2). SIZE OF LARGEST METASTASIS:2 CM. EXTRANODAL INVASION:PRESENT. Comment:  I certify that I personally conducted the diagnostic evaluation of the above  specimen(s) and have rendered the above diagnosis(es). Rex C. Telford Nab, M.D. Telephone:[phone] Electronically signed: 11/16/12   A. "Right level 6 lymph nodes", received fresh and placed in formalin on 1/9/ two fragments of tan-red soft tissue.Fragment 1 is 1.3 x 1 x 0.6 cm and is bisected and submitted in block A1.Fragment 2 is 2 x 1 x 0.4 cm  and is submitted entirely in block A2.    RADIOGRAPHIC STUDIES: I have personally reviewed the radiological images as listed and agreed with the findings in the report. No results found.   CT Chest w Contrast w 3D MIPS Protocol at [facility] 09/29/17 Impression: 1.Mildly enlarged mediastinal lymph nodes are stable when compared to most recent CT. Although, several nodes are minimally increased from 03/08/2016.  2.Refer to separately dictated abdominal CT report for findings below the diaphragm.  Bone Scan Whole Body at [facility] 09/27/17 Impression: 1. Stable foci of increased radiotracer activity in the right frontal calvarium, indeterminate. 2. No new scintigraphic evidence of osseous metastasis.  US Lymph Node Neck Mapping at [facility] 08/29/17 Impression: 1.  Newly measured right level 5 lymph node (lymph node #5) with abnormal rounded morphology, no echogenic hilum, and microcalcifications. Although biopsy of this nodule may be technically difficult due to its location deep to the carotid vasculature, ultrasound-guided biopsy is recommended (if possible).  2.  Newly measured right level 2A lymph node without suspicious features.  3.  Additional right neck nodes are unchanged.   Bone Density Scan 02/27/17 @ [facility]  Lowest Site Measured: Total Left hip with T-score of -3.1 and BMD of 0.568   CT CAP W Contrast W MIPS at [facility] 02/13/17 Impression: 1. Please see CT neck completed same day for findings in the neck. 2. Stable nonenlarged mediastinal lymph nodes and soft tissue nodule in the right thyroidectomy bed. 3. Unchanged irregular opacities in the lung apices bilaterally, which may represent treated metastases or scarring. No new metastatic disease in the chest, abdomen, or pelvis.  CT Nect Soft  Tissue W Contrast at [facility] 02/13/17 IMPRESSION: Numerous soft tissue enhancing lesions in the superior mediastinum. While these are stable when compared to most recent CT, several of these  lesions demonstrate minimal growth from the more remote exam on 09/07/2015. No new enhancing lesions are present.  ASSESSMENT & PLAN:   Shannon [name] is a 80 y.o. caucasian female with   1. 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  She had a level 6 LN dissection on 11/08/12 with 2/2 LN positive, largest 2cm for MTC.  09/05/14 CAP CT with development of new calcified nodules in the left lower lobe 09/23/14 Lung biopsy showed Level 9 lymph node, positive for malignancy and metastatic cancer which was resected. Treatment for symptomatic disease with vandetanib and cabozantibnib were previously discussed with her 19 Oncologist but the patient had decided to hold off on treatment due to concerns for toxicities and lack of overt cancer related progressive symptoms. Latest scans from 08/2017 are stable, no disease found below chest or in the bones. Her calcitonin levels have been increasing since 2015. Overall pt currently asymptomatic.   CT chest on 10/17/2018 which revealed "1. Unchanged mediastinal and right supraclavicular lymphadenopathy. Findings are nonspecific, however metastatic disease is not excluded. 2. Stable biapical scarring and right apical consolidative focus with associated calcification, likely postinfectious in nature."  10/11/2018 CT angiography chest w contrast revealed "1. No demonstrable pulmonary embolus. No thoracic aortic aneurysm or dissection. There are foci of aortic atherosclerosis as well as great vessel and coronary artery calcifications. 2. Fibrosis and cicatrization in the apices with apical pleural calcification, likely due to scarring. There is focal airspace opacity which is asymmetric in the apical segment of the right upper lobe. This finding may well be due to chronic scarring. A potential  focus of pneumonia or possible neoplastic involvement in this area must be of concern. This finding may well warrant correlation with nuclear medicine PET study to assess for abnormal metabolic activity. At a minimum, a follow-up chest CT in 3 months to assess for stability of this area would be advised. No other airspace consolidation noted. Areas of patchy atelectasis noted. No well-defined nodular type lesions noted in the lung parenchyma. 3. Probable sclerotic bony metastases at C3 and L1. Sclerotic focus at L1 is incompletely visualized. Medullary thyroid carcinoma as well as breast carcinoma potentially may present with sclerotic as opposed to lytic metastases. 4.  No demonstrable thoracic adenopathy. 5. Extensive postoperative change in the thyroid region with evidence of total thyroidectomy. 6. Mild reflux into the inferior vena cava and hepatic veins suggests increase in right heart pressure. Aortic Atherosclerosis (ICD10-I70.0)."  12/06/2019 CT C/A/P ([id number]) ([id number]) revealed "1. Stable exam. No new or progressive findings to suggest progressive metastatic disease. 2. Scattered sclerotic bone lesions, similar to prior. 3. Stable mild intrahepatic and extrahepatic biliary duct dilatation. 4. Trace free fluid in the cul-de-sac."  12/06/2019 CT Soft Tissue Neck ([id number]) revealed "1. Thyroidectomy with nodal metastases at the thoracic inlet/anterior compartment and right supraclavicular fossa. There are no prior neck CT comparison is available for review. 2. Spinal osseous metastatic disease without visible extraosseous tumor extension."  06/22/2020 CT C/A/P ([id number]) ([id number]) revealed "1. Borderline enlarged supraclavicular and mediastinal lymph nodes, stable. 2. Osseous metastatic disease, stable. 3. Left lower lobe aspiration and trace left pleural fluid. 4. Hepatomegaly. 5. Aortic atherosclerosis."  2. Chronic diarrhea, range  from loose to liquid stool, 3-4 times a  day. -Secondary to hypercalcitoninemia  -Currently on imodium daily, cholestyramine twice daily  -was previously on monthly Sandostatin injections in 10/20/17- but patient decided to hold citing no real benefit from adding this -Her last colonoscopy was in 2010  PLAN:  -labs discussed with patient -she is continuing to lose weight -- could be from disease progression as suggested by increasing tumor markers. -referral to nutritional therapy -PET/CT to evaluate for disease progression. -if continued wieight loss despite dietary intervention and based on PET will consider starting treatment for symptomatic medullary thyroid cancer.   3. Osteoporosis - Last DEXA 02/27/17 with Lowest Site Measured: Total Left hip with T-score of -3.1 and BMD of 0.568 Closed left hip fracture from fall -s/p surgery on 10/23/17 with rod placement - healing well.  -She takes Reclast injections for her osteoporosis. Managed by her PCP   4. Chronic oropharyngeal Dysphagia ? Related to previous neck surgery. Dry mouth etc. No acute changes  Plan -ENT referral for further evaluation of this since patient notes it is bothersome -discussed input regarding mx of dry mouth.   FOLLOW UP: Referral to nutritonal therapy PET/CT in 11 weeks RTC with Dr Irene Limbo with labs in 12 weeks   The total time spent in the appt was 30 minutes and more than 50% was on counseling and direct patient cares.  All of the patient's questions were answered with apparent satisfaction. The patient knows to call the clinic with any problems, questions or concerns.   Sullivan Lone MD [place] AAHIVMS [facility] Hematology/Oncology Physician [facility]  (Office):       [phone] (Work cell):  [phone] (Fax):           [phone]  11/12/2020 2:38 AM  I, Yevette Edwards, am acting as a scribe for Dr. Sullivan Lone.   .I have reviewed the above documentation for accuracy and completeness, and I agree with the above. Brunetta Genera MD

## 2020-11-13 LAB — CALCITONIN: Calcitonin: 39445 pg/mL — ABNORMAL HIGH (ref 0.0–5.0)

## 2020-11-24 ENCOUNTER — Inpatient Hospital Stay: Payer: Medicare Other | Admitting: Nutrition

## 2020-11-24 ENCOUNTER — Telehealth: Payer: Self-pay | Admitting: Nutrition

## 2020-11-24 NOTE — Telephone Encounter (Signed)
Contacted patient for telephone nutrition consult. Patient did not answer but I was able to leave a message for a return call with name and phone number.

## 2021-01-05 ENCOUNTER — Other Ambulatory Visit: Payer: Self-pay | Admitting: Gastroenterology

## 2021-01-05 ENCOUNTER — Other Ambulatory Visit (HOSPITAL_COMMUNITY): Payer: Self-pay | Admitting: Gastroenterology

## 2021-01-05 DIAGNOSIS — R634 Abnormal weight loss: Secondary | ICD-10-CM

## 2021-01-05 DIAGNOSIS — R1319 Other dysphagia: Secondary | ICD-10-CM

## 2021-01-05 DIAGNOSIS — R627 Adult failure to thrive: Secondary | ICD-10-CM

## 2021-01-07 ENCOUNTER — Ambulatory Visit (HOSPITAL_COMMUNITY): Payer: Medicare Other

## 2021-01-08 ENCOUNTER — Other Ambulatory Visit: Payer: Self-pay

## 2021-01-11 ENCOUNTER — Other Ambulatory Visit (HOSPITAL_COMMUNITY)
Admission: RE | Admit: 2021-01-11 | Discharge: 2021-01-11 | Disposition: A | Discharge status: Home / Self Care | Payer: Medicare Other | Source: Ambulatory Visit | Attending: Gastroenterology | Admitting: Gastroenterology

## 2021-01-11 ENCOUNTER — Ambulatory Visit: Payer: Medicare Other | Admitting: Internal Medicine

## 2021-01-11 ENCOUNTER — Encounter: Payer: Self-pay | Admitting: Internal Medicine

## 2021-01-11 ENCOUNTER — Other Ambulatory Visit: Payer: Self-pay

## 2021-01-11 VITALS — BP 122/70 | HR 87 | Ht 63.0 in | Wt 85.4 lb

## 2021-01-11 DIAGNOSIS — K529 Noninfective gastroenteritis and colitis, unspecified: Secondary | ICD-10-CM

## 2021-01-11 DIAGNOSIS — Z20822 Contact with and (suspected) exposure to covid-19: Secondary | ICD-10-CM | POA: Insufficient documentation

## 2021-01-11 DIAGNOSIS — Z01812 Encounter for preprocedural laboratory examination: Secondary | ICD-10-CM | POA: Insufficient documentation

## 2021-01-11 DIAGNOSIS — E3122 Multiple endocrine neoplasia [MEN] type IIA: Secondary | ICD-10-CM

## 2021-01-11 DIAGNOSIS — E890 Postprocedural hypothyroidism: Secondary | ICD-10-CM | POA: Diagnosis not present

## 2021-01-11 DIAGNOSIS — C801 Malignant (primary) neoplasm, unspecified: Secondary | ICD-10-CM | POA: Diagnosis not present

## 2021-01-11 LAB — SARS CORONAVIRUS 2 (TAT 6-24 HRS): SARS Coronavirus 2: NEGATIVE

## 2021-01-11 NOTE — Progress Notes (Addendum)
Name: Shannon [name]  MRN/ DOB: [id number], 26-Jul-1941    Age/ Sex: 80 y.o., female     PCP: Aletha Halim., PA-C   Reason for Endocrinology Evaluation: MEN2A/Medullary Thyroid Cancer     Initial Endocrinology Clinic Visit: 07/15/2019    PATIENT IDENTIFIER: [name] is a 80 y.o., female with a past medical history of MEN2, osteoporosis and post-surgical hypothyroidism. She has followed with [facility] since 07/15/2019 for consultative assistance with management of her MEN2A.        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   S/P B/L neck dissection 03/2010 S/P redo dissection of the right level VI L.N in 10/2012, largest 2 cm due to mets In 2015  Had a stable right lateral compartment level IV and V L.N . The cystic right level IV L.N previously biopsied 12/2010 negative for mets.  S/P thoracoscopy (08/2014) level IX L.N with medullary mets.    She has had diarrhea in 2010, started on Imodium for years. Somatostatin was not found to be effective.   In 08/2018 was found to have an endometrial mass on MRI, further testing was benign.   She used to follow up at [facility], serial testing has come back negative for pheo or hypercalcemia.    Over the years her calcitonin and CEA levels have been gradually increasing.   She continues with chronic diarrhea, diarrhea has been there since 2010. Treatment was started some time in 2013. She has been on loperamide for years, she takes 2 tabs TID, which is above the max recommended dose.  She has 2-3 bowel movements a day, Loperamide is expensive due to quantity used , so we switched to Lomotil  Stool consistency has improved with consuming protein shakes.     She has been diagnosed with osteoporosis in 2016 and has been on Reclast since then.    SUBJECTIVE:    Today (01/11/2021):  [name] is here for f/u on  medullary carcinoma.    Weight stable Was  evaluated by ENT for hoarseness, laryngoscope was unrevealing, has GERD, she is having a PEG tube this week  Prilosec has been helping   Denies local neck symptoms  Dysphagia is improving   Stools have been normal , cholestyramine and Imodium are helping      She is on calcium BID  Vitamin D 1000 iu daily   She has self reduced her levothyroxine to HALF a tablet daily instead of 2 on sundays and 1 the rest of the week      HISTORY:  Past Medical History:  Past Medical History:  Diagnosis Date  . Anxiety   . Breast cancer ([place]) 1999   left lumpectomy/radiation/ductal carcinoma in situ  . Cancer ([place])   . Colon polyp   . Contact lens/glasses fitting    HAS LENS IMPLANTS  . Lung cancer ([place]) 2015   LYMPHNODE REMOVAL   . 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 Osteoporosis    Past Surgical History:  Past Surgical History:  Procedure Laterality Date  . ABDOMINAL HYSTERECTOMY    . ABDOMINAL SURGERY  1989   RUPTURED APPENDIX  . BREAST SURGERY  1999   LEFT BREAST LUMPECTOMY. DUCTAL CARCINOMA IN SITU./ RADIATION  . CATARACT EXTRACTION W/ INTRAOCULAR LENS IMPLANT  2009   BOTH EYES  . FEMUR IM NAIL Left 10/23/2017   Procedure: INTRAMEDULLARY (IM) NAIL FEMORAL;  Surgeon: Paralee Cancel, MD;  Location: [facility];  Service: Orthopedics;  Laterality: Left;  . FOOT SURGERY  1070   RIGHT FOOT - PINCHED NERVE  . LUNG SURGERY     CALCIFICATIONS, POSITIVE - CANCER LYMPHNODE   . MELANOMA EXCISION  2008   CHEST AREA  . NECK SURGERY  Nov 09 2011   LYMPHNODES REMOVED 2 POSITIVE   . RIGHT COLECTOMY  06/02/2009  . THYROIDECTOMY  11/06/2009  . TONSILLECTOMY AND ADENOIDECTOMY      Social History:  reports that she quit smoking about 60 years ago. She has never used smokeless tobacco. She reports that she does not drink alcohol and does not use drugs. Family History:  Family History  Problem Relation Age of Onset  . Cancer Father         throat/ bone  . Breast cancer Sister   . Cancer Sister        MEDULAR CANCER  . Cancer Brother        prostate  . Cancer Other        MEDULAR CANCER  . Breast cancer Maternal Aunt   . Cancer Paternal Aunt        pancreatic   . Cancer Cousin        ON MOTHER'S SIDE- LIVER CANCER     HOME MEDICATIONS: Allergies as of 01/11/2021      Reactions   Codeine Other (See Comments)   hyper Other reaction(s): insomnia      Medication List       Accurate as of January 11, 2021  2:13 PM. If you have any questions, ask your nurse or doctor.        ALPRAZolam 1 MG tablet Commonly known as: XANAX Take 0.5 tablets (0.5 mg total) by mouth 2 (two) times daily.   bacitracin ophthalmic ointment   Biotin 1000 MCG tablet 1-2 tablets   CALCIUM 600 + MINERALS PO Take 1 tablet by mouth daily.   Calcium Carb-Cholecalciferol 600-200 MG-UNIT Tabs 2 tablets   cetirizine 10 MG tablet Commonly known as: ZYRTEC Take 10 mg by mouth daily.   cholestyramine 4 g packet Commonly known as: QUESTRAN Take 1 packet (4 g total) by mouth 2 (two) times daily.   Cod Liver Oil 1000 MG Caps Take 1,000 mg by mouth daily.   Glucosamine-Chondroitin 250-200 MG Caps Take 1 tablet by mouth daily. Name of table is SCHIFF   levothyroxine 125 MCG tablet Commonly known as: SYNTHROID Take 1 tablet (125 mcg total) by mouth as directed. 2 tablets on sundays and 1 tablet rest of the week What changed: additional instructions   loperamide 2 MG tablet Commonly known as: IMODIUM A-D Take 2 mg by mouth 3 (three) times daily.   multivitamin tablet Take 1 tablet by mouth daily.   pantoprazole 40 MG tablet Commonly known as: PROTONIX Take 40 mg by mouth daily.   simethicone 80 MG chewable tablet Commonly known as: MYLICON Chew 80 mg by mouth 3 (three) times daily.   sucralfate 1 GM/10ML suspension Commonly known as: CARAFATE Take 1 g by mouth 2 (two) times daily.   Tylenol PM Extra Strength 25-500 MG Tabs  tablet Generic drug: diphenhydramine-acetaminophen 1-2 tabs   Vitamin D 1000 units capsule Take 1,000 Units by mouth daily.   vitamin E 180 MG (400 UNITS) capsule 1 tablet   zoledronic acid 5 MG/100ML Soln injection Commonly known as: RECLAST Inject 5 mg into the vein once.  OBJECTIVE:   PHYSICAL EXAM: VS: BP 122/70   Pulse 87   Ht 5' 3"  (1.6 m)   Wt 85 lb 6 oz (38.7 kg)   SpO2 98%   BMI 15.12 kg/m    EXAM: General: Pt appears well and is in NAD  Neck: General: Supple without adenopathy. Thyroid: Thyroid surgically removed, no adenopathy noted. She has a superficial rubbery round lesion at the right supraclavicular area   Lungs: Clear with good BS bilat with no rales, rhonchi, or wheezes  Heart: Auscultation: RRR.  Abdomen: Normoactive bowel sounds, soft, nontender, without masses or organomegaly palpable  Extremities:  BL LE: No pretibial edema normal ROM and strength.  Mental Status: Judgment, insight: Intact Orientation: Oriented to time, place, and person Mood and affect: No depression, anxiety, or agitation     DATA REVIEWED:  Results for EVEY, MCMAHAN (MRN [id number]) as of 01/12/2021 08:51  Ref. Range 08/24/2020 11:07 01/11/2021 14:36  TSH Latest Ref Range: 0.35 - 4.50 uIU/mL 2.50 55.27 (H)   Results for JUANETTE, URIZAR (MRN [id number]) as of 01/11/2021 15:45  Ref. Range 11/12/2020 10:45  Sodium Latest Ref Range: 135 - 145 mmol/L 142  Potassium Latest Ref Range: 3.5 - 5.1 mmol/L 4.5  Chloride Latest Ref Range: 98 - 111 mmol/L 105  CO2 Latest Ref Range: 22 - 32 mmol/L 30  Glucose Latest Ref Range: 70 - 99 mg/dL 97  BUN Latest Ref Range: 8 - 23 mg/dL 17  Creatinine Latest Ref Range: 0.44 - 1.00 mg/dL 0.72  Calcium Latest Ref Range: 8.9 - 10.3 mg/dL 9.7  Anion gap Latest Ref Range: 5 - 15  7  Alkaline Phosphatase Latest Ref Range: 38 - 126 U/L 81  Albumin Latest Ref Range: 3.5 - 5.0 g/dL 4.0  AST Latest Ref Range: 15 - 41 U/L 22  ALT Latest Ref  Range: 0 - 44 U/L 20  Total Protein Latest Ref Range: 6.5 - 8.1 g/dL 6.8  Total Bilirubin Latest Ref Range: 0.3 - 1.2 mg/dL 0.9  GFR, Est Non African American Latest Ref Range: >60 mL/min >60   Results for MARIADELALUZ, GUGGENHEIM (MRN [id number]) as of 01/11/2021 15:45  Ref. Range 07/13/2020 11:36  Dopamine 24 Hr Urine Latest Ref Range: 52 - 480 mcg/24 h 50 (L)  Epinephrine, 24H, Ur Latest Ref Range: 2 - 24 mcg/24 h 5  Metanephrine, Ur Latest Ref Range: 224 - 832 mcg/24 h 785  Metanephrines, Ur Latest Ref Range: 90 - 315 mcg/24 h 273  Norepinephrine, 24H, Ur Latest Ref Range: 15 - 100 mcg/24 h 57  Total Volume Latest Units: mL 1,000  Creatinine, Urine mg/day-CATEUR Latest Ref Range: 0.50 - 2.15 g/24 h 0.65  Normetanephr.,U,24h Latest Ref Range: 122 - 676 mcg/24 h 512  Volume, Urine-VMAUR Latest Units: mL 1,000   CT neck 07/2020 Pharynx and larynx: Normal. No mass or swelling.  Salivary glands: No inflammation, mass, or stone.  Thyroid: Postop thyroidectomy. No recurrent tumor in the thyroid bed.  Lymph nodes: Pathologic lymph nodes in the right lower neck compatible with metastatic disease. Many of these have calcification. Lymph nodes are stable since the prior study.  9 mm lymph node anterior to the proximal right common carotid artery unchanged  Cluster of pathologic lymph nodes in the right supraclavicular region many which have calcification unchanged. These measure up to 13 mm.  Pathologic lymph nodes extend into the superior mediastinum in the right paratracheal region as well as in the precarinal region. These are stable  since the prior chest CT of 12/06/2019  Vascular: Atherosclerotic calcification aortic arch and proximal great vessels. No vascular occlusion.  Limited intracranial: Negative  Visualized orbits: Negative for mass lesion. Bilateral cataract extraction  Mastoids and visualized paranasal sinuses: Paranasal sinuses clear. Chronic mastoiditis  bilaterally. Mild left mastoid effusion.  Skeleton: Multiple sclerotic lesions in the thoracic and cervical spine compatible with bony metastatic disease. No fracture. Stable appearance.  Upper chest: Pleuroparenchymal scarring in the apices bilaterally right greater than left. Chronic calcification in the apical parenchymal density bilaterally is stable.  Other: None  IMPRESSION: Postop thyroidectomy for medullary cancer of the thyroid. Metastatic lymph nodes in the right lower neck extending into the superior mediastinum appear stable from the prior CT. No new or progressive adenopathy.  Sclerotic metastatic disease in the cervicothoracic spine stable from the prior study.   ASSESSMENT / PLAN / RECOMMENDATIONS:   1. MEN2A:  - Pt with medullary carcinoma ,  no clinical or biochemical evidence of pheochromocytoma or hypercalcemia    2. Medullary Carcinoma:   - S/P thyroidectomy in 2011 followed by dissection and redo of the right level VI L.N in 10/2012, she is also S/P thoracoscopy (08/2014) level IX L.N with medullary mets.  - Stable chest/abdomen  imaging 07/2020 with evidence of mets - Has a pending PET scan  - Pt has not been found to be a candidate for TKI 's , despite their role in improving progression-free survival compared to placebo but has not shown to improve overall survival.     3. Postoperative hypothyroidism :   - Pt is clinically euthyroid  - She somehow decided to decrease  her Levothyroxine dose from 2 tabs on Sundays and 1 tablet the rest of the week TO  HALF a tablet daily based on family advise ? - I have reminded her that she had total thyroidectomy and may end up with myxedema coma if she is not on the right dose of LT- replacement.  - TSH is 55 uIU/Ml. Pt will return to original prescription of levothyroxine as below    Medication:  Levothyroxine 125 mcg, two tabs on Sundays and 1 tab daily rest of the week    4. Chronic diarrhea  :   - Improving stool texture  - Well controlled with Imodium and cholestyramine   Medications Cholestyramine 4 grams BID Loperamide 2 grams TID    5. Weight loss :   - Pending PEG tube this week     F/U in 6 months  Labs in 8 weeks    Signed electronically by: Mack Guise, MD  [facility]  [address] [place], [place] [address] Phone: [phone] FAX: [phone]      CC: Amie Critchley [address] [place] [address] Phone: [phone]  Fax: [phone]   Return to Endocrinology clinic as below: Future Appointments  Date Time Provider [place]  01/13/2021 11:00 AM [facility] ROOM [facility] None  01/13/2021 12:30 PM [facility] 1 [facility] [place]  01/28/2021 11:00 AM [facility] PET CT 1 [facility] [place]  02/03/2021  1:30 PM [facility] LAB [facility] None  02/03/2021  2:00 PM Brunetta Genera, MD [facility] None

## 2021-01-11 NOTE — Patient Instructions (Signed)
-   Please stop by the lab today  

## 2021-01-12 ENCOUNTER — Other Ambulatory Visit: Payer: Self-pay | Admitting: Student

## 2021-01-12 LAB — TSH: TSH: 55.27 u[IU]/mL — ABNORMAL HIGH (ref 0.35–4.50)

## 2021-01-13 ENCOUNTER — Encounter: Payer: Self-pay | Admitting: Nutrition

## 2021-01-13 ENCOUNTER — Ambulatory Visit (HOSPITAL_COMMUNITY)
Admission: RE | Admit: 2021-01-13 | Discharge: 2021-01-13 | Disposition: A | Discharge status: Home / Self Care | Payer: Medicare Other | Source: Ambulatory Visit | Attending: Gastroenterology | Admitting: Gastroenterology

## 2021-01-13 ENCOUNTER — Encounter (HOSPITAL_COMMUNITY): Payer: Self-pay

## 2021-01-13 ENCOUNTER — Other Ambulatory Visit: Payer: Self-pay

## 2021-01-13 ENCOUNTER — Other Ambulatory Visit (HOSPITAL_COMMUNITY): Payer: Self-pay | Admitting: Student

## 2021-01-13 DIAGNOSIS — Z87891 Personal history of nicotine dependence: Secondary | ICD-10-CM | POA: Diagnosis not present

## 2021-01-13 DIAGNOSIS — Z85850 Personal history of malignant neoplasm of thyroid: Secondary | ICD-10-CM | POA: Diagnosis not present

## 2021-01-13 DIAGNOSIS — R1310 Dysphagia, unspecified: Secondary | ICD-10-CM | POA: Diagnosis not present

## 2021-01-13 DIAGNOSIS — Z801 Family history of malignant neoplasm of trachea, bronchus and lung: Secondary | ICD-10-CM | POA: Insufficient documentation

## 2021-01-13 DIAGNOSIS — R627 Adult failure to thrive: Secondary | ICD-10-CM | POA: Insufficient documentation

## 2021-01-13 DIAGNOSIS — C801 Malignant (primary) neoplasm, unspecified: Secondary | ICD-10-CM

## 2021-01-13 DIAGNOSIS — Z79899 Other long term (current) drug therapy: Secondary | ICD-10-CM | POA: Insufficient documentation

## 2021-01-13 DIAGNOSIS — Z803 Family history of malignant neoplasm of breast: Secondary | ICD-10-CM | POA: Diagnosis not present

## 2021-01-13 DIAGNOSIS — R1319 Other dysphagia: Secondary | ICD-10-CM

## 2021-01-13 DIAGNOSIS — Z79890 Hormone replacement therapy (postmenopausal): Secondary | ICD-10-CM | POA: Insufficient documentation

## 2021-01-13 DIAGNOSIS — R634 Abnormal weight loss: Secondary | ICD-10-CM

## 2021-01-13 HISTORY — PX: IR GASTROSTOMY TUBE MOD SED: IMG625

## 2021-01-13 LAB — CBC
HCT: 43.3 % (ref 36.0–46.0)
Hemoglobin: 14 g/dL (ref 12.0–15.0)
MCH: 29 pg (ref 26.0–34.0)
MCHC: 32.3 g/dL (ref 30.0–36.0)
MCV: 89.8 fL (ref 80.0–100.0)
Platelets: 226 10*3/uL (ref 150–400)
RBC: 4.82 MIL/uL (ref 3.87–5.11)
RDW: 13.2 % (ref 11.5–15.5)
WBC: 7.5 10*3/uL (ref 4.0–10.5)
nRBC: 0 % (ref 0.0–0.2)

## 2021-01-13 LAB — PROTIME-INR
INR: 1 (ref 0.8–1.2)
Prothrombin Time: 12.8 seconds (ref 11.4–15.2)

## 2021-01-13 MED ORDER — HYDROCODONE-ACETAMINOPHEN 5-325 MG PO TABS: 1.0000 | ORAL_TABLET | ORAL | Status: DC | PRN

## 2021-01-13 MED ORDER — LIDOCAINE HCL 1 % IJ SOLN
INTRAMUSCULAR | Status: AC
  Filled 2021-01-13: qty 20

## 2021-01-13 MED ORDER — MIDAZOLAM HCL 2 MG/2ML IJ SOLN
INTRAMUSCULAR | Status: AC
  Filled 2021-01-13: qty 4

## 2021-01-13 MED ORDER — FENTANYL CITRATE (PF) 100 MCG/2ML IJ SOLN
INTRAMUSCULAR | Status: AC
  Filled 2021-01-13: qty 2

## 2021-01-13 MED ORDER — GLUCAGON HCL RDNA (DIAGNOSTIC) 1 MG IJ SOLR
INTRAMUSCULAR | Status: AC
  Filled 2021-01-13: qty 1

## 2021-01-13 MED ORDER — LIDOCAINE-EPINEPHRINE 1 %-1:100000 IJ SOLN
INTRAMUSCULAR | Status: AC | PRN
  Administered 2021-01-13: 10 mL

## 2021-01-13 MED ORDER — IOHEXOL 300 MG/ML  SOLN
50.0000 mL | Freq: Once | INTRAMUSCULAR | Status: AC | PRN
  Administered 2021-01-13: 15 mL

## 2021-01-13 MED ORDER — OXYCODONE-ACETAMINOPHEN 5-325 MG PO TABS: 1.0000 | ORAL_TABLET | ORAL | 0 refills | Status: DC | PRN

## 2021-01-13 MED ORDER — CEFAZOLIN SODIUM-DEXTROSE 2-4 GM/100ML-% IV SOLN: 2.0000 g | INTRAVENOUS | Status: AC

## 2021-01-13 MED ORDER — GLUCAGON HCL (RDNA) 1 MG IJ SOLR
INTRAMUSCULAR | Status: AC | PRN
  Administered 2021-01-13: 1 mg via INTRAVENOUS

## 2021-01-13 MED ORDER — FENTANYL CITRATE (PF) 100 MCG/2ML IJ SOLN
INTRAMUSCULAR | Status: AC | PRN
  Administered 2021-01-13: 25 ug via INTRAVENOUS
  Administered 2021-01-13: 50 ug via INTRAVENOUS
  Administered 2021-01-13: 25 ug via INTRAVENOUS

## 2021-01-13 MED ORDER — MIDAZOLAM HCL 2 MG/2ML IJ SOLN
INTRAMUSCULAR | Status: AC | PRN
  Administered 2021-01-13: 0.5 mg via INTRAVENOUS
  Administered 2021-01-13: 1 mg via INTRAVENOUS
  Administered 2021-01-13: 0.5 mg via INTRAVENOUS

## 2021-01-13 MED ORDER — SODIUM CHLORIDE 0.9 % IV SOLN: INTRAVENOUS | Status: DC

## 2021-01-13 MED ORDER — LIDOCAINE VISCOUS HCL 2 % MT SOLN
OROMUCOSAL | Status: AC
  Filled 2021-01-13: qty 15

## 2021-01-13 MED ORDER — CEFAZOLIN SODIUM-DEXTROSE 2-4 GM/100ML-% IV SOLN
INTRAVENOUS | Status: AC
  Administered 2021-01-13: 2 g via INTRAVENOUS
  Filled 2021-01-13: qty 100

## 2021-01-13 NOTE — Procedures (Signed)
Pre procedure Dx: Dysphagia Post Procedure Dx: Same  Successful fluoroscopic guided insertion of gastrostomy tube.   The gastrostomy tube may be used immediately for medications.   Tube feeds may be initiated in 24 hours as per the primary team.    EBL: Minimal  Complications: None immediate  Jay Louvenia Golomb, MD Pager #: [phone]    

## 2021-01-13 NOTE — Discharge Instructions (Signed)
Please call Interventional Radiology clinic [phone] with any questions or concerns.  Follow up with Shannon [name], [place] [phone] or [place]on Brass, [place] with Dr. Therisa Doyne [phone] for g-tube care   Clear Liquid Diet, Adult A clear liquid diet is a diet that includes only liquids and semi-liquids that you can see through. No solid food is eaten on this diet. Most people need to follow this diet for only a short time. You may need to follow a clear liquid diet if:  You have a problem right before or after you have surgery.  You did not eat food for a long time.  You had any of these: ? Vomiting or feeling like you may vomit (nausea). ? Watery poop (diarrhea).  You are going to have an exam to look at parts of your digestive system.  You are going to have bowel surgery. What are the goals of this diet?  To rest the stomach.  To help you clear the digestive system before an exam.  To make sure that there is enough fluid in your body.  To make sure you get some energy.  To help you get back to eating like you used to. What are tips for following this plan?  A clear liquid is a liquid or semi-liquid that you can see through when you hold it up to a light. An example of a semi-liquid is gelatin.  This diet does not give you all the nutrients that you need. Choose a variety of the liquids that your doctor says you can have on this diet. That way, you will get as many nutrients as possible.  If you are not sure whether you can have certain items, ask your doctor.  If you cannot swallow a thin liquid, you will need to thicken it before taking it. This will stop you from breathing it in (aspirating). What foods should I eat?  Water and flavored water.  Fruit juices that do not have pulp. This includes cranberry juice, apple juice, or grape juice.  Tea and coffee without milk or cream.  Clear bouillon or broth.  Broth-based soups that have been strained.  Flavored  gelatin.  Honey.  Sugar water.  Ice or frozen ice pops that do not have any milk, yogurt, fruit pieces, or fruit pulp in them.  Clear sodas.  Clear sports drinks. The items listed above may not be a complete list of what you can eat and drink. Contact a dietitian for more options.   What foods should I avoid?  Juices that have pulp.  Milk.  Cream or cream-based soups.  Yogurt.  Solid foods that are not clear liquids or semi-liquids. The items listed above may not be a complete list of what you should not eat and drink. Contact a dietitian for more information. Questions to ask your doctor:  How long do I need to follow this diet?  Are there any medicines that I should change while on this diet? Summary  A clear liquid diet is a diet that includes only liquids and semi-liquids that you can see through.  Some goals of this diet are to rest your stomach and make sure you get enough fluid.  Avoid liquids with milk, cream, or pulp while you are on this diet. This information is not intended to replace advice given to you by your health care provider. Make sure you discuss any questions you have with your health care provider. Document Revised: 08/04/2020 Document Reviewed: 08/04/2020 Elsevier Patient Education  [employer].   How to Care for a Feeding Tube A feeding tube is a tube used to give medicine, water, and liquid food. A person may have this tube if she or he has trouble swallowing or cannot take food or medicine by mouth. Supplies needed to care for the tube site:  Clean gloves.  Clean washcloth, gauze pads, or soft paper towel.  Cotton swabs.  A skin barrier ointment or cream, such as petroleum jelly.  Soap and water.  Pre-cut foam pads or gauze (for around the tube).  Tube tape.  An anchoring device (optional). How to care for the tube site 1. Have all supplies ready and close to you. 2. Wash your hands with soap and water for at least 20  seconds. 3. Put on the gloves. 4. Change any pad or gauze near the tube if: ? It is dirty. ? It is wet. ? It has been there for more than one day. 5. Check the skin around the tube. Call the doctor if you see any of these: ? Red skin. ? A rash. ? Swelling. ? Leaking fluid. ? Extra skin. 6. Dip the gauze and cotton swabs in water and soap. 7. Use the cotton swabs to wipe the skin that is closest to the tube. 8. Use the gauze pads to wipe the rest of the skin near the tube. 9. Rinse with water. 10. Dry the area with a clean washcloth, dry gauze pad, or soft paper towel. 11. If the skin is red, use a cotton swab to put on a skin barrier cream or ointment. Put it on by making little circles. Do not apply antibiotic ointments at the tube site. 12. Put a new pre-cut foam pad or gauze around the tube. If there is no fluid leaking at the tube site, you do not need a pad or gauze. 13. Tape down the edges. 14. Use tape or an anchoring device to attach the tube to the skin. Do this for comfort or as told. Each time you use tape, put it in a different place. 15. Help the person sit halfway up (about a 30- to 45-degree angle). 16. Throw away used supplies. 17. Take off your gloves. 18. Wash your hands with soap and water for at least 20 seconds.      Supplies needed to flush a feeding tube:  Clean gloves.  A clean 60 mL syringe that connects to the feeding tube.  A towel.  Germ-free (sterile) or purified water. Follow these rules: ? Use germ-free water if:  Your body's defense system (immune system) is weak and you have trouble getting better from infections (are immunocompromised).  You do not know how many chemicals are in your water. ? Do not use water from lakes or other bodies of water unless you treat it or filter it first. ? To make drinking water pure by boiling:  Boil water for 1 minute or longer. Keep a lid over the water while it boils.  Let the water cool to room  temperature before you use it. How to flush a feeding tube 1. Have all supplies ready and close to you. 2. Wash your hands with soap and water for at least 20 seconds. 3. Put on gloves. 4. Pull 30 mL of water into the syringe. 5. Before you push water into the tube (flush the tube), put the towel under the tube to catch any fluid leaks. 6. Bend or kink the feeding tube while you do  one of these things: ? Disconnect it from the feeding-bag tubing. ? Take off the cap at the end of the tube. 7. Put the tip of the syringe into the end of the feeding tube. 8. Stop bending the tube. 9. Use the syringe to slowly put the water in the tube. If the water will not go in the tube: ? Have the person lie on his or her left side. ? Try putting the water in the tube again. ? Do not push hard to make the water go in. 10. Take out the syringe and put the cap on the tube. 11. Throw away used supplies. 12. Take off your gloves. 13. Wash your hands with soap and water for at least 20 seconds.      General tips and recommendations Caring for the tube  If the person has a foam pad or gauze near the tube, change it: ? Every day. ? When it is dirty. ? When it is wet.  Do not put antibiotic ointments by the tube. Flushing the tube  Do not use a syringe that is smaller than 60 mL.  Flush the tube at all of these times: ? Before you give medicine. ? Between medicines. ? After the person gets the last medicine before a feeding.  Do not mix medicines with formula. Do not mix medicines with other medicines.  Completely flush medicines through the tube. That way, they will not mix with formula. Contact a doctor if:  The tube gets blocked or clogged.  You find any of these on the skin around the tube site: ? Red skin. ? A rash. ? Swelling. ? Leaking fluid. ? Extra skin. Summary  A feeding tube is a tube used to give medicine, water, and liquid food. A person may have this tube if she or he has  trouble swallowing or cannot take food or medicine by mouth.  Follow the doctor's instructions to care for the tube site and flush the tube every day.  Contact a doctor if the tube gets blocked or clogged. This information is not intended to replace advice given to you by your health care provider. Make sure you discuss any questions you have with your health care provider. Document Revised: 02/13/2020 Document Reviewed: 02/13/2020 Elsevier Patient Education  2021 [employer].   Moderate Conscious Sedation, Adult, Care After This sheet gives you information about how to care for yourself after your procedure. Your health care provider may also give you more specific instructions. If you have problems or questions, contact your health care provider. What can I expect after the procedure? After the procedure, it is common to have:  Sleepiness for several hours.  Impaired judgment for several hours.  Difficulty with balance.  Vomiting if you eat too soon. Follow these instructions at home: For the time period you were told by your health care provider:  Rest.  Do not participate in activities where you could fall or become injured.  Do not drive or use machinery.  Do not drink alcohol.  Do not take sleeping pills or medicines that cause drowsiness.  Do not make important decisions or sign legal documents.  Do not take care of children on your own.      Eating and drinking  Follow the diet recommended by your health care provider.  Drink enough fluid to keep your urine pale yellow.  If you vomit: ? Drink water, juice, or soup when you can drink without vomiting. ? Make sure you  have little or no nausea before eating solid foods.   General instructions  Take over-the-counter and prescription medicines only as told by your health care provider.  Have a responsible adult stay with you for the time you are told. It is important to have someone help care for you until  you are awake and alert.  Do not smoke.  Keep all follow-up visits as told by your health care provider. This is important. Contact a health care provider if:  You are still sleepy or having trouble with balance after 24 hours.  You feel light-headed.  You keep feeling nauseous or you keep vomiting.  You develop a rash.  You have a fever.  You have redness or swelling around the IV site. Get help right away if:  You have trouble breathing.  You have new-onset confusion at home. Summary  After the procedure, it is common to feel sleepy, have impaired judgment, or feel nauseous if you eat too soon.  Rest after you get home. Know the things you should not do after the procedure.  Follow the diet recommended by your health care provider and drink enough fluid to keep your urine pale yellow.  Get help right away if you have trouble breathing or new-onset confusion at home. This information is not intended to replace advice given to you by your health care provider. Make sure you discuss any questions you have with your health care provider. Document Revised: 02/14/2020 Document Reviewed: 09/12/2019 Elsevier Patient Education  2021 [employer].

## 2021-01-13 NOTE — Progress Notes (Signed)
I received a request from [facility], Dr. Therisa Doyne, for assistance with tube feeding management.  Patient is scheduled for G-tube placement today.  Patient followed by Dr. 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  She is an 80 year old female whose past medical history also includes breast cancer, osteoporosis, anxiety, and tobacco.  Medications include Prilosec, Gas-X, multivitamin, Synthroid, Imodium, Carafate, biotin, vitamin B 6, vitamin E.  Labs include potassium 4.5, glucose 97, BUN 17, creatinine 1.72 on January 13.  Height: 5 feet 3 inches. Weight: 85.3 pounds on January 11, 2021. Usual body weight: Approximately 100 pounds.  Patient reports usual body weight of 110-115 pounds "before this all started." BMI: 15.12.    Estimated nutrition needs: 1250-1450 cal, 45-55 gm protein, 1.4 L fluid.  I spoke with patient in interventional radiology to obtain nutrition history.  24 dietary recall reveals patient consumes approximately 900 cal daily.  She reports she can drink 1 boost plus a day (kcal included in estimate).  She denies difficulty swallowing at this time but reports she has had a history of difficulty swallowing.  Brief nutrition physical focused exam reveals severe muscle and fat loss.  Patient states it is just difficult for her to eat enough food and she and her family are concerned with weight loss.  Patient is at risk for refeeding secondary to low BMI, cancer diagnosis, and weight loss.  Nutrition diagnosis: Severe malnutrition in the context of chronic illness as evidenced by less than or equal to 75% energy intake for greater than 1 month and severe depletion of body fat and muscle mass on physical exam.  Intervention: Brief education was provided to patient awaiting G-tube placement.  She gave permission for me to educate her daughter-in-law who is in the waiting area today. Recommend patient begin with one half carton of Osmolite 1.5 twice daily with 60 mL free  water before and after bolus feedings.  Increase as tolerated by one half carton every 2 days to goal rate of 2 cartons daily.  This will provide approximately 710 cal, 29.8 gm protein and 602 mL free water. Recommend ordering BMP, phosphorus, and magnesium today for baseline and twice weekly as tube feeding is advancing to monitor for refeeding.  MD will replete as needed. Add 100 mg thiamine daily. If patient unable to eat by mouth, recommend increasing Osmolite 1.5 to goal rate of 4 cartons daily to provide 1420 cal, 59.6 gm protein.  Will need to monitor for tolerance.  MD to monitor weight. I have notified [facility], Philis Nettle, to provide tube feeding and education at patient's home.  Documents were provided in secure e-mail. Written education fact sheets were provided to patient's daughter.  Questions were answered.  Teach back method used.  Communication provided to Dr. Therisa Doyne and Dr. Irene Limbo.  Monitoring, evaluation, goals: Patient should be monitored for refeeding syndrome and tube feeding advancement as needed to promote weight gain.  **Disclaimer: This note was dictated with voice recognition software. Similar sounding words can inadvertently be transcribed and this note may contain transcription errors which may not have been corrected upon publication of note.**

## 2021-01-13 NOTE — H&P (Signed)
Chief Complaint: Patient was seen in consultation today for failure to thrive  Referring Physician(s): Ronnette Juniper  Supervising Physician: Corrie Mckusick  Patient Status: [facility] - Out-pt  History of Present Illness: Shannon [name] is a 80 y.o. female with past medical history of medullary thyroid cancer s/p thyroidectomy who has been followed by GI for dysphagia and weight loss.  Patient has experienced progressive weight loss over the past 1 year, recently stabilized at 80 lbs with a suboptimal BMI.  She is referred to IR for percutaneous gastrostomy tube placement at the request of Dr. Therisa Doyne.   [name] presents to [facility] today.  She has not yet had home health arranged and has not had prior education on tube maintenance.  She was to meet with RD earlier this year, but was unable to schedule her appointment.  She reports ongoing dysphagia improved with taking small bites, chewing longer, and smaller meals.  She has been unable to improve her weight status. She understands the goals of the procedure today and is agreeable to proceed.  She has been NPO today.   Past Medical History:  Diagnosis Date  . Anxiety   . Breast cancer ([place]) 1999   left lumpectomy/radiation/ductal carcinoma in situ  . Cancer ([place])   . Colon polyp   . Contact lens/glasses fitting    HAS LENS IMPLANTS  . Lung cancer ([place]) 2015   LYMPHNODE REMOVAL   . Medullary carcinoma of thyroid ([place]) 2011  . Osteoporosis     Past Surgical History:  Procedure Laterality Date  . ABDOMINAL HYSTERECTOMY    . ABDOMINAL SURGERY  1989   RUPTURED APPENDIX  . BREAST SURGERY  1999   LEFT BREAST LUMPECTOMY. DUCTAL CARCINOMA IN SITU./ RADIATION  . CATARACT EXTRACTION W/ INTRAOCULAR LENS IMPLANT  2009   BOTH EYES  . FEMUR IM NAIL Left 10/23/2017   Procedure: INTRAMEDULLARY (IM) NAIL FEMORAL;  Surgeon: Paralee Cancel, MD;  Location: [facility];  Service: Orthopedics;  Laterality: Left;  . FOOT SURGERY  1070   RIGHT FOOT -  PINCHED NERVE  . LUNG SURGERY     CALCIFICATIONS, POSITIVE - CANCER LYMPHNODE   . MELANOMA EXCISION  2008   CHEST AREA  . NECK SURGERY  Nov 09 2011   LYMPHNODES REMOVED 2 POSITIVE   . RIGHT COLECTOMY  06/02/2009  . THYROIDECTOMY  11/06/2009  . TONSILLECTOMY AND ADENOIDECTOMY      Allergies: Codeine  Medications: Prior to Admission medications   Medication Sig Start Date End Date Taking? Authorizing Provider  ALPRAZolam Duanne Moron) 1 MG tablet Take 0.5 tablets (0.5 mg total) by mouth 2 (two) times daily. 10/27/17  Yes Nita Sells, MD  bacitracin ophthalmic ointment  08/15/20  Yes [provider]  Calcium Carbonate-Vit D-Min (CALCIUM 600 + MINERALS PO) Take 1 tablet by mouth daily.   Yes [provider]  cetirizine (ZYRTEC) 10 MG tablet Take 10 mg by mouth daily.   Yes [provider]  Cholecalciferol (VITAMIN D) 1000 UNITS capsule Take 1,000 Units by mouth daily.   Yes [provider]  cholestyramine (QUESTRAN) 4 g packet Take 1 packet (4 g total) by mouth 2 (two) times daily. Patient taking differently: Take 4 g by mouth daily. 09/02/19  Yes Shamleffer, Melanie Crazier, MD  [facility] Liver Oil 1000 MG CAPS Take 1,000 mg by mouth daily.   Yes [provider]  diphenhydramine-acetaminophen (TYLENOL PM EXTRA STRENGTH) 25-500 MG TABS tablet 1-2 tabs   Yes [provider]  Glucosamine-Chondroitin 250-200 MG CAPS Take 1 tablet by mouth daily. Name of table is SCHIFF 12/23/10  Yes [provider]  levothyroxine (SYNTHROID) 125 MCG tablet Take 1 tablet (125 mcg total) by mouth as directed. 2 tablets on sundays and 1 tablet rest of the week Patient taking differently: Take 125 mcg by mouth as directed. Taking 1/2 tablet by mouth daily 07/14/20  Yes Shamleffer, Melanie Crazier, MD  loperamide (IMODIUM A-D) 2 MG tablet Take 2 mg by mouth 3 (three) times daily.   Yes [provider]  Multiple Vitamin (MULTIVITAMIN) tablet Take 1  tablet by mouth daily.   Yes [provider]  simethicone (MYLICON) 80 MG chewable tablet Chew 80 mg by mouth 3 (three) times daily.    Yes [provider]  sucralfate (CARAFATE) 1 GM/10ML suspension Take 1 g by mouth 2 (two) times daily. 11/05/20  Yes [provider]  vitamin E 180 MG (400 UNITS) capsule 1 tablet   Yes [provider]  Biotin 1000 MCG tablet 1-2 tablets    [provider]  Calcium Carb-Cholecalciferol 600-200 MG-UNIT TABS 2 tablets    [provider]  pantoprazole (PROTONIX) 40 MG tablet Take 40 mg by mouth daily.    [provider]  zoledronic acid (RECLAST) 5 MG/100ML SOLN injection Inject 5 mg into the vein once.    [provider]     Family History  Problem Relation Age of Onset  . Cancer Father        throat/ bone  . Breast cancer Sister   . Cancer Sister        MEDULAR CANCER  . Cancer Brother        prostate  . Cancer Other        MEDULAR CANCER  . Breast cancer Maternal Aunt   . Cancer Paternal Aunt        pancreatic   . Cancer Cousin        ON MOTHER'S SIDE- LIVER CANCER    Social History   Socioeconomic History  . Marital status: Married    Spouse name: Not on file  . Number of children: Not on file  . Years of education: Not on file  . Highest education level: Not on file  Occupational History  . Not on file  Tobacco Use  . Smoking status: Former Smoker    Quit date: 08/10/1960    Years since quitting: 60.4  . Smokeless tobacco: Never Used  Vaping Use  . Vaping Use: Never used  Substance and Sexual Activity  . Alcohol use: No    Alcohol/week: 0.0 standard drinks  . Drug use: No  . Sexual activity: Never  Other Topics Concern  . Not on file  Social History Narrative  . Not on file   Social Determinants of Health   Financial Resource Strain: Not on file  Food Insecurity: Not on file  Transportation Needs: Not on file  Physical Activity: Not on file  Stress:  Not on file  Social Connections: Not on file     Review of Systems: A 12 point ROS discussed and pertinent positives are indicated in the HPI above.  All other systems are negative.  Review of Systems  Constitutional: Negative for fatigue and fever.  HENT: Positive for trouble swallowing.   Respiratory: Negative for cough and shortness of breath.   Cardiovascular: Negative for chest pain.  Gastrointestinal: Negative for abdominal pain, diarrhea, nausea and vomiting.  Genitourinary: Negative for dysuria.  Musculoskeletal: Negative for back pain.  Psychiatric/Behavioral: Negative for behavioral problems and confusion.    Vital Signs: BP 133/71   Pulse 97   Temp 98.1 F (36.7 C) (Oral)   Resp 20   Ht 5\' 3"  (1.6 m)   Wt 81 lb (36.7 kg)   SpO2 97%   BMI 14.35 kg/m   Physical Exam Vitals and nursing note reviewed.  Constitutional:      General: She is not in acute distress.    Appearance: Normal appearance. She is not ill-appearing.  HENT:     Mouth/Throat:     Mouth: Mucous membranes are moist.     Pharynx: Oropharynx is clear.  Cardiovascular:     Rate and Rhythm: Normal rate and regular rhythm.  Pulmonary:     Effort: Pulmonary effort is normal. No respiratory distress.     Breath sounds: Normal breath sounds.  Abdominal:     General: Abdomen is flat.     Palpations: Abdomen is soft.  Musculoskeletal:     Comments: Evidence of severe subcutaneous fat and muscle wasting.    Skin:    General: Skin is warm and dry.  Neurological:     General: No focal deficit present.     Mental Status: She is alert and oriented to person, place, and time. Mental status is at baseline.  Psychiatric:        Mood and Affect: Mood normal.        Behavior: Behavior normal.        Thought Content: Thought content normal.        Judgment: Judgment normal.      MD Evaluation Airway: WNL Heart: WNL Abdomen: WNL Chest/ Lungs: WNL ASA  Classification: 3 Mallampati/Airway Score:  One   Imaging: No results found.  Labs:  CBC: Recent Labs    06/22/20 1042 11/12/20 1045 01/13/21 1108  WBC 14.5* 5.3 7.5  HGB 14.5 13.5 14.0  HCT 43.8 40.4 43.3  PLT 201 209 226    COAGS: Recent Labs    01/13/21 1108  INR 1.0    BMP: Recent Labs    06/22/20 1042 11/12/20 1045  NA 139 142  K 4.2 4.5  CL 102 105  CO2 29 30  GLUCOSE 118* 97  BUN 12 17  CALCIUM 10.1 9.7  CREATININE 0.95 0.72  GFRNONAA 57* >60  GFRAA >60  --     LIVER FUNCTION TESTS: Recent Labs    06/22/20 1042 11/12/20 1045  BILITOT 1.6* 0.9  AST 22 22  ALT 17 20  ALKPHOS 90 81  PROT 7.3 6.8  ALBUMIN 4.2 4.0    TUMOR MARKERS: No results for input(s): AFPTM, CEA, CA199, CHROMGRNA in the last 8760 hours.  Assessment and Plan: Patient with past medical history of medullary thyroid cancer, dysphagia presents for percutaneous gastrostomy tube placement at the request of Dr. Therisa Doyne.  Patient does not have home health arranged, however Dr. Encarnacion Slates office is aware and is working towards this.  Spoke with Dory Peru, RD who is also aware of the patient.  She is agreeable to assist with nutrition-related recommendations and attempting to obtain tube feeding for the patient.   Patient is aware that these arrangements are not solidified and there may be a time at home that the tube is in place, but she is not using it. She is agreeable to proceed as arrangements are in process and she has family support.  Case reviewed by Dr. Earleen Newport who approves patient for  procedure.  Patient presents today in their usual state of health.  She has been NPO and is not currently on blood thinners.  She did drink barium last PM without issue.   Risks and benefits image guided gastrostomy tube placement was discussed with the patient including, but not limited to the need for a barium enema during the procedure, bleeding, infection, peritonitis and/or damage to adjacent structures.  All of the patient's questions  were answered, patient is agreeable to proceed.  Consent signed and in chart.    Thank you for this interesting consult.  I greatly enjoyed meeting TONGA PROUT and look forward to participating in their care.  A copy of this report was sent to the requesting provider on this date.  Electronically Signed: Docia Barrier, PA 01/13/2021, 12:22 PM   I spent a total of  30 Minutes   in face to face in clinical consultation, greater than 50% of which was counseling/coordinating care for dysphagia, failure to thrive.

## 2021-01-28 ENCOUNTER — Other Ambulatory Visit: Payer: Self-pay

## 2021-01-28 ENCOUNTER — Ambulatory Visit (HOSPITAL_COMMUNITY)
Admission: RE | Admit: 2021-01-28 | Discharge: 2021-01-28 | Disposition: A | Discharge status: Home / Self Care | Payer: Medicare Other | Source: Ambulatory Visit | Attending: Hematology | Admitting: Hematology

## 2021-01-28 DIAGNOSIS — C73 Malignant neoplasm of thyroid gland: Secondary | ICD-10-CM | POA: Insufficient documentation

## 2021-01-28 LAB — GLUCOSE, CAPILLARY: Glucose-Capillary: 105 mg/dL — ABNORMAL HIGH (ref 70–99)

## 2021-01-28 MED ORDER — FLUDEOXYGLUCOSE F - 18 (FDG) INJECTION
5.0000 | Freq: Once | INTRAVENOUS | Status: AC | PRN
  Administered 2021-01-28: 5 via INTRAVENOUS

## 2021-01-28 MED ORDER — FLUDEOXYGLUCOSE F - 18 (FDG) INJECTION: 5.0000 | Freq: Once | INTRAVENOUS | Status: DC | PRN

## 2021-02-02 NOTE — Progress Notes (Signed)
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 total thyroidectomy in January of 2011 and bilateral neck dissection in June of 2011.  B. Persistent hypercalcitoninemia postoperatively. Calcitonin 499 in 07/2011  C. December 2012: CT of neck/chest was negative for metastases D. She had a normal neck ultrasound in 01/2012 showing her cystic LN was smaller (the one biopsied and negative).  E. FDG PET 08/2012 due to her last calcitonin raising substantially to 739 from the 500s: concerning right cervical lymph node F. 11/08/2012: Level 6 LN dissection. She had 2/2 LN positive, largest 2cm for MTC. Calcitonin one week later from her surgery was down to 515.  G. Calcitonin 592 on 05/24/2013. Repeat ultrasound on that same day which showed 2 at least partially cystic lymph nodes at level 4 which had been previously biopsied and negative; decreased in size since 2012. It also showed a subcentimeter right level VB LN of low suspicion. H. Her metanephrines (plasma) and PTH were normal in 12/2013 Urine studies for pheo were negative in 12/2013. I. 08/04/2014: CEA 9.0; calcitonin 1019 J. 09/05/2014:  - C/A/P CT scan: Interval development of a new calcified nodule in the left lower lobe. - Bone scan: Slight asymmetric increased radiotracer uptake in the right lateral calvarium on the frontal radiograph - Neck U/S: Stable right lateral compartment level 4 and 5B lymph nodes. The cystic right level 4 lymph node, previously biopsied on 01/14/2011 with negative pathology results.  K. 09/23/2014: Thoracoscopy: level 9 LN: Metastatic medullary thyroid carcinoma.  L. 03/11/2015:  CEA 15.9; calcitonin 1149 M 04-13-2015:  - C/A/P CT scan: Previously reported left lower lobe pulmonary nodule is no longer identified. No new or enlarging pulmonary nodules. No evidence of metastatic disease in the abdomen or pelvis. - CT brain:No acute intracranial abnormalities.  New small CSF density subdural fluid collection overlying the right cerebral hemisphere, likely a small hygroma of uncertain etiology. There is no associated bony or parenchymal lesion. 04-13-2015: Bone scan: A non-specific, very small focus of increased tracer activity in the right frontal calvarium is not significantly changed from the prior study. - Neck U/S: There is hypoechoic lesion with questionable microcalcification located in right level IV, it is 0.4 x 0.6 x 0.9 cm. No other pathologic cervical lymphadenopathy seen.  N. 04/13/2015: CEA 14.6; calcitonin 1324 O. 07/16/2015 ([facility]): CEA 19.4 (outsode of [facility]); CAlcitonin 1860. P. 09-07-2015: Neck, Chest , Abd, [place] CT: Stable post-thyroidectomy changes without evidence of recurrent or metastatic disease. Stable pulmonary nodules. Hepatic cysts. No bone mets. CEA: 16; Calcitonin 1533 Q. 03-08-2016: C/A/P CT scan: No definite evidence of metastatic disease in the chest, abdomen or pelvis. Compared to 09/07/2015, there is a new linear 3 mm right lower lobe pulmonary nodule which is indeterminate and may represent focal atelectasis CEA 16.5; CAlcitonin 1839  R. 08-22-2016: Calcitonin: 4596; CEA 27.1  S. 09-06-2016:  - Bone scan: Redemonstrated likely two foci of increased radiotracer uptake within the right calvarium, similar to prior study. - Neck CT: No evidence of recurrent or metastatic disease in the neck. - C/A/P CT: No definite evidence of metastatic disease in the chest, abdomen, or pelvis. - Neck CT: Status post thyroidectomy  and lymph node dissection without evidence of recurrent or metastatic disease.  T. Bone scan: A focus of increased tracer activity  projecting over the posterior right approximate 8th rib is non-specific; this finding may relate to summation of tracer activity in the rib with the inferior tip of the overlying right scapula, post-traumatic change or skeletal metastasis; recommend attention on follow-up. Re-demonstration of subtly increased tracer activity in the right frontal calvarium, stable since 09/05/2014. U. 02-13-2017:  - C/A/P CT scan: nonenlarged mediastinal lymph nodes and soft tissue nodule in the right thyroidectomy bed. Unchanged irregular opacities in the lung apices bilaterally, which may represent treated metastases or scarring.  - Neck CT: Numerous soft tissue enhancing lesions in the superior mediastinum. While these are stable when compared to most recent CT, several of these lesions demonstrate minimal growth from the more remote exam on 09/07/2015. No new enhancing lesions are present. - CEA 25.1, Calcitonin: 5498 V. 08-28-2017: fractionate plasma free metanephrines: Normetanephrine 178 (24hr urine pending as this was thought to be non-specific), Metanephrine: 44 (normal). CEA 46; Calcitonin 9763 W. 09-27-2017: Bone scan: Stable increased radiotracer activity in the right frontal calvarium, indeterminate. No other evidence of osseous metastasis Chest CT/Abd: Mildly enlarged mediastinal lymph nodes are stable. No evidence of metastatic disease in the abdomen   HISTORY OF PRESENTING ILLNESS:   Shannon [name] is a wonderful 80 y.o. female who has been referred to [place] by [place] oncologist Dr. Leslie Andrea for management of Metastatic Medullary Thyroid Cancer. She is looking for a Financial controller in [place].  Today she is accompanied by her sister. Patient notes this started with a nodule in her throat and her endocrinologist started further work up. She has been seeing Med onc at [facility] since her first surgery every 3-6 months.   She has had thyroid surgery and Ldissection but no systemic therapy as  detailed above in the oncologic history.  She notes her family had testing for MEN2A mutation. Her sister, her nephew carry the gene. She notes her maternal aunt had goiters but not sure of the cause.   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 She has had diarrhea since 2010. Her last colonoscopy was in 2010. She has tried multiple treatment options but this continues to worsen over time. She currently takes imodium 4-6 X daily, cholestyramine twice daily. She started monthly Sandostatin on 10/20/17. She fell and broke her hip on 10/23/17 and had surgery same day. She was given hydrocodone which helped her diarrhea and is not sure if the Sandostatin is working for her. She has been on oral iron and she is on aspirin 357m since her hip surgery. She does PT exercises and has been healing well.    She recently stopped caffeine tablets that she was taking for energy. She stopped 3 months ago. She is on Reclast for her osteoporosis, which is managed by her PCP, Dr. KDeatra Ina Pt notes she stopped smoking about 40 years ago and she rarely drinks alcohol.   On review of symptoms, pt notes continued diarrhea, stable energy, weight loss of 6-8 pounds in the last 6 months. Her appetite is adequate. Her diarrhea occurs currently twice a day with "pudding" form to liquid form stool. She can go up to 5-7 times a day at times. She denies cramping with diarrhea. She denies blood, and mucus in stool. Current use of oral iron causes some black stool, which was not present before use.   INTERVAL HISTORY:  Shannon [name] returns today regarding her Metastatic Medullary Thyroid Cancer. The patient's last visit with [place] was on 11/12/2020. The pt reports that she is doing well overall. We are joined today by her daughter-in-law.  The pt reports that her weight is gradually increasing since starting the feeding tube. The pt notes some continues intermittent diarrhea. The pt is currently  getting two cartons of supplement through the feeding tube. The pt has started eating more herself and been able to swallow that. The pt notes she is continuing to f/u w Ernestene Kiel regarding management of this. The pt notes she still constantly experiences acid reflux daily that is bothersome to her.  Of note since the patient's last visit, pt has had NM PET Skull Base to Thigh ([id number]) on 01/28/2021, which revealed "Nodal disease in the lower neck and chest, not changed since previous imaging with low to moderate FDG uptake as outlined above, uptake greater than background liver and mediastinal blood pool. Signs of bony metastatic disease with unchanged appearance from a radiographic standpoint also showing increased metabolic activity greater than liver and mediastinal blood pool as outlined above. Equivocal area in the RIGHT hepatic lobe, focal uptake without visible correlate on CT images. Could consider attention on follow-up or hepatic MRI as warranted for further evaluation. Mild uptake adjacent to LEFT TMJ, likely degenerative. Correlate with any symptoms in this area and attention on follow-up. Question mild pericolonic stranding. Correlate with any clinical concern for colitis particularly of the transverse colon. Findings are equivocal and there was some low level haziness in abdominal mesenteric structures on previous imaging perhaps related to paucity of mesenteric fat and nutritional status."  Lab results today 02/03/2021 of CBC w/diff and CMP is as follows: all values are WNL except for Calcium of 8.7. 02/03/2021 CEA in progress. 02/03/2021 Calcitonin in progress.  On review of systems, pt reports acid reflux and denies new bone pains, weight loss, infection issues, changing in breathing, dental issues, difficulty sleeping, back pain, leg swelling, and any other symptoms.  MEDICAL HISTORY:  Past Medical History:  Diagnosis Date  . Anxiety   . Breast cancer ([place]) 1999   left  lumpectomy/radiation/ductal carcinoma in situ  . Cancer ([place])   . Colon polyp   . Contact lens/glasses fitting    HAS LENS IMPLANTS  . Lung cancer ([employer]) 2015   LYMPHNODE REMOVAL   . Medullary carcinoma of thyroid ([place]) 2011  . Osteoporosis     SURGICAL HISTORY: Past Surgical History:  Procedure Laterality Date  . ABDOMINAL HYSTERECTOMY    . ABDOMINAL SURGERY  1989   RUPTURED APPENDIX  . BREAST SURGERY  1999   LEFT BREAST LUMPECTOMY. DUCTAL CARCINOMA IN SITU./ RADIATION  . CATARACT EXTRACTION W/ INTRAOCULAR LENS IMPLANT  2009   BOTH EYES  . FEMUR IM NAIL Left 10/23/2017   Procedure: INTRAMEDULLARY (IM) NAIL FEMORAL;  Surgeon: Paralee Cancel, MD;  Location: [facility];  Service: Orthopedics;  Laterality: Left;  . FOOT SURGERY  1070   RIGHT FOOT - PINCHED NERVE  . IR GASTROSTOMY TUBE MOD SED  01/13/2021  . LUNG SURGERY     CALCIFICATIONS, POSITIVE - CANCER LYMPHNODE   . MELANOMA EXCISION  2008   CHEST AREA  . NECK SURGERY  Nov 09 2011   LYMPHNODES REMOVED 2 POSITIVE   . RIGHT COLECTOMY  06/02/2009  . THYROIDECTOMY  11/06/2009  . TONSILLECTOMY AND ADENOIDECTOMY      SOCIAL HISTORY: Social History   Socioeconomic History  . Marital status:  Married    Spouse name: Not on file  . Number of children: Not on file  . Years of education: Not on file  . Highest education level: Not on file  Occupational History  . Not on file  Tobacco Use  . Smoking status: Former Smoker    Quit date: 08/10/1960    Years since quitting: 60.5  . Smokeless tobacco: Never Used  Vaping Use  . Vaping Use: Never used  Substance and Sexual Activity  . Alcohol use: No    Alcohol/week: 0.0 standard drinks  . Drug use: No  . Sexual activity: Never  Other Topics Concern  . Not on file  Social History Narrative  . Not on file   Social Determinants of Health   Financial Resource Strain: Not on file  Food Insecurity: Not on file  Transportation Needs: Not on file  Physical Activity: Not on file   Stress: Not on file  Social Connections: Not on file  Intimate Partner Violence: Not on file    FAMILY HISTORY: Family History  Problem Relation Age of Onset  . Cancer Father        throat/ bone  . Breast cancer Sister   . Cancer Sister        MEDULAR CANCER  . Cancer Brother        prostate  . Cancer Other        MEDULAR CANCER  . Breast cancer Maternal Aunt   . Cancer Paternal Aunt        pancreatic   . Cancer Cousin        ON MOTHER'S SIDE- LIVER CANCER    ALLERGIES:  is allergic to codeine.  MEDICATIONS:  Current Outpatient Medications  Medication Sig Dispense Refill  . ALPRAZolam (XANAX) 1 MG tablet Take 0.5 tablets (0.5 mg total) by mouth 2 (two) times daily. 6 tablet 0  . bacitracin ophthalmic ointment     . Biotin 1000 MCG tablet 1-2 tablets    . Calcium Carb-Cholecalciferol 600-200 MG-UNIT TABS 2 tablets    . Calcium Carbonate-Vit D-Min (CALCIUM 600 + MINERALS PO) Take 1 tablet by mouth daily.    . cetirizine (ZYRTEC) 10 MG tablet Take 10 mg by mouth daily.    . Cholecalciferol (VITAMIN D) 1000 UNITS capsule Take 1,000 Units by mouth daily.    . cholestyramine (QUESTRAN) 4 g packet Take 1 packet (4 g total) by mouth 2 (two) times daily. (Patient taking differently: Take 4 g by mouth daily.) 180 each 3  . Cod Liver Oil 1000 MG CAPS Take 1,000 mg by mouth daily.    . diphenhydramine-acetaminophen (TYLENOL PM EXTRA STRENGTH) 25-500 MG TABS tablet 1-2 tabs    . Glucosamine-Chondroitin 250-200 MG CAPS Take 1 tablet by mouth daily. Name of table is SCHIFF    . levothyroxine (SYNTHROID) 125 MCG tablet Take 1 tablet (125 mcg total) by mouth as directed. 2 tablets on sundays and 1 tablet rest of the week (Patient taking differently: Take 125 mcg by mouth as directed. Taking 1/2 tablet by mouth daily) 104 tablet 3  . loperamide (IMODIUM A-D) 2 MG tablet Take 2 mg by mouth 3 (three) times daily.    . Multiple Vitamin (MULTIVITAMIN) tablet Take 1 tablet by mouth daily.    Marland Kitchen  oxyCODONE-acetaminophen (PERCOCET/ROXICET) 5-325 MG tablet Take 1 tablet by mouth every 4 (four) hours as needed for up to 20 doses for severe pain. 20 tablet 0  . pantoprazole (PROTONIX) 40 MG tablet Take  40 mg by mouth daily.    . simethicone (MYLICON) 80 MG chewable tablet Chew 80 mg by mouth 3 (three) times daily.     . sucralfate (CARAFATE) 1 GM/10ML suspension Take 1 g by mouth 2 (two) times daily.    . vitamin E 180 MG (400 UNITS) capsule 1 tablet    . zoledronic acid (RECLAST) 5 MG/100ML SOLN injection Inject 5 mg into the vein once.     No current facility-administered medications for this visit.    REVIEW OF SYSTEMS:   10 Point review of Systems was done is negative except as noted above.  PHYSICAL EXAMINATION: ECOG PERFORMANCE STATUS: 1 - Symptomatic but completely ambulatory   Vitals:   02/03/21 1401  BP: (!) 127/56  Pulse: 96  Resp: 18  Temp: (!) 97 F (36.1 C)  TempSrc: Tympanic  SpO2: 98%  Weight: 87 lb 8 oz (39.7 kg)  Height: _0  (1.6 m)     Exam was given in a chair.  GENERAL:alert, in no acute distress and comfortable SKIN: no acute rashes, no significant lesions EYES: conjunctiva are pink and non-injected, sclera anicteric OROPHARYNX: MMM, no exudates, no oropharyngeal erythema or ulceration NECK: supple, no JVD LYMPH:  no palpable lymphadenopathy in the axillary or inguinal regions. Just barely palpable lymph node in cervical region. LUNGS: clear to auscultation b/l with normal respiratory effort HEART: regular rate & rhythm ABDOMEN:  normoactive bowel sounds , non tender, not distended. Extremity: no pedal edema PSYCH: alert & oriented x 3 with fluent speech NEURO: no focal motor/sensory deficits   LABORATORY DATA:  I have reviewed the data as listed  . CBC Latest Ref Rng & Units 02/03/2021 01/13/2021 11/12/2020  WBC 4.0 - 10.5 K/uL 5.5 7.5 5.3  Hemoglobin 12.0 - 15.0 g/dL 12.4 14.0 13.5  Hematocrit 36.0 - 46.0 % 38.0 43.3 40.4  Platelets 150 -  400 K/uL 255 226 209  hgb 13.5  . CMP Latest Ref Rng & Units 02/03/2021 11/12/2020 06/22/2020  Glucose 70 - 99 mg/dL 90 97 118(H)  BUN 8 - 23 mg/dL _1 Creatinine 0.44 - 1.00 mg/dL 0.71 0.72 0.95  Sodium 135 - 145 mmol/L 142 142 139  Potassium 3.5 - 5.1 mmol/L 4.5 4.5 4.2  Chloride 98 - 111 mmol/L 101 105 102  CO2 22 - 32 mmol/L _2 Calcium 8.9 - 10.3 mg/dL 8.7(L) 9.7 10.1  Total Protein 6.5 - 8.1 g/dL 7.0 6.8 7.3  Total Bilirubin 0.3 - 1.2 mg/dL 0.5 0.9 1.6(H)  Alkaline Phos 38 - 126 U/L 99 81 90  AST 15 - 41 U/L _3 ALT 0 - 44 U/L _4 02/12/18 Calcitonin:     PATHOLOGY  Lung Biopsy at [facility] 09/23/14 DIAGNOSIS A. Pleural nodule, biopsy:  Calcified and hyalinized remote fat necrosis. No evidence of malignancy.   B. Level 9 lymph node, biopsy:  Metastatic medullary thyroid carcinoma. See comment.  Comment:: A note is made of the patient's history of medullary thyroid carcinoma with documented prior metastatic disease to cervical lymph nodes ([id number]; 04/05/2010).  The prior metastases to cervical lymph nodes are reviewed in conjunction to the current case and the tumors are morphologically similar.   Confirmatory immunohistochemical stains are performed. Tumor cells stain positively for calcitonin and for an anticytokeratin cocktail. The morphologic and immunophenotypic findings support metastatic medullary thyroid carcinoma.   C. Level 8 lymph node, biopsy:  Profiles of lymph nodes, negative for malignancy.  D. Pleural nodule #2, biopsy:  Fibrovascular tissue with extensive cauterization. No evidence of malignancy.        Surgical Pathology at [facility] 11/08/12 Immunohistochemical Findings An immunohistochemical stain for calcitonin obtained on paraffin block A1 is positive.   Diagnosis A. "RIGHT LEVEL 6 LYMPH NODES" (BIOPSY):  METASTATIC MEDULLARY THYROID CARCINOMA IN TWO LYMPH NODES (2/2). SIZE OF LARGEST METASTASIS:2  CM. EXTRANODAL INVASION:PRESENT. Comment:  I certify that I personally conducted the diagnostic evaluation of the above  specimen(s) and have rendered the above diagnosis(es). Rex C. Telford Nab, M.D. [phone] Electronically signed: 11/16/12   A. "Right level 6 lymph nodes", received fresh and placed in formalin on 1/9/ two fragments of tan-red soft tissue.Fragment 1 is 1.3 x 1 x 0.6 cm and is bisected and submitted in block A1.Fragment 2 is 2 x 1 x 0.4 cm and is submitted entirely in block A2.    RADIOGRAPHIC STUDIES: I have personally reviewed the radiological images as listed and agreed with the findings in the report. IR GASTROSTOMY TUBE MOD SED  Result Date: 01/13/2021 INDICATION: Failure to thrive. Please perform percutaneous gastrostomy tube placement for enteric nutrition supplementation purposes. EXAM: PULL TROUGH GASTROSTOMY TUBE PLACEMENT COMPARISON:  CT the chest, abdomen and pelvis-06/22/2020 MEDICATIONS: Ancef 2 gm IV; Antibiotics were administered within 1 hour of the procedure. Glucagon 1 mg IV CONTRAST:  15 mL of Omnipaque 300 administered into the gastric lumen. ANESTHESIA/SEDATION: Moderate (conscious) sedation was employed during this procedure. A total of Versed 2 mg and Fentanyl 100 mcg was administered intravenously. Moderate Sedation Time: 11 minutes. The patient's level of consciousness and vital signs were monitored continuously by radiology nursing throughout the procedure under my direct supervision. FLUOROSCOPY TIME:  1 minute, 24 seconds (15 mGy) COMPLICATIONS: None immediate. PROCEDURE: Informed written consent was obtained from the patient following explanation of the procedure, risks, benefits and alternatives. A time out was performed prior to the initiation of the procedure. Ultrasound scanning was performed to demarcate the edge of the left lobe of the liver. Maximal barrier sterile  technique utilized including caps, mask, sterile gowns, sterile gloves, large sterile drape, hand hygiene and Betadine prep. The left upper quadrant was sterilely prepped and draped. An oral gastric catheter was inserted into the stomach under fluoroscopy. The existing nasogastric feeding tube was removed. The left costal margin and air opacified transverse colon were identified and avoided. Air was injected into the stomach for insufflation and visualization under fluoroscopy. Under sterile conditions a 17 gauge trocar needle was utilized to access the stomach percutaneously beneath the left subcostal margin after the overlying soft tissues were anesthetized with 1% Lidocaine with epinephrine. Needle position was confirmed within the stomach with aspiration of air and injection of small amount of contrast. A single T tack was deployed for gastropexy. Over an Amplatz guide wire, a 9-French sheath was inserted into the stomach. A snare device was utilized to capture the oral gastric catheter. The snare device was pulled retrograde from the stomach up the esophagus and out the oropharynx. The 20-French pull-through gastrostomy was connected to the snare device and pulled antegrade through the oropharynx down the esophagus into the stomach and then through the percutaneous tract external to the patient. The gastrostomy was assembled externally. Contrast injection confirms position in the stomach. Several spot radiographic images were obtained in various obliquities for documentation. The patient tolerated procedure well without immediate post procedural complication. FINDINGS: After successful fluoroscopic guided placement, the gastrostomy tube is appropriately positioned with internal disc against the  ventral aspect of the gastric lumen. IMPRESSION: Successful fluoroscopic insertion of a 20-French pull-through gastrostomy tube. The gastrostomy may be used immediately for medication administration and in 24 hrs for the  initiation of feeds. Electronically Signed   By: Sandi Mariscal M.D.   On: 01/13/2021 14:07   NM PET Image Restag (PS) Skull Base To Thigh  Result Date: 01/29/2021 CLINICAL DATA:  Initial treatment strategy for thyroid cancer with metastatic disease by previous CT. She. EXAM: NUCLEAR MEDICINE PET SKULL BASE TO THIGH TECHNIQUE: 5.0 mCi F-18 FDG was injected intravenously. Full-ring PET imaging was performed from the skull base to thigh after the radiotracer. CT data was obtained and used for attenuation correction and anatomic localization. Fasting blood glucose: 105 mg/dl COMPARISON:  Multiple prior studies most recent of September of 2021 and August of 2021. FINDINGS: Mediastinal blood pool activity: SUV max 1.44 Liver activity: SUV max NA NECK: Muscular activity seen about the neck prevertebral muscles and LEFT scalene in sternocleidomastoid. Small focus of activity near the LEFT TMJ slightly asymmetric. RIGHT low neck/supraclavicular activity, see below. Incidental CT findings: Postop thyroidectomy as on recent CT. CHEST: RIGHT thoracic inlet/low neck adenopathy with increased metabolism. Area previously measuring approximately 11-12 mm short axis (image 49, series 4) adjacent to RIGHT first and second ribs at the RIGHT thoracic inlet similar size with maximum SUV of 5.9 Just below this is another area of slightly less FDG uptake at the level of soft tissue adjacent to the T2 vertebral body at the thoracic inlet with a maximum SUV of 4.2 on image 52 of series 4, difficult to measure discrete soft tissue but grossly similar to previous neck CT. Adenopathy in the mediastinum. (Image 70, series 4) 1.7 cm short axis, within 1-2 mm of previous measurement with a maximum SUV of 4.0 RIGHT paratracheal lymph node (image 60, series 4) 11 mm similar size as compared to prior exams of August and February 2021. Low level activity in the 3 to for range for SUV in this node and in RIGHT peribronchial nodes and subcarinal lymph  nodes seen on today's study. Axillary lymph nodes on the RIGHT with similar appearance to prior imaging though arm position changes appearance slightly. Maximum SUV of group of nodes largest approximately 8 mm on image 54 of series 4 is 3.8. Calcified changes in the LEFT lower lobe and parenchymal distortion with calcification in the upper lobes show a similar appearance. These areas without significant FDG uptake Incidental CT findings: Calcified atheromatous plaque in the thoracic aorta. Normal heart size. Calcified coronary artery disease. Mildly patulous esophagus. Parenchymal findings in the upper lobes bilaterally and in the LEFT lower lobe as described. ABDOMEN/PELVIS: Tiny focus of uptake RIGHT hepatic lobe with a maximum SUV only slightly greater than surrounding liver is more focal but without CT correlate on image 116 of series 4 maximum SUV of 2.8 no additional areas of increased metabolism in the abdomen or in the pelvis to indicate metastatic involvement Incidental CT findings: Biliary duct dilation seen on previous imaging is similar. LEFT hepatic cysts. G-tube in place. Limited noncontrast low-dose CT without acute process in the upper abdomen. Colonic diverticulosis. Question of mild pericolonic stranding with collapsed colon in the area of the transverse colon, region not associated with significant increased uptake on FDG though is slightly gray other areas of the bowel. Mild mesenteric edema is a stable finding with paucity of mesenteric fat limiting assessment individual bowel loops. Spleen, adrenal glands and kidneys without acute process. Aortic atherosclerosis  without aneurysm. SKELETON: Signs of osseous metastatic disease. Sclerosis at T3 (image 53, series 4) similar to previous imaging with low level FDG uptake maximum SUV of 2.6. LEFT humeral lesion also quite similar radiographically though incompletely imaged on the prior study also displays low level FDG uptake with a maximum SUV of 3.5  Scattered areas of uptake in the sternum, posterior element T8 on image 53 of series 4 displaying a maximum SUV of 3.0 Similar partially sclerotic and partially lucent lesion at T11 on image 100 of series 4 with a maximum SUV of 4.1. L1 sclerotic focus on image 113 of series 4 with a maximum SUV of 2.6 RIGHT ischial mixed lytic and sclerotic focus with a maximum SUV of 3.8 on image 190 of series 4. The sclerotic appearance of multifocal metastatic disease is unchanged with numerous other lesions throughout the spine and pelvis. RIGHT anterolateral sixth rib with sclerosis and other subtle sclerotic areas with similar appearance. Incidental CT findings: LEFT femoral ORIF as before. IMPRESSION: Nodal disease in the lower neck and chest, not changed since previous imaging with low to moderate FDG uptake as outlined above, uptake greater than background liver and mediastinal blood pool. Signs of bony metastatic disease with unchanged appearance from a radiographic standpoint also showing increased metabolic activity greater than liver and mediastinal blood pool as outlined above. Equivocal area in the RIGHT hepatic lobe, focal uptake without visible correlate on CT images. Could consider attention on follow-up or hepatic MRI as warranted for further evaluation. Mild uptake adjacent to LEFT TMJ, likely degenerative. Correlate with any symptoms in this area and attention on follow-up. Question mild pericolonic stranding. Correlate with any clinical concern for colitis particularly of the transverse colon. Findings are equivocal and there was some low level haziness in abdominal mesenteric structures on previous imaging perhaps related to paucity of mesenteric fat and nutritional status. These results will be called to the ordering clinician or representative by the Radiologist Assistant, and communication documented in the PACS or [employer]. Electronically Signed   By: Zetta Bills M.D.   On: 01/29/2021 08:59      CT Chest w Contrast w 3D MIPS Protocol at [facility] 09/29/17 Impression: 1.Mildly enlarged mediastinal lymph nodes are stable when compared to most recent CT. Although, several nodes are minimally increased from 03/08/2016.  2.Refer to separately dictated abdominal CT report for findings below the diaphragm.  Bone Scan Whole Body at [facility] 09/27/17 Impression: 1. Stable foci of increased radiotracer activity in the right frontal calvarium, indeterminate. 2. No new scintigraphic evidence of osseous metastasis.  US Lymph Node Neck Mapping at [facility] 08/29/17 Impression: 1.  Newly measured right level 5 lymph node (lymph node #5) with abnormal rounded morphology, no echogenic hilum, and microcalcifications. Although biopsy of this nodule may be technically difficult due to its location deep to the carotid vasculature, ultrasound-guided biopsy is recommended (if possible).  2.  Newly measured right level 2A lymph node without suspicious features.  3.  Additional right neck nodes are unchanged.   Bone Density Scan 02/27/17 @ [facility]  Lowest Site Measured: Total Left hip with T-score of -3.1 and BMD of 0.568   CT CAP W Contrast W MIPS at [facility] 02/13/17 Impression: 1. Please see CT neck completed same day for findings in the neck. 2. Stable nonenlarged mediastinal lymph nodes and soft tissue nodule in the right thyroidectomy bed. 3. Unchanged irregular opacities in the lung apices bilaterally, which may represent treated metastases or scarring. No new metastatic disease  in the chest, abdomen, or pelvis.  CT Nect Soft Tissue W Contrast at [facility] 02/13/17 IMPRESSION: Numerous soft tissue enhancing lesions in the superior mediastinum. While these are stable when compared to most recent CT, several of these lesions demonstrate minimal growth from the more remote exam on 09/07/2015. No new enhancing lesions are present.  ASSESSMENT & PLAN:   JETTY BERLAND is a 80 y.o. caucasian female with    1. 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  She had a level 6 LN dissection on 11/08/12 with 2/2 LN positive, largest 2cm for MTC.  09/05/14 CAP CT with development of new calcified nodules in the left lower lobe 09/23/14 Lung biopsy showed Level 9 lymph node, positive for malignancy and metastatic cancer which was resected. Treatment for symptomatic disease with vandetanib and cabozantibnib were previously discussed with her 51 Oncologist but the patient had decided to hold off on treatment due to concerns for toxicities and lack of overt cancer related progressive symptoms. Latest scans from 08/2017 are stable, no disease found below chest or in the bones. Her calcitonin levels have been increasing since 2015. Overall pt currently asymptomatic.   CT chest on 10/17/2018 which revealed "1. Unchanged mediastinal and right supraclavicular lymphadenopathy. Findings are nonspecific, however metastatic disease is not excluded. 2. Stable biapical scarring and right apical consolidative focus with associated calcification, likely postinfectious in nature."  10/11/2018 CT angiography chest w contrast revealed "1. No demonstrable pulmonary embolus. No thoracic aortic aneurysm or dissection. There are foci of aortic atherosclerosis as well as great vessel and coronary artery calcifications. 2. Fibrosis and cicatrization in the apices with apical pleural calcification, likely due to scarring. There is focal airspace opacity which is asymmetric in the apical segment of the right upper lobe. This finding may well be due to chronic scarring. A potential focus of pneumonia or possible neoplastic involvement in this area must be of concern. This finding may well warrant correlation with nuclear medicine PET study to assess for abnormal metabolic  activity. At a minimum, a follow-up chest CT in 3 months to assess for stability of this area would be advised. No other airspace consolidation noted. Areas of patchy atelectasis noted. No well-defined nodular type lesions noted in the lung parenchyma. 3. Probable sclerotic bony metastases at C3 and L1. Sclerotic focus at L1 is incompletely visualized. Medullary thyroid carcinoma as well as breast carcinoma potentially may present with sclerotic as opposed to lytic metastases. 4.  No demonstrable thoracic adenopathy. 5. Extensive postoperative change in the thyroid region with evidence of total thyroidectomy. 6. Mild reflux into the inferior vena cava and hepatic veins suggests increase in right heart pressure. Aortic Atherosclerosis (ICD10-I70.0)."  12/06/2019 CT C/A/P ([id number]) ([id number]) revealed "1. Stable exam. No new or progressive findings to suggest progressive metastatic disease. 2. Scattered sclerotic bone lesions, similar to prior. 3. Stable mild intrahepatic and extrahepatic biliary duct dilatation. 4. Trace free fluid in the cul-de-sac."  12/06/2019 CT Soft Tissue Neck ([id number]) revealed "1. Thyroidectomy with nodal metastases at the thoracic inlet/anterior compartment and right supraclavicular fossa. There are no prior neck CT comparison is available for review. 2. Spinal osseous metastatic disease without visible extraosseous tumor extension."  06/22/2020 CT C/A/P ([id number]) ([id number]) revealed "1. Borderline enlarged supraclavicular and mediastinal lymph nodes, stable. 2. Osseous metastatic disease, stable. 3. Left lower lobe aspiration and trace left pleural fluid.  4. Hepatomegaly. 5. Aortic atherosclerosis."  2. 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 -Secondary to hypercalcitoninemia  -Currently on imodium daily, cholestyramine twice daily  -was previously on monthly Sandostatin injections in 10/20/17- but patient decided to hold citing no real  benefit from adding this -Her last colonoscopy was in 2010  PLAN:  -Discussed pt labwork today, 02/03/2021; blood counts all completely normal, CMP stable. Mildly low calcium levels. -Discussed pt NM PET Skull Base to Thigh ([id number]) on 01/28/2021; no major changes in this. Some fluctuation in the one nodule in neck but no overt suggestion of progression. Spots in chest similar to prior exam. Lesions in bone lighting up in borderline fashion. -Advised pt that acid reflux can cause loss of enamel as experiencing. -Recommended pt f/u w GI regarding acid reflux management and potential medications. -Advised pt that elevated calcitonin levels can be a driver for acid reflux. -Recommended pt start Zometa q47month instead of Reclast qyearly due to known bone tumors. The pt is still considering this. -No overt lab or clinical evidence of need to start treatment at this time. -Recommended pt take 2000 IU Vitamin D daily. -Recommended pt continue to f/u w BErnestene Kielfor active management of food intake and tube feeding needs. -Will see back in 6 months with labs.    3. Osteoporosis - Last DEXA 02/27/17 with Lowest Site Measured: Total Left hip with T-score of -3.1 and BMD of 0.568 Closed left hip fracture from fall -s/p surgery on 10/23/17 with rod placement - healing well.  -She takes Reclast injections for her osteoporosis. Managed by her PCP   4. Chronic oropharyngeal Dysphagia ? Related to previous neck surgery. Dry mouth etc. No acute changes  Plan -ENT referral for further evaluation of this since patient notes it is bothersome -discussed input regarding mx of dry mouth.   FOLLOW UP: F/u with BErnestene Kielfor tube feeding optimization RTC with Dr KIrene Limbowith labs in 6 months   The total time spent in the appt was 20 minutes and more than 50% was on counseling and direct patient cares.  All of the patient's questions were answered with apparent satisfaction. The patient knows to call  the clinic with any problems, questions or concerns.   GSullivan LoneMD MGreencastleAAHIVMS SEncompass Health Valley Of The Sun RehabilitationCIdaho State Hospital NorthHematology/Oncology Physician CChi St [facility] (Office):       [phone](Work cell):  [phone](Fax):           [phone] 02/03/2021 2:55 PM  I, RReinaldo Raddle am acting as scribe for Dr. GSullivan Lone MD.     .I have reviewed the above documentation for accuracy and completeness, and I agree with the above. .Brunetta GeneraMD

## 2021-02-03 ENCOUNTER — Other Ambulatory Visit: Payer: Self-pay

## 2021-02-03 ENCOUNTER — Telehealth: Payer: Self-pay | Admitting: Hematology

## 2021-02-03 ENCOUNTER — Inpatient Hospital Stay: Payer: Medicare Other | Attending: Hematology

## 2021-02-03 ENCOUNTER — Inpatient Hospital Stay (HOSPITAL_BASED_OUTPATIENT_CLINIC_OR_DEPARTMENT_OTHER): Payer: Medicare Other | Admitting: Hematology

## 2021-02-03 VITALS — BP 127/56 | HR 96 | Temp 97.0°F | Resp 18 | Ht 63.0 in | Wt 87.5 lb

## 2021-02-03 DIAGNOSIS — C73 Malignant neoplasm of thyroid gland: Secondary | ICD-10-CM | POA: Diagnosis not present

## 2021-02-03 DIAGNOSIS — E070 Hypersecretion of calcitonin: Secondary | ICD-10-CM | POA: Diagnosis not present

## 2021-02-03 DIAGNOSIS — Z85850 Personal history of malignant neoplasm of thyroid: Secondary | ICD-10-CM | POA: Diagnosis not present

## 2021-02-03 DIAGNOSIS — R970 Elevated carcinoembryonic antigen [CEA]: Secondary | ICD-10-CM | POA: Diagnosis not present

## 2021-02-03 DIAGNOSIS — M810 Age-related osteoporosis without current pathological fracture: Secondary | ICD-10-CM | POA: Diagnosis not present

## 2021-02-03 LAB — CMP (CANCER CENTER ONLY)
ALT: 19 U/L (ref 0–44)
AST: 25 U/L (ref 15–41)
Albumin: 4 g/dL (ref 3.5–5.0)
Alkaline Phosphatase: 99 U/L (ref 38–126)
Anion gap: 10 (ref 5–15)
BUN: 18 mg/dL (ref 8–23)
CO2: 31 mmol/L (ref 22–32)
Calcium: 8.7 mg/dL — ABNORMAL LOW (ref 8.9–10.3)
Chloride: 101 mmol/L (ref 98–111)
Creatinine: 0.71 mg/dL (ref 0.44–1.00)
GFR, Estimated: >60 mL/min (ref >60)
Glucose, Bld: 90 mg/dL (ref 70–99)
Potassium: 4.5 mmol/L (ref 3.5–5.1)
Sodium: 142 mmol/L (ref 135–145)
Total Bilirubin: 0.5 mg/dL (ref 0.3–1.2)
Total Protein: 7 g/dL (ref 6.5–8.1)

## 2021-02-03 LAB — CBC WITH DIFFERENTIAL/PLATELET
Abs Immature Granulocytes: 0.01 10*3/uL (ref 0.00–0.07)
Basophils Absolute: 0.1 10*3/uL (ref 0.0–0.1)
Basophils Relative: 1 %
Eosinophils Absolute: 0.1 10*3/uL (ref 0.0–0.5)
Eosinophils Relative: 2 %
HCT: 38 % (ref 36.0–46.0)
Hemoglobin: 12.4 g/dL (ref 12.0–15.0)
Immature Granulocytes: 0 %
Lymphocytes Relative: 13 %
Lymphs Abs: 0.7 10*3/uL (ref 0.7–4.0)
MCH: 29 pg (ref 26.0–34.0)
MCHC: 32.6 g/dL (ref 30.0–36.0)
MCV: 88.8 fL (ref 80.0–100.0)
Monocytes Absolute: 0.7 10*3/uL (ref 0.1–1.0)
Monocytes Relative: 13 %
Neutro Abs: 3.9 10*3/uL (ref 1.7–7.7)
Neutrophils Relative %: 71 %
Platelets: 255 10*3/uL (ref 150–400)
RBC: 4.28 MIL/uL (ref 3.87–5.11)
RDW: 13.3 % (ref 11.5–15.5)
WBC: 5.5 10*3/uL (ref 4.0–10.5)
nRBC: 0 % (ref 0.0–0.2)

## 2021-02-03 LAB — CEA (IN HOUSE-CHCC): CEA (CHCC-In House): 122.12 ng/mL — ABNORMAL HIGH (ref 0.00–5.00)

## 2021-02-03 NOTE — Telephone Encounter (Signed)
Scheduled per los. Gave avs and calendar  

## 2021-02-04 ENCOUNTER — Other Ambulatory Visit: Payer: Medicare Other

## 2021-02-04 ENCOUNTER — Ambulatory Visit: Payer: Medicare Other | Admitting: Hematology

## 2021-02-04 LAB — CALCITONIN: Calcitonin: 40781 pg/mL — ABNORMAL HIGH (ref 0.0–5.0)

## 2021-02-08 ENCOUNTER — Other Ambulatory Visit: Payer: Self-pay

## 2021-02-08 ENCOUNTER — Inpatient Hospital Stay: Payer: Medicare Other | Admitting: Nutrition

## 2021-02-08 NOTE — Progress Notes (Signed)
Nutrition follow-up completed with patient and daughter-in-law.  Patient is status post G-tube placement on March 16 and is followed by Dr. 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 Weight improved and documented as 87.5 pounds on April 6. Labs were reviewed. Patient reports her appetite is fair.  She does not enjoy very many foods but is forcing herself to eat.  She has enjoyed sweets such as cookies and pecan pie.  She eats eggs with cheese at breakfast and soup and salad for lunch.  She occasionally will have a hamburger or chicken sandwich.  She has been drinking 1 carton of oral nutrition supplement providing approximately 160 cal and 30 g of protein and another oral nutrition supplement providing 350 cal and 13 g of protein.  She drinks both of these daily.   She is also using 2 cartons of Osmolite 1.5 via her feeding tube daily.  She has a reported 5 pound weight gain over 3 weeks.   She does report occasional diarrhea but that is nothing new.  Estimated nutrition needs: 1250-1450 cal, 45-55 g protein, 1.4 L fluid.  Nutrition diagnosis: Severe malnutrition appears to be improving.  Intervention: Reviewed high-calorie, high-protein foods and encourage patient to find ways to add additional snacks throughout the day. Continue Osmolite 1.5, 2 cartons via G-tube daily. Change oral nutrition supplements to the equivalent of 2 cartons of a 350-calorie supplement daily.  Recommended 1 carton of Ensure complete and 1 carton Ensure Plus or equivalent.  This is the equivalent of an additional 200 cal daily. I encourage patient to find strategies for adding calories by mouth versus increasing tube feeding. I provided additional nutrition facts sheets with contact information.  Monitoring, evaluation, goals: Patient will tolerate increased oral intake along with supplemental tube feeding to promote weight gain.  Next visit: Wednesday, May 11.  **Disclaimer: This note was dictated  with voice recognition software. Similar sounding words can inadvertently be transcribed and this note may contain transcription errors which may not have been corrected upon publication of note.**

## 2021-02-10 ENCOUNTER — Telehealth: Payer: Self-pay

## 2021-02-10 NOTE — Telephone Encounter (Signed)
Pt called in to request treatment with Zometa. Pt had discussed this with Dr Irene Limbo at last visit and she has now decided to proceed. Will give this information to MD so he can order.

## 2021-03-09 ENCOUNTER — Other Ambulatory Visit (INDEPENDENT_AMBULATORY_CARE_PROVIDER_SITE_OTHER): Payer: Medicare Other

## 2021-03-09 ENCOUNTER — Other Ambulatory Visit: Payer: Self-pay

## 2021-03-09 DIAGNOSIS — E890 Postprocedural hypothyroidism: Secondary | ICD-10-CM | POA: Diagnosis not present

## 2021-03-09 LAB — TSH: TSH: 0.7 u[IU]/mL (ref 0.35–4.50)

## 2021-03-10 ENCOUNTER — Inpatient Hospital Stay: Payer: Medicare Other | Attending: Hematology | Admitting: Nutrition

## 2021-03-10 ENCOUNTER — Telehealth: Payer: Self-pay | Admitting: Internal Medicine

## 2021-03-10 NOTE — Telephone Encounter (Signed)
Please let her know that her thyroid test is better and to remain on current levothyroxine dose for now.    Please verify the dose to make sure   Thanks    Abby Nena Jordan, MD  [facility]  [facility] [address] [place], [place] [address] Phone: [phone] FAX: [phone]

## 2021-03-10 NOTE — Progress Notes (Signed)
Nutrition follow-up completed with patient diagnosed with medullary thyroid cancer with mets to lungs.  She received G-tube placement on March 16 and is followed by Dr.Kale. Patient weighed 91.4 pounds today which is improved from 87.5 pounds April 6 and 85.3 pounds March 14.. Patient reports she has a goal weight of 100-110 pounds. Patient denies nausea and vomiting. Reports diarrhea has improved with increased Imodium. She continues to tolerate 1 carton Osmolite 1.5 twice daily via G-tube. She is drinking 1 carton of boost plus or equivalent and 1 carton of Premier protein or equivalent daily. She reports she feels like she eats a lot all day long. I reviewed her food journal and patient appears to be tolerating a variety of foods in small amounts.  Estimated nutrition needs: 1250-1450 cal, 45-55 g protein, 1.4 L fluid.  Nutrition diagnosis: Severe malnutrition improving.  Intervention: Patient to continue to consume high-calorie, high-protein foods frequently throughout the day. Increase oral nutrition supplements to 1 carton Premier protein or equivalent and 2 cartons of boost plus or equivalent daily by mouth. Continue Osmolite 1.2, 2 cartons daily via G-tube. Encourage patient to try adding a snack such as 2 tablespoons of peanut butter by mouth daily.  This would not contribute too much volume and does provide approximately 200 cal. Provided written instructions at patient request.  Monitoring, evaluation, goals: Patient will continue to increase calories and protein to support weight gain.  Next visit: Patient prefers to contact RD with further questions or concerns.   **Disclaimer: This note was dictated with voice recognition software. Similar sounding words can inadvertently be transcribed and this note may contain transcription errors which may not have been corrected upon publication of note.**

## 2021-03-10 NOTE — Telephone Encounter (Signed)
Spoken to patient and notified Dr Quin Hoop comments. Verbalized understanding.   Levothyroxine 125 mcg  Patient is taking 2 tablets on Sundays and 1 tablet rest of the week as instructed

## 2021-04-08 ENCOUNTER — Other Ambulatory Visit: Payer: Self-pay | Admitting: Internal Medicine

## 2021-06-04 ENCOUNTER — Telehealth: Payer: Self-pay | Admitting: Nutrition

## 2021-06-04 NOTE — Telephone Encounter (Signed)
Patient reports appetite is nonexistent.  She thinks she is not getting adequate calories.  She would like to increase Osmolite 1.5-3 cartons a day and increase her boost to 3 cartons a day.  Educated patient to give Osmolite 1.5, 1 carton 3 times daily with 60 mL free water before and after.  Continue to eat by mouth as tolerated.  Instructed patient to call MD for new orders as needed.

## 2021-07-13 ENCOUNTER — Other Ambulatory Visit (HOSPITAL_COMMUNITY): Payer: Self-pay | Admitting: Gastroenterology

## 2021-07-13 DIAGNOSIS — R627 Adult failure to thrive: Secondary | ICD-10-CM

## 2021-07-19 ENCOUNTER — Encounter: Payer: Self-pay | Admitting: Internal Medicine

## 2021-07-19 ENCOUNTER — Ambulatory Visit (INDEPENDENT_AMBULATORY_CARE_PROVIDER_SITE_OTHER): Payer: Medicare Other | Admitting: Internal Medicine

## 2021-07-19 ENCOUNTER — Other Ambulatory Visit: Payer: Self-pay

## 2021-07-19 VITALS — BP 132/70 | HR 100 | Ht 63.0 in | Wt 92.0 lb

## 2021-07-19 DIAGNOSIS — E3122 Multiple endocrine neoplasia [MEN] type IIA: Secondary | ICD-10-CM

## 2021-07-19 DIAGNOSIS — E890 Postprocedural hypothyroidism: Secondary | ICD-10-CM | POA: Diagnosis not present

## 2021-07-19 DIAGNOSIS — C801 Malignant (primary) neoplasm, unspecified: Secondary | ICD-10-CM

## 2021-07-19 LAB — BASIC METABOLIC PANEL
BUN: 17 mg/dL (ref 6–23)
CO2: 30 mEq/L (ref 19–32)
Calcium: 10.2 mg/dL (ref 8.4–10.5)
Chloride: 100 mEq/L (ref 96–112)
Creatinine, Ser: 0.63 mg/dL (ref 0.40–1.20)
GFR: 83.61 mL/min (ref >60.00)
Glucose, Bld: 89 mg/dL (ref 70–99)
Potassium: 4.6 mEq/L (ref 3.5–5.1)
Sodium: 139 mEq/L (ref 135–145)

## 2021-07-19 LAB — ALBUMIN: Albumin: 4.5 g/dL (ref 3.5–5.2)

## 2021-07-19 LAB — VITAMIN D 25 HYDROXY (VIT D DEFICIENCY, FRACTURES): VITD: 48.3 ng/mL (ref 30.00–100.00)

## 2021-07-19 LAB — TSH: TSH: 0.13 u[IU]/mL — ABNORMAL LOW (ref 0.35–5.50)

## 2021-07-19 NOTE — Patient Instructions (Signed)

## 2021-07-19 NOTE — Progress Notes (Signed)
Name: Shannon [name]  MRN/ DOB: [id number], 1941-09-20    Age/ Sex: 80 y.o., female     PCP: Aletha Halim., PA-C   Reason for Endocrinology Evaluation: MEN2A/Medullary Thyroid Cancer     Initial Endocrinology Clinic Visit: 07/15/2019    PATIENT IDENTIFIER: Shannon [name] is a 80 y.o., female with a past medical history of MEN2, osteoporosis and post-surgical hypothyroidism. She has followed with [facility] since 07/15/2019 for consultative assistance with management of her MEN2A.        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    S/P B/L neck dissection 03/2010 S/P redo dissection of the right level VI L.N in 10/2012, largest 2 cm due to mets In 2015  Had a stable right lateral compartment level IV and V L.N . The cystic right level IV L.N previously biopsied 12/2010 negative for mets.  S/P thoracoscopy (08/2014) level IX L.N with medullary mets.      She has had diarrhea in 2010, started on Imodium for years. Somatostatin was not found to be effective.    In 08/2018 was found to have an endometrial mass on MRI, further testing was benign.    She used to follow up at [facility], serial testing has come back negative for pheo or hypercalcemia.      Over the years her calcitonin and CEA levels have been gradually increasing.    She continues with chronic diarrhea, diarrhea has been there since 2010. Treatment was started some time in 2013. She has been on loperamide for years    Stool consistency has improved with consuming protein shakes.     She has been diagnosed with osteoporosis in 2016 and has been on Reclast since then. It was recommended by Hem/Onc to switch zoledrinic acid from annually to Q 3 month due to bone mets but she is considering this    G- tube 20 3/22  SUBJECTIVE:    Today (07/19/2021):  Shannon [name] is here for f/u on medullary carcinoma and MEN  2A   Has been noted with weight gain   Denies local neck symptoms   She continues with chronic diarrhea  , cholestyramine and Imodium are helping  She is on Osmolite through G- tube      She is on calcium BID - has not been taking due to dysphagia She has right shoulder pain , denies jaw pain or back pain   Vitamin D 1000 iu daily  Levothyroxine 125 mcg, 2 tabs on Sunday and 1 tab  daily      HISTORY:  Past Medical History:  Past Medical History:  Diagnosis Date   Anxiety    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 DUCTAL CARCINOMA IN SITU./ RADIATION   CATARACT EXTRACTION W/ INTRAOCULAR LENS IMPLANT  2009   BOTH EYES   FEMUR IM NAIL Left 10/23/2017   Procedure: INTRAMEDULLARY (IM)  NAIL FEMORAL;  Surgeon: Paralee Cancel, MD;  Location: [facility];  Service: Orthopedics;  Laterality: Left;   FOOT SURGERY  1070   RIGHT FOOT - PINCHED NERVE   IR GASTROSTOMY TUBE MOD SED  01/13/2021   LUNG SURGERY     CALCIFICATIONS, POSITIVE - CANCER LYMPHNODE    MELANOMA EXCISION  2008   CHEST AREA   NECK SURGERY  Nov 09 2011   LYMPHNODES REMOVED 2 POSITIVE    RIGHT COLECTOMY  06/02/2009   THYROIDECTOMY  11/06/2009   TONSILLECTOMY AND ADENOIDECTOMY     Social History:  reports that she quit smoking about 60 years ago. She has never used smokeless tobacco. She reports that she does not drink alcohol and does not use drugs. Family History:  Family History  Problem Relation Age of Onset   Cancer Father        throat/ bone   Breast cancer Sister    Cancer Sister         MEDULAR CANCER   Cancer Brother        prostate   Cancer Other        MEDULAR CANCER   Breast cancer Maternal Aunt    Cancer Paternal Aunt        pancreatic    Cancer Cousin        ON MOTHER'S SIDE- LIVER CANCER     HOME MEDICATIONS: Allergies as of 07/19/2021       Reactions   Codeine Other (See Comments)   hyper Other reaction(s): insomnia        Medication List        Accurate as of July 19, 2021 12:45 PM. If you have any questions, ask your nurse or doctor.          ALPRAZolam 1 MG tablet Commonly known as: XANAX Take 0.5 tablets (0.5 mg total) by mouth 2 (two) times daily.   bacitracin ophthalmic ointment   Biotin 1000 MCG tablet 1-2 tablets   CALCIUM 600 + MINERALS PO Take 1 tablet by mouth daily.   Calcium Carb-Cholecalciferol 600-200 MG-UNIT Tabs 2 tablets   cetirizine 10 MG tablet Commonly known as: ZYRTEC Take 10 mg by mouth daily.   cholestyramine 4 g packet Commonly known as: QUESTRAN DISSOLVE 1 PACKET IN LIQUID OF CHOICE AND DRINK 2 TIMES A DAY   Cod Liver Oil 1000 MG Caps Take 1,000 mg by mouth daily.   Glucosamine-Chondroitin 250-200 MG Caps Take 1 tablet by mouth daily. Name of table is SCHIFF   levothyroxine 125 MCG tablet Commonly known as: SYNTHROID Take 1 tablet (125 mcg total) by mouth as directed. 2 tablets on sundays and 1 tablet rest of the week What changed:  how much to take how to take this when to take this additional instructions   loperamide 2 MG tablet Commonly known as: IMODIUM A-D Take 2 mg by mouth 3 (three) times daily.   multivitamin tablet Take 1 tablet by mouth daily.   oxyCODONE-acetaminophen 5-325 MG tablet Commonly known as: PERCOCET/ROXICET Take 1 tablet by mouth every 4 (four) hours as needed for up to 20 doses for severe pain.   pantoprazole 40 MG tablet Commonly known as: PROTONIX Take 40 mg by mouth daily.   simethicone 80 MG chewable tablet Commonly known as: MYLICON Chew  80 mg by mouth 3 (three) times daily.   sucralfate 1 GM/10ML suspension Commonly known as: CARAFATE Take 1 g by mouth 2 (two) times daily.   Tylenol PM Extra  Strength 25-500 MG Tabs tablet Generic drug: diphenhydramine-acetaminophen 1-2 tabs   Vitamin D 1000 units capsule Take 1,000 Units by mouth daily.   vitamin E 180 MG (400 UNITS) capsule 1 tablet   zoledronic acid 5 MG/100ML Soln injection Commonly known as: RECLAST Inject 5 mg into the vein once.          OBJECTIVE:   PHYSICAL EXAM: VS: BP 132/70 (BP Location: Left Arm, Patient Position: Sitting, Cuff Size: Small)   Pulse 100   Ht 5' 3"  (1.6 m)   Wt 92 lb (41.7 kg)   SpO2 96%   BMI 16.30 kg/m    EXAM: General: Pt appears well and is in NAD  Neck: General: Supple without adenopathy. Thyroid: Thyroid surgically removed, no adenopathy noted.   Lungs: Clear with good BS bilat with no rales, rhonchi, or wheezes  Heart: Auscultation: RRR.  Abdomen: Soft with epigastric discomfort   Extremities:  BL LE: No pretibial edema normal ROM and strength.  Mental Status: Judgment, insight: Intact Orientation: Oriented to time, place, and person Mood and affect: No depression, anxiety, or agitation     DATA REVIEWED: Results for Shannon [name], Shannon [name] (MRN [id number]) as of 07/20/2021 12:38  Ref. Range 07/19/2021 13:24  Sodium Latest Ref Range: 135 - 145 mEq/L 139  Potassium Latest Ref Range: 3.5 - 5.1 mEq/L 4.6  Chloride Latest Ref Range: 96 - 112 mEq/L 100  CO2 Latest Ref Range: 19 - 32 mEq/L 30  Glucose Latest Ref Range: 70 - 99 mg/dL 89  BUN Latest Ref Range: 6 - 23 mg/dL 17  Creatinine Latest Ref Range: 0.40 - 1.20 mg/dL 0.63  Calcium Latest Ref Range: 8.4 - 10.5 mg/dL 10.2  Albumin Latest Ref Range: 3.5 - 5.2 g/dL 4.5  GFR Latest Ref Range: >60.00 mL/min 83.61  VITD Latest Ref Range: 30.00 - 100.00 ng/mL 48.30  TSH Latest Ref Range: 0.35 - 5.50 uIU/mL 0.13 (L)    CT neck 07/2020 Pharynx and larynx: Normal. No  mass or swelling.   Salivary glands: No inflammation, mass, or stone.   Thyroid: Postop thyroidectomy. No recurrent tumor in the thyroid bed.   Lymph nodes: Pathologic lymph nodes in the right lower neck compatible with metastatic disease. Many of these have calcification. Lymph nodes are stable since the prior study.   9 mm lymph node anterior to the proximal right common carotid artery unchanged   Cluster of pathologic lymph nodes in the right supraclavicular region many which have calcification unchanged. These measure up to 13 mm.   Pathologic lymph nodes extend into the superior mediastinum in the right paratracheal region as well as in the precarinal region. These are stable since the prior chest CT of 12/06/2019   Vascular: Atherosclerotic calcification aortic arch and proximal great vessels. No vascular occlusion.   Limited intracranial: Negative   Visualized orbits: Negative for mass lesion. Bilateral cataract extraction   Mastoids and visualized paranasal sinuses: Paranasal sinuses clear. Chronic mastoiditis bilaterally. Mild left mastoid effusion.   Skeleton: Multiple sclerotic lesions in the thoracic and cervical spine compatible with bony metastatic disease. No fracture. Stable appearance.   Upper chest: Pleuroparenchymal scarring in the apices bilaterally right greater than left. Chronic calcification in the apical parenchymal density bilaterally is stable.   Other: None   IMPRESSION: Postop thyroidectomy for medullary cancer of the thyroid. Metastatic lymph nodes in the right lower neck extending into the superior mediastinum appear stable from the prior CT. No new or progressive adenopathy.   Sclerotic  metastatic disease in the cervicothoracic spine stable from the prior study.     ASSESSMENT / PLAN / RECOMMENDATIONS:   MEN2A:   - Pt with medullary carcinoma ,  no clinical or biochemical evidence of pheochromocytoma or hypercalcemia  - Due to  for repeat labs and 24-hr urinary catecholamines  - Calcium is normal    2. Medullary Carcinoma:    - S/P thyroidectomy in 2011 followed by dissection and redo of the right level VI L.N in 10/2012, she is also S/P thoracoscopy (08/2014) level IX L.N with medullary mets.  - Stable chest/abdomen  imaging 07/2020 with evidence of mets in the bones - She has been offered TKI's through [facility] but had declined in the past due to risk  - Calcitonin and CEA elevated but stable   3. Postoperative hypothyroidism :   - Pt is clinically euthyroid  -TSH is low, will reduce the dose as below    Medication:  Levothyroxine 125 mcg, 1 tablet daily    4. Chronic diarrhea :   - This is variable  - Continue with Imodium and cholestyramine   Medications Cholestyramine 4 grams BID Loperamide 2 grams TID    5. Weight Gain:   - She has been noted with weight gain while on the PEG, she feels well. She was advised to take Calcium through PEG tube    6. Bone Mets:   - It has ben recommended by Oncology to change Zoledronic acid from annual to Q 3 months, we discussed this today, there seems to be a miscommunication with oncology as she is under the impression that she agreed to this but have not heard from them. Pt will discuss this next month with oncology        F/U in 6 months     Signed electronically by: Mack Guise, MD  [facility] Endocrinology  [facility] [address] [place], [place] [address] Phone: [phone] FAX: [phone]      CC: Amie Critchley [address] [place] [address] Phone: [phone]  Fax: [phone]   Return to Endocrinology clinic as below: Future Appointments  Date Time Provider [place]  07/19/2021  1:00 PM Joenathan Sakuma, Melanie Crazier, MD [facility] None  07/23/2021 12:30 PM [facility] 1 [facility] [place]  08/03/2021  1:45 PM [facility] None  08/03/2021  2:20 PM  Brunetta Genera, MD [facility] None

## 2021-07-20 ENCOUNTER — Telehealth: Payer: Self-pay | Admitting: Internal Medicine

## 2021-07-20 NOTE — Telephone Encounter (Signed)
Vm left for patient to callback for message below

## 2021-07-20 NOTE — Telephone Encounter (Signed)
Please let the pt know that the current dose oflevothyroxine 2 tablets on Sundays and 1 tab the rest of the week is TOO much   We need to reduce to 1 tablet daily ( including Sundays - no more 2 tabs on sundays   Her  kidney function and vitamin D are normal    Her calcium is normal, but she needs to take calcium though the PEG tube, she needs to discuss with dietary on how to do that      Thanks

## 2021-07-21 LAB — CEA: CEA: 199.4 ng/mL — ABNORMAL HIGH

## 2021-07-21 LAB — CALCITONIN: Calcitonin: 44921 pg/mL — ABNORMAL HIGH (ref <=5)

## 2021-07-23 ENCOUNTER — Encounter (HOSPITAL_COMMUNITY): Payer: Self-pay

## 2021-07-23 ENCOUNTER — Other Ambulatory Visit: Payer: Self-pay

## 2021-07-23 ENCOUNTER — Ambulatory Visit (HOSPITAL_COMMUNITY)
Admission: RE | Admit: 2021-07-23 | Discharge: 2021-07-23 | Disposition: A | Discharge status: Home / Self Care | Payer: Medicare Other | Source: Ambulatory Visit | Attending: Gastroenterology | Admitting: Gastroenterology

## 2021-07-23 DIAGNOSIS — R627 Adult failure to thrive: Secondary | ICD-10-CM

## 2021-07-26 ENCOUNTER — Ambulatory Visit (HOSPITAL_COMMUNITY)
Admission: RE | Admit: 2021-07-26 | Discharge: 2021-07-26 | Disposition: A | Discharge status: Home / Self Care | Payer: Medicare Other | Source: Ambulatory Visit | Attending: Gastroenterology | Admitting: Gastroenterology

## 2021-07-26 ENCOUNTER — Other Ambulatory Visit: Payer: Medicare Other

## 2021-07-26 ENCOUNTER — Other Ambulatory Visit: Payer: Self-pay

## 2021-07-26 DIAGNOSIS — E3122 Multiple endocrine neoplasia [MEN] type IIA: Secondary | ICD-10-CM

## 2021-07-26 NOTE — Telephone Encounter (Signed)
Left a message to call back on 07/26/2021 at 9:45 AM     Warren, MD  [facility] Endocrinology  [facility] [address] [place], [place] [address] Phone: [phone] FAX: [phone]

## 2021-07-30 LAB — CATECHOLAMINES, FRACTIONATED, URINE, 24 HOUR
Calc Total (E+NE): 40 mcg/24 h (ref 26–121)
Creatinine, Urine mg/day-CATEUR: 0.45 g/(24.h) — ABNORMAL LOW (ref 0.50–2.15)
Dopamine 24 Hr Urine: 183 mcg/24 h (ref 52–480)
Norepinephrine, 24H, Ur: 40 mcg/24 h (ref 15–100)
Total Volume: 850 mL

## 2021-07-30 LAB — EXTRA SPECIMEN

## 2021-07-30 MED ORDER — LEVOTHYROXINE SODIUM 125 MCG PO TABS: 125.0000 ug | ORAL_TABLET | Freq: Every day | ORAL | 3 refills | Status: DC

## 2021-07-30 NOTE — Telephone Encounter (Signed)
Spoke to the patient on 07/30/2021 at 12:15 PM   I have advised the patient to reduce her levothyroxine to 1 tablet daily from now on including Sundays.    With also discussed her gradual increase in calcitonin, she has an upcoming appointment with Dr. Irene Limbo , I have suggested to her to discuss any options for chemotherapy such as TKI or RET inhibitors and to discuss if she would qualify or if that benefit would outweigh the risk   Patient expressed understanding   Abby Nena Jordan, MD  [facility] Endocrinology  [facility] [address] [place], [place] [address] Phone: [phone] FAX: [phone]

## 2021-08-02 ENCOUNTER — Encounter: Payer: Self-pay | Admitting: Internal Medicine

## 2021-08-03 ENCOUNTER — Other Ambulatory Visit: Payer: Self-pay

## 2021-08-03 ENCOUNTER — Inpatient Hospital Stay: Payer: Medicare Other

## 2021-08-03 ENCOUNTER — Inpatient Hospital Stay: Payer: Medicare Other | Attending: Hematology | Admitting: Hematology

## 2021-08-03 VITALS — BP 128/62 | HR 98 | Temp 97.8°F | Resp 17 | Ht 63.0 in | Wt 92.8 lb

## 2021-08-03 DIAGNOSIS — C73 Malignant neoplasm of thyroid gland: Secondary | ICD-10-CM

## 2021-08-03 DIAGNOSIS — R197 Diarrhea, unspecified: Secondary | ICD-10-CM | POA: Insufficient documentation

## 2021-08-03 DIAGNOSIS — C7951 Secondary malignant neoplasm of bone: Secondary | ICD-10-CM | POA: Diagnosis not present

## 2021-08-03 DIAGNOSIS — Z85118 Personal history of other malignant neoplasm of bronchus and lung: Secondary | ICD-10-CM | POA: Diagnosis not present

## 2021-08-03 DIAGNOSIS — R634 Abnormal weight loss: Secondary | ICD-10-CM | POA: Diagnosis not present

## 2021-08-03 DIAGNOSIS — M810 Age-related osteoporosis without current pathological fracture: Secondary | ICD-10-CM | POA: Diagnosis not present

## 2021-08-03 LAB — CBC WITH DIFFERENTIAL (CANCER CENTER ONLY)
Abs Immature Granulocytes: 0.01 10*3/uL (ref 0.00–0.07)
Basophils Absolute: 0 10*3/uL (ref 0.0–0.1)
Basophils Relative: 1 %
Eosinophils Absolute: 0.1 10*3/uL (ref 0.0–0.5)
Eosinophils Relative: 1 %
HCT: 39.8 % (ref 36.0–46.0)
Hemoglobin: 12.9 g/dL (ref 12.0–15.0)
Immature Granulocytes: 0 %
Lymphocytes Relative: 12 %
Lymphs Abs: 0.6 10*3/uL — ABNORMAL LOW (ref 0.7–4.0)
MCH: 27.7 pg (ref 26.0–34.0)
MCHC: 32.4 g/dL (ref 30.0–36.0)
MCV: 85.6 fL (ref 80.0–100.0)
Monocytes Absolute: 0.7 10*3/uL (ref 0.1–1.0)
Monocytes Relative: 13 %
Neutro Abs: 3.8 10*3/uL (ref 1.7–7.7)
Neutrophils Relative %: 73 %
Platelet Count: 221 10*3/uL (ref 150–400)
RBC: 4.65 MIL/uL (ref 3.87–5.11)
RDW: 13.5 % (ref 11.5–15.5)
WBC Count: 5.2 10*3/uL (ref 4.0–10.5)
nRBC: 0 % (ref 0.0–0.2)

## 2021-08-03 LAB — CMP (CANCER CENTER ONLY)
ALT: 14 U/L (ref 0–44)
AST: 20 U/L (ref 15–41)
Albumin: 3.9 g/dL (ref 3.5–5.0)
Alkaline Phosphatase: 87 U/L (ref 38–126)
Anion gap: 10 (ref 5–15)
BUN: 18 mg/dL (ref 8–23)
CO2: 29 mmol/L (ref 22–32)
Calcium: 9.6 mg/dL (ref 8.9–10.3)
Chloride: 101 mmol/L (ref 98–111)
Creatinine: 0.72 mg/dL (ref 0.44–1.00)
GFR, Estimated: >60 mL/min (ref >60)
Glucose, Bld: 96 mg/dL (ref 70–99)
Potassium: 4.2 mmol/L (ref 3.5–5.1)
Sodium: 140 mmol/L (ref 135–145)
Total Bilirubin: 0.7 mg/dL (ref 0.3–1.2)
Total Protein: 7 g/dL (ref 6.5–8.1)

## 2021-08-03 LAB — CEA (IN HOUSE-CHCC): CEA (CHCC-In House): 131.69 ng/mL — ABNORMAL HIGH (ref 0.00–5.00)

## 2021-08-09 ENCOUNTER — Encounter: Payer: Self-pay | Admitting: Hematology

## 2021-08-09 DIAGNOSIS — C7951 Secondary malignant neoplasm of bone: Secondary | ICD-10-CM | POA: Insufficient documentation

## 2021-08-09 MED ORDER — ONDANSETRON HCL 8 MG PO TABS
8.0000 mg | ORAL_TABLET | Freq: Three times a day (TID) | ORAL | 0 refills | Status: DC | PRN
  Filled 2021-08-09: qty 30, 10d supply, fill #0

## 2021-08-09 MED ORDER — SUCRALFATE 1 G PO TABS: 1.0000 g | ORAL_TABLET | Freq: Three times a day (TID) | ORAL | 0 refills | Status: DC

## 2021-08-09 MED ORDER — CABOMETYX 20 MG PO TABS
20.0000 mg | ORAL_TABLET | Freq: Every day | ORAL | 0 refills | Status: DC
  Filled 2021-08-09: qty 30, 30d supply, fill #0

## 2021-08-09 MED ORDER — PANTOPRAZOLE SODIUM 40 MG PO TBEC: 40.0000 mg | DELAYED_RELEASE_TABLET | Freq: Two times a day (BID) | ORAL | 1 refills | Status: DC

## 2021-08-09 NOTE — Progress Notes (Addendum)
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 total thyroidectomy in January of 2011 and bilateral neck dissection in June of 2011.  B. Persistent hypercalcitoninemia postoperatively. Calcitonin 499 in 07/2011  C. December 2012: CT of neck/chest was negative for metastases D. She had a normal neck ultrasound in 01/2012 showing her cystic LN was smaller (the one biopsied and negative).  E. FDG PET 08/2012 due to her last calcitonin raising substantially to 739 from the 500s: concerning right cervical lymph node F. 11/08/2012: Level 6 LN dissection. She had 2/2 LN positive, largest 2cm for MTC. Calcitonin one week later from her surgery was down to 515.  G. Calcitonin 592 on 05/24/2013. Repeat ultrasound on that same day which showed 2 at least partially cystic lymph nodes at level 4 which had been previously biopsied and negative; decreased in size since 2012. It also showed a subcentimeter right level VB LN of low suspicion. H. Her metanephrines (plasma) and PTH were normal in 12/2013 Urine studies for pheo were negative in 12/2013. I. 08/04/2014: CEA 9.0; calcitonin 1019 J. 09/05/2014:  - C/A/P CT scan: Interval development of a new calcified nodule in the left lower lobe. - Bone scan: Slight asymmetric increased radiotracer uptake in the right lateral calvarium on the frontal radiograph - Neck U/S: Stable right lateral compartment level 4 and 5B lymph nodes. The cystic right level 4 lymph node, previously biopsied on 01/14/2011 with negative pathology results.  K. 09/23/2014: Thoracoscopy: level 9 LN: Metastatic medullary thyroid carcinoma.  L.  03/11/2015: CEA 15.9; calcitonin 1149 M 04-13-2015:  - C/A/P CT scan: Previously reported left lower lobe pulmonary nodule is no longer identified. No new or enlarging pulmonary nodules. No evidence of metastatic disease in the abdomen or pelvis. - CT brain:No acute intracranial abnormalities.   New small CSF density subdural fluid collection overlying the right cerebral hemisphere, likely a small hygroma of uncertain etiology. There is no associated bony or parenchymal lesion. 04-13-2015: Bone scan: A non-specific, very small focus of increased tracer activity in the right frontal calvarium is not significantly changed from the prior study. - Neck U/S: There is hypoechoic lesion with questionable microcalcification located in right level IV, it is 0.4 x 0.6 x 0.9 cm. No other pathologic cervical lymphadenopathy seen.  N. 04/13/2015: CEA 14.6; calcitonin 1324 O. 07/16/2015 ([facility]): CEA 19.4 (outsode of [facility]); CAlcitonin 1860. P. 09-07-2015: Neck, Chest , Abd, [place] CT: Stable post-thyroidectomy changes without evidence of recurrent or metastatic disease. Stable pulmonary nodules. Hepatic cysts. No bone mets. CEA: 16; Calcitonin 1533 Q. 03-08-2016: C/A/P CT scan: No definite evidence of metastatic disease in the chest, abdomen or pelvis. Compared to 09/07/2015, there is a new linear 3 mm right lower lobe pulmonary nodule which is indeterminate and may represent focal atelectasis CEA 16.5; CAlcitonin 1839  R. 08-22-2016: Calcitonin: 4596; CEA 27.1  S. 09-06-2016:  - Bone scan: Redemonstrated likely two foci of increased radiotracer uptake within the right calvarium, similar to prior study. - Neck CT: No evidence of recurrent or metastatic disease in the neck. - C/A/P CT: No definite evidence of metastatic disease in the chest, abdomen, or pelvis. - Neck CT: Status  post thyroidectomy and lymph node dissection without evidence of recurrent or metastatic disease.  T. Bone scan: A focus of increased tracer  activity projecting over the posterior right approximate 8th rib is non-specific; this finding may relate to summation of tracer activity in the rib with the inferior tip of the overlying right scapula, post-traumatic change or skeletal metastasis; recommend attention on follow-up. Re-demonstration of subtly increased tracer activity in the right frontal calvarium, stable since 09/05/2014. U. 02-13-2017:  - C/A/P CT scan: nonenlarged mediastinal lymph nodes and soft tissue nodule in the right thyroidectomy bed. Unchanged irregular opacities in the lung apices bilaterally, which may represent treated metastases or scarring.  - Neck CT: Numerous soft tissue enhancing lesions in the superior mediastinum. While these are stable when compared to most recent CT, several of these lesions demonstrate minimal growth from the more remote exam on 09/07/2015. No new enhancing lesions are present. - CEA 25.1, Calcitonin: 5498 V. 08-28-2017: fractionate plasma free metanephrines: Normetanephrine 178 (24hr urine pending as this was thought to be non-specific), Metanephrine: 44 (normal). CEA 46; Calcitonin 9763 W. 09-27-2017: Bone scan: Stable increased radiotracer activity in the right frontal calvarium, indeterminate. No other evidence of osseous metastasis Chest CT/Abd: Mildly enlarged mediastinal lymph nodes are stable. No evidence of metastatic disease in the abdomen   HISTORY OF PRESENTING ILLNESS:   Shannon [name] is a wonderful 80 y.o. female who has been referred to [place] by [place] oncologist Dr. Leslie Andrea for management of Metastatic Medullary Thyroid Cancer. She is looking for a Financial controller in [place].  Today she is accompanied by her sister. Patient notes this started with a nodule in her throat and her endocrinologist started further work up. She has been seeing Med onc at [facility] since her first surgery every 3-6 months.   She has had thyroid surgery and Ldissection but no systemic  therapy as detailed above in the oncologic history.  She notes her family had testing for MEN2A mutation. Her sister, her nephew carry the gene. She notes her maternal aunt had goiters but not sure of the cause.   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 She has had diarrhea since 2010. Her last colonoscopy was in 2010. She has tried multiple treatment options but this continues to worsen over time. She currently takes imodium 4-6 X daily, cholestyramine twice daily. She started monthly Sandostatin on 10/20/17. She fell and broke her hip on 10/23/17 and had surgery same day. She was given hydrocodone which helped her diarrhea and is not sure if the Sandostatin is working for her. She has been on oral iron and she is on aspirin 317m since her hip surgery. She does PT exercises and has been healing well.    She recently stopped caffeine tablets that she was taking for energy. She stopped 3 months ago. She is on Reclast for her osteoporosis, which is managed by her PCP, Dr. KDeatra Ina Pt notes she stopped smoking about 40 years ago and she rarely drinks alcohol.   On review of symptoms, pt notes continued diarrhea, stable energy, weight loss of 6-8 pounds in the last 6 months. Her appetite is adequate. Her diarrhea occurs currently twice a day with "pudding" form to liquid form stool. She can go up to 5-7 times a day at times. She denies cramping with diarrhea. She denies blood, and mucus in stool. Current use of oral iron causes some black stool, which was not present before use.  INTERVAL HISTORY:  Eilish Mcdaniel returns today regarding her Metastatic Medullary Thyroid Cancer. The patient's last visit with [place] was on 02/03/2021.  Patient notes she is having progressive fatigue.  Still with poor appetite.  Notes severe heartburns.  Has been following with GI but notes that these have not been per ameliorated. Discussed using PPI and sucralfate patient will give this a  try.  Patient notes her fatigue has gotten worse and she would want to consider treatment. We discussed given her symptomatic disease and increasing tumor markers though do she does not have overt organ threatening disease would offer her option to be treated with cabozantinib and also address bone metastases with Zometa. Patient is agreeable to proceed with this. Follow the patient's questions regarding this were answered.  Patient continues to follow-up with endocrinology for management of her levothyroxine replacement screening and evaluation for hyper para thyroidism and pheochromocytomas.  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 DUCTAL CARCINOMA IN SITU./ RADIATION   CATARACT EXTRACTION W/ INTRAOCULAR LENS IMPLANT  2009   BOTH EYES   FEMUR IM NAIL Left 10/23/2017   Procedure: INTRAMEDULLARY (IM) NAIL FEMORAL;  Surgeon: Paralee Cancel, MD;  Location: [facility];  Service: Orthopedics;  Laterality: Left;   FOOT SURGERY  1070   RIGHT FOOT - PINCHED NERVE   IR GASTROSTOMY TUBE MOD SED  01/13/2021   LUNG SURGERY     CALCIFICATIONS, POSITIVE - CANCER LYMPHNODE    MELANOMA EXCISION  2008   CHEST AREA   NECK SURGERY  Nov 09 2011   LYMPHNODES REMOVED 2 POSITIVE    RIGHT COLECTOMY  06/02/2009   THYROIDECTOMY  11/06/2009   TONSILLECTOMY AND ADENOIDECTOMY      SOCIAL HISTORY: Social History   Socioeconomic History   Marital status: Married    Spouse name: Not on file   Number of children: Not on  file   Years of education: Not on file   Highest education level: Not on file  Occupational History   Not on file  Tobacco Use   Smoking status: Former    Types: Cigarettes    Quit date: 08/10/1960    Years since quitting: 61.0   Smokeless tobacco: Never  Vaping Use   Vaping Use: Never used  Substance and Sexual Activity   Alcohol use: No    Alcohol/week: 0.0 standard drinks   Drug use: No   Sexual activity: Never  Other Topics Concern   Not on file  Social History Narrative   Not on file   Social Determinants of Health   Financial Resource Strain: Not on file  Food Insecurity: Not on file  Transportation Needs: Not on file  Physical Activity: Not on file  Stress: Not on file  Social Connections: Not on file  Intimate Partner Violence: Not on file    FAMILY HISTORY: Family History  Problem Relation Age of Onset   Cancer Father  throat/ bone   Breast cancer Sister    Cancer Sister        MEDULAR CANCER   Cancer Brother        prostate   Cancer Other        MEDULAR CANCER   Breast cancer Maternal Aunt    Cancer Paternal Aunt        pancreatic    Cancer Cousin        ON MOTHER'S SIDE- LIVER CANCER    ALLERGIES:  is allergic to codeine.  MEDICATIONS:  Current Outpatient Medications  Medication Sig Dispense Refill   ALPRAZolam (XANAX) 1 MG tablet Take 0.5 tablets (0.5 mg total) by mouth 2 (two) times daily. 6 tablet 0   bacitracin ophthalmic ointment      Calcium Carb-Cholecalciferol 600-200 MG-UNIT TABS 2 tablets     Calcium Carbonate-Vit D-Min (CALCIUM 600 + MINERALS PO) Take 1 tablet by mouth daily.     cetirizine (ZYRTEC) 10 MG tablet Take 10 mg by mouth daily.     Cholecalciferol (VITAMIN D) 1000 UNITS capsule Take 1,000 Units by mouth daily.     cholestyramine (QUESTRAN) 4 g packet DISSOLVE 1 PACKET IN LIQUID OF CHOICE AND DRINK 2 TIMES A DAY 60 packet 5   Cod Liver Oil 1000 MG CAPS Take 1,000 mg by mouth daily.      diphenhydramine-acetaminophen (TYLENOL PM EXTRA STRENGTH) 25-500 MG TABS tablet 1-2 tabs     Glucosamine-Chondroitin 250-200 MG CAPS Take 1 tablet by mouth daily. Name of table is SCHIFF     levothyroxine (SYNTHROID) 125 MCG tablet Take 1 tablet (125 mcg total) by mouth daily before breakfast. 2 tablets on sundays and 1 tablet rest of the week 90 tablet 3   loperamide (IMODIUM A-D) 2 MG tablet Take 2 mg by mouth 3 (three) times daily.     Multiple Vitamin (MULTIVITAMIN) tablet Take 1 tablet by mouth daily.     pantoprazole (PROTONIX) 40 MG tablet Take 40 mg by mouth daily.     simethicone (MYLICON) 80 MG chewable tablet Chew 80 mg by mouth 3 (three) times daily.  (Patient not taking: Reported on 07/19/2021)     vitamin E 180 MG (400 UNITS) capsule 1 tablet     zoledronic acid (RECLAST) 5 MG/100ML SOLN injection Inject 5 mg into the vein once.     No current facility-administered medications for this visit.    REVIEW OF SYSTEMS:   .10 Point review of Systems was done is negative except as noted above.  PHYSICAL EXAMINATION: ECOG PERFORMANCE STATUS: 1 - Symptomatic but completely ambulatory   Vitals:   08/03/21 1441  BP: 128/62  Pulse: 98  Resp: 17  Temp: 97.8 F (36.6 C)  TempSrc: Oral  SpO2: 98%  Weight: 92 lb 12.8 oz (42.1 kg)  Height: $Remove'5\' 3"'FeyWUwl$  (1.6 m)    . GENERAL:alert, in no acute distress and comfortable SKIN: no acute rashes, no significant lesions EYES: conjunctiva are pink and non-injected, sclera anicteric OROPHARYNX: MMM, no exudates, no oropharyngeal erythema or ulceration NECK: supple, no JVD LYMPH:  no palpable lymphadenopathy in the cervical, axillary or inguinal regions LUNGS: clear to auscultation b/l with normal respiratory effort HEART: regular rate & rhythm ABDOMEN:  normoactive bowel sounds , non tender, not distended. Extremity: no pedal edema PSYCH: alert & oriented x 3 with fluent speech NEURO: no focal motor/sensory deficits  LABORATORY DATA:  I have  reviewed the data as listed  . CBC Latest Ref Rng &  Units 08/03/2021 02/03/2021 01/13/2021  WBC 4.0 - 10.5 K/uL 5.2 5.5 7.5  Hemoglobin 12.0 - 15.0 g/dL 12.9 12.4 14.0  Hematocrit 36.0 - 46.0 % 39.8 38.0 43.3  Platelets 150 - 400 K/uL 221 255 226   . CMP Latest Ref Rng & Units 08/03/2021 07/19/2021 02/03/2021  Glucose 70 - 99 mg/dL 96 89 90  BUN 8 - 23 mg/dL $Remove'18 17 18  'xYbazJh$ Creatinine 0.44 - 1.00 mg/dL 0.72 0.63 0.71  Sodium 135 - 145 mmol/L 140 139 142  Potassium 3.5 - 5.1 mmol/L 4.2 4.6 4.5  Chloride 98 - 111 mmol/L 101 100 101  CO2 22 - 32 mmol/L $RemoveB'29 30 31  'JscQurAj$ Calcium 8.9 - 10.3 mg/dL 9.6 10.2 8.7(L)  Total Protein 6.5 - 8.1 g/dL 7.0 - 7.0  Total Bilirubin 0.3 - 1.2 mg/dL 0.7 - 0.5  Alkaline Phos 38 - 126 U/L 87 - 99  AST 15 - 41 U/L 20 - 25  ALT 0 - 44 U/L 14 - 19   02/12/18 Calcitonin:     PATHOLOGY  Lung Biopsy at [facility] 09/23/14 DIAGNOSIS A. Pleural nodule, biopsy:  Calcified and hyalinized remote fat necrosis. No evidence of malignancy.   B. Level 9 lymph node, biopsy:  Metastatic medullary thyroid carcinoma. See comment.  Comment:: A note is made of the patient's history of medullary thyroid carcinoma with documented prior metastatic disease to cervical lymph nodes ([id number]; 04/05/2010).  The prior metastases to cervical lymph nodes are reviewed in conjunction to the current case and the tumors are morphologically similar.   Confirmatory immunohistochemical stains are performed. Tumor cells stain positively for calcitonin and for an anticytokeratin cocktail. The morphologic and immunophenotypic findings support metastatic medullary thyroid carcinoma.   C. Level 8 lymph node, biopsy:  Profiles of lymph nodes, negative for malignancy.   D. Pleural nodule #2, biopsy:  Fibrovascular tissue with extensive cauterization. No evidence of malignancy.        Surgical Pathology at [facility] 11/08/12 Immunohistochemical Findings An immunohistochemical stain for calcitonin obtained  on paraffin block A1 is positive.    Diagnosis A. "RIGHT LEVEL 6 LYMPH NODES" (BIOPSY):      METASTATIC MEDULLARY THYROID CARCINOMA IN TWO LYMPH NODES (2/2).     SIZE OF LARGEST METASTASIS:  2 CM.     EXTRANODAL INVASION:  PRESENT. Comment:  I certify that I personally conducted the diagnostic evaluation of the above  specimen(s) and have rendered the above diagnosis(es).                             Rex C. Telford Nab, M.D. Telephone:[phone]                             Electronically signed: 11/16/12   A. "Right level 6 lymph nodes", received fresh and placed in formalin on 1/9/ two fragments of tan-red soft tissue.  Fragment 1 is 1.3 x 1 x 0.6 cm and is bisected and submitted in block A1.  Fragment 2 is 2 x 1 x 0.4 cm and is submitted entirely in block A2.    RADIOGRAPHIC STUDIES: I have personally reviewed the radiological images as listed and agreed with the findings in the report. No results found.    CT Chest w Contrast w 3D MIPS Protocol at [facility] 09/29/17 Impression: 1.  Mildly enlarged mediastinal lymph nodes are stable when compared to most recent CT. Although, several nodes are minimally  increased from 03/08/2016.  2.  Refer to separately dictated abdominal CT report for findings below the diaphragm.  Bone Scan Whole Body at [facility] 09/27/17 Impression: 1. Stable foci of increased radiotracer activity in the right frontal calvarium, indeterminate. 2. No new scintigraphic evidence of osseous metastasis.  US Lymph Node Neck Mapping at [facility] 08/29/17 Impression: 1.  Newly measured right level 5 lymph node (lymph node #5) with abnormal rounded morphology, no echogenic hilum, and microcalcifications. Although biopsy of this nodule may be technically difficult due to its location deep to the carotid vasculature, ultrasound-guided biopsy is recommended (if possible).  2.  Newly measured right level 2A lymph node without suspicious features.  3.  Additional right neck nodes  are unchanged.   Bone Density Scan 02/27/17 @ [facility]  Lowest Site Measured: Total Left hip with T-score of -3.1 and BMD of 0.568   CT CAP W Contrast W MIPS at [facility] 02/13/17 Impression: 1. Please see CT neck completed same day for findings in the neck. 2. Stable nonenlarged mediastinal lymph nodes and soft tissue nodule in the right thyroidectomy bed. 3. Unchanged irregular opacities in the lung apices bilaterally, which may represent treated metastases or scarring. No new metastatic disease in the chest, abdomen, or pelvis.  CT Nect Soft Tissue W Contrast at [facility] 02/13/17 IMPRESSION: Numerous soft tissue enhancing lesions in the superior mediastinum. While these are stable when compared to most recent CT, several of these lesions demonstrate minimal growth from the more remote exam on 09/07/2015. No new enhancing lesions are present.  ASSESSMENT & PLAN:   Shannon [name] is a 79 y.o. caucasian female with   1. 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  She had a level 6 LN dissection on 11/08/12 with 2/2 LN positive, largest 2cm for MTC.  09/05/14 CAP CT with development of new calcified nodules in the left lower lobe 09/23/14 Lung biopsy showed Level 9 lymph node, positive for malignancy and metastatic cancer which was resected. Treatment for symptomatic disease with vandetanib and cabozantibnib were previously discussed with her 62 Oncologist but the patient had decided to hold off on treatment due to concerns for toxicities and lack of overt cancer related progressive symptoms. Latest scans from 08/2017 are stable, no disease found below chest or in the bones. Her calcitonin levels have been increasing since 2015. Overall pt currently asymptomatic.   CT chest on 10/17/2018 which revealed "1. Unchanged mediastinal  and right supraclavicular lymphadenopathy. Findings are nonspecific, however metastatic disease is not excluded. 2. Stable biapical scarring and right apical consolidative focus with associated calcification, likely postinfectious in nature."  10/11/2018 CT angiography chest w contrast revealed "1. No demonstrable pulmonary embolus. No thoracic aortic aneurysm or dissection. There are foci of aortic atherosclerosis as well as great vessel and coronary artery calcifications. 2. Fibrosis and cicatrization in the apices with apical pleural calcification, likely due to scarring. There is focal airspace opacity which is asymmetric in the apical segment of the right upper lobe. This finding may well be due to chronic scarring. A potential focus of pneumonia or possible neoplastic involvement in this area must be of concern. This finding may well warrant correlation with nuclear medicine PET study to assess for abnormal metabolic activity. At a minimum, a follow-up chest CT in 3 months to assess for stability of this area would be advised. No other airspace  consolidation noted. Areas of patchy atelectasis noted. No well-defined nodular type lesions noted in the lung parenchyma. 3. Probable sclerotic bony metastases at C3 and L1. Sclerotic focus at L1 is incompletely visualized. Medullary thyroid carcinoma as well as breast carcinoma potentially may present with sclerotic as opposed to lytic metastases. 4.  No demonstrable thoracic adenopathy. 5. Extensive postoperative change in the thyroid region with evidence of total thyroidectomy. 6. Mild reflux into the inferior vena cava and hepatic veins suggests increase in right heart pressure. Aortic Atherosclerosis (ICD10-I70.0)."  12/06/2019 CT C/A/P ([id number]) ([id number]) revealed "1. Stable exam. No new or progressive findings to suggest progressive metastatic disease. 2. Scattered sclerotic bone lesions, similar to prior. 3. Stable mild intrahepatic and extrahepatic  biliary duct dilatation. 4. Trace free fluid in the cul-de-sac."  12/06/2019 CT Soft Tissue Neck ([id number]) revealed "1. Thyroidectomy with nodal metastases at the thoracic inlet/anterior compartment and right supraclavicular fossa. There are no prior neck CT comparison is available for review. 2. Spinal osseous metastatic disease without visible extraosseous tumor extension."  06/22/2020 CT C/A/P ([id number]) ([id number]) revealed "1. Borderline enlarged supraclavicular and mediastinal lymph nodes, stable. 2. Osseous metastatic disease, stable. 3. Left lower lobe aspiration and trace left pleural fluid. 4. Hepatomegaly. 5. Aortic atherosclerosis."  2. 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 -Secondary to hypercalcitoninemia  -Currently on imodium daily, cholestyramine twice daily  -was previously on monthly Sandostatin injections in 10/20/17- but patient decided to hold citing no real benefit from adding this -Her last colonoscopy was in 2010  PLAN:  -Discussed pt labwork today, 08/03/2021 CBC within normal limits, CMP within normal limits, CEA elevated to 131 Calcitonin pending -Continue cholestyramine and as needed Lomotil for diarrhea -PPI and sucralfate for severe heartburns  -Continue management of hypothyroidism as per endocrinology. - starting Zometa q14months instead of Reclast qyearly due to known bone tumors.  -Given patient's continued worsening fatigue, anorexia and inability to gain weight and increasing tumor markers suggesting progression of disease patient would like to consider starting treatment. -We discussed about the pros and cons of starting treatment and made a decision to start her on cabozantinib for symptomatic metastatic medullary thyroid cancer. Given patients frailty will start gradually and advance dose as tolerated from Cometriq $RemoveBefor'60mg'trllVrXoxHBO$  po daily -Recommended pt take 2000 IU Vitamin D daily. -Recommended pt continue to f/u w Ernestene Kiel  for active management of food intake and tube feeding needs.  3. Osteoporosis - Last DEXA 02/27/17 with Lowest Site Measured: Total Left hip with T-score of -3.1 and BMD of 0.568 Closed left hip fracture from fall -s/p surgery on 10/23/17 with rod placement - healing well.  -She takes Reclast injections for her osteoporosis. Managed by her PCP   4. Chronic oropharyngeal Dysphagia ? Related to previous neck surgery. Dry mouth etc. No acute changes  Plan -ENT referral for further evaluation of this since patient notes it is bothersome -discussed input regarding mx of dry mouth.   FOLLOW UP:  Plz schedule to start Zometa q37months starting in 2 weeks with labs Startng Cabometriq for metastatic medullary thyroid cancer RTC with Dr Irene Limbo with labs in 4 weeks   . The total time spent in the appointment was 32 minutes and more than 50% was on counseling and direct patient cares.   All of the patient's questions were answered with apparent satisfaction. The patient knows to call the clinic with any problems, questions or concerns.   Sullivan Lone MD [place] AAHIVMS [facility] Hematology/Oncology  Physician [facility]

## 2021-08-10 ENCOUNTER — Encounter: Payer: Self-pay | Admitting: Hematology

## 2021-08-10 ENCOUNTER — Telehealth: Payer: Self-pay | Admitting: Pharmacist

## 2021-08-10 ENCOUNTER — Other Ambulatory Visit (HOSPITAL_COMMUNITY): Payer: Self-pay

## 2021-08-10 ENCOUNTER — Telehealth: Payer: Self-pay

## 2021-08-10 MED ORDER — CABOZANTINIB S-MALATE 3 X 20 MG PO KIT
60.0000 mg | PACK | Freq: Every day | ORAL | 0 refills | Status: DC
  Filled 2021-08-10: qty 1, 1d supply, fill #0

## 2021-08-10 NOTE — Telephone Encounter (Signed)
Oral Oncology Patient Advocate Encounter   Received notification from [facility] that prior authorization for Cometriq is required.   PA submitted on CoverMyMeds Key [id number] Status is pending   Oral Oncology Clinic will continue to follow.  [place] Patient [place] Phone [phone] Fax [phone] 08/10/2021 4:11 PM

## 2021-08-10 NOTE — Telephone Encounter (Signed)
Oral Oncology Pharmacist Encounter  Received new prescription for Cometriq (cabozantinib) for the treatment of metastatic medullary thyroid cancer, planned duration until disease progression or unacceptable drug toxicity.  CBC w/ Diff and CMP from 08/03/21 assessed, no relevant lab abnormalities noted. Prescription dose and frequency assessed for appropriateness. Patient will start at lowest dose of Cometriq (60 mg daily) and be titrated up as tolerated per MD.   Current medication list in Epic reviewed, no relevant/significant DDIs with Cometriq identified.  Evaluated chart and no patient barriers to medication adherence noted.   Patient agreement for treatment documented in MD note on 08/03/21.  Prescription has been e-scribed to the [facility] for benefits analysis and approval.  Oral Oncology Clinic will continue to follow for insurance authorization, copayment issues, initial counseling and start date.  Leron Croak, PharmD, BCPS Hematology/Oncology Clinical Pharmacist [facility] and [place] [phone] 08/10/2021 4:08 PM

## 2021-08-10 NOTE — Addendum Note (Signed)
Addended by: Sullivan Lone on: 08/10/2021 04:06 PM   Modules accepted: Orders

## 2021-08-11 ENCOUNTER — Other Ambulatory Visit (HOSPITAL_COMMUNITY): Payer: Self-pay

## 2021-08-12 ENCOUNTER — Other Ambulatory Visit (HOSPITAL_COMMUNITY): Payer: Self-pay

## 2021-08-12 NOTE — Telephone Encounter (Signed)
Oral Oncology Patient Advocate Encounter  Prior Authorization for Cometriq has been approved.    PA# [id number] Effective dates: 08/10/21 through 08/10/22  Patients co-pay is $2965  Oral Oncology Clinic will continue to follow.   [place] Patient [place] Phone [phone] Fax [phone] 08/12/2021 10:48 AM

## 2021-08-13 ENCOUNTER — Telehealth: Payer: Self-pay

## 2021-08-13 NOTE — Telephone Encounter (Addendum)
Oral Oncology Patient Advocate Encounter  Met patient in lobby to sign authorization form for enrollment in the EASE ([employer]) program in an effort to reduce the patient's out of pocket expense for Cometriq to $0.    Enrollment form and signed patient authorization form completed and faxed to [phone] on 08/13/21.   EASE phone number for follow up is [phone].     [facility] Patient Advocate [facility] Phone [phone] Fax [phone] 08/13/2021 9:27 AM   

## 2021-08-17 ENCOUNTER — Inpatient Hospital Stay: Payer: Medicare Other

## 2021-08-17 ENCOUNTER — Other Ambulatory Visit: Payer: Self-pay

## 2021-08-17 ENCOUNTER — Inpatient Hospital Stay: Payer: Medicare Other | Admitting: Dietician

## 2021-08-17 VITALS — BP 120/57 | HR 101 | Temp 97.8°F | Resp 16

## 2021-08-17 DIAGNOSIS — C73 Malignant neoplasm of thyroid gland: Secondary | ICD-10-CM

## 2021-08-17 DIAGNOSIS — R197 Diarrhea, unspecified: Secondary | ICD-10-CM

## 2021-08-17 DIAGNOSIS — C7951 Secondary malignant neoplasm of bone: Secondary | ICD-10-CM

## 2021-08-17 MED ORDER — ZOLEDRONIC ACID 4 MG/100ML IV SOLN
4.0000 mg | Freq: Once | INTRAVENOUS | Status: AC
  Administered 2021-08-17: 4 mg via INTRAVENOUS
  Filled 2021-08-17: qty 100

## 2021-08-17 MED ORDER — SODIUM CHLORIDE 0.9 % IV SOLN: Freq: Once | INTRAVENOUS | Status: AC

## 2021-08-17 NOTE — Patient Instructions (Signed)
Zoledronic Acid Injection (Hypercalcemia, Oncology) What is this medication? ZOLEDRONIC ACID (ZOE le dron ik AS id) slows calcium loss from bones. It high calcium levels in the blood from some kinds of cancer. It may be used in other people at risk for bone loss. This medicine may be used for other purposes; ask your health care provider or pharmacist if you have questions. COMMON BRAND NAME(S): Zometa What should I tell my care team before I take this medication? They need to know if you have any of these conditions: cancer dehydration dental disease kidney disease liver disease low levels of calcium in the blood lung or breathing disease (asthma) receiving steroids like dexamethasone or prednisone an unusual or allergic reaction to zoledronic acid, other medicines, foods, dyes, or preservatives pregnant or trying to get pregnant breast-feeding How should I use this medication? This drug is injected into a vein. It is given by a health care provider in a hospital or clinic setting. Talk to your health care provider about the use of this drug in children. Special care may be needed. Overdosage: If you think you have taken too much of this medicine contact a poison control center or emergency room at once. NOTE: This medicine is only for you. Do not share this medicine with others. What if I miss a dose? Keep appointments for follow-up doses. It is important not to miss your dose. Call your health care provider if you are unable to keep an appointment. What may interact with this medication? certain antibiotics given by injection NSAIDs, medicines for pain and inflammation, like ibuprofen or naproxen some diuretics like bumetanide, furosemide teriparatide thalidomide This list may not describe all possible interactions. Give your health care provider a list of all the medicines, herbs, non-prescription drugs, or dietary supplements you use. Also tell them if you smoke, drink alcohol, or  use illegal drugs. Some items may interact with your medicine. What should I watch for while using this medication? Visit your health care provider for regular checks on your progress. It may be some time before you see the benefit from this drug. Some people who take this drug have severe bone, joint, or muscle pain. This drug may also increase your risk for jaw problems or a broken thigh bone. Tell your health care provider right away if you have severe pain in your jaw, bones, joints, or muscles. Tell you health care provider if you have any pain that does not go away or that gets worse. Tell your dentist and dental surgeon that you are taking this drug. You should not have major dental surgery while on this drug. See your dentist to have a dental exam and fix any dental problems before starting this drug. Take good care of your teeth while on this drug. Make sure you see your dentist for regular follow-up appointments. You should make sure you get enough calcium and vitamin D while you are taking this drug. Discuss the foods you eat and the vitamins you take with your health care provider. Check with your health care provider if you have severe diarrhea, nausea, and vomiting, or if you sweat a lot. The loss of too much body fluid may make it dangerous for you to take this drug. You may need blood work done while you are taking this drug. Do not become pregnant while taking this drug. Women should inform their health care provider if they wish to become pregnant or think they might be pregnant. There is potential for serious  harm to an unborn child. Talk to your health care provider for more information. What side effects may I notice from receiving this medication? Side effects that you should report to your doctor or health care provider as soon as possible: allergic reactions (skin rash, itching or hives; swelling of the face, lips, or tongue) bone pain infection (fever, chills, cough, sore  throat, pain or trouble passing urine) jaw pain, especially after dental work joint pain kidney injury (trouble passing urine or change in the amount of urine) low blood pressure (dizziness; feeling faint or lightheaded, falls; unusually weak or tired) low calcium levels (fast heartbeat; muscle cramps or pain; pain, tingling, or numbness in the hands or feet; seizures) low magnesium levels (fast, irregular heartbeat; muscle cramp or pain; muscle weakness; tremors; seizures) low red blood cell counts (trouble breathing; feeling faint; lightheaded, falls; unusually weak or tired) muscle pain redness, blistering, peeling, or loosening of the skin, including inside the mouth severe diarrhea swelling of the ankles, feet, hands trouble breathing Side effects that usually do not require medical attention (report to your doctor or health care provider if they continue or are bothersome): anxious constipation coughing depressed mood eye irritation, itching, or pain fever general ill feeling or flu-like symptoms nausea pain, redness, or irritation at site where injected trouble sleeping This list may not describe all possible side effects. Call your doctor for medical advice about side effects. You may report side effects to FDA at 1-800-FDA-1088. Where should I keep my medication? This drug is given in a hospital or clinic. It will not be stored at home. NOTE: This sheet is a summary. It may not cover all possible information. If you have questions about this medicine, talk to your doctor, pharmacist, or  health care provider.  2022 Elsevier/Gold Standard (2019-08-01 09:13:00) Cabozantinib capsules and tablets What is this medication? CABOZANTINIB (KA boe ZAN ti nib) targets proteins in cancer cells and stops the cancer cell from growing. The capsules are used to treat thyroid cancer; the tablets are used to treat hepatocellular cancer, renal cell cancer, and thyroid cancer. This medicine  may be used for other purposes; ask your health care provider or pharmacist if you have questions. COMMON BRAND NAME(S): Cabometyx, COMETRIQ What should I tell my care team before I take this medication? They need to know if you have any of these conditions: Addison's disease bleeding disorder blood clots high blood pressure liver disease low adrenal gland function low levels of calcium in the blood protein in your urine recent surgery skin conditions or sensitivity thyroid disease an unusual or allergic reaction to cabozantinib, other medicines, foods, dyes, or preservatives pregnant or trying to get pregnant breast-feeding How should I use this medication? Take this medicine by mouth with water. Take it as directed on the prescription label at the same time every day. Do not cut, crush, or chew this medicine. Swallow the tablets or capsules whole. Take it on an empty stomach, at least 1 hour before and 2 hours after food. Keep taking it unless your health care provider tells you to stop. Do not take this medicine with grapefruit juice. Talk to your health care provider about the use of this medicine in children. While it may be prescribed for children as young as 12 for selected conditions, precautions do apply. Overdosage: If you think you have taken too much of this medicine contact a poison control center or emergency room at once. NOTE: This medicine is only for you. Do not share  this medicine with others. What if I miss a dose? If you miss a dose, take it as soon as you can unless it is more than 12 hours late. If it is more than 12 hours late, skip the missed dose. Take the next dose at the normal time. What may interact with this medication? This medicine may interact with the following medications: certain antibiotics like clarithromycin or telithromycin certain antivirals for HIV or hepatitis certain medicines for fungal infections like ketoconazole, itraconazole, or  posaconazole certain medicines for seizures like carbamazepine, phenobarbital, or phenytoin conivaptan grapefruit juice rifabutin rifampin rifapentine St. John's Wort This list may not describe all possible interactions. Give your health care provider a list of all the medicines, herbs, non-prescription drugs, or dietary supplements you use. Also tell them if you smoke, drink alcohol, or use illegal drugs. Some items may interact with your medicine. What should I watch for while using this medication? This drug may make you feel generally unwell. This is not uncommon, as chemotherapy can affect healthy cells as well as cancer cells. Report any side effects. Continue your course of treatment even though you feel ill unless your health care provider tells you to stop. Before having surgery or dental work, talk to your health care provider to make sure it is ok. This drug can increase the risk of poor healing of your surgical site or wound. You will need to stop this drug for 3 weeks before surgery or invasive dental procedures. After surgery, wait at least 2 weeks before restarting this drug. Make sure the surgical site or wound is healed enough before restarting this drug. Talk to your health care provider if questions. Do not become pregnant while taking this medicine or for 4 months after stopping it. Women should inform their health care provider if they wish to become pregnant or think they might be pregnant. There is potential for serious harm to an unborn child. Talk to your health care provider for more information. Do not breast-feed an infant while taking this medicine or for 4 months after stopping it. This medicine may make it more difficult to get pregnant or father a child. Talk to your health care provider if you are concerned about your fertility. This medicine may increase your risk to bruise or bleed. Call your health care provider if you notice any unusual bleeding. Be careful  brushing or flossing your teeth or using a toothpick because you may get an infection or bleed more easily. If you have any dental work done, tell your dentist you are receiving this medicine. This medicine may increase your risk for jaw problems. Tell your health care provider right away if you have severe pain in your jaw. Tell your health care provider if you have any pain that does not go away or that gets worse. This medicine may increase your risk of getting an infection. Call your health care provider for advice if you get a fever, chills, sore throat, or other symptoms of a cold or flu. Do not treat yourself. Try to avoid being around people who are sick. Avoid taking medicines that contain aspirin, acetaminophen, ibuprofen, naproxen, or ketoprofen unless instructed by your health care provider. These medicines may hide a fever. What side effects may I notice from receiving this medication? Side effects that you should report to your doctor or health care professional as soon as possible: allergic reactions like skin rash, itching or hives, swelling of the face, lips, or tongue bleeding (bloody or  black, tarry stools; red or dark brown urine; spitting up blood or brown material that looks like coffee grounds; red spots on the skin; unusual bruising or bleeding from the eyes, gums, or nose) blood clot (chest pain; shortness of breath; pain, swelling, or warmth in the leg) increase in blood pressure infection (fever, chills, cough, sore throat, pain or trouble passing urine) jaw pain, especially after dental work light colored stool liver injury (dark yellow or brown urine; general ill feeling or flu-like symptoms; loss of appetite, right upper belly pain; unusually weak or tired; yellowing of the eyes or skin) low adrenal gland function (nausea; vomiting; loss of appetite; unusually weak or tired; dizziness; low blood pressure) low calcium levels (fast heartbeat; muscle cramps or pain; pain,  tingling, or numbness in the hands or feet; seizures) redness, blistering, peeling, or swelling of the skin on the palms of your hands or soles of your feet stomach pain stroke (changes in vision; confusion; trouble speaking or understanding; severe headaches; sudden numbness or weakness of the face, arm, or leg; trouble walking; dizziness; loss of balance or coordination) Side effects that usually do not require medical attention (report to your doctor or health care professional if they continue or are bothersome): change in taste diarrhea mouth sores weight loss This list may not describe all possible side effects. Call your doctor for medical advice about side effects. You may report side effects to FDA at 1-800-FDA-1088. Where should I keep my medication? Keep out of the reach of children and pets. Store between 20 and 25 degrees C (68 and 77 degrees F). Get rid of any unused medicine after the expiration date. To get rid of medicines that are no longer needed or have expired: Take the medicine to a medicine take-back program. Check with your pharmacy or law enforcement to find a location. If you cannot return the medicine, check the label or package insert to see if the medicine should be thrown out in the garbage or flushed down the toilet. If you are not sure, ask your health care provider. If it is safe to put in the trash, take the medicine out of the container. Mix the medicine with cat litter, dirt, coffee grounds, or other unwanted substance. Seal the mixture in a bag or container. Put it in the trash. NOTE: This sheet is a summary. It may not cover all possible information. If you have questions about this medicine, talk to your doctor, pharmacist, or health care provider.  2022 Elsevier/Gold Standard (2020-08-03 14:27:07)

## 2021-08-17 NOTE — Telephone Encounter (Signed)
Oral Chemotherapy Pharmacist Encounter   Attempted to reach patient to provide update and offer for initial counseling on oral medication: Cometriq (cabozantinib).   No answer. Left voicemail for patient to call back to discuss details of medication acquisition and initial counseling session.  Leron Croak, PharmD, BCPS Hematology/Oncology Clinical Pharmacist [facility] and [place] [phone] 08/17/2021 2:03 PM

## 2021-08-17 NOTE — Telephone Encounter (Signed)
Patient is approved for Cometriq at no cost from [employer] 08/13/21-10/30/21  [employer]ase uses [place] Patient [place] Phone [phone] Fax [phone] 08/17/2021 9:54 AM

## 2021-08-18 ENCOUNTER — Telehealth: Payer: Self-pay

## 2021-08-18 ENCOUNTER — Encounter: Payer: Self-pay | Admitting: Hematology

## 2021-08-18 NOTE — Progress Notes (Signed)
Nutrition Follow-up:  Patient receiving cabozantinib and zometa for symptomatic metastatic medullary thyroid cancer.   Met with patient during infusion. She reports eating small amounts orally. Yesterday she had an egg with cheese for breakfast, 1/2 ham, cheese, mayo sandwich for lunch, baked potato with cheese, butter for dinner. Patient continues giving 3 Osmolite 1.5 via tube/day. She is drinking 2-3 chocolate Boost Plus, she does not care for other flavors. Patient reports having severe heartburn which is worse in the morning. She is thinking about buying a wedge pillow. Patient is going to try taking PPI and sucralfate per MD.   Medications: sucralfate, imodium, protonix  Labs: 10/4 labs reviewed  Anthropometrics: Last weight 92 lb 12.8 oz on 10/4 stable. Patient weighed 92 lbs on 9/19  5/11 - 91 lb 6.4 oz   NUTRITION DIAGNOSIS:  Severe malnutrition improving   INTERVENTION:  Continue 1 carton Osmolite 1.5 via tube 3x/day with 60 ml water before and after each feeding Encouraged eating high calorie, high protein foods orally as tolerated Continue 2-3 drinking Boost Plus/equivalent/day, suggested patient try drinking vanilla vs chocolate to aid with reported indigestion - Boost coupons provided Made milkshake with orange sherbet mixed with vanilla Ensure which patient liked, she is adding sherbet to her grocery list  Contact information provided    MONITORING, EVALUATION, GOAL: weight trends   NEXT VISIT: Tuesday December 13 during infusion

## 2021-08-18 NOTE — Telephone Encounter (Signed)
Oral Chemotherapy Pharmacist Encounter   2nd attempt to reach patient to provide update and offer for initial counseling on oral medication: Cometriq (cabozantinib).   No answer. Left voicemail for patient to call back to discuss details of medication acquisition and initial counseling session.  Leron Croak, PharmD, BCPS Hematology/Oncology Clinical Pharmacist [facility] and [place] [phone] 08/18/2021 9:48 AM

## 2021-08-18 NOTE — Telephone Encounter (Signed)
Pt called in and stated she was returning a call to the [facility]. Pt was unsure who called her. Pt states she had a Zometa infusion yesterday and is doing well today. Pt denies having any side effects today.

## 2021-08-19 LAB — CALCITONIN: Calcitonin: 62744 pg/mL — ABNORMAL HIGH (ref 0.0–5.0)

## 2021-08-19 NOTE — Telephone Encounter (Signed)
Oral Chemotherapy Pharmacist Encounter   3rd attempt to reach patient to provide update and offer for initial counseling on oral medication: Cometriq (cabozantinib).   No answer. Left voicemail for patient to call back to discuss details of medication acquisition and initial counseling session. Direct phone number to oral chemotherapy clinic provided for call back.   Leron Croak, PharmD, BCPS Hematology/Oncology Clinical Pharmacist [facility] and [place] [phone] 08/19/2021 2:03 PM

## 2021-08-20 NOTE — Telephone Encounter (Signed)
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  Counseled patient on administration, dosing, side effects, monitoring, drug-food interactions, safe handling, storage, and disposal.  Patient will take Cometriq 20mg  capsules, 3 capsules (60mg  total daily dose) by mouth once daily on an empty stomach, 1 hour before or 2 hours after a meal.  Patient knows to avoid grapefruit and grapefruit juice.  Cometriq start date: 08/23/21  Adverse effects include but are not limited to: diarrhea, nausea, decreased appetite, fatigue, hypertension, hand-foot syndrome, decreased blood counts, and electrolyte abnormalities.  Patient will obtain anti diarrheal and alert the office of 4 or more loose stools above baseline.  Patient informed that Cometriq should be held at least 3 weeks prior to any scheduled surgery (including dental surgery) and not resumed until at least 2 weeks after major surgery and until adequate wound healing is established.  Reviewed with patient importance of keeping a medication schedule and plan for any missed doses. No barriers to medication adherence identified.  Medication reconciliation performed and medication/allergy list updated.  Insurance authorization for Cometriq has been obtained. Patient has been approved to receive the medication at no cost through manufacturer assistance.   All questions answered.  Ms. Griffitts voiced understanding and appreciation.   Medication education handout placed in mail for patient. Patient knows to call the office with questions or concerns. Oral Chemotherapy Clinic phone number provided to patient.   Leron Croak, PharmD, BCPS Hematology/Oncology Clinical Pharmacist [facility] and [place] [phone] 08/20/2021 3:13 PM

## 2021-08-30 ENCOUNTER — Other Ambulatory Visit: Payer: Self-pay

## 2021-08-30 DIAGNOSIS — C7951 Secondary malignant neoplasm of bone: Secondary | ICD-10-CM

## 2021-08-31 ENCOUNTER — Telehealth: Payer: Self-pay

## 2021-08-31 ENCOUNTER — Inpatient Hospital Stay: Payer: Medicare Other | Attending: Hematology

## 2021-08-31 ENCOUNTER — Other Ambulatory Visit: Payer: Self-pay | Admitting: Pharmacist

## 2021-08-31 ENCOUNTER — Other Ambulatory Visit: Payer: Self-pay

## 2021-08-31 ENCOUNTER — Inpatient Hospital Stay: Payer: Medicare Other | Admitting: Hematology

## 2021-08-31 VITALS — BP 156/80 | HR 95 | Temp 98.1°F | Resp 17 | Ht 63.0 in | Wt 94.9 lb

## 2021-08-31 DIAGNOSIS — C73 Malignant neoplasm of thyroid gland: Secondary | ICD-10-CM

## 2021-08-31 DIAGNOSIS — E039 Hypothyroidism, unspecified: Secondary | ICD-10-CM | POA: Insufficient documentation

## 2021-08-31 DIAGNOSIS — M810 Age-related osteoporosis without current pathological fracture: Secondary | ICD-10-CM | POA: Diagnosis not present

## 2021-08-31 DIAGNOSIS — C7800 Secondary malignant neoplasm of unspecified lung: Secondary | ICD-10-CM | POA: Insufficient documentation

## 2021-08-31 DIAGNOSIS — C7951 Secondary malignant neoplasm of bone: Secondary | ICD-10-CM | POA: Diagnosis not present

## 2021-08-31 LAB — CMP (CANCER CENTER ONLY)
ALT: 37 U/L (ref 0–44)
AST: 40 U/L (ref 15–41)
Albumin: 3.6 g/dL (ref 3.5–5.0)
Alkaline Phosphatase: 114 U/L (ref 38–126)
Anion gap: 6 (ref 5–15)
BUN: 18 mg/dL (ref 8–23)
CO2: 30 mmol/L (ref 22–32)
Calcium: 8.2 mg/dL — ABNORMAL LOW (ref 8.9–10.3)
Chloride: 103 mmol/L (ref 98–111)
Creatinine: 0.66 mg/dL (ref 0.44–1.00)
GFR, Estimated: >60 mL/min (ref >60)
Glucose, Bld: 95 mg/dL (ref 70–99)
Potassium: 4.1 mmol/L (ref 3.5–5.1)
Sodium: 139 mmol/L (ref 135–145)
Total Bilirubin: 0.9 mg/dL (ref 0.3–1.2)
Total Protein: 6.5 g/dL (ref 6.5–8.1)

## 2021-08-31 LAB — CBC WITH DIFFERENTIAL (CANCER CENTER ONLY)
Abs Immature Granulocytes: 0.02 10*3/uL (ref 0.00–0.07)
Basophils Absolute: 0.1 10*3/uL (ref 0.0–0.1)
Basophils Relative: 1 %
Eosinophils Absolute: 0.5 10*3/uL (ref 0.0–0.5)
Eosinophils Relative: 8 %
HCT: 45.4 % (ref 36.0–46.0)
Hemoglobin: 15 g/dL (ref 12.0–15.0)
Immature Granulocytes: 0 %
Lymphocytes Relative: 12 %
Lymphs Abs: 0.7 10*3/uL (ref 0.7–4.0)
MCH: 27.3 pg (ref 26.0–34.0)
MCHC: 33 g/dL (ref 30.0–36.0)
MCV: 82.7 fL (ref 80.0–100.0)
Monocytes Absolute: 0.4 10*3/uL (ref 0.1–1.0)
Monocytes Relative: 7 %
Neutro Abs: 4.2 10*3/uL (ref 1.7–7.7)
Neutrophils Relative %: 72 %
Platelet Count: 166 10*3/uL (ref 150–400)
RBC: 5.49 MIL/uL — ABNORMAL HIGH (ref 3.87–5.11)
RDW: 13.1 % (ref 11.5–15.5)
WBC Count: 5.8 10*3/uL (ref 4.0–10.5)
nRBC: 0 % (ref 0.0–0.2)

## 2021-08-31 LAB — CEA (IN HOUSE-CHCC): CEA (CHCC-In House): 150.99 ng/mL — ABNORMAL HIGH (ref 0.00–5.00)

## 2021-08-31 MED ORDER — CABOZANTINIB S-MALATE 3 X 20 MG PO KIT: 60.0000 mg | PACK | Freq: Every day | ORAL | 1 refills | Status: DC

## 2021-08-31 MED ORDER — CABOZANTINIB S-MALATE 3 X 20 MG PO KIT
60.0000 mg | PACK | Freq: Every day | ORAL | 1 refills | Status: DC
  Filled 2021-08-31: qty 84, 84d supply, fill #0

## 2021-08-31 NOTE — Progress Notes (Signed)
Oral Oncology Pharmacist Encounter  Prescription refill for Cometriq sent to [facility] in error. Patient enrolled in manufacturer assistance and receives medication through [employer]. Prescription redirected to [facility] by [employer].  Leron Croak, PharmD, BCPS Hematology/Oncology Clinical Pharmacist [facility] [phone] 08/31/2021 4:24 PM

## 2021-08-31 NOTE — Telephone Encounter (Addendum)
Oral Oncology Patient Advocate Encounter  Met patient in lobby to sign authorization form for re-enrollment in the [employer]) program in an effort to reduce the patient's out of pocket expense for Cometriq to $0.    Enrollment form and signed patient authorization form completed and faxed to [phone] on 08/31/21.   EASE phone number for follow up is [phone].   This encounter will be updated until final determination.  [name] Patient [place] Phone [phone] Fax [phone] 08/31/2021 12:06 PM

## 2021-09-01 ENCOUNTER — Other Ambulatory Visit (HOSPITAL_COMMUNITY): Payer: Self-pay

## 2021-09-01 ENCOUNTER — Telehealth: Payer: Self-pay | Admitting: Hematology

## 2021-09-01 MED ORDER — CABOZANTINIB S-MALATE 3 X 20 MG PO KIT: 60.0000 mg | PACK | Freq: Every day | ORAL | 1 refills | Status: DC

## 2021-09-01 NOTE — Telephone Encounter (Signed)
Left message with follow-up appointments per 11/1 los.

## 2021-09-01 NOTE — Addendum Note (Signed)
Addended byBritt Boozer on: 09/01/2021 08:39 AM   Modules accepted: Orders

## 2021-09-06 ENCOUNTER — Encounter: Payer: Self-pay | Admitting: Hematology

## 2021-09-06 NOTE — Progress Notes (Addendum)
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 total thyroidectomy in January of 2011 and bilateral neck dissection in June of 2011.  B. Persistent hypercalcitoninemia postoperatively. Calcitonin 499 in 07/2011  C. December 2012: CT of neck/chest was negative for metastases D. She had a normal neck ultrasound in 01/2012 showing her cystic LN was smaller (the one biopsied and negative).  E. FDG PET 08/2012 due to her last calcitonin raising substantially to 739 from the 500s: concerning right cervical lymph node F. 11/08/2012: Level 6 LN dissection. She had 2/2 LN positive, largest 2cm for MTC. Calcitonin one week later from her surgery was down to 515.  G. Calcitonin 592 on 05/24/2013. Repeat ultrasound on that same day which showed 2 at least partially cystic lymph nodes at level 4 which had been previously biopsied and negative; decreased in size since 2012. It also showed a subcentimeter right level VB LN of low suspicion. H. Her metanephrines (plasma) and PTH were normal in 12/2013 Urine studies for pheo were negative in 12/2013. I. 08/04/2014: CEA 9.0; calcitonin 1019 J. 09/05/2014:  - C/A/P CT scan: Interval development of a new calcified nodule in the left lower lobe. - Bone scan: Slight asymmetric increased radiotracer uptake in the right lateral calvarium on the frontal radiograph - Neck U/S: Stable right lateral compartment level 4 and 5B lymph nodes. The cystic right level 4 lymph node, previously biopsied on 01/14/2011 with negative pathology results.  K. 09/23/2014: Thoracoscopy: level 9 LN: Metastatic medullary thyroid carcinoma.  L.  03/11/2015: CEA 15.9; calcitonin 1149 M 04-13-2015:  - C/A/P CT scan: Previously reported left lower lobe pulmonary nodule is no longer identified. No new or enlarging pulmonary nodules. No evidence of metastatic disease in the abdomen or pelvis. - CT brain:No acute intracranial abnormalities.   New small CSF density subdural fluid collection overlying the right cerebral hemisphere, likely a small hygroma of uncertain etiology. There is no associated bony or parenchymal lesion. 04-13-2015: Bone scan: A non-specific, very small focus of increased tracer activity in the right frontal calvarium is not significantly changed from the prior study. - Neck U/S: There is hypoechoic lesion with questionable microcalcification located in right level IV, it is 0.4 x 0.6 x 0.9 cm. No other pathologic cervical lymphadenopathy seen.  N. 04/13/2015: CEA 14.6; calcitonin 1324 O. 07/16/2015 ([facility]): CEA 19.4 (outsode of [facility]); CAlcitonin 1860. P. 09-07-2015: Neck, Chest , Abd, [place] CT: Stable post-thyroidectomy changes without evidence of recurrent or metastatic disease. Stable pulmonary nodules. Hepatic cysts. No bone mets. CEA: 16; Calcitonin 1533 Q. 03-08-2016: C/A/P CT scan: No definite evidence of metastatic disease in the chest, abdomen or pelvis. Compared to 09/07/2015, there is a new linear 3 mm right lower lobe pulmonary nodule which is indeterminate and may represent focal atelectasis CEA 16.5; CAlcitonin 1839  R. 08-22-2016: Calcitonin: 4596; CEA 27.1  S. 09-06-2016:  - Bone scan: Redemonstrated likely two foci of increased radiotracer uptake within the right calvarium, similar to prior study. - Neck CT: No evidence of recurrent or metastatic disease in the neck. - C/A/P CT: No definite evidence of metastatic disease in the chest, abdomen, or pelvis. - Neck CT: Status  post thyroidectomy and lymph node dissection without evidence of recurrent or metastatic disease.  T. Bone scan: A focus of increased tracer  activity projecting over the posterior right approximate 8th rib is non-specific; this finding may relate to summation of tracer activity in the rib with the inferior tip of the overlying right scapula, post-traumatic change or skeletal metastasis; recommend attention on follow-up. Re-demonstration of subtly increased tracer activity in the right frontal calvarium, stable since 09/05/2014. U. 02-13-2017:  - C/A/P CT scan: nonenlarged mediastinal lymph nodes and soft tissue nodule in the right thyroidectomy bed. Unchanged irregular opacities in the lung apices bilaterally, which may represent treated metastases or scarring.  - Neck CT: Numerous soft tissue enhancing lesions in the superior mediastinum. While these are stable when compared to most recent CT, several of these lesions demonstrate minimal growth from the more remote exam on 09/07/2015. No new enhancing lesions are present. - CEA 25.1, Calcitonin: 5498 V. 08-28-2017: fractionate plasma free metanephrines: Normetanephrine 178 (24hr urine pending as this was thought to be non-specific), Metanephrine: 44 (normal). CEA 46; Calcitonin 9763 W. 09-27-2017: Bone scan: Stable increased radiotracer activity in the right frontal calvarium, indeterminate. No other evidence of osseous metastasis Chest CT/Abd: Mildly enlarged mediastinal lymph nodes are stable. No evidence of metastatic disease in the abdomen   HISTORY OF PRESENTING ILLNESS:   Shannon [name] is a wonderful 80 y.o. female who has been referred to [place] by [place] oncologist Dr. Leslie Andrea for management of Metastatic Medullary Thyroid Cancer. She is looking for a Financial controller in [place].  Today she is accompanied by her sister. Patient notes this started with a nodule in her throat and her endocrinologist started further work up. She has been seeing Med onc at [facility] since her first surgery every 3-6 months.   She has had thyroid surgery and Ldissection but no systemic  therapy as detailed above in the oncologic history.  She notes her family had testing for MEN2A mutation. Her sister, her nephew carry the gene. She notes her maternal aunt had goiters but not sure of the cause.   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 She has had diarrhea since 2010. Her last colonoscopy was in 2010. She has tried multiple treatment options but this continues to worsen over time. She currently takes imodium 4-6 X daily, cholestyramine twice daily. She started monthly Sandostatin on 10/20/17. She fell and broke her hip on 10/23/17 and had surgery same day. She was given hydrocodone which helped her diarrhea and is not sure if the Sandostatin is working for her. She has been on oral iron and she is on aspirin 317m since her hip surgery. She does PT exercises and has been healing well.    She recently stopped caffeine tablets that she was taking for energy. She stopped 3 months ago. She is on Reclast for her osteoporosis, which is managed by her PCP, Dr. KDeatra Ina Pt notes she stopped smoking about 40 years ago and she rarely drinks alcohol.   On review of symptoms, pt notes continued diarrhea, stable energy, weight loss of 6-8 pounds in the last 6 months. Her appetite is adequate. Her diarrhea occurs currently twice a day with "pudding" form to liquid form stool. She can go up to 5-7 times a day at times. She denies cramping with diarrhea. She denies blood, and mucus in stool. Current use of oral iron causes some black stool, which was not present before use.  INTERVAL HISTORY:  Nica Friske returns today regarding her Metastatic Medullary Thyroid Cancer. The patient's last visit with [place] was on 08/03/2021  Patient notes her fatigue and acid reflux are much better since she started the PPI and the sucralfate.  She notes she is tolerating the Cometriq dose well and is feeling better than she has felt in the last 2 to 3 years.  She has no new acute  symptoms.  Notes improved p.o. intake.  She is very glad that she is feeling much better compared to her last clinic visit.  Labs today showed normal CBC.  CMP is within normal limits as well.  She has currently been on the Cometriq for about a week and reports no significant toxicities.  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 DUCTAL CARCINOMA IN SITU./ RADIATION   CATARACT EXTRACTION W/ INTRAOCULAR LENS IMPLANT  2009   BOTH EYES   FEMUR IM NAIL Left 10/23/2017   Procedure: INTRAMEDULLARY (IM) NAIL FEMORAL;  Surgeon: Paralee Cancel, MD;  Location: [place];  Service: Orthopedics;  Laterality: Left;   FOOT SURGERY  1070   RIGHT FOOT - PINCHED NERVE   IR GASTROSTOMY TUBE MOD SED  01/13/2021   LUNG SURGERY     CALCIFICATIONS, POSITIVE - CANCER LYMPHNODE    MELANOMA EXCISION  2008   CHEST AREA   NECK SURGERY  Nov 09 2011   LYMPHNODES REMOVED 2 POSITIVE    RIGHT COLECTOMY  06/02/2009   THYROIDECTOMY  11/06/2009   TONSILLECTOMY AND ADENOIDECTOMY      SOCIAL HISTORY: Social History   Socioeconomic History   Marital status: Married    Spouse name: Not on file   Number of children: Not on file   Years of education: Not on file   Highest education level: Not on file  Occupational History   Not on file  Tobacco Use   Smoking status: Former    Types: Cigarettes    Quit date: 08/10/1960    Years since quitting: 61.1   Smokeless tobacco: Never  Vaping Use   Vaping Use: Never used  Substance  and Sexual Activity   Alcohol use: No    Alcohol/week: 0.0 standard drinks   Drug use: No   Sexual activity: Never  Other Topics Concern   Not on file  Social History Narrative   Not on file   Social Determinants of Health   Financial Resource Strain: Not on file  Food Insecurity: Not on file  Transportation Needs: Not on file  Physical Activity: Not on file  Stress: Not on file  Social Connections: Not on file  Intimate Partner Violence: Not on file    FAMILY HISTORY: Family History  Problem Relation Age of Onset   Cancer Father        throat/ bone   Breast cancer Sister    Cancer Sister        MEDULAR  CANCER   Cancer Brother        prostate   Cancer Other        MEDULAR CANCER   Breast cancer Maternal Aunt    Cancer Paternal Aunt        pancreatic    Cancer Cousin        ON MOTHER'S SIDE- LIVER CANCER    ALLERGIES:  is allergic to codeine.  MEDICATIONS:  Current Outpatient Medications  Medication Sig Dispense Refill   ALPRAZolam (XANAX) 1 MG tablet Take 0.5 tablets (0.5 mg total) by mouth 2 (two) times daily. 6 tablet 0   bacitracin ophthalmic ointment      cabozantinib s-malate (COMETRIQ) capsule (3 x 51m kit) Take 60 mg by mouth daily. 1 kit 1   Calcium Carb-Cholecalciferol 600-200 MG-UNIT TABS 2 tablets     Calcium Carbonate-Vit D-Min (CALCIUM 600 + MINERALS PO) Take 1 tablet by mouth daily.     cetirizine (ZYRTEC) 10 MG tablet Take 10 mg by mouth daily.     Cholecalciferol (VITAMIN D) 1000 UNITS capsule Take 1,000 Units by mouth daily.     cholestyramine (QUESTRAN) 4 g packet DISSOLVE 1 PACKET IN LIQUID OF CHOICE AND DRINK 2 TIMES A DAY 60 packet 5   Cod Liver Oil 1000 MG CAPS Take 1,000 mg by mouth daily.     diphenhydramine-acetaminophen (TYLENOL PM EXTRA STRENGTH) 25-500 MG TABS tablet 1-2 tabs     Glucosamine-Chondroitin 250-200 MG CAPS Take 1 tablet by mouth daily. Name of table is SCHIFF     levothyroxine (SYNTHROID) 125 MCG tablet Take 1 tablet  (125 mcg total) by mouth daily before breakfast. 2 tablets on sundays and 1 tablet rest of the week 90 tablet 3   loperamide (IMODIUM A-D) 2 MG tablet Take 2 mg by mouth 3 (three) times daily.     Multiple Vitamin (MULTIVITAMIN) tablet Take 1 tablet by mouth daily.     ondansetron (ZOFRAN) 8 MG tablet Take 1 tablet by mouth every 8 hours as needed for nausea or vomiting. 30 tablet 0   pantoprazole (PROTONIX) 40 MG tablet Take 1 tablet (40 mg total) by mouth 2 (two) times daily before a meal. 60 tablet 1   simethicone (MYLICON) 80 MG chewable tablet Chew 80 mg by mouth 3 (three) times daily.  (Patient not taking: Reported on 07/19/2021)     sucralfate (CARAFATE) 1 g tablet Take 1 tablet (1 g total) by mouth 4 (four) times daily -  with meals and at bedtime. 420 tablet 0   vitamin E 180 MG (400 UNITS) capsule 1 tablet     zoledronic acid (RECLAST) 5 MG/100ML SOLN injection Inject 5 mg into the vein once.     No current facility-administered medications for this visit.    REVIEW OF SYSTEMS:   .10 Point review of Systems was done is negative except as noted above.   PHYSICAL EXAMINATION: ECOG PERFORMANCE STATUS: 1 - Symptomatic but completely ambulatory   Vitals:   08/31/21 1541  BP: (!) 156/80  Pulse: 95  Resp: 17  Temp: 98.1 F (36.7 C)  TempSrc: Temporal  SpO2: 98%  Weight: 94 lb 14.4 oz (43 kg)  Height: _0  (1.6 m)    . GENERAL:alert, in no acute distress and comfortable SKIN: no acute rashes, no significant lesions EYES: conjunctiva are pink and non-injected, sclera anicteric OROPHARYNX: MMM, no exudates, no oropharyngeal erythema or ulceration NECK: supple, no JVD LYMPH:  no palpable lymphadenopathy in the cervical, axillary  or inguinal regions LUNGS: clear to auscultation b/l with normal respiratory effort HEART: regular rate & rhythm ABDOMEN:  normoactive bowel sounds , non tender, not distended. Extremity: no pedal edema PSYCH: alert & oriented x 3 with fluent  speech NEURO: no focal motor/sensory deficits   LABORATORY DATA:  I have reviewed the data as listed  . CBC Latest Ref Rng & Units 08/31/2021 08/03/2021 02/03/2021  WBC 4.0 - 10.5 K/uL 5.8 5.2 5.5  Hemoglobin 12.0 - 15.0 g/dL 15.0 12.9 12.4  Hematocrit 36.0 - 46.0 % 45.4 39.8 38.0  Platelets 150 - 400 K/uL 166 221 255   . CMP Latest Ref Rng & Units 08/31/2021 08/03/2021 07/19/2021  Glucose 70 - 99 mg/dL 95 96 89  BUN 8 - 23 mg/dL _0 Creatinine 0.44 - 1.00 mg/dL 0.66 0.72 0.63  Sodium 135 - 145 mmol/L 139 140 139  Potassium 3.5 - 5.1 mmol/L 4.1 4.2 4.6  Chloride 98 - 111 mmol/L 103 101 100  CO2 22 - 32 mmol/L _1 Calcium 8.9 - 10.3 mg/dL 8.2(L) 9.6 10.2  Total Protein 6.5 - 8.1 g/dL 6.5 7.0 -  Total Bilirubin 0.3 - 1.2 mg/dL 0.9 0.7 -  Alkaline Phos 38 - 126 U/L 114 87 -  AST 15 - 41 U/L 40 20 -  ALT 0 - 44 U/L 37 14 -   02/12/18 Calcitonin:     PATHOLOGY  Lung Biopsy at [facility] 09/23/14 DIAGNOSIS A. Pleural nodule, biopsy:  Calcified and hyalinized remote fat necrosis. No evidence of malignancy.   B. Level 9 lymph node, biopsy:  Metastatic medullary thyroid carcinoma. See comment.  Comment:: A note is made of the patient's history of medullary thyroid carcinoma with documented prior metastatic disease to cervical lymph nodes ([id number]; 04/05/2010).  The prior metastases to cervical lymph nodes are reviewed in conjunction to the current case and the tumors are morphologically similar.   Confirmatory immunohistochemical stains are performed. Tumor cells stain positively for calcitonin and for an anticytokeratin cocktail. The morphologic and immunophenotypic findings support metastatic medullary thyroid carcinoma.   C. Level 8 lymph node, biopsy:  Profiles of lymph nodes, negative for malignancy.   D. Pleural nodule #2, biopsy:  Fibrovascular tissue with extensive cauterization. No evidence of malignancy.        Surgical Pathology at [facility]  11/08/12 Immunohistochemical Findings An immunohistochemical stain for calcitonin obtained on paraffin block A1 is positive.    Diagnosis A. "RIGHT LEVEL 6 LYMPH NODES" (BIOPSY):      METASTATIC MEDULLARY THYROID CARCINOMA IN TWO LYMPH NODES (2/2).     SIZE OF LARGEST METASTASIS:  2 CM.     EXTRANODAL INVASION:  PRESENT. Comment:  I certify that I personally conducted the diagnostic evaluation of the above  specimen(s) and have rendered the above diagnosis(es).                             Rex C. Telford Nab, M.D. [phone]                             Electronically signed: 11/16/12   A. "Right level 6 lymph nodes", received fresh and placed in formalin on 1/9/ two fragments of tan-red soft tissue.  Fragment 1 is 1.3 x 1 x 0.6 cm and is bisected and submitted in block A1.  Fragment 2 is 2 x 1 x 0.4 cm and is  submitted entirely in block A2.    RADIOGRAPHIC STUDIES: I have personally reviewed the radiological images as listed and agreed with the findings in the report. No results found.    CT Chest w Contrast w 3D MIPS Protocol at [facility] 09/29/17 Impression: 1.  Mildly enlarged mediastinal lymph nodes are stable when compared to most recent CT. Although, several nodes are minimally increased from 03/08/2016.  2.  Refer to separately dictated abdominal CT report for findings below the diaphragm.  Bone Scan Whole Body at [facility] 09/27/17 Impression: 1. Stable foci of increased radiotracer activity in the right frontal calvarium, indeterminate. 2. No new scintigraphic evidence of osseous metastasis.  US Lymph Node Neck Mapping at [facility] 08/29/17 Impression: 1.  Newly measured right level 5 lymph node (lymph node #5) with abnormal rounded morphology, no echogenic hilum, and microcalcifications. Although biopsy of this nodule may be technically difficult due to its location deep to the carotid vasculature, ultrasound-guided biopsy is recommended (if possible).  2.  Newly measured  right level 2A lymph node without suspicious features.  3.  Additional right neck nodes are unchanged.   Bone Density Scan 02/27/17 @ [facility]  Lowest Site Measured: Total Left hip with T-score of -3.1 and BMD of 0.568   CT CAP W Contrast W MIPS at [facility] 02/13/17 Impression: 1. Please see CT neck completed same day for findings in the neck. 2. Stable nonenlarged mediastinal lymph nodes and soft tissue nodule in the right thyroidectomy bed. 3. Unchanged irregular opacities in the lung apices bilaterally, which may represent treated metastases or scarring. No new metastatic disease in the chest, abdomen, or pelvis.  CT Nect Soft Tissue W Contrast at [facility] 02/13/17 IMPRESSION: Numerous soft tissue enhancing lesions in the superior mediastinum. While these are stable when compared to most recent CT, several of these lesions demonstrate minimal growth from the more remote exam on 09/07/2015. No new enhancing lesions are present.  ASSESSMENT & PLAN:   JANALYN HIGBY is a 80 y.o. caucasian female with   1. 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  She had a level 6 LN dissection on 11/08/12 with 2/2 LN positive, largest 2cm for MTC.  09/05/14 CAP CT with development of new calcified nodules in the left lower lobe 09/23/14 Lung biopsy showed Level 9 lymph node, positive for malignancy and metastatic cancer which was resected. Treatment for symptomatic disease with vandetanib and cabozantibnib were previously discussed with her 61 Oncologist but the patient had decided to hold off on treatment due to concerns for toxicities and lack of overt cancer related progressive symptoms. Latest scans from 08/2017 are stable, no disease found below chest or in the bones. Her calcitonin levels have been increasing since 2015. Overall pt  currently asymptomatic.   CT chest on 10/17/2018 which revealed "1. Unchanged mediastinal and right supraclavicular lymphadenopathy. Findings are nonspecific, however metastatic disease is not excluded. 2. Stable biapical scarring and right apical consolidative focus with associated calcification, likely postinfectious in nature."  10/11/2018 CT angiography chest w contrast revealed "1. No demonstrable pulmonary embolus. No thoracic aortic aneurysm or dissection. There are foci of aortic atherosclerosis as well as great vessel and coronary artery calcifications. 2. Fibrosis and cicatrization in the apices with apical pleural calcification, likely due to scarring. There is focal airspace opacity which is asymmetric in the apical segment of the right upper lobe.  This finding may well be due to chronic scarring. A potential focus of pneumonia or possible neoplastic involvement in this area must be of concern. This finding may well warrant correlation with nuclear medicine PET study to assess for abnormal metabolic activity. At a minimum, a follow-up chest CT in 3 months to assess for stability of this area would be advised. No other airspace consolidation noted. Areas of patchy atelectasis noted. No well-defined nodular type lesions noted in the lung parenchyma. 3. Probable sclerotic bony metastases at C3 and L1. Sclerotic focus at L1 is incompletely visualized. Medullary thyroid carcinoma as well as breast carcinoma potentially may present with sclerotic as opposed to lytic metastases. 4.  No demonstrable thoracic adenopathy. 5. Extensive postoperative change in the thyroid region with evidence of total thyroidectomy. 6. Mild reflux into the inferior vena cava and hepatic veins suggests increase in right heart pressure. Aortic Atherosclerosis (ICD10-I70.0)."  12/06/2019 CT C/A/P ([id number]) ([id number]) revealed "1. Stable exam. No new or progressive findings to suggest progressive metastatic disease. 2.  Scattered sclerotic bone lesions, similar to prior. 3. Stable mild intrahepatic and extrahepatic biliary duct dilatation. 4. Trace free fluid in the cul-de-sac."  12/06/2019 CT Soft Tissue Neck ([id number]) revealed "1. Thyroidectomy with nodal metastases at the thoracic inlet/anterior compartment and right supraclavicular fossa. There are no prior neck CT comparison is available for review. 2. Spinal osseous metastatic disease without visible extraosseous tumor extension."  06/22/2020 CT C/A/P ([id number]) ([id number]) revealed "1. Borderline enlarged supraclavicular and mediastinal lymph nodes, stable. 2. Osseous metastatic disease, stable. 3. Left lower lobe aspiration and trace left pleural fluid. 4. Hepatomegaly. 5. Aortic atherosclerosis."  2. 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 -Secondary to hypercalcitoninemia  -Currently on imodium daily, cholestyramine twice daily  -was previously on monthly Sandostatin injections in 10/20/17- but patient decided to hold citing no real benefit from adding this -Her last colonoscopy was in 2010  PLAN:  -Discussed pt labwork today, 08/31/2021 CBC within normal limits, CMP within normal limits, -Continue cholestyramine and as needed Lomotil for diarrhea -PPI and sucralfate for severe heartburns  -Continue management of hypothyroidism as per endocrinology. - starting Zometa q5month instead of Reclast qyearly due to known bone tumors.  -Patient notes significantly improved fatigue and no significant toxicities after starting Cometriq at 60 mg p.o. daily over the last week -Recommended pt take 2000 IU Vitamin D daily. -Recommended pt continue to f/u w BErnestene Kielfor active management of food intake and tube feeding needs.  3. Osteoporosis - Last DEXA 02/27/17 with Lowest Site Measured: Total Left hip with T-score of -3.1 and BMD of 0.568 Closed left hip fracture from fall -s/p surgery on 10/23/17 with rod placement -  healing well.   4. Chronic oropharyngeal Dysphagia ? Related to previous neck surgery. Dry mouth etc. No acute changes  Plan -ENT referral for further evaluation of this since patient notes it is bothersome -discussed input regarding mx of dry mouth.   FOLLOW UP: -Phone visit with Dr. KIrene Limboin 3 weeks with labs 1 day prior -Please add MD visit and labs to her Zometa and nutritional therapy appointment on 10/12/2021.  . The total time spent in the appointment was 30 minutes and more than 50% was on counseling and direct patient cares.   All of the patient's questions were answered with apparent satisfaction. The patient knows to call the clinic with any problems, questions or concerns.   GSullivan LoneMD [id number] [facility] IncCHays Surgery CenterHematology/Oncology Physician  [place]    .

## 2021-09-20 ENCOUNTER — Inpatient Hospital Stay: Payer: Medicare Other | Admitting: Hematology

## 2021-09-22 ENCOUNTER — Other Ambulatory Visit: Payer: Self-pay | Admitting: Hematology

## 2021-09-22 ENCOUNTER — Other Ambulatory Visit: Payer: Self-pay

## 2021-09-22 ENCOUNTER — Inpatient Hospital Stay: Payer: Medicare Other

## 2021-09-22 ENCOUNTER — Inpatient Hospital Stay: Payer: Medicare Other | Admitting: Hematology

## 2021-09-22 VITALS — BP 148/83 | HR 110 | Temp 97.5°F | Resp 18 | Wt 89.0 lb

## 2021-09-22 DIAGNOSIS — C7951 Secondary malignant neoplasm of bone: Secondary | ICD-10-CM | POA: Diagnosis not present

## 2021-09-22 DIAGNOSIS — C73 Malignant neoplasm of thyroid gland: Secondary | ICD-10-CM

## 2021-09-22 LAB — SAMPLE TO BLOOD BANK

## 2021-09-22 LAB — T4, FREE: Free T4: 1.78 ng/dL — ABNORMAL HIGH (ref 0.61–1.12)

## 2021-09-22 LAB — CBC WITH DIFFERENTIAL/PLATELET
Abs Immature Granulocytes: 0.01 10*3/uL (ref 0.00–0.07)
Basophils Absolute: 0 10*3/uL (ref 0.0–0.1)
Basophils Relative: 1 %
Eosinophils Absolute: 0.2 10*3/uL (ref 0.0–0.5)
Eosinophils Relative: 6 %
HCT: 47.7 % — ABNORMAL HIGH (ref 36.0–46.0)
Hemoglobin: 15.6 g/dL — ABNORMAL HIGH (ref 12.0–15.0)
Immature Granulocytes: 0 %
Lymphocytes Relative: 18 %
Lymphs Abs: 0.7 10*3/uL (ref 0.7–4.0)
MCH: 26.5 pg (ref 26.0–34.0)
MCHC: 32.7 g/dL (ref 30.0–36.0)
MCV: 81 fL (ref 80.0–100.0)
Monocytes Absolute: 0.4 10*3/uL (ref 0.1–1.0)
Monocytes Relative: 9 %
Neutro Abs: 2.7 10*3/uL (ref 1.7–7.7)
Neutrophils Relative %: 66 %
Platelets: 119 10*3/uL — ABNORMAL LOW (ref 150–400)
RBC: 5.89 MIL/uL — ABNORMAL HIGH (ref 3.87–5.11)
RDW: 14.5 % (ref 11.5–15.5)
WBC: 4.1 10*3/uL (ref 4.0–10.5)
nRBC: 0 % (ref 0.0–0.2)

## 2021-09-22 LAB — CEA (IN HOUSE-CHCC): CEA (CHCC-In House): 157.36 ng/mL — ABNORMAL HIGH (ref 0.00–5.00)

## 2021-09-22 LAB — PHOSPHORUS: Phosphorus: 3.2 mg/dL (ref 2.5–4.6)

## 2021-09-22 LAB — CMP (CANCER CENTER ONLY)
ALT: 36 U/L (ref 0–44)
AST: 31 U/L (ref 15–41)
Albumin: 3.9 g/dL (ref 3.5–5.0)
Alkaline Phosphatase: 121 U/L (ref 38–126)
Anion gap: 9 (ref 5–15)
BUN: 20 mg/dL (ref 8–23)
CO2: 28 mmol/L (ref 22–32)
Calcium: 9 mg/dL (ref 8.9–10.3)
Chloride: 105 mmol/L (ref 98–111)
Creatinine: 0.82 mg/dL (ref 0.44–1.00)
GFR, Estimated: >60 mL/min (ref >60)
Glucose, Bld: 103 mg/dL — ABNORMAL HIGH (ref 70–99)
Potassium: 4.2 mmol/L (ref 3.5–5.1)
Sodium: 142 mmol/L (ref 135–145)
Total Bilirubin: 1.6 mg/dL — ABNORMAL HIGH (ref 0.3–1.2)
Total Protein: 6.7 g/dL (ref 6.5–8.1)

## 2021-09-22 LAB — TSH: TSH: 2.572 u[IU]/mL (ref 0.308–3.960)

## 2021-09-22 LAB — MAGNESIUM: Magnesium: 1.9 mg/dL (ref 1.7–2.4)

## 2021-09-23 LAB — CALCITONIN: Calcitonin: 1224 pg/mL — ABNORMAL HIGH (ref 0.0–5.0)

## 2021-09-24 ENCOUNTER — Telehealth: Payer: Self-pay | Admitting: Hematology

## 2021-09-24 NOTE — Telephone Encounter (Signed)
Scheduled per sch msg. Called and spoke with patient. Confirmed appt  

## 2021-09-28 ENCOUNTER — Encounter: Payer: Self-pay | Admitting: Hematology

## 2021-09-30 ENCOUNTER — Inpatient Hospital Stay: Payer: Medicare Other | Attending: Hematology | Admitting: Hematology

## 2021-09-30 DIAGNOSIS — C7951 Secondary malignant neoplasm of bone: Secondary | ICD-10-CM

## 2021-09-30 DIAGNOSIS — C73 Malignant neoplasm of thyroid gland: Secondary | ICD-10-CM | POA: Diagnosis not present

## 2021-09-30 DIAGNOSIS — M810 Age-related osteoporosis without current pathological fracture: Secondary | ICD-10-CM | POA: Insufficient documentation

## 2021-09-30 DIAGNOSIS — R197 Diarrhea, unspecified: Secondary | ICD-10-CM

## 2021-10-01 ENCOUNTER — Other Ambulatory Visit: Payer: Self-pay | Admitting: Internal Medicine

## 2021-10-01 DIAGNOSIS — E890 Postprocedural hypothyroidism: Secondary | ICD-10-CM

## 2021-10-01 DIAGNOSIS — E3122 Multiple endocrine neoplasia [MEN] type IIA: Secondary | ICD-10-CM

## 2021-10-01 MED ORDER — LEVOTHYROXINE SODIUM 112 MCG PO TABS: 112.0000 ug | ORAL_TABLET | Freq: Every day | ORAL | 3 refills | Status: DC

## 2021-10-01 NOTE — Progress Notes (Signed)
Patient notified and verbalized understanding. 

## 2021-10-01 NOTE — Progress Notes (Signed)
Vm left for patient to call back.

## 2021-10-04 NOTE — Progress Notes (Signed)
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 total thyroidectomy in January of 2011 and bilateral neck dissection in June of 2011.  B. Persistent hypercalcitoninemia postoperatively. Calcitonin 499 in 07/2011  C. December 2012: CT of neck/chest was negative for metastases D. She had a normal neck ultrasound in 01/2012 showing her cystic LN was smaller (the one biopsied and negative).  E. FDG PET 08/2012 due to her last calcitonin raising substantially to 739 from the 500s: concerning right cervical lymph node F. 11/08/2012: Level 6 LN dissection. She had 2/2 LN positive, largest 2cm for MTC. Calcitonin one week later from her surgery was down to 515.  G. Calcitonin 592 on 05/24/2013. Repeat ultrasound on that same day which showed 2 at least partially cystic lymph nodes at level 4 which had been previously biopsied and negative; decreased in size since 2012. It also showed a subcentimeter right level VB LN of low suspicion. H. Her metanephrines (plasma) and PTH were normal in 12/2013 Urine studies for pheo were negative in 12/2013. I. 08/04/2014: CEA 9.0; calcitonin 1019 J. 09/05/2014:  - C/A/P CT scan: Interval development of a new calcified nodule in the left lower lobe. - Bone scan: Slight asymmetric increased radiotracer uptake in the right lateral calvarium on the frontal radiograph - Neck U/S: Stable right lateral compartment level 4 and 5B lymph nodes. The cystic right level 4 lymph node, previously biopsied on 01/14/2011 with negative pathology results.  K. 09/23/2014: Thoracoscopy: level 9 LN: Metastatic medullary thyroid carcinoma.  L.  03/11/2015: CEA 15.9; calcitonin 1149 M 04-13-2015:  - C/A/P CT scan: Previously reported left lower lobe pulmonary nodule is no longer identified. No new or enlarging pulmonary nodules. No evidence of metastatic disease in the abdomen or pelvis. - CT brain:No acute intracranial abnormalities.   New small CSF density subdural fluid collection overlying the right cerebral hemisphere, likely a small hygroma of uncertain etiology. There is no associated bony or parenchymal lesion. 04-13-2015: Bone scan: A non-specific, very small focus of increased tracer activity in the right frontal calvarium is not significantly changed from the prior study. - Neck U/S: There is hypoechoic lesion with questionable microcalcification located in right level IV, it is 0.4 x 0.6 x 0.9 cm. No other pathologic cervical lymphadenopathy seen.  N. 04/13/2015: CEA 14.6; calcitonin 1324 O. 07/16/2015 ([facility]): CEA 19.4 (outsode of [facility]); CAlcitonin 1860. P. 09-07-2015: Neck, Chest , Abd, [place] CT: Stable post-thyroidectomy changes without evidence of recurrent or metastatic disease. Stable pulmonary nodules. Hepatic cysts. No bone mets. CEA: 16; Calcitonin 1533 Q. 03-08-2016: C/A/P CT scan: No definite evidence of metastatic disease in the chest, abdomen or pelvis. Compared to 09/07/2015, there is a new linear 3 mm right lower lobe pulmonary nodule which is indeterminate and may represent focal atelectasis CEA 16.5; CAlcitonin 1839  R. 08-22-2016: Calcitonin: 4596; CEA 27.1  S. 09-06-2016:  - Bone scan: Redemonstrated likely two foci of increased radiotracer uptake within the right calvarium, similar to prior study. - Neck CT: No evidence of recurrent or metastatic disease in the neck. - C/A/P CT: No definite evidence of metastatic disease in the chest, abdomen, or pelvis. - Neck CT: Status  post thyroidectomy and lymph node dissection without evidence of recurrent or metastatic disease.  T. Bone scan: A focus of increased tracer  activity projecting over the posterior right approximate 8th rib is non-specific; this finding may relate to summation of tracer activity in the rib with the inferior tip of the overlying right scapula, post-traumatic change or skeletal metastasis; recommend attention on follow-up. Re-demonstration of subtly increased tracer activity in the right frontal calvarium, stable since 09/05/2014. U. 02-13-2017:  - C/A/P CT scan: nonenlarged mediastinal lymph nodes and soft tissue nodule in the right thyroidectomy bed. Unchanged irregular opacities in the lung apices bilaterally, which may represent treated metastases or scarring.  - Neck CT: Numerous soft tissue enhancing lesions in the superior mediastinum. While these are stable when compared to most recent CT, several of these lesions demonstrate minimal growth from the more remote exam on 09/07/2015. No new enhancing lesions are present. - CEA 25.1, Calcitonin: 5498 V. 08-28-2017: fractionate plasma free metanephrines: Normetanephrine 178 (24hr urine pending as this was thought to be non-specific), Metanephrine: 44 (normal). CEA 46; Calcitonin 9763 W. 09-27-2017: Bone scan: Stable increased radiotracer activity in the right frontal calvarium, indeterminate. No other evidence of osseous metastasis Chest CT/Abd: Mildly enlarged mediastinal lymph nodes are stable. No evidence of metastatic disease in the abdomen   HISTORY OF PRESENTING ILLNESS:   Shannon [name] is a wonderful 80 y.o. female who has been referred to [place] by [place] oncologist Dr. Leslie Andrea for management of Metastatic Medullary Thyroid Cancer. She is looking for a Financial controller in [place].  Today she is accompanied by her sister. Patient notes this started with a nodule in her throat and her endocrinologist started further work up. She has been seeing Med onc at [facility] since her first surgery every 3-6 months.   She has had thyroid surgery and Ldissection but no systemic  therapy as detailed above in the oncologic history.  She notes her family had testing for MEN2A mutation. Her sister, her nephew carry the gene. She notes her maternal aunt had goiters but not sure of the cause.   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 She has had diarrhea since 2010. Her last colonoscopy was in 2010. She has tried multiple treatment options but this continues to worsen over time. She currently takes imodium 4-6 X daily, cholestyramine twice daily. She started monthly Sandostatin on 10/20/17. She fell and broke her hip on 10/23/17 and had surgery same day. She was given hydrocodone which helped her diarrhea and is not sure if the Sandostatin is working for her. She has been on oral iron and she is on aspirin 317m since her hip surgery. She does PT exercises and has been healing well.    She recently stopped caffeine tablets that she was taking for energy. She stopped 3 months ago. She is on Reclast for her osteoporosis, which is managed by her PCP, Dr. KDeatra Ina Pt notes she stopped smoking about 40 years ago and she rarely drinks alcohol.   On review of symptoms, pt notes continued diarrhea, stable energy, weight loss of 6-8 pounds in the last 6 months. Her appetite is adequate. Her diarrhea occurs currently twice a day with "pudding" form to liquid form stool. She can go up to 5-7 times a day at times. She denies cramping with diarrhea. She denies blood, and mucus in stool. Current use of oral iron causes some black stool, which was not present before use.  INTERVAL HISTORY:  Galilea Quito returns for an earlier than scheduled follow-up for her metastatic medullary thyroid cancer on account of significantly increased fatigue and failure to thrive. Patient notes that her reflux has returned to some degree.  Her diarrhea is still quite well controlled. No other acute new focal symptoms. Notes that she is just feeling much weaker and more fatigued  with decreased p.o. intake. Has not really increased for G-tube nutrition or hydration in the context of decreased p.o. intake and was recommended to connect with Ernestene Kiel for continued optimization of nutritional therapy nutritional therapy. No fevers no chills no night sweats. We discussed holding her Cometriq at this time getting additional labs to evaluate her symptoms.  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 DUCTAL CARCINOMA IN SITU./ RADIATION   CATARACT EXTRACTION W/ INTRAOCULAR LENS IMPLANT  2009   BOTH EYES   FEMUR IM NAIL Left 10/23/2017   Procedure: INTRAMEDULLARY (IM) NAIL FEMORAL;  Surgeon: Paralee Cancel, MD;  Location: [facility];  Service: Orthopedics;  Laterality: Left;   FOOT SURGERY  1070   RIGHT FOOT - PINCHED NERVE   IR GASTROSTOMY TUBE MOD SED  01/13/2021   LUNG SURGERY     CALCIFICATIONS, POSITIVE - CANCER LYMPHNODE    MELANOMA EXCISION  2008   CHEST AREA   NECK SURGERY  Nov 09 2011   LYMPHNODES REMOVED 2 POSITIVE    RIGHT COLECTOMY  06/02/2009   THYROIDECTOMY  11/06/2009   TONSILLECTOMY AND ADENOIDECTOMY      SOCIAL HISTORY: Social History   Socioeconomic History   Marital status: Married    Spouse name: Not on file   Number of children: Not on file   Years of education: Not on file   Highest education level: Not on file  Occupational History   Not on file  Tobacco Use   Smoking status: Former    Types: Cigarettes    Quit date: 08/10/1960    Years since  quitting: 61.1   Smokeless tobacco: Never  Vaping Use   Vaping Use: Never used  Substance and Sexual Activity   Alcohol use: No    Alcohol/week: 0.0 standard drinks   Drug use: No   Sexual activity: Never  Other Topics Concern   Not on file  Social History Narrative   Not on file   Social Determinants of Health   Financial Resource Strain: Not on file  Food Insecurity: Not on file  Transportation Needs: Not on file  Physical Activity: Not on file  Stress: Not on file  Social Connections: Not on file  Intimate Partner Violence: Not on file    FAMILY HISTORY: Family History  Problem Relation Age of Onset   Cancer Father        throat/ bone   Breast cancer Sister    Cancer Sister        MEDULAR CANCER   Cancer Brother  prostate   Cancer Other        MEDULAR CANCER   Breast cancer Maternal Aunt    Cancer Paternal Aunt        pancreatic    Cancer Cousin        ON MOTHER'S SIDE- LIVER CANCER    ALLERGIES:  is allergic to codeine.  MEDICATIONS:  Current Outpatient Medications  Medication Sig Dispense Refill   ALPRAZolam (XANAX) 1 MG tablet Take 0.5 tablets (0.5 mg total) by mouth 2 (two) times daily. 6 tablet 0   bacitracin ophthalmic ointment      cabozantinib s-malate (COMETRIQ) capsule (3 x $Re'20mg'fxu$  kit) Take 60 mg by mouth daily. 1 kit 1   Calcium Carb-Cholecalciferol 600-200 MG-UNIT TABS 2 tablets     Calcium Carbonate-Vit D-Min (CALCIUM 600 + MINERALS PO) Take 1 tablet by mouth daily.     cetirizine (ZYRTEC) 10 MG tablet Take 10 mg by mouth daily.     Cholecalciferol (VITAMIN D) 1000 UNITS capsule Take 1,000 Units by mouth daily.     cholestyramine (QUESTRAN) 4 g packet DISSOLVE 1 PACKET IN LIQUID OF CHOICE AND DRINK 2 TIMES A DAY 60 packet 5   Cod Liver Oil 1000 MG CAPS Take 1,000 mg by mouth daily.     diphenhydramine-acetaminophen (TYLENOL PM EXTRA STRENGTH) 25-500 MG TABS tablet 1-2 tabs     Glucosamine-Chondroitin 250-200 MG CAPS Take 1 tablet by mouth  daily. Name of table is SCHIFF     loperamide (IMODIUM A-D) 2 MG tablet Take 2 mg by mouth 3 (three) times daily.     Multiple Vitamin (MULTIVITAMIN) tablet Take 1 tablet by mouth daily.     ondansetron (ZOFRAN) 8 MG tablet Take 1 tablet by mouth every 8 hours as needed for nausea or vomiting. 30 tablet 0   pantoprazole (PROTONIX) 40 MG tablet Take 1 tablet (40 mg total) by mouth 2 (two) times daily before a meal. 60 tablet 1   simethicone (MYLICON) 80 MG chewable tablet Chew 80 mg by mouth 3 (three) times daily.     sucralfate (CARAFATE) 1 g tablet Take 1 tablet (1 g total) by mouth 4 (four) times daily -  with meals and at bedtime. 420 tablet 0   vitamin E 180 MG (400 UNITS) capsule 1 tablet     zoledronic acid (RECLAST) 5 MG/100ML SOLN injection Inject 5 mg into the vein once.     levothyroxine (SYNTHROID) 112 MCG tablet Take 1 tablet (112 mcg total) by mouth daily. 90 tablet 3   No current facility-administered medications for this visit.    REVIEW OF SYSTEMS:   .10 Point review of Systems was done is negative except as noted above.  PHYSICAL EXAMINATION:  ECOG PERFORMANCE STATUS: 1 - Symptomatic but completely ambulatory   Vitals:   09/22/21 1245  BP: (!) 148/83  Pulse: (!) 110  Resp: 18  Temp: (!) 97.5 F (36.4 C)  SpO2: 97%  Weight: 89 lb (40.4 kg)  . GENERAL:alert, in no acute distress and comfortable SKIN: no acute rashes, no significant lesions EYES: conjunctiva are pink and non-injected, sclera anicteric OROPHARYNX: MMM, no exudates, no oropharyngeal erythema or ulceration NECK: supple, no JVD LYMPH:  no palpable lymphadenopathy in the cervical, axillary or inguinal regions LUNGS: clear to auscultation b/l with normal respiratory effort HEART: regular rate & rhythm ABDOMEN:  normoactive bowel sounds , non tender, not distended. Extremity: no pedal edema PSYCH: alert & oriented x 3 with fluent speech NEURO: no focal  motor/sensory deficits   LABORATORY DATA:  I  have reviewed the data as listed  CBC Latest Ref Rng & Units 09/22/2021 08/31/2021 08/03/2021  WBC 4.0 - 10.5 K/uL 4.1 5.8 5.2  Hemoglobin 12.0 - 15.0 g/dL 15.6(H) 15.0 12.9  Hematocrit 36.0 - 46.0 % 47.7(H) 45.4 39.8  Platelets 150 - 400 K/uL 119(L) 166 221   . CMP Latest Ref Rng & Units 09/22/2021 08/31/2021 08/03/2021  Glucose 70 - 99 mg/dL 103(H) 95 96  BUN 8 - 23 mg/dL $Remove'20 18 18  'Xyoulfq$ Creatinine 0.44 - 1.00 mg/dL 0.82 0.66 0.72  Sodium 135 - 145 mmol/L 142 139 140  Potassium 3.5 - 5.1 mmol/L 4.2 4.1 4.2  Chloride 98 - 111 mmol/L 105 103 101  CO2 22 - 32 mmol/L $RemoveB'28 30 29  'xGYcuXmx$ Calcium 8.9 - 10.3 mg/dL 9.0 8.2(L) 9.6  Total Protein 6.5 - 8.1 g/dL 6.7 6.5 7.0  Total Bilirubin 0.3 - 1.2 mg/dL 1.6(H) 0.9 0.7  Alkaline Phos 38 - 126 U/L 121 114 87  AST 15 - 41 U/L 31 40 20  ALT 0 - 44 U/L 36 37 14   02/12/18 Calcitonin:     PATHOLOGY  Lung Biopsy at [facility] 09/23/14 DIAGNOSIS A. Pleural nodule, biopsy:  Calcified and hyalinized remote fat necrosis. No evidence of malignancy.   B. Level 9 lymph node, biopsy:  Metastatic medullary thyroid carcinoma. See comment.  Comment:: A note is made of the patient's history of medullary thyroid carcinoma with documented prior metastatic disease to cervical lymph nodes ([id number]; 04/05/2010).  The prior metastases to cervical lymph nodes are reviewed in conjunction to the current case and the tumors are morphologically similar.   Confirmatory immunohistochemical stains are performed. Tumor cells stain positively for calcitonin and for an anticytokeratin cocktail. The morphologic and immunophenotypic findings support metastatic medullary thyroid carcinoma.   C. Level 8 lymph node, biopsy:  Profiles of lymph nodes, negative for malignancy.   D. Pleural nodule #2, biopsy:  Fibrovascular tissue with extensive cauterization. No evidence of malignancy.        Surgical Pathology at [facility] 11/08/12 Immunohistochemical Findings An immunohistochemical  stain for calcitonin obtained on paraffin block A1 is positive.    Diagnosis A. "RIGHT LEVEL 6 LYMPH NODES" (BIOPSY):      METASTATIC MEDULLARY THYROID CARCINOMA IN TWO LYMPH NODES (2/2).     SIZE OF LARGEST METASTASIS:  2 CM.     EXTRANODAL INVASION:  PRESENT. Comment:  I certify that I personally conducted the diagnostic evaluation of the above  specimen(s) and have rendered the above diagnosis(es).                             Rex C. Telford Nab, M.D. Telephone:[phone]                             Electronically signed: 11/16/12   A. "Right level 6 lymph nodes", received fresh and placed in formalin on 1/9/ two fragments of tan-red soft tissue.  Fragment 1 is 1.3 x 1 x 0.6 cm and is bisected and submitted in block A1.  Fragment 2 is 2 x 1 x 0.4 cm and is submitted entirely in block A2.    RADIOGRAPHIC STUDIES: I have personally reviewed the radiological images as listed and agreed with the findings in the report. No results found.    CT Chest w Contrast w 3D MIPS Protocol at [facility] 09/29/17  Impression: 1.  Mildly enlarged mediastinal lymph nodes are stable when compared to most recent CT. Although, several nodes are minimally increased from 03/08/2016.  2.  Refer to separately dictated abdominal CT report for findings below the diaphragm.  Bone Scan Whole Body at [facility] 09/27/17 Impression: 1. Stable foci of increased radiotracer activity in the right frontal calvarium, indeterminate. 2. No new scintigraphic evidence of osseous metastasis.  US Lymph Node Neck Mapping at [facility] 08/29/17 Impression: 1.  Newly measured right level 5 lymph node (lymph node #5) with abnormal rounded morphology, no echogenic hilum, and microcalcifications. Although biopsy of this nodule may be technically difficult due to its location deep to the carotid vasculature, ultrasound-guided biopsy is recommended (if possible).  2.  Newly measured right level 2A lymph node without suspicious features.  3.   Additional right neck nodes are unchanged.   Bone Density Scan 02/27/17 @ [facility]  Lowest Site Measured: Total Left hip with T-score of -3.1 and BMD of 0.568   CT CAP W Contrast W MIPS at [facility] 02/13/17 Impression: 1. Please see CT neck completed same day for findings in the neck. 2. Stable nonenlarged mediastinal lymph nodes and soft tissue nodule in the right thyroidectomy bed. 3. Unchanged irregular opacities in the lung apices bilaterally, which may represent treated metastases or scarring. No new metastatic disease in the chest, abdomen, or pelvis.  CT Nect Soft Tissue W Contrast at [facility] 02/13/17 IMPRESSION: Numerous soft tissue enhancing lesions in the superior mediastinum. While these are stable when compared to most recent CT, several of these lesions demonstrate minimal growth from the more remote exam on 09/07/2015. No new enhancing lesions are present.  ASSESSMENT & PLAN:   TYLENA PRISK is a 79 y.o. caucasian female with   1. 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  She had a level 6 LN dissection on 11/08/12 with 2/2 LN positive, largest 2cm for MTC.  09/05/14 CAP CT with development of new calcified nodules in the left lower lobe 09/23/14 Lung biopsy showed Level 9 lymph node, positive for malignancy and metastatic cancer which was resected. Treatment for symptomatic disease with vandetanib and cabozantibnib were previously discussed with her 1 Oncologist but the patient had decided to hold off on treatment due to concerns for toxicities and lack of overt cancer related progressive symptoms. Latest scans from 08/2017 are stable, no disease found below chest or in the bones. Her calcitonin levels have been increasing since 2015. Overall pt currently asymptomatic.   CT chest on 10/17/2018 which revealed  "1. Unchanged mediastinal and right supraclavicular lymphadenopathy. Findings are nonspecific, however metastatic disease is not excluded. 2. Stable biapical scarring and right apical consolidative focus with associated calcification, likely postinfectious in nature."  10/11/2018 CT angiography chest w contrast revealed "1. No demonstrable pulmonary embolus. No thoracic aortic aneurysm or dissection. There are foci of aortic atherosclerosis as well as great vessel and coronary artery calcifications. 2. Fibrosis and cicatrization in the apices with apical pleural calcification, likely due to scarring. There is focal airspace opacity which is asymmetric in the apical segment of the right upper lobe. This finding may well be due to chronic scarring. A potential focus of pneumonia or possible neoplastic involvement in this area must be of concern. This finding may well warrant correlation with nuclear medicine PET study to assess for abnormal metabolic activity. At a  minimum, a follow-up chest CT in 3 months to assess for stability of this area would be advised. No other airspace consolidation noted. Areas of patchy atelectasis noted. No well-defined nodular type lesions noted in the lung parenchyma. 3. Probable sclerotic bony metastases at C3 and L1. Sclerotic focus at L1 is incompletely visualized. Medullary thyroid carcinoma as well as breast carcinoma potentially may present with sclerotic as opposed to lytic metastases. 4.  No demonstrable thoracic adenopathy. 5. Extensive postoperative change in the thyroid region with evidence of total thyroidectomy. 6. Mild reflux into the inferior vena cava and hepatic veins suggests increase in right heart pressure. Aortic Atherosclerosis (ICD10-I70.0)."  12/06/2019 CT C/A/P ([id number]) ([id number]) revealed "1. Stable exam. No new or progressive findings to suggest progressive metastatic disease. 2. Scattered sclerotic bone lesions, similar to prior. 3. Stable mild  intrahepatic and extrahepatic biliary duct dilatation. 4. Trace free fluid in the cul-de-sac."  12/06/2019 CT Soft Tissue Neck ([id number]) revealed "1. Thyroidectomy with nodal metastases at the thoracic inlet/anterior compartment and right supraclavicular fossa. There are no prior neck CT comparison is available for review. 2. Spinal osseous metastatic disease without visible extraosseous tumor extension."  06/22/2020 CT C/A/P ([id number]) ([id number]) revealed "1. Borderline enlarged supraclavicular and mediastinal lymph nodes, stable. 2. Osseous metastatic disease, stable. 3. Left lower lobe aspiration and trace left pleural fluid. 4. Hepatomegaly. 5. Aortic atherosclerosis."  2. 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  Significant new fatigue since last clinic visit PLAN:  -Discussed in detail patient's worsening and severe fatigue that has affected her functional status and suppressed her appetite significantly. -We discussed holding her Cometriq at this time and focusing on optimizing her nutritional status. -Labs were sent today to evaluate for other side effects of the Cometriq CBC is stable with no anemia hemoglobin of 15.6, platelets mildly lower at 119k and WBC count normal at 4.1k CMP normal potassium of 4.2 normal renal function creatinine 0.82 bilirubin slightly up at 1.6 -Other electrolytes including magnesium and phosphorus within normal limits -CEA and calcitonin level sent out and are currently pending -Her diarrhea is well controlled at this time and she will continue cholestyramine and as needed Lomotil -Continue PPI and sucralfate for severe heartburns  -Follow-up with Ernestene Kiel to  optimize hydration and tube feeding through the G-tube. -Continue 2000 IU Vitamin D daily.  3. Osteoporosis - Last DEXA 02/27/17 with Lowest Site Measured: Total Left hip with T-score of -3.1 and BMD of 0.568 Closed left hip fracture from fall -s/p surgery on 10/23/17 with rod placement - healing well.   4. Chronic oropharyngeal Dysphagia ? Related to previous neck surgery. Dry mouth etc. No acute changes    FOLLOW UP: RTC with Dr Irene Limbo with labs in 6 weeks  . The total time spent in the appointment was 30 minutes and more than 50% was on counseling and direct patient cares, extensively discussing multiple etiologies of fatigue lab work-up planned and toxicity assessment for Cometriq.   All of the patient's questions were answered with apparent satisfaction. The patient knows to call the clinic with any problems, questions or concerns.   Sullivan Lone MD MS AAHIVMS [facility] Hematology/Oncology Physician [facility]    .

## 2021-10-06 ENCOUNTER — Encounter: Payer: Self-pay | Admitting: Hematology

## 2021-10-09 NOTE — Progress Notes (Signed)
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 total thyroidectomy in January of 2011 and bilateral neck dissection in June of 2011.  B. Persistent hypercalcitoninemia postoperatively. Calcitonin 499 in 07/2011  C. December 2012: CT of neck/chest was negative for metastases D. She had a normal neck ultrasound in 01/2012 showing her cystic LN was smaller (the one biopsied and negative).  E. FDG PET 08/2012 due to her last calcitonin raising substantially to 739 from the 500s: concerning right cervical lymph node F. 11/08/2012: Level 6 LN dissection. She had 2/2 LN positive, largest 2cm for MTC. Calcitonin one week later from her surgery was down to 515.  G. Calcitonin 592 on 05/24/2013. Repeat ultrasound on that same day which showed 2 at least partially cystic lymph nodes at level 4 which had been previously biopsied and negative; decreased in size since 2012. It also showed a subcentimeter right level VB LN of low suspicion. H. Her metanephrines (plasma) and PTH were normal in 12/2013 Urine studies for pheo were negative in 12/2013. I. 08/04/2014: CEA 9.0; calcitonin 1019 J. 09/05/2014:  - C/A/P CT scan: Interval development of a new calcified nodule in the left lower lobe. - Bone scan: Slight asymmetric increased radiotracer uptake in the right lateral calvarium on the frontal radiograph - Neck U/S: Stable right lateral compartment level 4 and 5B lymph nodes. The cystic right level 4 lymph node, previously biopsied on 01/14/2011 with negative pathology results.  K. 09/23/2014: Thoracoscopy: level 9 LN: Metastatic medullary  thyroid carcinoma.  L. 03/11/2015: CEA 15.9; calcitonin 1149 M 04-13-2015:  - C/A/P CT scan: Previously reported left lower lobe pulmonary nodule is no longer identified. No new or enlarging pulmonary nodules. No evidence of metastatic disease in the abdomen or pelvis. - CT brain:No acute intracranial abnormalities.   New small CSF density subdural fluid collection overlying the right cerebral hemisphere, likely a small hygroma of uncertain etiology. There is no associated bony or parenchymal lesion. 04-13-2015: Bone scan: A non-specific, very small focus of increased tracer activity in the right frontal calvarium is not significantly changed from the prior study. - Neck U/S: There is hypoechoic lesion with questionable microcalcification located in right level IV, it is 0.4 x 0.6 x 0.9 cm. No other pathologic cervical lymphadenopathy seen.  N. 04/13/2015: CEA 14.6; calcitonin 1324 O. 07/16/2015 ([facility]): CEA 19.4 (outsode of [facility]); CAlcitonin 1860. P. 09-07-2015: Neck, Chest , Abd, [place] CT: Stable post-thyroidectomy changes without evidence of recurrent or metastatic disease. Stable pulmonary nodules. Hepatic cysts. No bone mets. CEA: 16; Calcitonin 1533 Q. 03-08-2016: C/A/P CT scan: No definite evidence of metastatic disease in the chest, abdomen or pelvis. Compared to 09/07/2015, there is a new linear 3 mm right lower lobe pulmonary nodule which is indeterminate and may represent focal atelectasis CEA 16.5; CAlcitonin 1839  R. 08-22-2016: Calcitonin: 4596; CEA 27.1  S. 09-06-2016:  - Bone scan: Redemonstrated likely two foci of increased radiotracer uptake within the right calvarium, similar to prior study. - Neck CT: No evidence of recurrent or metastatic disease in the neck. - C/A/P CT: No definite evidence of metastatic disease in the chest, abdomen, or pelvis. -  Neck CT: Status post thyroidectomy and lymph node dissection without evidence of recurrent or metastatic disease.  T. Bone scan: A  focus of increased tracer activity projecting over the posterior right approximate 8th rib is non-specific; this finding may relate to summation of tracer activity in the rib with the inferior tip of the overlying right scapula, post-traumatic change or skeletal metastasis; recommend attention on follow-up. Re-demonstration of subtly increased tracer activity in the right frontal calvarium, stable since 09/05/2014. U. 02-13-2017:  - C/A/P CT scan: nonenlarged mediastinal lymph nodes and soft tissue nodule in the right thyroidectomy bed. Unchanged irregular opacities in the lung apices bilaterally, which may represent treated metastases or scarring.  - Neck CT: Numerous soft tissue enhancing lesions in the superior mediastinum. While these are stable when compared to most recent CT, several of these lesions demonstrate minimal growth from the more remote exam on 09/07/2015. No new enhancing lesions are present. - CEA 25.1, Calcitonin: 5498 V. 08-28-2017: fractionate plasma free metanephrines: Normetanephrine 178 (24hr urine pending as this was thought to be non-specific), Metanephrine: 44 (normal). CEA 46; Calcitonin 9763 W. 09-27-2017: Bone scan: Stable increased radiotracer activity in the right frontal calvarium, indeterminate. No other evidence of osseous metastasis Chest CT/Abd: Mildly enlarged mediastinal lymph nodes are stable. No evidence of metastatic disease in the abdomen   HISTORY OF PRESENTING ILLNESS:   YARDEN MANUELITO is a wonderful 80 y.o. female who has been referred to [place] by [place] oncologist Dr. Leslie Andrea for management of Metastatic Medullary Thyroid Cancer. She is looking for a Financial controller in [place].  Today she is accompanied by her sister. Patient notes this started with a nodule in her throat and her endocrinologist started further work up. She has been seeing Med onc at [facility] since her first surgery every 3-6 months.   She has had thyroid surgery and  Ldissection but no systemic therapy as detailed above in the oncologic history.  She notes her family had testing for MEN2A mutation. Her sister, her nephew carry the gene. She notes her maternal aunt had goiters but not sure of the cause.   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 She has had diarrhea since 2010. Her last colonoscopy was in 2010. She has tried multiple treatment options but this continues to worsen over time. She currently takes imodium 4-6 X daily, cholestyramine twice daily. She started monthly Sandostatin on 10/20/17. She fell and broke her hip on 10/23/17 and had surgery same day. She was given hydrocodone which helped her diarrhea and is not sure if the Sandostatin is working for her. She has been on oral iron and she is on aspirin 331m since her hip surgery. She does PT exercises and has been healing well.    She recently stopped caffeine tablets that she was taking for energy. She stopped 3 months ago. She is on Reclast for her osteoporosis, which is managed by her PCP, Dr. KDeatra Ina Pt notes she stopped smoking about 40 years ago and she rarely drinks alcohol.   On review of symptoms, pt notes continued diarrhea, stable energy, weight loss of 6-8 pounds in the last 6 months. Her appetite is adequate. Her diarrhea occurs currently twice a day with "pudding" form to liquid form stool. She can go up to 5-7 times a day at times. She denies cramping with diarrhea. She denies blood, and mucus in stool. Current use of oral iron causes some black stool, which was not present before  use.   INTERVAL HISTORY:  .I connected with Berkley Harvey on .09/30/2021 at 11:40 AM EST by telephone visit and verified that I am speaking with the correct person using two identifiers.   I discussed the limitations, risks, security and privacy concerns of performing an evaluation and management service by telemedicine and the availability of in-person appointments. I  also discussed with the patient that there may be a patient responsible charge related to this service. The patient expressed understanding and agreed to proceed.   Other persons participating in the visit and their role in the encounter: none   Patient's location: home  Provider's location: [place]  Chief Complaint: Follow-up for discussion of lab results and evaluation of treatment toxicity related to management of her metastatic medullary thyroid cancer.  Patient notes that her fatigue has somewhat improved since she has been off the Cometriq.  She notes she has been eating somewhat better since she has been off the medicine. Has connected with Ernestene Kiel to optimize her tube feeding and hydration through the feeding tube. Notes that her acid reflux continues to be reasonably controlled. No significant diarrhea at this time which is well controlled with her current bowel regimen. No fevers no chills no night sweats no other acute new focal symptoms.  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 DUCTAL CARCINOMA IN SITU./ RADIATION   CATARACT EXTRACTION W/ INTRAOCULAR LENS IMPLANT  2009   BOTH EYES   FEMUR IM NAIL Left 10/23/2017   Procedure: INTRAMEDULLARY (IM) NAIL FEMORAL;  Surgeon: Paralee Cancel, MD;  Location: [place];  Service: Orthopedics;  Laterality: Left;   FOOT SURGERY  1070   RIGHT FOOT - PINCHED NERVE   IR GASTROSTOMY TUBE MOD SED  01/13/2021   LUNG SURGERY      CALCIFICATIONS, POSITIVE - CANCER LYMPHNODE    MELANOMA EXCISION  2008   CHEST AREA   NECK SURGERY  Nov 09 2011   LYMPHNODES REMOVED 2 POSITIVE    RIGHT COLECTOMY  06/02/2009   THYROIDECTOMY  11/06/2009   TONSILLECTOMY AND ADENOIDECTOMY      SOCIAL HISTORY: Social History   Socioeconomic History   Marital status: Married    Spouse name: Not on file   Number of children: Not on file   Years of education: Not on file   Highest education level: Not on file  Occupational History   Not on file  Tobacco Use   Smoking status: Former    Types: Cigarettes    Quit date: 08/10/1960    Years since quitting: 61.2   Smokeless tobacco: Never  Vaping Use   Vaping Use: Never used  Substance and Sexual Activity   Alcohol use: No    Alcohol/week: 0.0 standard drinks   Drug use: No   Sexual activity: Never  Other Topics Concern   Not on file  Social History Narrative   Not  on file   Social Determinants of Health   Financial Resource Strain: Not on file  Food Insecurity: Not on file  Transportation Needs: Not on file  Physical Activity: Not on file  Stress: Not on file  Social Connections: Not on file  Intimate Partner Violence: Not on file    FAMILY HISTORY: Family History  Problem Relation Age of Onset   Cancer Father        throat/ bone   Breast cancer Sister    Cancer Sister        MEDULAR CANCER   Cancer Brother        prostate   Cancer Other        MEDULAR CANCER   Breast cancer Maternal Aunt    Cancer Paternal Aunt        pancreatic    Cancer Cousin        ON MOTHER'S SIDE- LIVER CANCER    ALLERGIES:  is allergic to codeine.  MEDICATIONS:  Current Outpatient Medications  Medication Sig Dispense Refill   ALPRAZolam (XANAX) 1 MG tablet Take 0.5 tablets (0.5 mg total) by mouth 2 (two) times daily. 6 tablet 0   bacitracin ophthalmic ointment      cabozantinib s-malate (COMETRIQ) capsule (3 x 95m kit) Take 60 mg by mouth daily. 1 kit 1   Calcium  Carb-Cholecalciferol 600-200 MG-UNIT TABS 2 tablets     Calcium Carbonate-Vit D-Min (CALCIUM 600 + MINERALS PO) Take 1 tablet by mouth daily.     cetirizine (ZYRTEC) 10 MG tablet Take 10 mg by mouth daily.     Cholecalciferol (VITAMIN D) 1000 UNITS capsule Take 1,000 Units by mouth daily.     cholestyramine (QUESTRAN) 4 g packet DISSOLVE 1 PACKET IN LIQUID OF CHOICE AND DRINK 2 TIMES A DAY 60 packet 5   Cod Liver Oil 1000 MG CAPS Take 1,000 mg by mouth daily.     diphenhydramine-acetaminophen (TYLENOL PM EXTRA STRENGTH) 25-500 MG TABS tablet 1-2 tabs     Glucosamine-Chondroitin 250-200 MG CAPS Take 1 tablet by mouth daily. Name of table is SCHIFF     levothyroxine (SYNTHROID) 112 MCG tablet Take 1 tablet (112 mcg total) by mouth daily. 90 tablet 3   loperamide (IMODIUM A-D) 2 MG tablet Take 2 mg by mouth 3 (three) times daily.     Multiple Vitamin (MULTIVITAMIN) tablet Take 1 tablet by mouth daily.     ondansetron (ZOFRAN) 8 MG tablet Take 1 tablet by mouth every 8 hours as needed for nausea or vomiting. 30 tablet 0   pantoprazole (PROTONIX) 40 MG tablet Take 1 tablet (40 mg total) by mouth 2 (two) times daily before a meal. 60 tablet 1   simethicone (MYLICON) 80 MG chewable tablet Chew 80 mg by mouth 3 (three) times daily.     sucralfate (CARAFATE) 1 g tablet Take 1 tablet (1 g total) by mouth 4 (four) times daily -  with meals and at bedtime. 420 tablet 0   vitamin E 180 MG (400 UNITS) capsule 1 tablet     zoledronic acid (RECLAST) 5 MG/100ML SOLN injection Inject 5 mg into the vein once.     No current facility-administered medications for this visit.    REVIEW OF SYSTEMS:   Review of system as noted above  PHYSICAL EXAMINATION:  Telemedicine visit  LABORATORY DATA:  I have reviewed the data as listed  CBC Latest Ref Rng & Units 09/22/2021 08/31/2021 08/03/2021  WBC 4.0 - 10.5 K/uL 4.1 5.8  5.2  Hemoglobin 12.0 - 15.0 g/dL 15.6(H) 15.0 12.9  Hematocrit 36.0 - 46.0 % 47.7(H) 45.4  39.8  Platelets 150 - 400 K/uL 119(L) 166 221   . CMP Latest Ref Rng & Units 09/22/2021 08/31/2021 08/03/2021  Glucose 70 - 99 mg/dL 103(H) 95 96  BUN 8 - 23 mg/dL _0 Creatinine 0.44 - 1.00 mg/dL 0.82 0.66 0.72  Sodium 135 - 145 mmol/L 142 139 140  Potassium 3.5 - 5.1 mmol/L 4.2 4.1 4.2  Chloride 98 - 111 mmol/L 105 103 101  CO2 22 - 32 mmol/L _1 Calcium 8.9 - 10.3 mg/dL 9.0 8.2(L) 9.6  Total Protein 6.5 - 8.1 g/dL 6.7 6.5 7.0  Total Bilirubin 0.3 - 1.2 mg/dL 1.6(H) 0.9 0.7  Alkaline Phos 38 - 126 U/L 121 114 87  AST 15 - 41 U/L 31 40 20  ALT 0 - 44 U/L 36 37 14   Component     Latest Ref Rng & Units 09/22/2021  T4,Free(Direct)     0.61 - 1.12 ng/dL 1.78 (H)  TSH     0.308 - 3.960 uIU/mL 2.572  Phosphorus     2.5 - 4.6 mg/dL 3.2  Magnesium     1.7 - 2.4 mg/dL 1.9         PATHOLOGY  Lung Biopsy at [facility] 09/23/14 DIAGNOSIS A. Pleural nodule, biopsy:  Calcified and hyalinized remote fat necrosis. No evidence of malignancy.   B. Level 9 lymph node, biopsy:  Metastatic medullary thyroid carcinoma. See comment.  Comment:: A note is made of the patient's history of medullary thyroid carcinoma with documented prior metastatic disease to cervical lymph nodes ([id number]; 04/05/2010).  The prior metastases to cervical lymph nodes are reviewed in conjunction to the current case and the tumors are morphologically similar.   Confirmatory immunohistochemical stains are performed. Tumor cells stain positively for calcitonin and for an anticytokeratin cocktail. The morphologic and immunophenotypic findings support metastatic medullary thyroid carcinoma.   C. Level 8 lymph node, biopsy:  Profiles of lymph nodes, negative for malignancy.   D. Pleural nodule #2, biopsy:  Fibrovascular tissue with extensive cauterization. No evidence of malignancy.        Surgical Pathology at [facility] 11/08/12 Immunohistochemical Findings An immunohistochemical stain for  calcitonin obtained on paraffin block A1 is positive.    Diagnosis A. "RIGHT LEVEL 6 LYMPH NODES" (BIOPSY):      METASTATIC MEDULLARY THYROID CARCINOMA IN TWO LYMPH NODES (2/2).     SIZE OF LARGEST METASTASIS:  2 CM.     EXTRANODAL INVASION:  PRESENT. Comment:  I certify that I personally conducted the diagnostic evaluation of the above  specimen(s) and have rendered the above diagnosis(es).                             Rex C. Telford Nab, M.D. Telephone:[phone]                             Electronically signed: 11/16/12   A. "Right level 6 lymph nodes", received fresh and placed in formalin on 1/9/ two fragments of tan-red soft tissue.  Fragment 1 is 1.3 x 1 x 0.6 cm and is bisected and submitted in block A1.  Fragment 2 is 2 x 1 x 0.4 cm and is submitted entirely in block A2.    RADIOGRAPHIC STUDIES: I have personally reviewed the radiological images as  listed and agreed with the findings in the report. No results found.    CT Chest w Contrast w 3D MIPS Protocol at [facility] 09/29/17 Impression: 1.  Mildly enlarged mediastinal lymph nodes are stable when compared to most recent CT. Although, several nodes are minimally increased from 03/08/2016.  2.  Refer to separately dictated abdominal CT report for findings below the diaphragm.  Bone Scan Whole Body at [facility] 09/27/17 Impression: 1. Stable foci of increased radiotracer activity in the right frontal calvarium, indeterminate. 2. No new scintigraphic evidence of osseous metastasis.  US Lymph Node Neck Mapping at [facility] 08/29/17 Impression: 1.  Newly measured right level 5 lymph node (lymph node #5) with abnormal rounded morphology, no echogenic hilum, and microcalcifications. Although biopsy of this nodule may be technically difficult due to its location deep to the carotid vasculature, ultrasound-guided biopsy is recommended (if possible).  2.  Newly measured right level 2A lymph node without suspicious features.  3.   Additional right neck nodes are unchanged.   Bone Density Scan 02/27/17 @ [facility]  Lowest Site Measured: Total Left hip with T-score of -3.1 and BMD of 0.568   CT CAP W Contrast W MIPS at [facility] 02/13/17 Impression: 1. Please see CT neck completed same day for findings in the neck. 2. Stable nonenlarged mediastinal lymph nodes and soft tissue nodule in the right thyroidectomy bed. 3. Unchanged irregular opacities in the lung apices bilaterally, which may represent treated metastases or scarring. No new metastatic disease in the chest, abdomen, or pelvis.  CT Nect Soft Tissue W Contrast at [facility] 02/13/17 IMPRESSION: Numerous soft tissue enhancing lesions in the superior mediastinum. While these are stable when compared to most recent CT, several of these lesions demonstrate minimal growth from the more remote exam on 09/07/2015. No new enhancing lesions are present.  ASSESSMENT & PLAN:   WILHELMINA HARK is a 80 y.o. caucasian female with   1. 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  She had a level 6 LN dissection on 11/08/12 with 2/2 LN positive, largest 2cm for MTC.  09/05/14 CAP CT with development of new calcified nodules in the left lower lobe 09/23/14 Lung biopsy showed Level 9 lymph node, positive for malignancy and metastatic cancer which was resected. Treatment for symptomatic disease with vandetanib and cabozantibnib were previously discussed with her 37 Oncologist but the patient had decided to hold off on treatment due to concerns for toxicities and lack of overt cancer related progressive symptoms. Latest scans from 08/2017 are stable, no disease found below chest or in the bones. Her calcitonin levels have been increasing since 2015. Overall pt currently asymptomatic.   CT chest on 10/17/2018 which revealed  "1. Unchanged mediastinal and right supraclavicular lymphadenopathy. Findings are nonspecific, however metastatic disease is not excluded. 2. Stable biapical scarring and right apical consolidative focus with associated calcification, likely postinfectious in nature."  10/11/2018 CT angiography chest w contrast revealed "1. No demonstrable pulmonary embolus. No thoracic aortic aneurysm or dissection. There are foci of aortic atherosclerosis as well as great vessel and coronary artery calcifications. 2. Fibrosis and cicatrization in the apices with apical pleural calcification, likely due to scarring. There is focal airspace opacity which is asymmetric in the apical segment of the right upper lobe. This finding may well be due to chronic scarring. A potential focus of pneumonia or possible neoplastic involvement  in this area must be of concern. This finding may well warrant correlation with nuclear medicine PET study to assess for abnormal metabolic activity. At a minimum, a follow-up chest CT in 3 months to assess for stability of this area would be advised. No other airspace consolidation noted. Areas of patchy atelectasis noted. No well-defined nodular type lesions noted in the lung parenchyma. 3. Probable sclerotic bony metastases at C3 and L1. Sclerotic focus at L1 is incompletely visualized. Medullary thyroid carcinoma as well as breast carcinoma potentially may present with sclerotic as opposed to lytic metastases. 4.  No demonstrable thoracic adenopathy. 5. Extensive postoperative change in the thyroid region with evidence of total thyroidectomy. 6. Mild reflux into the inferior vena cava and hepatic veins suggests increase in right heart pressure. Aortic Atherosclerosis (ICD10-I70.0)."  12/06/2019 CT C/A/P ([id number]) ([id number]) revealed "1. Stable exam. No new or progressive findings to suggest progressive metastatic disease. 2. Scattered sclerotic bone lesions, similar to prior. 3. Stable mild  intrahepatic and extrahepatic biliary duct dilatation. 4. Trace free fluid in the cul-de-sac."  12/06/2019 CT Soft Tissue Neck ([id number]) revealed "1. Thyroidectomy with nodal metastases at the thoracic inlet/anterior compartment and right supraclavicular fossa. There are no prior neck CT comparison is available for review. 2. Spinal osseous metastatic disease without visible extraosseous tumor extension."  06/22/2020 CT C/A/P ([id number]) ([id number]) revealed "1. Borderline enlarged supraclavicular and mediastinal lymph nodes, stable. 2. Osseous metastatic disease, stable. 3. Left lower lobe aspiration and trace left pleural fluid. 4. Hepatomegaly. 5. Aortic atherosclerosis."  2. 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  Grade 3 fatigue related to the Cometriq at 60 mg p.o. daily-has been off Cometriq since 09/22/2021 PLAN:  -Patient notes she is feeling better since she has been off the Cometriq with decrease in fatigue and somewhat improved p.o. intake. -She is optimizing her tube feeding in consultation with our nutritional therapist Ernestene Kiel. -We discussed continuing to stay off Cometriq at this time and this is also patient's preference. -Her calcitonin level did improve significantly down from 62k to 1224. -Electrolytes chemistries and CBC stable. -Diarrhea reasonably controlled with as needed Lomotil and cholestyramine at this time.She will -She will continue her PPI and sucralfate for severe heartburns. -Continue vitamin D 2000 units daily. -Will monitor off PPI therapy at this time.  3. Osteoporosis - Last DEXA 02/27/17 with Lowest Site Measured: Total Left hip with T-score of -3.1 and BMD of  0.568 Closed left hip fracture from fall -s/p surgery on 10/23/17 with rod placement - healing well.   4. Chronic oropharyngeal Dysphagia ? Related to previous neck surgery. Dry mouth etc. No acute changes    FOLLOW UP: RTC with Dr Irene Limbo with labs in about 4 to 6 weeks   All of the patient's questions were answered with apparent satisfaction. The patient knows to call the clinic with any problems, questions or concerns.   Sullivan Lone MD MS AAHIVMS [facility] Hematology/Oncology Physician [facility]    .

## 2021-10-11 ENCOUNTER — Other Ambulatory Visit: Payer: Self-pay

## 2021-10-11 DIAGNOSIS — C73 Malignant neoplasm of thyroid gland: Secondary | ICD-10-CM

## 2021-10-12 ENCOUNTER — Inpatient Hospital Stay: Payer: Medicare Other

## 2021-10-12 ENCOUNTER — Inpatient Hospital Stay: Payer: Medicare Other | Admitting: Dietician

## 2021-10-12 ENCOUNTER — Other Ambulatory Visit: Payer: Self-pay

## 2021-10-12 ENCOUNTER — Inpatient Hospital Stay: Payer: Medicare Other | Admitting: Hematology

## 2021-10-12 VITALS — BP 143/69 | HR 101 | Temp 97.9°F | Resp 18

## 2021-10-12 DIAGNOSIS — C73 Malignant neoplasm of thyroid gland: Secondary | ICD-10-CM

## 2021-10-12 DIAGNOSIS — C7951 Secondary malignant neoplasm of bone: Secondary | ICD-10-CM

## 2021-10-12 DIAGNOSIS — M810 Age-related osteoporosis without current pathological fracture: Secondary | ICD-10-CM | POA: Diagnosis not present

## 2021-10-12 DIAGNOSIS — R197 Diarrhea, unspecified: Secondary | ICD-10-CM

## 2021-10-12 LAB — CMP (CANCER CENTER ONLY)
ALT: 15 U/L (ref 0–44)
AST: 20 U/L (ref 15–41)
Albumin: 3.9 g/dL (ref 3.5–5.0)
Alkaline Phosphatase: 86 U/L (ref 38–126)
Anion gap: 11 (ref 5–15)
BUN: 18 mg/dL (ref 8–23)
CO2: 28 mmol/L (ref 22–32)
Calcium: 9.1 mg/dL (ref 8.9–10.3)
Chloride: 103 mmol/L (ref 98–111)
Creatinine: 0.73 mg/dL (ref 0.44–1.00)
GFR, Estimated: >60 mL/min (ref >60)
Glucose, Bld: 95 mg/dL (ref 70–99)
Potassium: 4.3 mmol/L (ref 3.5–5.1)
Sodium: 142 mmol/L (ref 135–145)
Total Bilirubin: 0.7 mg/dL (ref 0.3–1.2)
Total Protein: 6.7 g/dL (ref 6.5–8.1)

## 2021-10-12 LAB — CBC WITH DIFFERENTIAL (CANCER CENTER ONLY)
Abs Immature Granulocytes: 0.02 10*3/uL (ref 0.00–0.07)
Basophils Absolute: 0 10*3/uL (ref 0.0–0.1)
Basophils Relative: 1 %
Eosinophils Absolute: 0.1 10*3/uL (ref 0.0–0.5)
Eosinophils Relative: 3 %
HCT: 36 % (ref 36.0–46.0)
Hemoglobin: 11.8 g/dL — ABNORMAL LOW (ref 12.0–15.0)
Immature Granulocytes: 0 %
Lymphocytes Relative: 12 %
Lymphs Abs: 0.6 10*3/uL — ABNORMAL LOW (ref 0.7–4.0)
MCH: 29.2 pg (ref 26.0–34.0)
MCHC: 32.8 g/dL (ref 30.0–36.0)
MCV: 89.1 fL (ref 80.0–100.0)
Monocytes Absolute: 0.6 10*3/uL (ref 0.1–1.0)
Monocytes Relative: 13 %
Neutro Abs: 3.4 10*3/uL (ref 1.7–7.7)
Neutrophils Relative %: 71 %
Platelet Count: 239 10*3/uL (ref 150–400)
RBC: 4.04 MIL/uL (ref 3.87–5.11)
RDW: 19.6 % — ABNORMAL HIGH (ref 11.5–15.5)
WBC Count: 4.7 10*3/uL (ref 4.0–10.5)
nRBC: 0 % (ref 0.0–0.2)

## 2021-10-12 MED ORDER — ZOLEDRONIC ACID 4 MG/100ML IV SOLN
4.0000 mg | Freq: Once | INTRAVENOUS | Status: AC
  Administered 2021-10-12: 4 mg via INTRAVENOUS
  Filled 2021-10-12: qty 100

## 2021-10-12 MED ORDER — SODIUM CHLORIDE 0.9 % IV SOLN: Freq: Once | INTRAVENOUS | Status: AC

## 2021-10-12 NOTE — Progress Notes (Signed)
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   Met with patient during infusion. She reports appetite as well as energy level have improved after stopping the Cometriq. She continues using PEG for supplemental tube feedings, reports 3 cartons Nutren 1.5/day. She is drinking one Boost Plus and eating 2 meals. She eats an egg with cheese or gravy with a piece of toast for breakfast. Patient and husband have been receiving Meals on Wheels, last night she ate spaghetti. Patient reports significant improvement to acid reflux, she is taking PPI and sucralfate. Patient reports 2 loose/watery bowel movements daily. This is the most normal they have every been. Patient continues to take Imodium. She denies nausea, vomiting, constipation.   Medications: reviewed   Labs: reviewed   Anthropometrics: No new weight today. Per pt weights increased to 91.6 lb on home scale. Last weight 89 lb on 11/23 decreased from 92 lb 12.8 oz on 10/4  NUTRITION DIAGNOSIS: Severe malnutrition ongoing, but improving   INTERVENTION:  Encouraged high calorie high protein foods to promote weight gain Continue 1 carton Nutren 1.5 via tube 3x/day with 60 ml water before and after each feeding Continue drinking Boost Plus/equivalent daily for added calories and protein Patient has contact information      MONITORING, EVALUATION, GOAL: weight trends, intake   NEXT VISIT: To be scheduled with treatment

## 2021-10-12 NOTE — Patient Instructions (Signed)

## 2021-10-31 ENCOUNTER — Encounter: Payer: Self-pay | Admitting: Hematology

## 2021-11-02 ENCOUNTER — Encounter: Payer: Self-pay | Admitting: Hematology

## 2021-11-03 ENCOUNTER — Inpatient Hospital Stay: Payer: Medicare Other

## 2021-11-03 ENCOUNTER — Other Ambulatory Visit: Payer: Self-pay

## 2021-11-03 ENCOUNTER — Inpatient Hospital Stay: Payer: Medicare Other | Attending: Hematology | Admitting: Hematology

## 2021-11-03 VITALS — BP 118/64 | HR 95 | Temp 97.5°F | Resp 18 | Wt 95.5 lb

## 2021-11-03 DIAGNOSIS — C73 Malignant neoplasm of thyroid gland: Secondary | ICD-10-CM | POA: Diagnosis present

## 2021-11-03 DIAGNOSIS — C7951 Secondary malignant neoplasm of bone: Secondary | ICD-10-CM

## 2021-11-03 DIAGNOSIS — C7800 Secondary malignant neoplasm of unspecified lung: Secondary | ICD-10-CM | POA: Diagnosis not present

## 2021-11-03 LAB — CMP (CANCER CENTER ONLY)
ALT: 14 U/L (ref 0–44)
AST: 20 U/L (ref 15–41)
Albumin: 4.2 g/dL (ref 3.5–5.0)
Alkaline Phosphatase: 82 U/L (ref 38–126)
Anion gap: 5 (ref 5–15)
BUN: 18 mg/dL (ref 8–23)
CO2: 33 mmol/L — ABNORMAL HIGH (ref 22–32)
Calcium: 9.5 mg/dL (ref 8.9–10.3)
Chloride: 101 mmol/L (ref 98–111)
Creatinine: 0.6 mg/dL (ref 0.44–1.00)
GFR, Estimated: >60 mL/min (ref >60)
Glucose, Bld: 93 mg/dL (ref 70–99)
Potassium: 4.4 mmol/L (ref 3.5–5.1)
Sodium: 139 mmol/L (ref 135–145)
Total Bilirubin: 0.8 mg/dL (ref 0.3–1.2)
Total Protein: 7.1 g/dL (ref 6.5–8.1)

## 2021-11-03 LAB — CBC WITH DIFFERENTIAL (CANCER CENTER ONLY)
Abs Immature Granulocytes: 0.01 10*3/uL (ref 0.00–0.07)
Basophils Absolute: 0.1 10*3/uL (ref 0.0–0.1)
Basophils Relative: 1 %
Eosinophils Absolute: 0.1 10*3/uL (ref 0.0–0.5)
Eosinophils Relative: 3 %
HCT: 38.8 % (ref 36.0–46.0)
Hemoglobin: 12.9 g/dL (ref 12.0–15.0)
Immature Granulocytes: 0 %
Lymphocytes Relative: 11 %
Lymphs Abs: 0.5 10*3/uL — ABNORMAL LOW (ref 0.7–4.0)
MCH: 30.1 pg (ref 26.0–34.0)
MCHC: 33.2 g/dL (ref 30.0–36.0)
MCV: 90.4 fL (ref 80.0–100.0)
Monocytes Absolute: 0.5 10*3/uL (ref 0.1–1.0)
Monocytes Relative: 11 %
Neutro Abs: 3.6 10*3/uL (ref 1.7–7.7)
Neutrophils Relative %: 74 %
Platelet Count: 217 10*3/uL (ref 150–400)
RBC: 4.29 MIL/uL (ref 3.87–5.11)
RDW: 17.4 % — ABNORMAL HIGH (ref 11.5–15.5)
WBC Count: 4.8 10*3/uL (ref 4.0–10.5)
nRBC: 0 % (ref 0.0–0.2)

## 2021-11-08 ENCOUNTER — Telehealth: Payer: Self-pay | Admitting: Hematology

## 2021-11-08 NOTE — Telephone Encounter (Signed)
Scheduled follow-up appointment per 1/4 los. Patient is aware. Mailed calendar.

## 2021-11-09 ENCOUNTER — Encounter: Payer: Self-pay | Admitting: Hematology

## 2021-11-09 NOTE — Progress Notes (Addendum)
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 total thyroidectomy in January of 2011 and bilateral neck dissection in June of 2011.  B. Persistent hypercalcitoninemia postoperatively. Calcitonin 499 in 07/2011  C. December 2012: CT of neck/chest was negative for metastases D. She had a normal neck ultrasound in 01/2012 showing her cystic LN was smaller (the one biopsied and negative).  E. FDG PET 08/2012 due to her last calcitonin raising substantially to 739 from the 500s: concerning right cervical lymph node F. 11/08/2012: Level 6 LN dissection. She had 2/2 LN positive, largest 2cm for MTC. Calcitonin one week later from her surgery was down to 515.  G. Calcitonin 592 on 05/24/2013. Repeat ultrasound on that same day which showed 2 at least partially cystic lymph nodes at level 4 which had been previously biopsied and negative; decreased in size since 2012. It also showed a subcentimeter right level VB LN of low suspicion. H. Her metanephrines (plasma) and PTH were normal in 12/2013 Urine studies for pheo were negative in 12/2013. I. 08/04/2014: CEA 9.0; calcitonin 1019 J. 09/05/2014:  - C/A/P CT scan: Interval development of a new calcified nodule in the left lower lobe. - Bone scan: Slight asymmetric increased radiotracer uptake in the right lateral calvarium on the frontal radiograph - Neck U/S: Stable right lateral compartment level 4 and 5B lymph nodes. The cystic right level 4 lymph node, previously biopsied on 01/14/2011 with negative pathology results.  K. 09/23/2014: Thoracoscopy: level 9 LN: Metastatic medullary  thyroid carcinoma.  L. 03/11/2015: CEA 15.9; calcitonin 1149 M 04-13-2015:  - C/A/P CT scan: Previously reported left lower lobe pulmonary nodule is no longer identified. No new or enlarging pulmonary nodules. No evidence of metastatic disease in the abdomen or pelvis. - CT brain:No acute intracranial abnormalities.   New small CSF density subdural fluid collection overlying the right cerebral hemisphere, likely a small hygroma of uncertain etiology. There is no associated bony or parenchymal lesion. 04-13-2015: Bone scan: A non-specific, very small focus of increased tracer activity in the right frontal calvarium is not significantly changed from the prior study. - Neck U/S: There is hypoechoic lesion with questionable microcalcification located in right level IV, it is 0.4 x 0.6 x 0.9 cm. No other pathologic cervical lymphadenopathy seen.  N. 04/13/2015: CEA 14.6; calcitonin 1324 O. 07/16/2015 ([facility]): CEA 19.4 (outsode of [facility]); CAlcitonin 1860. P. 09-07-2015: Neck, Chest , Abd, [place] CT: Stable post-thyroidectomy changes without evidence of recurrent or metastatic disease. Stable pulmonary nodules. Hepatic cysts. No bone mets. CEA: 16; Calcitonin 1533 Q. 03-08-2016: C/A/P CT scan: No definite evidence of metastatic disease in the chest, abdomen or pelvis. Compared to 09/07/2015, there is a new linear 3 mm right lower lobe pulmonary nodule which is indeterminate and may represent focal atelectasis CEA 16.5; CAlcitonin 1839  R. 08-22-2016: Calcitonin: 4596; CEA 27.1  S. 09-06-2016:  - Bone scan: Redemonstrated likely two foci of increased radiotracer uptake within the right calvarium, similar to prior study. - Neck CT: No evidence of recurrent or metastatic disease in the neck. - C/A/P CT: No definite evidence of metastatic disease in the chest, abdomen, or pelvis. -  Neck CT: Status post thyroidectomy and lymph node dissection without evidence of recurrent or metastatic disease.  T. Bone scan: A  focus of increased tracer activity projecting over the posterior right approximate 8th rib is non-specific; this finding may relate to summation of tracer activity in the rib with the inferior tip of the overlying right scapula, post-traumatic change or skeletal metastasis; recommend attention on follow-up. Re-demonstration of subtly increased tracer activity in the right frontal calvarium, stable since 09/05/2014. U. 02-13-2017:  - C/A/P CT scan: nonenlarged mediastinal lymph nodes and soft tissue nodule in the right thyroidectomy bed. Unchanged irregular opacities in the lung apices bilaterally, which may represent treated metastases or scarring.  - Neck CT: Numerous soft tissue enhancing lesions in the superior mediastinum. While these are stable when compared to most recent CT, several of these lesions demonstrate minimal growth from the more remote exam on 09/07/2015. No new enhancing lesions are present. - CEA 25.1, Calcitonin: 5498 V. 08-28-2017: fractionate plasma free metanephrines: Normetanephrine 178 (24hr urine pending as this was thought to be non-specific), Metanephrine: 44 (normal). CEA 46; Calcitonin 9763 W. 09-27-2017: Bone scan: Stable increased radiotracer activity in the right frontal calvarium, indeterminate. No other evidence of osseous metastasis Chest CT/Abd: Mildly enlarged mediastinal lymph nodes are stable. No evidence of metastatic disease in the abdomen   HISTORY OF PRESENTING ILLNESS:   Shannon [name] is a wonderful 81 y.o. female who has been referred to [place] by [place] oncologist Dr. Leslie Andrea for management of Metastatic Medullary Thyroid Cancer. She is looking for a Financial controller in [place].  Today she is accompanied by her sister. Patient notes this started with a nodule in her throat and her endocrinologist started further work up. She has been seeing Med onc at [facility] since her first surgery every 3-6 months.   She has had thyroid surgery and  Ldissection but no systemic therapy as detailed above in the oncologic history.  She notes her family had testing for MEN2A mutation. Her sister, her nephew carry the gene. She notes her maternal aunt had goiters but not sure of the cause.   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 She has had diarrhea since 2010. Her last colonoscopy was in 2010. She has tried multiple treatment options but this continues to worsen over time. She currently takes imodium 4-6 X daily, cholestyramine twice daily. She started monthly Sandostatin on 10/20/17. She fell and broke her hip on 10/23/17 and had surgery same day. She was given hydrocodone which helped her diarrhea and is not sure if the Sandostatin is working for her. She has been on oral iron and she is on aspirin 376m since her hip surgery. She does PT exercises and has been healing well.    She recently stopped caffeine tablets that she was taking for energy. She stopped 3 months ago. She is on Reclast for her osteoporosis, which is managed by her PCP, Dr. KDeatra Ina Pt notes she stopped smoking about 40 years ago and she rarely drinks alcohol.   On review of symptoms, pt notes continued diarrhea, stable energy, weight loss of 6-8 pounds in the last 6 months. Her appetite is adequate. Her diarrhea occurs currently twice a day with "pudding" form to liquid form stool. She can go up to 5-7 times a day at times. She denies cramping with diarrhea. She denies blood, and mucus in stool. Current use of oral iron causes some black stool, which was not present before  use.   INTERVAL HISTORY:  Shannon [name] is here for follow-up for continued evaluation and management of her metastatic medullary thyroid cancer.  She notes that since her last clinic visit and being off Cometriq she has felt much better with regards to her fatigue having nearly resolved.  Her diarrhea is significantly improved and her acid reflux is very well  controlled. She continues to have baseline swallowing issues related to previous surgery but has been able to eat a little bit more than previously. She continues to use her tube feeding to good effect. Patient notes her quality of life right now is better than it has been in the last several years. No other acute new focal symptoms noted. Labs today show stable CBC and CMP. Patient clearly wants to hold off on any specific treatments for her metastatic medullary thyroid cancer at this time.  Her calcitonin level did significantly dropped with her treatment however even at very low doses patient had significant fatigue anorexia and malaise related to Cometriq and prefers not to try the same medication again or similar alternative medication. We discussed that it is reasonable to monitor her off any treatments at this time. She would like to continue her Zometa for bone metastases which would be appropriate.  She has tolerated this well without any acute concerns.  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 DUCTAL CARCINOMA IN SITU./ RADIATION   CATARACT EXTRACTION W/ INTRAOCULAR LENS IMPLANT  2009   BOTH EYES   FEMUR IM NAIL Left 10/23/2017   Procedure: INTRAMEDULLARY (IM) NAIL FEMORAL;  Surgeon: Paralee Cancel, MD;  Location: [place] [facility];  Service: Orthopedics;  Laterality: Left;   FOOT SURGERY  1070   RIGHT FOOT - PINCHED NERVE   IR GASTROSTOMY TUBE MOD SED  01/13/2021   LUNG SURGERY     CALCIFICATIONS,  POSITIVE - CANCER LYMPHNODE    MELANOMA EXCISION  2008   CHEST AREA   NECK SURGERY  Nov 09 2011   LYMPHNODES REMOVED 2 POSITIVE    RIGHT COLECTOMY  06/02/2009   THYROIDECTOMY  11/06/2009   TONSILLECTOMY AND ADENOIDECTOMY      SOCIAL HISTORY: Social History   Socioeconomic History   Marital status: Married    Spouse name: Not on file   Number of children: Not on file   Years of education: Not on file   Highest education level: Not on file  Occupational History   Not on file  Tobacco Use   Smoking status: Former    Types: Cigarettes    Quit date: 08/10/1960    Years since quitting: 61.3   Smokeless tobacco: Never  Vaping Use   Vaping Use: Never used  Substance and Sexual Activity   Alcohol use: No    Alcohol/week: 0.0 standard drinks   Drug use: No   Sexual activity: Never  Other Topics Concern   Not on file  Social History Narrative   Not on file   Social Determinants of Health   Financial Resource Strain: Not on file  Food Insecurity: Not on file  Transportation Needs: Not on file  Physical Activity: Not on file  Stress: Not on file  Social Connections: Not on file  Intimate Partner Violence: Not on file    FAMILY HISTORY: Family History  Problem Relation Age of Onset   Cancer Father        throat/ bone   Breast cancer Sister    Cancer Sister        MEDULAR CANCER   Cancer Brother        prostate   Cancer Other        MEDULAR CANCER   Breast cancer Maternal Aunt    Cancer Paternal Aunt        pancreatic    Cancer Cousin        ON MOTHER'S SIDE- LIVER CANCER    ALLERGIES:  is allergic to codeine.  MEDICATIONS:  Current Outpatient Medications  Medication Sig Dispense Refill   ALPRAZolam (XANAX) 1 MG tablet Take 0.5 tablets (0.5 mg total) by mouth 2 (two) times daily. 6 tablet 0   bacitracin ophthalmic ointment      Calcium Carb-Cholecalciferol 600-200 MG-UNIT TABS 2 tablets     Calcium Carbonate-Vit D-Min (CALCIUM 600 + MINERALS PO) Take 1  tablet by mouth daily.     cetirizine (ZYRTEC) 10 MG tablet Take 10 mg by mouth daily.     Cholecalciferol (VITAMIN D) 1000 UNITS capsule Take 1,000 Units by mouth daily.     cholestyramine (QUESTRAN) 4 g packet DISSOLVE 1 PACKET IN LIQUID OF CHOICE AND DRINK 2 TIMES A DAY 60 packet 5   Cod Liver Oil 1000 MG CAPS Take 1,000 mg by mouth daily.     diphenhydramine-acetaminophen (TYLENOL PM EXTRA STRENGTH) 25-500 MG TABS tablet 1-2 tabs     Glucosamine-Chondroitin 250-200 MG CAPS Take 1 tablet by mouth daily. Name of table is SCHIFF     levothyroxine (SYNTHROID) 112 MCG tablet Take 1 tablet (112 mcg total) by mouth daily. 90 tablet 3   loperamide (IMODIUM A-D) 2 MG tablet Take 2 mg by mouth 3 (three) times daily.     Multiple Vitamin (MULTIVITAMIN) tablet Take 1 tablet by mouth daily.     simethicone (MYLICON) 80 MG chewable tablet Chew 80 mg by mouth 3 (three) times daily.     sucralfate (CARAFATE) 1 g tablet Take 1 tablet (1 g total) by mouth 4 (four) times daily -  with meals and at bedtime. 420 tablet 0   vitamin E 180 MG (400 UNITS) capsule 1 tablet     zoledronic acid (RECLAST) 5 MG/100ML SOLN injection Inject 5 mg into the vein once.     cabozantinib s-malate (COMETRIQ) capsule (3 x 75m kit) Take 60 mg by mouth daily. (Patient not taking: Reported on 11/03/2021) 1 kit 1   ondansetron (ZOFRAN) 8 MG tablet Take 1 tablet by mouth every 8 hours as needed for nausea or vomiting. (Patient not taking: Reported on 11/03/2021) 30 tablet 0   pantoprazole (PROTONIX) 40 MG tablet TAKE 1 TABLET BY MOUTH TWICE DAILY BEFORE A MEAL 60 tablet 0   No current facility-administered medications for this visit.    REVIEW OF SYSTEMS:   Review of system as noted above  PHYSICAL EXAMINATION:  Telemedicine visit  LABORATORY DATA:  I have reviewed the data as listed  CBC Latest Ref Rng &  Units 11/03/2021 10/12/2021 09/22/2021  WBC 4.0 - 10.5 K/uL 4.8 4.7 4.1  Hemoglobin 12.0 - 15.0 g/dL 12.9 11.8(L) 15.6(H)   Hematocrit 36.0 - 46.0 % 38.8 36.0 47.7(H)  Platelets 150 - 400 K/uL 217 239 119(L)   . CMP Latest Ref Rng & Units 11/03/2021 10/12/2021 09/22/2021  Glucose 70 - 99 mg/dL 93 95 103(H)  BUN 8 - 23 mg/dL 18 18 20   Creatinine 0.44 - 1.00 mg/dL 0.60 0.73 0.82  Sodium 135 - 145 mmol/L 139 142 142  Potassium 3.5 - 5.1 mmol/L 4.4 4.3 4.2  Chloride 98 - 111 mmol/L 101 103 105  CO2 22 - 32 mmol/L 33(H) 28 28  Calcium 8.9 - 10.3 mg/dL 9.5 9.1 9.0  Total Protein 6.5 - 8.1 g/dL 7.1 6.7 6.7  Total Bilirubin 0.3 - 1.2 mg/dL 0.8 0.7 1.6(H)  Alkaline Phos 38 - 126 U/L 82 86 121  AST 15 - 41 U/L 20 20 31   ALT 0 - 44 U/L 14 15 36   Component     Latest Ref Rng & Units 09/22/2021  T4,Free(Direct)     0.61 - 1.12 ng/dL 1.78 (H)  TSH     0.308 - 3.960 uIU/mL 2.572  Phosphorus     2.5 - 4.6 mg/dL 3.2  Magnesium     1.7 - 2.4 mg/dL 1.9         PATHOLOGY  Lung Biopsy at [facility] 09/23/14 DIAGNOSIS A. Pleural nodule, biopsy:  Calcified and hyalinized remote fat necrosis. No evidence of malignancy.   B. Level 9 lymph node, biopsy:  Metastatic medullary thyroid carcinoma. See comment.  Comment:: A note is made of the patient's history of medullary thyroid carcinoma with documented prior metastatic disease to cervical lymph nodes ([id number]; 04/05/2010).  The prior metastases to cervical lymph nodes are reviewed in conjunction to the current case and the tumors are morphologically similar.   Confirmatory immunohistochemical stains are performed. Tumor cells stain positively for calcitonin and for an anticytokeratin cocktail. The morphologic and immunophenotypic findings support metastatic medullary thyroid carcinoma.   C. Level 8 lymph node, biopsy:  Profiles of lymph nodes, negative for malignancy.   D. Pleural nodule #2, biopsy:  Fibrovascular tissue with extensive cauterization. No evidence of malignancy.        Surgical Pathology at [facility] 11/08/12 Immunohistochemical Findings  An immunohistochemical stain for calcitonin obtained on paraffin block A1 is positive.    Diagnosis A. "RIGHT LEVEL 6 LYMPH NODES" (BIOPSY):      METASTATIC MEDULLARY THYROID CARCINOMA IN TWO LYMPH NODES (2/2).     SIZE OF LARGEST METASTASIS:  2 CM.     EXTRANODAL INVASION:  PRESENT. Comment:  I certify that I personally conducted the diagnostic evaluation of the above  specimen(s) and have rendered the above diagnosis(es).                             Rex C. Telford Nab, M.D. [phone]                             Electronically signed: 11/16/12   A. "Right level 6 lymph nodes", received fresh and placed in formalin on 1/9/ two fragments of tan-red soft tissue.  Fragment 1 is 1.3 x 1 x 0.6 cm and is bisected and submitted in block A1.  Fragment 2 is 2 x 1 x 0.4 cm and is submitted entirely in block A2.  RADIOGRAPHIC STUDIES: I have personally reviewed the radiological images as listed and agreed with the findings in the report. No results found.    CT Chest w Contrast w 3D MIPS Protocol at [facility] 09/29/17 Impression: 1.  Mildly enlarged mediastinal lymph nodes are stable when compared to most recent CT. Although, several nodes are minimally increased from 03/08/2016.  2.  Refer to separately dictated abdominal CT report for findings below the diaphragm.  Bone Scan Whole Body at [facility] 09/27/17 Impression: 1. Stable foci of increased radiotracer activity in the right frontal calvarium, indeterminate. 2. No new scintigraphic evidence of osseous metastasis.  US Lymph Node Neck Mapping at [facility] 08/29/17 Impression: 1.  Newly measured right level 5 lymph node (lymph node #5) with abnormal rounded morphology, no echogenic hilum, and microcalcifications. Although biopsy of this nodule may be technically difficult due to its location deep to the carotid vasculature, ultrasound-guided biopsy is recommended (if possible).  2.  Newly measured right level 2A lymph node without  suspicious features.  3.  Additional right neck nodes are unchanged.   Bone Density Scan 02/27/17 @ [facility]  Lowest Site Measured: Total Left hip with T-score of -3.1 and BMD of 0.568   CT CAP W Contrast W MIPS at [facility] 02/13/17 Impression: 1. Please see CT neck completed same day for findings in the neck. 2. Stable nonenlarged mediastinal lymph nodes and soft tissue nodule in the right thyroidectomy bed. 3. Unchanged irregular opacities in the lung apices bilaterally, which may represent treated metastases or scarring. No new metastatic disease in the chest, abdomen, or pelvis.  CT Nect Soft Tissue W Contrast at [facility] 02/13/17 IMPRESSION: Numerous soft tissue enhancing lesions in the superior mediastinum. While these are stable when compared to most recent CT, several of these lesions demonstrate minimal growth from the more remote exam on 09/07/2015. No new enhancing lesions are present.  ASSESSMENT & PLAN:   Shannon [name] is a 81 y.o. caucasian female with   1. 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  She had a level 6 LN dissection on 11/08/12 with 2/2 LN positive, largest 2cm for MTC.  09/05/14 CAP CT with development of new calcified nodules in the left lower lobe 09/23/14 Lung biopsy showed Level 9 lymph node, positive for malignancy and metastatic cancer which was resected. Treatment for symptomatic disease with vandetanib and cabozantibnib were previously discussed with her 60 Oncologist but the patient had decided to hold off on treatment due to concerns for toxicities and lack of overt cancer related progressive symptoms. Latest scans from 08/2017 are stable, no disease found below chest or in the bones. Her calcitonin levels have been increasing since 2015. Overall pt currently asymptomatic.   CT chest on  10/17/2018 which revealed "1. Unchanged mediastinal and right supraclavicular lymphadenopathy. Findings are nonspecific, however metastatic disease is not excluded. 2. Stable biapical scarring and right apical consolidative focus with associated calcification, likely postinfectious in nature."  10/11/2018 CT angiography chest w contrast revealed "1. No demonstrable pulmonary embolus. No thoracic aortic aneurysm or dissection. There are foci of aortic atherosclerosis as well as great vessel and coronary artery calcifications. 2. Fibrosis and cicatrization in the apices with apical pleural calcification, likely due to scarring. There is focal airspace opacity which is asymmetric in the apical segment of the right upper lobe. This finding may well be due to chronic  scarring. A potential focus of pneumonia or possible neoplastic involvement in this area must be of concern. This finding may well warrant correlation with nuclear medicine PET study to assess for abnormal metabolic activity. At a minimum, a follow-up chest CT in 3 months to assess for stability of this area would be advised. No other airspace consolidation noted. Areas of patchy atelectasis noted. No well-defined nodular type lesions noted in the lung parenchyma. 3. Probable sclerotic bony metastases at C3 and L1. Sclerotic focus at L1 is incompletely visualized. Medullary thyroid carcinoma as well as breast carcinoma potentially may present with sclerotic as opposed to lytic metastases. 4.  No demonstrable thoracic adenopathy. 5. Extensive postoperative change in the thyroid region with evidence of total thyroidectomy. 6. Mild reflux into the inferior vena cava and hepatic veins suggests increase in right heart pressure. Aortic Atherosclerosis (ICD10-I70.0)."  12/06/2019 CT C/A/P ([id number]) ([id number]) revealed "1. Stable exam. No new or progressive findings to suggest progressive metastatic disease. 2. Scattered sclerotic bone lesions, similar to  prior. 3. Stable mild intrahepatic and extrahepatic biliary duct dilatation. 4. Trace free fluid in the cul-de-sac."  12/06/2019 CT Soft Tissue Neck ([id number]) revealed "1. Thyroidectomy with nodal metastases at the thoracic inlet/anterior compartment and right supraclavicular fossa. There are no prior neck CT comparison is available for review. 2. Spinal osseous metastatic disease without visible extraosseous tumor extension."  06/22/2020 CT C/A/P ([id number]) ([id number]) revealed "1. Borderline enlarged supraclavicular and mediastinal lymph nodes, stable. 2. Osseous metastatic disease, stable. 3. Left lower lobe aspiration and trace left pleural fluid. 4. Hepatomegaly. 5. Aortic atherosclerosis."  2. 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  Grade 3 fatigue related to the Cometriq at 60 mg p.o. daily-has been off Cometriq since 09/22/2021 PLAN:  -notes that since her last clinic visit and being off Cometriq she has felt much better with regards to her fatigue having nearly resolved.   -Her diarrhea is significantly improved and her acid reflux is very well controlled. -She will continue as needed Lomotil and cholestyramine for her diarrhea. -She will continue her PPI and sucralfate for heartburns. She continues to have baseline swallowing issues related to previous surgery but has been able to eat a little bit more than previously. She continues to use her tube feeding to good effect.  She will continue to follow with Ernestene Kiel to optimize her tube feeding. Patient notes her quality of life right now is better than it has been in the last several years. No other acute new focal symptoms noted. Labs  today show stable CBC and CMP. Patient clearly wants to hold off on any specific treatments for her metastatic medullary thyroid cancer at this time.  Her calcitonin level did significantly dropped with her treatment however even at very low doses patient had significant fatigue anorexia and malaise related to Cometriq and prefers not to try the same medication again or similar alternative medication. We discussed that it is reasonable to monitor her off any treatments at this time. She would like to continue her Zometa for bone mets and osteoporosis every 3 months -Continue vitamin D 2000 units daily.   3. Osteoporosis - Last DEXA 02/27/17 with Lowest Site Measured: Total Left hip with T-score of -3.1 and BMD  of 0.568 Closed left hip fracture from fall -s/p surgery on 10/23/17 with rod placement - healing well.  -Continue Zometa  4. Chronic oropharyngeal Dysphagia ? Related to previous neck surgery. Dry mouth etc. No acute changes  -Continue tube feeding  FOLLOW UP: RTC with Dr Irene Limbo with labs and next dose of Zometa in 4 months  All of the patient's questions were answered with apparent satisfaction. The patient knows to call the clinic with any problems, questions or concerns.   Sullivan Lone MD MS AAHIVMS [facility] Hematology/Oncology [facility]    .Marland KitchenMarland Kitchen

## 2021-11-11 ENCOUNTER — Other Ambulatory Visit: Payer: Self-pay | Admitting: Hematology

## 2021-11-11 DIAGNOSIS — C73 Malignant neoplasm of thyroid gland: Secondary | ICD-10-CM

## 2021-11-30 ENCOUNTER — Other Ambulatory Visit: Payer: Self-pay | Admitting: Hematology

## 2021-11-30 DIAGNOSIS — C73 Malignant neoplasm of thyroid gland: Secondary | ICD-10-CM

## 2021-12-07 NOTE — Telephone Encounter (Signed)
Patient stated she is no longer on Cometriq therapy, EASE closed the case.  [place] Patient [place] Phone [phone] Fax [phone] 12/07/2021 11:02 AM

## 2022-01-07 ENCOUNTER — Telehealth: Payer: Self-pay | Admitting: Hematology

## 2022-01-07 NOTE — Telephone Encounter (Signed)
Rescheduled upcoming appointment due to provider's schedule. Mailed calendar. ?

## 2022-01-17 ENCOUNTER — Other Ambulatory Visit: Payer: Self-pay

## 2022-01-17 ENCOUNTER — Encounter: Payer: Self-pay | Admitting: Internal Medicine

## 2022-01-17 ENCOUNTER — Ambulatory Visit: Payer: Medicare Other | Admitting: Internal Medicine

## 2022-01-17 ENCOUNTER — Other Ambulatory Visit: Payer: Self-pay | Admitting: Hematology

## 2022-01-17 VITALS — BP 110/66 | HR 101 | Ht 63.0 in | Wt 93.2 lb

## 2022-01-17 DIAGNOSIS — E3122 Multiple endocrine neoplasia [MEN] type IIA: Secondary | ICD-10-CM

## 2022-01-17 DIAGNOSIS — C73 Malignant neoplasm of thyroid gland: Secondary | ICD-10-CM

## 2022-01-17 DIAGNOSIS — E890 Postprocedural hypothyroidism: Secondary | ICD-10-CM

## 2022-01-17 DIAGNOSIS — C801 Malignant (primary) neoplasm, unspecified: Secondary | ICD-10-CM

## 2022-01-17 LAB — TSH: TSH: 16.19 u[IU]/mL — ABNORMAL HIGH (ref 0.35–5.50)

## 2022-01-17 NOTE — Progress Notes (Signed)
? ?Name: Shannon [name]  ?MRN/ DOB: [id number], 01-19-1941    ?Age/ Sex: 82 y.o., female   ? ? 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   ?Initial Endocrinology Clinic Visit: 07/15/2019  ? ? ?PATIENT IDENTIFIER: Shannon [name] is a 81 y.o., female with a past medical history of MEN2, osteoporosis and post-surgical hypothyroidism. She has followed with [facility] since 07/15/2019 for consultative assistance with management of her MEN2A.  ? ? ? ? ? ? 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  ?  ?S/P B/L neck dissection 03/2010 ?S/P redo dissection of the right level VI L.N in 10/2012, largest 2 cm due to mets ?In 2015  Had a stable right lateral compartment level IV and V L.N . The cystic right level IV L.N previously biopsied 12/2010 negative for mets.  ?S/P thoracoscopy (08/2014) level IX L.N with medullary mets.  ?  ?  ?She has had diarrhea in 2010, started on Imodium for years. Somatostatin was not found to be effective.  ?  ?In 08/2018 was found to have an endometrial mass on MRI, further testing was benign.  ?  ?She used to follow up at [facility], serial testing has come back negative for pheo or hypercalcemia.  ?  ?  ?Over the years her calcitonin and CEA levels have been gradually increasing.  ?  ?She continues with chronic diarrhea, diarrhea has been there since 2010. Treatment was started some time in 2013. She has been on loperamide for years ? ?  ?Stool consistency has improved with consuming protein shakes.   ? ? ?She has been diagnosed with osteoporosis in 2016 and has been on Reclast since then. It was recommended by Hem/Onc to switch zoledrinic acid from annually to Q 3 month due to bone mets  ? ? ? ?G- tube 12/2020 ? ? ?She was tried on Cometriq 2022 but caused fatigue  ? ?SUBJECTIVE:  ? ? ?Today (01/17/2022):  Shannon [name] is here for f/u on  medullary carcinoma and MEN 2A ? ? ?Weight has been stable  ?She is currently on 3 feeds through G-tube  ? ?Chronic diarrhea has improved dramatically to once daily  with cholestyramine and Imodium are helping  ? ?Energy is stable  ?GERD has been improving  with the occasional break trough heartburn  ? ? ? ?She is on Zometa through Hem/Onc  ? ?She is on calcium BID - has not been taking due to dysphagia ? ? ?Vitamin D 1000 iu daily  ?Levothyroxine 125 mcg, 1 tab  daily  ? ? ? ? ?HISTORY:  ?Past Medical History:  ?Past Medical History:  ?Diagnosis Date  ? Anxiety   ? Breast cancer ([place]) 1999  ? left lumpectomy/radiation/ductal carcinoma in situ  ? Cancer [facility])   ? Colon polyp   ? Contact lens/glasses fitting   ? HAS LENS IMPLANTS  ? Lung cancer ([place]) 2015  ? LYMPHNODE REMOVAL   ? 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 Osteoporosis   ? ?Past Surgical History:  ?Past Surgical History:  ?Procedure Laterality Date  ? ABDOMINAL HYSTERECTOMY    ? ABDOMINAL SURGERY  1989  ? RUPTURED APPENDIX  ? BREAST SURGERY  1999  ? LEFT BREAST LUMPECTOMY. DUCTAL CARCINOMA IN SITU./ RADIATION  ? CATARACT EXTRACTION W/ INTRAOCULAR LENS IMPLANT  2009  ? BOTH EYES  ?  FEMUR IM NAIL Left 10/23/2017  ? Procedure: INTRAMEDULLARY (IM) NAIL FEMORAL;  Surgeon: Paralee Cancel, MD;  Location: [facility];  Service: Orthopedics;  Laterality: Left;  ? FOOT SURGERY  1070  ? RIGHT FOOT - PINCHED NERVE  ? IR GASTROSTOMY TUBE MOD SED  01/13/2021  ? LUNG SURGERY    ? CALCIFICATIONS, POSITIVE - CANCER LYMPHNODE   ? MELANOMA EXCISION  2008  ? CHEST AREA  ? NECK SURGERY  Nov 09 2011  ? LYMPHNODES REMOVED 2 POSITIVE   ? RIGHT COLECTOMY  06/02/2009  ? THYROIDECTOMY  11/06/2009  ? TONSILLECTOMY AND ADENOIDECTOMY    ? ?Social History:  reports that she quit smoking about 61 years ago. Her smoking use included cigarettes. She has never used smokeless tobacco. She reports that she does not drink alcohol and does not use drugs. ?Family History:  ?Family History  ?Problem  Relation Age of Onset  ? Cancer Father   ?     throat/ bone  ? Breast cancer Sister   ? Cancer Sister   ?     MEDULAR CANCER  ? Cancer Brother   ?     prostate  ? Cancer Other   ?     MEDULAR CANCER  ? Breast cancer Maternal Aunt   ? Cancer Paternal Aunt   ?     pancreatic   ? Cancer Cousin   ?     ON MOTHER'S SIDE- LIVER CANCER  ? ? ? ?HOME MEDICATIONS: ?Allergies as of 01/17/2022   ? ?   Reactions  ? Codeine Other (See Comments)  ? hyper ?Other reaction(s): insomnia  ? ?  ? ?  ?Medication List  ?  ? ?  ? Accurate as of January 17, 2022  8:00 AM. If you have any questions, ask your nurse or doctor.  ?  ?  ? ?  ? ?ALPRAZolam 1 MG tablet ?Commonly known as: Duanne Moron ?Take 0.5 tablets (0.5 mg total) by mouth 2 (two) times daily. ?  ?bacitracin ophthalmic ointment ?  ?cabozantinib s-malate capsule (3 x 87m kit) ?Commonly known as: COMETRIQ ?Take 60 mg by mouth daily. ?  ?CALCIUM 600 + MINERALS PO ?Take 1 tablet by mouth daily. ?  ?Calcium Carb-Cholecalciferol 600-200 MG-UNIT Tabs ?2 tablets ?  ?cetirizine 10 MG tablet ?Commonly known as: ZYRTEC ?Take 10 mg by mouth daily. ?  ?cholestyramine 4 g packet ?Commonly known as: QUESTRAN ?DISSOLVE 1 PACKET IN LIQUID OF CHOICE AND DRINK 2 TIMES A DAY ?  ?Cod Liver Oil 1000 MG Caps ?Take 1,000 mg by mouth daily. ?  ?Glucosamine-Chondroitin 250-200 MG Caps ?Take 1 tablet by mouth daily. Name of table is SCHIFF ?  ?levothyroxine 112 MCG tablet ?Commonly known as: SYNTHROID ?Take 1 tablet (112 mcg total) by mouth daily. ?  ?loperamide 2 MG tablet ?Commonly known as: IMODIUM A-D ?Take 2 mg by mouth 3 (three) times daily. ?  ?multivitamin tablet ?Take 1 tablet by mouth daily. ?  ?ondansetron 8 MG tablet ?Commonly known as: ZOFRAN ?Take 1 tablet by mouth every 8 hours as needed for nausea or vomiting. ?  ?pantoprazole 40 MG tablet ?Commonly known as: PROTONIX ?TAKE 1 TABLET BY MOUTH TWICE DAILY BEFORE A MEAL ?  ?simethicone 80 MG chewable tablet ?Commonly known as: MYLICON ?Chew 80 mg by  mouth 3 (three) times daily. ?  ?sucralfate 1 g tablet ?Commonly known as: CARAFATE ?TAKE 1 TABLET BY MOUTH 4 TIMES DAILY WITH MEALS AND AT BEDTIME ?  ?Tylenol PM  Extra Strength 25-500 MG Tabs tablet ?Generic drug: diphenhydramine-acetaminophen ?1-2 tabs ?  ?Vitamin D 1000 units capsule ?Take 1,000 Units by mouth daily. ?  ?vitamin E 180 MG (400 UNITS) capsule ?1 tablet ?  ?zoledronic acid 5 MG/100ML Soln injection ?Commonly known as: RECLAST ?Inject 5 mg into the vein once. ?  ? ?  ? ? ? ? ?OBJECTIVE:  ? ?PHYSICAL EXAM: ?VS: There were no vitals taken for this visit. ? ? ?EXAM: ?General: Pt appears well and is in NAD  ?Neck: General: Supple without adenopathy. ?Thyroid: Thyroid surgically removed, no adenopathy noted.   ?Lungs: Clear with good BS bilat with no rales, rhonchi, or wheezes  ?Heart: Auscultation: RRR.  ?Abdomen: Soft with epigastric discomfort   ?Extremities:  ?BL LE: No pretibial edema normal ROM and strength.  ?Mental Status: Judgment, insight: Intact ?Orientation: Oriented to time, place, and person ?Mood and affect: No depression, anxiety, or agitation  ? ? ? ?DATA REVIEWED: ? Latest Reference Range & Units 01/17/22 13:48  ?TSH 0.35 - 5.50 uIU/mL 16.19 (H)  ? ? ? ? ?07/26/2021 ?Total Volume mL 850  1,000  850   ?Epinephrine, 24H, Ur mcg/24 h see note  5 R, CM  see note CM   ?Comment: Results are below the reportable range for this  ?analyte, which is 2.0 mcg/L.  ?.  ?This test was developed and its analytical performance  ?characteristics have been determined by [facility]  ?[place], [place]. It has  ?not been cleared or approved by the U.S. Food and Drug  ?Administration. This assay has been validated pursuant  ?to the CLIA regulations and is used for clinical  ?purposes.  ?.   ?Norepinephrine, 24H, Ur 15 - 100 mcg/24 h 40  57 CM  38 CM   ?Comment: .  ?This test was developed and its analytical performance  ?characteristics have been determined by [facility]  ?[place], [place]. It has  ?not been cleared or approved by the U.S. Food and Drug  ?Administration. This assay has been validated pursuant  ?to the CLIA regulations and is used for clinical  ?purpo

## 2022-01-17 NOTE — Patient Instructions (Signed)

## 2022-01-18 ENCOUNTER — Telehealth: Payer: Self-pay | Admitting: Internal Medicine

## 2022-01-18 MED ORDER — LEVOTHYROXINE SODIUM 125 MCG PO TABS: 125.0000 ug | ORAL_TABLET | Freq: Every day | ORAL | 3 refills | Status: DC

## 2022-01-18 NOTE — Telephone Encounter (Signed)
Please let the patient know that her thyroid is underactive again, her TSH went from 2.5 to 16, this is too high ? ? ?She did assure me compliance during our office visit, my recommendation is to stop levothyroxine 112 and start levothyroxine 125 mcg daily ? ? ?Please ask the patient to take levothyroxine in the morning on an empty stomach, and wait an hour before starting PEG feeds ? ? ? ?We will need a lab test in 3 months ? ? ?Thanks ? ? ? ? ?

## 2022-01-18 NOTE — Telephone Encounter (Signed)
Called and advised pt her thyroid is underactive again, her TSH went from 2.5 to 16, this is too high ?Dr recommendation is to stop levothyroxine 112 and start levothyroxine 125 mcg daily ?Advised patient to take levothyroxine in the morning on an empty stomach, and wait an hour before starting PEG feeds ?Lab appt scheduled. ?

## 2022-01-23 LAB — CEA: CEA: 224.9 ng/mL — ABNORMAL HIGH

## 2022-01-23 LAB — CALCITONIN: Calcitonin: 26086 pg/mL — ABNORMAL HIGH (ref <=5)

## 2022-01-30 ENCOUNTER — Encounter: Payer: Self-pay | Admitting: Hematology

## 2022-01-31 ENCOUNTER — Encounter: Payer: Self-pay | Admitting: Internal Medicine

## 2022-02-28 ENCOUNTER — Other Ambulatory Visit (HOSPITAL_COMMUNITY): Payer: Self-pay | Admitting: Gastroenterology

## 2022-02-28 DIAGNOSIS — R627 Adult failure to thrive: Secondary | ICD-10-CM

## 2022-02-28 DIAGNOSIS — Z931 Gastrostomy status: Secondary | ICD-10-CM

## 2022-03-01 ENCOUNTER — Other Ambulatory Visit: Payer: Self-pay

## 2022-03-01 ENCOUNTER — Other Ambulatory Visit: Payer: Self-pay | Admitting: *Deleted

## 2022-03-01 DIAGNOSIS — C73 Malignant neoplasm of thyroid gland: Secondary | ICD-10-CM

## 2022-03-02 ENCOUNTER — Ambulatory Visit: Payer: Medicare Other | Admitting: Hematology

## 2022-03-02 ENCOUNTER — Inpatient Hospital Stay: Payer: Medicare Other

## 2022-03-02 ENCOUNTER — Ambulatory Visit: Payer: Medicare Other

## 2022-03-02 ENCOUNTER — Inpatient Hospital Stay: Payer: Medicare Other | Attending: Hematology

## 2022-03-02 ENCOUNTER — Inpatient Hospital Stay: Payer: Medicare Other | Admitting: Hematology

## 2022-03-02 ENCOUNTER — Other Ambulatory Visit: Payer: Medicare Other

## 2022-03-02 VITALS — BP 124/58 | HR 98 | Temp 97.8°F | Resp 15 | Ht 63.0 in | Wt 93.2 lb

## 2022-03-02 DIAGNOSIS — R197 Diarrhea, unspecified: Secondary | ICD-10-CM | POA: Insufficient documentation

## 2022-03-02 DIAGNOSIS — R5383 Other fatigue: Secondary | ICD-10-CM | POA: Insufficient documentation

## 2022-03-02 DIAGNOSIS — R1312 Dysphagia, oropharyngeal phase: Secondary | ICD-10-CM | POA: Diagnosis not present

## 2022-03-02 DIAGNOSIS — C7951 Secondary malignant neoplasm of bone: Secondary | ICD-10-CM | POA: Diagnosis not present

## 2022-03-02 DIAGNOSIS — R634 Abnormal weight loss: Secondary | ICD-10-CM | POA: Insufficient documentation

## 2022-03-02 DIAGNOSIS — C73 Malignant neoplasm of thyroid gland: Secondary | ICD-10-CM

## 2022-03-02 DIAGNOSIS — K635 Polyp of colon: Secondary | ICD-10-CM | POA: Diagnosis not present

## 2022-03-02 DIAGNOSIS — M810 Age-related osteoporosis without current pathological fracture: Secondary | ICD-10-CM | POA: Insufficient documentation

## 2022-03-02 DIAGNOSIS — R970 Elevated carcinoembryonic antigen [CEA]: Secondary | ICD-10-CM | POA: Diagnosis not present

## 2022-03-02 DIAGNOSIS — K219 Gastro-esophageal reflux disease without esophagitis: Secondary | ICD-10-CM | POA: Insufficient documentation

## 2022-03-02 LAB — CBC WITH DIFFERENTIAL (CANCER CENTER ONLY)
Abs Immature Granulocytes: 0.02 10*3/uL (ref 0.00–0.07)
Basophils Absolute: 0.1 10*3/uL (ref 0.0–0.1)
Basophils Relative: 1 %
Eosinophils Absolute: 0.1 10*3/uL (ref 0.0–0.5)
Eosinophils Relative: 1 %
HCT: 40.7 % (ref 36.0–46.0)
Hemoglobin: 13.3 g/dL (ref 12.0–15.0)
Immature Granulocytes: 0 %
Lymphocytes Relative: 9 %
Lymphs Abs: 0.6 10*3/uL — ABNORMAL LOW (ref 0.7–4.0)
MCH: 28.5 pg (ref 26.0–34.0)
MCHC: 32.7 g/dL (ref 30.0–36.0)
MCV: 87.2 fL (ref 80.0–100.0)
Monocytes Absolute: 0.5 10*3/uL (ref 0.1–1.0)
Monocytes Relative: 8 %
Neutro Abs: 5 10*3/uL (ref 1.7–7.7)
Neutrophils Relative %: 81 %
Platelet Count: 220 10*3/uL (ref 150–400)
RBC: 4.67 MIL/uL (ref 3.87–5.11)
RDW: 13.8 % (ref 11.5–15.5)
WBC Count: 6.2 10*3/uL (ref 4.0–10.5)
nRBC: 0 % (ref 0.0–0.2)

## 2022-03-02 LAB — BASIC METABOLIC PANEL - CANCER CENTER ONLY
Anion gap: 10 (ref 5–15)
BUN: 18 mg/dL (ref 8–23)
CO2: 27 mmol/L (ref 22–32)
Calcium: 9.2 mg/dL (ref 8.9–10.3)
Chloride: 101 mmol/L (ref 98–111)
Creatinine: 0.59 mg/dL (ref 0.44–1.00)
GFR, Estimated: >60 mL/min (ref >60)
Glucose, Bld: 134 mg/dL — ABNORMAL HIGH (ref 70–99)
Potassium: 4.2 mmol/L (ref 3.5–5.1)
Sodium: 138 mmol/L (ref 135–145)

## 2022-03-02 LAB — CEA (IN HOUSE-CHCC): CEA (CHCC-In House): 207.45 ng/mL — ABNORMAL HIGH (ref 0.00–5.00)

## 2022-03-02 MED ORDER — ZOLEDRONIC ACID 4 MG/100ML IV SOLN
4.0000 mg | Freq: Once | INTRAVENOUS | Status: AC
  Administered 2022-03-02: 4 mg via INTRAVENOUS
  Filled 2022-03-02: qty 100

## 2022-03-02 MED ORDER — SODIUM CHLORIDE 0.9 % IV SOLN: Freq: Once | INTRAVENOUS | Status: AC

## 2022-03-02 NOTE — Patient Instructions (Signed)

## 2022-03-03 ENCOUNTER — Telehealth: Payer: Self-pay | Admitting: Hematology

## 2022-03-03 NOTE — Telephone Encounter (Signed)
Scheduled follow-up appointment per 5/3 los. Patient is aware. ?

## 2022-03-08 ENCOUNTER — Other Ambulatory Visit (HOSPITAL_COMMUNITY): Payer: Self-pay | Admitting: Physician Assistant

## 2022-03-08 ENCOUNTER — Ambulatory Visit (HOSPITAL_COMMUNITY)
Admission: RE | Admit: 2022-03-08 | Discharge: 2022-03-08 | Disposition: A | Discharge status: Home / Self Care | Payer: Medicare Other | Source: Ambulatory Visit | Attending: Gastroenterology | Admitting: Gastroenterology

## 2022-03-08 ENCOUNTER — Ambulatory Visit (HOSPITAL_COMMUNITY)
Admission: RE | Admit: 2022-03-08 | Discharge: 2022-03-08 | Disposition: A | Discharge status: Home / Self Care | Payer: Medicare Other | Source: Ambulatory Visit | Attending: Physician Assistant | Admitting: Physician Assistant

## 2022-03-08 ENCOUNTER — Other Ambulatory Visit (HOSPITAL_COMMUNITY): Payer: Self-pay | Admitting: Gastroenterology

## 2022-03-08 DIAGNOSIS — Z931 Gastrostomy status: Secondary | ICD-10-CM

## 2022-03-08 DIAGNOSIS — R627 Adult failure to thrive: Secondary | ICD-10-CM

## 2022-03-08 DIAGNOSIS — Z431 Encounter for attention to gastrostomy: Secondary | ICD-10-CM | POA: Insufficient documentation

## 2022-03-08 HISTORY — PX: IR REPLACE G-TUBE SIMPLE WO FLUORO: IMG2323

## 2022-03-08 MED ORDER — LIDOCAINE VISCOUS HCL 2 % MT SOLN
OROMUCOSAL | Status: AC
  Administered 2022-03-08: 15 mL
  Filled 2022-03-08: qty 15

## 2022-03-08 MED ORDER — IOHEXOL 300 MG/ML  SOLN
100.0000 mL | Freq: Once | INTRAMUSCULAR | Status: AC | PRN
  Administered 2022-03-08: 10 mL

## 2022-03-08 MED ORDER — SILVER NITRATE-POT NITRATE 75-25 % EX MISC
3.0000 | CUTANEOUS | Status: DC
  Filled 2022-03-08: qty 10

## 2022-03-08 NOTE — Procedures (Signed)
?  Patient here for exchange of gastrostomy tube. ? ?Existing 20 fr pull through Gtube was removed and a new balloon retention Gtube was inserted. ? ?Silver nitrate used on hypergranulation tissue. ? ?Ok to eat and use tube tonight. ? ?Murrell Redden PA-C ?03/08/2022 ?3:10 PM ? ? ? ? ?

## 2022-03-08 NOTE — Progress Notes (Signed)
? ? ?HEMATOLOGY/ONCOLOGY CLINIC VISIT NOTE ? ?Date of Service: .03/02/2022 ? ? ?Patient Care Team: ?Amie Critchley as PCP - General (Family Medicine) ? ?CHIEF COMPLAINTS/PURPOSE OF CONSULTATION:  ?For continued evaluation and management of metastatic medullary thyroid cancer ? ?Oncology History from [place]  ? 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 total thyroidectomy in January of 2011 and bilateral neck dissection in June of 2011.  ?B. Persistent hypercalcitoninemia postoperatively. Calcitonin 499 in 07/2011  ?C. December 2012: CT of neck/chest was negative for metastases ?D. She had a normal neck ultrasound in 01/2012 showing her cystic LN was smaller (the one biopsied and negative).  ?E. FDG PET 08/2012 due to her last calcitonin raising substantially to 739 from the 500s: concerning right cervical lymph node ?F. 11/08/2012: Level 6 LN dissection. She had 2/2 LN positive, largest 2cm for MTC. Calcitonin one week later from her surgery was down to 515.  ?G. Calcitonin 592 on 05/24/2013. Repeat ultrasound on that same day which showed 2 at least partially cystic lymph nodes at level 4 which had been previously biopsied and negative; decreased in size since 2012. It also showed a subcentimeter right level VB LN of low suspicion. ?H. Her metanephrines (plasma) and PTH were normal in 12/2013 Urine studies for pheo were negative in 12/2013. ?I. 08/04/2014: CEA 9.0; calcitonin 1019 ?J. 09/05/2014:  ?- C/A/P CT scan: Interval development of a new calcified nodule in the left lower lobe. ?- Bone scan: Slight asymmetric increased radiotracer uptake in the right lateral calvarium on the frontal radiograph ?- Neck U/S: Stable right lateral compartment level 4 and 5B lymph nodes. The cystic right level 4 lymph node, previously biopsied on 01/14/2011 with negative pathology results. ? ?K. 09/23/2014: Thoracoscopy: level 9 LN: Metastatic medullary thyroid carcinoma.  ?L.  03/11/2015: CEA 15.9; calcitonin 1149 ?M 04-13-2015:  ?- C/A/P CT scan: Previously reported left lower lobe pulmonary nodule is no longer identified. No new or enlarging pulmonary nodules. No evidence of metastatic disease in the abdomen or pelvis. ?- CT brain:No acute intracranial abnormalities.   New small CSF density subdural fluid collection overlying the right cerebral hemisphere, likely a small hygroma of uncertain etiology. There is no associated bony or parenchymal lesion. 04-13-2015: Bone scan: A non-specific, very small focus of increased tracer activity in the right frontal calvarium is not significantly changed from the prior study. ?- Neck U/S: There is hypoechoic lesion with questionable microcalcification located in right level IV, it is 0.4 x 0.6 x 0.9 cm. No other pathologic cervical lymphadenopathy seen. ? ?N. 04/13/2015: CEA 14.6; calcitonin 1324 ?O. 07/16/2015 ([facility]): CEA 19.4 (outsode of [facility]); CAlcitonin 1860. ?P. 09-07-2015: Neck, Chest , Abd, [place] CT: Stable post-thyroidectomy changes without evidence of recurrent or ?metastatic disease. Stable pulmonary nodules. Hepatic cysts. No bone mets. CEA: 16; Calcitonin 1533 ?Q. 03-08-2016: C/A/P CT scan: No definite evidence of metastatic disease in the chest, abdomen or pelvis. Compared to 09/07/2015, there is a new linear 3 mm right lower lobe pulmonary nodule which is indeterminate and may represent focal atelectasis ?CEA 16.5; CAlcitonin 1839 ? ?R. 08-22-2016: Calcitonin: 4596; CEA 27.1 ? ?S. 09-06-2016:  ?- Bone scan: Redemonstrated likely two foci of increased radiotracer uptake within the right calvarium, similar to prior study. ?- Neck CT: No evidence of recurrent or metastatic disease in the neck. ?- C/A/P CT: No definite evidence of metastatic disease in the chest, abdomen, or pelvis. ?- Neck CT: Status post thyroidectomy and  lymph node dissection without evidence of recurrent or metastatic disease. ? ?T. Bone scan: A focus of increased tracer  activity projecting over the posterior right approximate 8th rib is non-specific; this finding may relate to summation of tracer activity in the rib with the inferior tip of the overlying right scapula, post-traumatic change or skeletal metastasis; recommend attention on follow-up. Re-demonstration of subtly increased tracer activity in the right frontal calvarium, stable since 09/05/2014. ?U. 02-13-2017:  ?- C/A/P CT scan: nonenlarged mediastinal lymph nodes and soft tissue nodule in the right thyroidectomy bed. Unchanged irregular opacities in the lung apices bilaterally, which may represent treated metastases or scarring.  ?- Neck CT: Numerous soft tissue enhancing lesions in the superior mediastinum. While these are stable when compared to most recent CT, several of these lesions demonstrate minimal growth from the more remote exam on 09/07/2015. No new enhancing lesions are present. ?- CEA 25.1, Calcitonin: 5498 ?V. 08-28-2017: fractionate plasma free metanephrines: Normetanephrine 178 (24hr urine pending as this was thought to be non-specific), Metanephrine: 44 (normal). CEA 46; Calcitonin 9763 ?W. 09-27-2017: Bone scan: Stable increased radiotracer activity in the right frontal calvarium, indeterminate. No other evidence of osseous metastasis ?Chest CT/Abd: Mildly enlarged mediastinal lymph nodes are stable. No evidence of metastatic disease in the abdomen ? ? ?HISTORY OF PRESENTING ILLNESS:  ? ?Shannon [name] is a wonderful 81 y.o. female who has been referred to [place] by [place] oncologist Dr. Leslie Andrea for management of Metastatic Medullary Thyroid Cancer. She is looking for a Financial controller in [place]. ? ?Today she is accompanied by her sister. Patient notes this started with a nodule in her throat and her endocrinologist started further work up. She has been seeing Med onc at [facility] since her first surgery every 3-6 months.  ? ?She has had thyroid surgery and Ldissection but no systemic  therapy as detailed above in the oncologic history. ? ?She notes her family had testing for MEN2A mutation. Her sister, her nephew carry the gene. She notes her maternal aunt had goiters but not sure of the cause.  ? ?She thinks her increasing calcitonin levels has increased her chronic diarrhea. She has had diarrhea since 2010. Her last colonoscopy was in 2010. She has tried multiple treatment options but this continues to worsen over time. She currently takes imodium 4-6 X daily, cholestyramine twice daily. She started monthly Sandostatin on 10/20/17. She fell and broke her hip on 10/23/17 and had surgery same day. She was given hydrocodone which helped her diarrhea and is not sure if the Sandostatin is working for her. She has been on oral iron and she is on aspirin 331m since her hip surgery. She does PT exercises and has been healing well.   ? ?She recently stopped caffeine tablets that she was taking for energy. She stopped 3 months ago. She is on Reclast for her osteoporosis, which is managed by her PCP, Dr. KDeatra Ina Pt notes she stopped smoking about 40 years ago and she rarely drinks alcohol.  ? ?On review of symptoms, pt notes continued diarrhea, stable energy, weight loss of 6-8 pounds in the last 6 months. Her appetite is adequate. Her diarrhea occurs currently twice a day with "pudding" form to liquid form stool. She can go up to 5-7 times a day at times. She denies cramping with diarrhea. She denies blood, and mucus in stool. Current use of oral iron causes some black stool, which was not present before use.  ? ?INTERVAL HISTORY: ? ?  Shannon [name] is here for her scheduled 98-monthfollow-up for metastatic medullary thyroid cancer.  She notes that her diarrhea is still well controlled with her current regimen of Imodium.  She has been maintaining reasonable tube feeding and limited p.o. intake due to her swallowing issues.  Her G-tube is old and she notes that gastroenterology is planning to replace  this. ?Notes continued similar fatigue with no acute changes.  No other acute new focal symptoms. ?Patient notes some nodularity in her neck. ?Continues to follow with endocrinology for management of thyroid

## 2022-03-09 ENCOUNTER — Encounter: Payer: Self-pay | Admitting: Hematology

## 2022-03-29 ENCOUNTER — Encounter: Payer: Self-pay | Admitting: Hematology

## 2022-03-30 ENCOUNTER — Inpatient Hospital Stay: Payer: Medicare Other | Admitting: Hematology

## 2022-03-30 NOTE — Progress Notes (Incomplete)
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 total thyroidectomy in January of 2011 and bilateral neck dissection in June of 2011.  B. Persistent hypercalcitoninemia postoperatively. Calcitonin 499 in 07/2011  C. December 2012: CT of neck/chest was negative for metastases D. She had a normal neck ultrasound in 01/2012 showing her cystic LN was smaller (the one biopsied and negative).  E. FDG PET 08/2012 due to her last calcitonin raising substantially to 739 from the 500s: concerning right cervical lymph node F. 11/08/2012: Level 6 LN dissection. She had 2/2 LN positive, largest 2cm for MTC. Calcitonin one week later from her surgery was down to 515.  G. Calcitonin 592 on 05/24/2013. Repeat ultrasound on that same day which showed 2 at least partially cystic lymph nodes at level 4 which had been previously biopsied and negative; decreased in size since 2012. It also showed a subcentimeter right level VB LN of low suspicion. H. Her metanephrines (plasma) and PTH were normal in 12/2013 Urine studies for pheo were negative in 12/2013. I. 08/04/2014: CEA 9.0; calcitonin 1019 J. 09/05/2014:  - C/A/P CT scan: Interval development of a new calcified nodule in the left lower lobe. - Bone scan: Slight asymmetric increased radiotracer uptake in the right lateral calvarium on the frontal radiograph - Neck U/S: Stable right lateral compartment level 4 and 5B lymph nodes. The cystic right level 4 lymph node, previously biopsied on 01/14/2011 with negative pathology results.  K. 09/23/2014: Thoracoscopy: level 9 LN: Metastatic medullary thyroid carcinoma.  L.  03/11/2015: CEA 15.9; calcitonin 1149 M 04-13-2015:  - C/A/P CT scan: Previously reported left lower lobe pulmonary nodule is no longer identified. No new or enlarging pulmonary nodules. No evidence of metastatic disease in the abdomen or pelvis. - CT brain:No acute intracranial abnormalities.   New small CSF density subdural fluid collection overlying the right cerebral hemisphere, likely a small hygroma of uncertain etiology. There is no associated bony or parenchymal lesion. 04-13-2015: Bone scan: A non-specific, very small focus of increased tracer activity in the right frontal calvarium is not significantly changed from the prior study. - Neck U/S: There is hypoechoic lesion with questionable microcalcification located in right level IV, it is 0.4 x 0.6 x 0.9 cm. No other pathologic cervical lymphadenopathy seen.  N. 04/13/2015: CEA 14.6; calcitonin 1324 O. 07/16/2015 ([facility]): CEA 19.4 (outsode of [facility]); CAlcitonin 1860. P. 09-07-2015: Neck, Chest , Abd, [place] CT: Stable post-thyroidectomy changes without evidence of recurrent or metastatic disease. Stable pulmonary nodules. Hepatic cysts. No bone mets. CEA: 16; Calcitonin 1533 Q. 03-08-2016: C/A/P CT scan: No definite evidence of metastatic disease in the chest, abdomen or pelvis. Compared to 09/07/2015, there is a new linear 3 mm right lower lobe pulmonary nodule which is indeterminate and may represent focal atelectasis CEA 16.5; CAlcitonin 1839  R. 08-22-2016: Calcitonin: 4596; CEA 27.1  S. 09-06-2016:  - Bone scan: Redemonstrated likely two foci of increased radiotracer uptake within the right calvarium, similar to prior study. - Neck CT: No evidence of recurrent or metastatic disease in the neck. - C/A/P CT: No definite evidence of metastatic disease in the chest, abdomen, or pelvis. - Neck CT: Status post thyroidectomy and  lymph node dissection without evidence of recurrent or metastatic disease.  T. Bone scan: A focus of increased tracer  activity projecting over the posterior right approximate 8th rib is non-specific; this finding may relate to summation of tracer activity in the rib with the inferior tip of the overlying right scapula, post-traumatic change or skeletal metastasis; recommend attention on follow-up. Re-demonstration of subtly increased tracer activity in the right frontal calvarium, stable since 09/05/2014. U. 02-13-2017:  - C/A/P CT scan: nonenlarged mediastinal lymph nodes and soft tissue nodule in the right thyroidectomy bed. Unchanged irregular opacities in the lung apices bilaterally, which may represent treated metastases or scarring.  - Neck CT: Numerous soft tissue enhancing lesions in the superior mediastinum. While these are stable when compared to most recent CT, several of these lesions demonstrate minimal growth from the more remote exam on 09/07/2015. No new enhancing lesions are present. - CEA 25.1, Calcitonin: 5498 V. 08-28-2017: fractionate plasma free metanephrines: Normetanephrine 178 (24hr urine pending as this was thought to be non-specific), Metanephrine: 44 (normal). CEA 46; Calcitonin 9763 W. 09-27-2017: Bone scan: Stable increased radiotracer activity in the right frontal calvarium, indeterminate. No other evidence of osseous metastasis Chest CT/Abd: Mildly enlarged mediastinal lymph nodes are stable. No evidence of metastatic disease in the abdomen   HISTORY OF PRESENTING ILLNESS:  Please see previous note for details on initial presentation  INTERVAL HISTORY: I connected with Shannon [name] on 03/30/2022 at 8:40 AM EST by telephone visit and verified that I am speaking with the correct person using two identifiers.   I discussed the limitations, risks, security and privacy concerns of performing an evaluation and management service by telemedicine and the availability of in-person appointments. I also discussed with the patient that there may be a patient responsible charge related to this  service. The patient expressed understanding and agreed to proceed.   Other persons participating in the visit and their role in the encounter: None  Patient's location: Home Provider's location: [facility]  Shannon [name] is a 81 y.o. female who was contacted via phone for continued evaluation and management metastatic medullary thyroid cancer. She reports She is doing well with no new symptoms or concerns.  ***  She notes that her diarrhea is still well controlled with her current regimen of Imodium.  She has been maintaining reasonable tube feeding and limited p.o. intake due to her swallowing issues.  Notes continued similar fatigue with no acute changes.  No other acute new focal symptoms.  Continues to follow with endocrinology for management of thyroid replacement.  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 DUCTAL CARCINOMA IN SITU./ RADIATION   CATARACT EXTRACTION W/ INTRAOCULAR LENS IMPLANT  2009   BOTH EYES   FEMUR IM NAIL Left 10/23/2017   Procedure: INTRAMEDULLARY (IM) NAIL FEMORAL;  Surgeon: Paralee Cancel, MD;  Location: [facility];  Service: Orthopedics;  Laterality: Left;   FOOT SURGERY  1070   RIGHT FOOT - PINCHED NERVE   IR GASTROSTOMY TUBE MOD SED  01/13/2021   IR REPLACE G-TUBE SIMPLE WO FLUORO  03/08/2022   LUNG SURGERY     CALCIFICATIONS, POSITIVE - CANCER LYMPHNODE    MELANOMA EXCISION  2008    CHEST AREA   NECK SURGERY  Nov 09 2011   LYMPHNODES REMOVED 2 POSITIVE    RIGHT COLECTOMY  06/02/2009   THYROIDECTOMY  11/06/2009   TONSILLECTOMY AND ADENOIDECTOMY      SOCIAL HISTORY: Social History   Socioeconomic History   Marital status: Married    Spouse name: Not on file   Number of children: Not on file   Years of education: Not on file   Highest education level: Not on file  Occupational History   Not on file  Tobacco Use   Smoking status: Former    Types: Cigarettes    Quit date: 08/10/1960    Years since quitting: 61.6   Smokeless tobacco: Never  Vaping Use   Vaping Use: Never used  Substance and Sexual Activity   Alcohol use: No    Alcohol/week: 0.0 standard drinks   Drug use: No   Sexual activity: Never  Other Topics Concern   Not on file  Social History Narrative   Not on file   Social Determinants of Health   Financial Resource Strain: Not on file  Food Insecurity: Not on file  Transportation Needs: Not on file  Physical Activity: Not on file  Stress: Not on file  Social Connections: Not on file  Intimate Partner Violence: Not on file    FAMILY HISTORY: Family History  Problem Relation Age of Onset   Cancer Father        throat/ bone   Breast cancer Sister    Cancer Sister        MEDULAR CANCER   Cancer Brother        prostate   Cancer Other        MEDULAR CANCER   Breast cancer Maternal Aunt    Cancer Paternal Aunt        pancreatic    Cancer Cousin        ON MOTHER'S SIDE- LIVER CANCER    ALLERGIES:  is allergic to codeine.  MEDICATIONS:  Current Outpatient Medications  Medication Sig Dispense Refill   ALPRAZolam (XANAX) 1 MG tablet Take 0.5 tablets (0.5 mg total) by mouth 2 (two) times daily. 6 tablet 0   bacitracin ophthalmic ointment      cabozantinib s-malate (COMETRIQ) capsule (3 x 49m kit) Take 60 mg by mouth daily. 1 kit 1   Calcium Carb-Cholecalciferol 600-200 MG-UNIT TABS 2 tablets     Calcium Carbonate-Vit D-Min  (CALCIUM 600 + MINERALS PO) Take 1 tablet by mouth daily.     cetirizine (ZYRTEC) 10 MG tablet Take 10 mg by mouth daily.     Cholecalciferol (VITAMIN D) 1000 UNITS capsule Take 1,000 Units by mouth daily.     cholestyramine (QUESTRAN) 4 g packet DISSOLVE 1 PACKET IN LIQUID OF CHOICE AND DRINK 2 TIMES A DAY 60 packet 5   Cod Liver Oil 1000 MG CAPS Take 1,000 mg by mouth daily.     diphenhydramine-acetaminophen (TYLENOL PM EXTRA STRENGTH) 25-500 MG TABS tablet 1-2 tabs     Glucosamine-Chondroitin 250-200 MG CAPS Take 1 tablet by mouth daily. Name of table is SCHIFF  levothyroxine (SYNTHROID) 125 MCG tablet Take 1 tablet (125 mcg total) by mouth daily. 90 tablet 3   loperamide (IMODIUM A-D) 2 MG tablet Take 2 mg by mouth 3 (three) times daily.     Multiple Vitamin (MULTIVITAMIN) tablet Take 1 tablet by mouth daily.     ondansetron (ZOFRAN) 8 MG tablet Take 1 tablet by mouth every 8 hours as needed for nausea or vomiting. 30 tablet 0   pantoprazole (PROTONIX) 40 MG tablet TAKE 1 TABLET BY MOUTH TWICE DAILY BEFORE A MEAL 60 tablet 0   simethicone (MYLICON) 80 MG chewable tablet Chew 80 mg by mouth 3 (three) times daily.     sucralfate (CARAFATE) 1 g tablet TAKE 1 TABLET BY MOUTH 4 TIMES DAILY WITH MEALS AND AT BEDTIME 420 tablet 0   vitamin E 180 MG (400 UNITS) capsule 1 tablet     zoledronic acid (RECLAST) 5 MG/100ML SOLN injection Inject 5 mg into the vein once.     No current facility-administered medications for this visit.    REVIEW OF SYSTEMS:   10 Point review of Systems was done is negative except as noted above.  PHYSICAL EXAMINATION: Telemedicine appointment  LABORATORY DATA:  I have reviewed the data as listed     Latest Ref Rng & Units 03/02/2022   12:06 PM 11/03/2021    1:01 PM 10/12/2021    2:39 PM  CBC  WBC 4.0 - 10.5 K/uL 6.2   4.8   4.7    Hemoglobin 12.0 - 15.0 g/dL 13.3   12.9   11.8    Hematocrit 36.0 - 46.0 % 40.7   38.8   36.0    Platelets 150 - 400 K/uL 220    217   239     .    Latest Ref Rng & Units 03/02/2022   12:06 PM 11/03/2021    1:01 PM 10/12/2021    2:39 PM  CMP  Glucose 70 - 99 mg/dL 134   93   95    BUN 8 - 23 mg/dL 18   18   18     Creatinine 0.44 - 1.00 mg/dL 0.59   0.60   0.73    Sodium 135 - 145 mmol/L 138   139   142    Potassium 3.5 - 5.1 mmol/L 4.2   4.4   4.3    Chloride 98 - 111 mmol/L 101   101   103    CO2 22 - 32 mmol/L 27   33   28    Calcium 8.9 - 10.3 mg/dL 9.2   9.5   9.1    Total Protein 6.5 - 8.1 g/dL  7.1   6.7    Total Bilirubin 0.3 - 1.2 mg/dL  0.8   0.7    Alkaline Phos 38 - 126 U/L  82   86    AST 15 - 41 U/L  20   20    ALT 0 - 44 U/L  14   15          Component     Latest Ref Rng & Units 09/22/2021  T4,Free(Direct)     0.61 - 1.12 ng/dL 1.78 (H)  TSH     0.308 - 3.960 uIU/mL 2.572  Phosphorus     2.5 - 4.6 mg/dL 3.2  Magnesium     1.7 - 2.4 mg/dL 1.9   PATHOLOGY  Lung Biopsy at [facility] 09/23/14 DIAGNOSIS A. Pleural nodule, biopsy:  Calcified and  hyalinized remote fat necrosis. No evidence of malignancy.   B. Level 9 lymph node, biopsy:  Metastatic medullary thyroid carcinoma. See comment.  Comment:: A note is made of the patient's history of medullary thyroid carcinoma with documented prior metastatic disease to cervical lymph nodes ([id number]; 04/05/2010).  The prior metastases to cervical lymph nodes are reviewed in conjunction to the current case and the tumors are morphologically similar.   Confirmatory immunohistochemical stains are performed. Tumor cells stain positively for calcitonin and for an anticytokeratin cocktail. The morphologic and immunophenotypic findings support metastatic medullary thyroid carcinoma.   C. Level 8 lymph node, biopsy:  Profiles of lymph nodes, negative for malignancy.   D. Pleural nodule #2, biopsy:  Fibrovascular tissue with extensive cauterization. No evidence of malignancy.        Surgical Pathology at [facility] 11/08/12 Immunohistochemical  Findings An immunohistochemical stain for calcitonin obtained on paraffin block A1 is positive.    Diagnosis A. "RIGHT LEVEL 6 LYMPH NODES" (BIOPSY):      METASTATIC MEDULLARY THYROID CARCINOMA IN TWO LYMPH NODES (2/2).     SIZE OF LARGEST METASTASIS:  2 CM.     EXTRANODAL INVASION:  PRESENT. Comment:  I certify that I personally conducted the diagnostic evaluation of the above  specimen(s) and have rendered the above diagnosis(es).                             Rex C. Telford Nab, M.D. [phone]                             Electronically signed: 11/16/12   A. "Right level 6 lymph nodes", received fresh and placed in formalin on 1/9/ two fragments of tan-red soft tissue.  Fragment 1 is 1.3 x 1 x 0.6 cm and is bisected and submitted in block A1.  Fragment 2 is 2 x 1 x 0.4 cm and is submitted entirely in block A2.    RADIOGRAPHIC STUDIES: I have personally reviewed the radiological images as listed and agreed with the findings in the report. IR REPLACE G-TUBE SIMPLE WO FLUORO  Result Date: 03/08/2022 INDICATION: Malfunctioning gastrostomy tube with request for exchange. Initially placed on January 13, 2021 by Dr. Pascal Lux. EXAM: BEDSIDE EXCHANGE OF GASTROSTOMY TUBE MEDICATIONS: Viscous Lidocaine COMPLICATIONS: None immediate. PROCEDURE: Informed written consent was obtained from the patient after a thorough discussion of the procedural risks, benefits and alternatives. All questions were addressed. A timeout was performed prior to the initiation of the procedure. Viscous lidocaine was used to locally anesthetize the tract. Using traction, the gastrostomy tube was removed intact. I then inserted a 28 French balloon retention gastrostomy tube and inflated the balloon with 8 mL of water. On exam the patient had marked hypergranulation tissue at the stoma. I used silver nitrate to cauterize the granulation tissue. IMPRESSION: Successful exchange of gastrostomy tube with replacement of a 20 French  balloon retention gastrostomy tube. Procedure performed by: Gareth Eagle, PA-C Electronically Signed   By: Corrie Mckusick D.O.   On: 03/08/2022 16:42   DG ABDOMEN PEG TUBE LOCATION  Result Date: 03/08/2022 CLINICAL DATA:  Evaluate gastrostomy tube. EXAM: ABDOMEN - 1 VIEW COMPARISON:  Image guided gastrostomy tube placement-01/13/2021; chest CT-06/22/2020 FINDINGS: Contrast injection via the balloon retention gastrostomy tube demonstrates opacification of the gastric lumen. Paucity of bowel gas without evidence of enteric obstruction. Unremarkable colonic stool burden. No pneumoperitoneum, pneumatosis or portal venous gas  Limited visualization of the lower thorax redemonstrates radiopaque material overlying the left lower lung, likely aspirated material, similar to remote chest CT performed in 2021. No acute osseous abnormalities. IMPRESSION: Contrast injection demonstrates apparent appropriate positioning of balloon retention gastrostomy tube. Electronically Signed   By: Sandi Mariscal M.D.   On: 03/08/2022 16:46      CT Chest w Contrast w 3D MIPS Protocol at [facility] 09/29/17 Impression: 1.  Mildly enlarged mediastinal lymph nodes are stable when compared to most recent CT. Although, several nodes are minimally increased from 03/08/2016.  2.  Refer to separately dictated abdominal CT report for findings below the diaphragm.  Bone Scan Whole Body at [facility] 09/27/17 Impression: 1. Stable foci of increased radiotracer activity in the right frontal calvarium, indeterminate. 2. No new scintigraphic evidence of osseous metastasis.  US Lymph Node Neck Mapping at [facility] 08/29/17 Impression: 1.  Newly measured right level 5 lymph node (lymph node #5) with abnormal rounded morphology, no echogenic hilum, and microcalcifications. Although biopsy of this nodule may be technically difficult due to its location deep to the carotid vasculature, ultrasound-guided biopsy is recommended (if possible).  2.  Newly measured  right level 2A lymph node without suspicious features.  3.  Additional right neck nodes are unchanged.   Bone Density Scan 02/27/17 @ [facility]  Lowest Site Measured: Total Left hip with T-score of -3.1 and BMD of 0.568   CT CAP W Contrast W MIPS at [facility] 02/13/17 Impression: 1. Please see CT neck completed same day for findings in the neck. 2. Stable nonenlarged mediastinal lymph nodes and soft tissue nodule in the right thyroidectomy bed. 3. Unchanged irregular opacities in the lung apices bilaterally, which may represent treated metastases or scarring. No new metastatic disease in the chest, abdomen, or pelvis.  CT Nect Soft Tissue W Contrast at [facility] 02/13/17 IMPRESSION: Numerous soft tissue enhancing lesions in the superior mediastinum. While these are stable when compared to most recent CT, several of these lesions demonstrate minimal growth from the more remote exam on 09/07/2015. No new enhancing lesions are present.  ASSESSMENT & PLAN:   Shannon [name] is a 81 y.o. caucasian female with   1. 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  She had a level 6 LN dissection on 11/08/12 with 2/2 LN positive, largest 2cm for MTC.  09/05/14 CAP CT with development of new calcified nodules in the left lower lobe 09/23/14 Lung biopsy showed Level 9 lymph node, positive for malignancy and metastatic cancer which was resected. Treatment for symptomatic disease with vandetanib and cabozantibnib were previously discussed with her 54 Oncologist but the patient had decided to hold off on treatment due to concerns for toxicities and lack of overt cancer related progressive symptoms. Latest scans from 08/2017 are stable, no disease found below chest or in the bones. Her calcitonin levels have been increasing since 2015. Overall pt  currently asymptomatic.   CT chest on 10/17/2018 which revealed "1. Unchanged mediastinal and right supraclavicular lymphadenopathy. Findings are nonspecific, however metastatic disease is not excluded. 2. Stable biapical scarring and right apical consolidative focus with associated calcification, likely postinfectious in nature."  10/11/2018 CT angiography chest w contrast revealed "1. No demonstrable pulmonary embolus. No thoracic aortic aneurysm or dissection. There are foci of aortic atherosclerosis as well as great vessel and coronary artery calcifications. 2. Fibrosis and cicatrization in  the apices with apical pleural calcification, likely due to scarring. There is focal airspace opacity which is asymmetric in the apical segment of the right upper lobe. This finding may well be due to chronic scarring. A potential focus of pneumonia or possible neoplastic involvement in this area must be of concern. This finding may well warrant correlation with nuclear medicine PET study to assess for abnormal metabolic activity. At a minimum, a follow-up chest CT in 3 months to assess for stability of this area would be advised. No other airspace consolidation noted. Areas of patchy atelectasis noted. No well-defined nodular type lesions noted in the lung parenchyma. 3. Probable sclerotic bony metastases at C3 and L1. Sclerotic focus at L1 is incompletely visualized. Medullary thyroid carcinoma as well as breast carcinoma potentially may present with sclerotic as opposed to lytic metastases. 4.  No demonstrable thoracic adenopathy. 5. Extensive postoperative change in the thyroid region with evidence of total thyroidectomy. 6. Mild reflux into the inferior vena cava and hepatic veins suggests increase in right heart pressure. Aortic Atherosclerosis (ICD10-I70.0)."  12/06/2019 CT C/A/P ([id number]) ([id number]) revealed "1. Stable exam. No new or progressive findings to suggest progressive metastatic disease. 2.  Scattered sclerotic bone lesions, similar to prior. 3. Stable mild intrahepatic and extrahepatic biliary duct dilatation. 4. Trace free fluid in the cul-de-sac."  12/06/2019 CT Soft Tissue Neck ([id number]) revealed "1. Thyroidectomy with nodal metastases at the thoracic inlet/anterior compartment and right supraclavicular fossa. There are no prior neck CT comparison is available for review. 2. Spinal osseous metastatic disease without visible extraosseous tumor extension."  06/22/2020 CT C/A/P ([id number]) ([id number]) revealed "1. Borderline enlarged supraclavicular and mediastinal lymph nodes, stable. 2. Osseous metastatic disease, stable. 3. Left lower lobe aspiration and trace left pleural fluid. 4. Hepatomegaly. 5. Aortic atherosclerosis."  2. 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  Grade 3 fatigue related to the Cometriq at 60 mg p.o. daily-has been off Cometriq since 09/22/2021  3. Osteoporosis - Last DEXA 02/27/17 with Lowest Site Measured: Total Left hip with T-score of -3.1 and BMD of 0.568 Closed left hip fracture from fall -s/p surgery on 10/23/17 with rod placement - healing well.  Continue osteoporosis management with Zometa  4. Chronic oropharyngeal Dysphagia ? Related to previous neck surgery. Dry mouth etc. No acute changes  -Continue tube feeding  PLAN:  -Recent labs were reviewed with her in detail.  Evidence of disease progression based of increasing CEA and calcitonin levels.*** -Diarrhea is well controlled with as needed Imodium. -She continues with sucralfate and PPI twice daily to try to control her acid reflux.. -She notes her tube feeding tube is noted to be problematic and is  going to be replaced by GI. Continue to follow-up with endocrinology for continued management and replacement of levothyroxine. -Head CT scan in 2 to 3 weeks to evaluate for disease progression.***  FOLLOW UP: ***  The total time spent in the appointment was *** minutes*.  All of the patient's questions were answered with apparent satisfaction. The patient knows to call the clinic with any problems, questions or concerns.   Sullivan Lone MD MS AAHIVMS [facility] Hematology/Oncology Physician [facility]  .*Total Encounter Time as defined by the Centers for Medicare and  Medicaid Services includes, in addition to the face-to-face time of a patient visit (documented in the note above) non-face-to-face time: obtaining and reviewing outside history, ordering and reviewing medications, tests or procedures, care coordination (communications with other health care professionals or caregivers) and documentation in the medical record.  I, Melene Muller, am acting as scribe for Dr. Sullivan Lone, MD.

## 2022-03-31 ENCOUNTER — Other Ambulatory Visit: Payer: Self-pay | Admitting: Hematology

## 2022-03-31 DIAGNOSIS — C73 Malignant neoplasm of thyroid gland: Secondary | ICD-10-CM

## 2022-04-01 ENCOUNTER — Other Ambulatory Visit (INDEPENDENT_AMBULATORY_CARE_PROVIDER_SITE_OTHER): Payer: Medicare Other

## 2022-04-01 DIAGNOSIS — E890 Postprocedural hypothyroidism: Secondary | ICD-10-CM | POA: Diagnosis not present

## 2022-04-01 LAB — TSH: TSH: 1.1 u[IU]/mL (ref 0.35–5.50)

## 2022-04-04 ENCOUNTER — Encounter: Payer: Self-pay | Admitting: Internal Medicine

## 2022-04-06 ENCOUNTER — Encounter (HOSPITAL_COMMUNITY)
Admission: RE | Admit: 2022-04-06 | Discharge: 2022-04-06 | Disposition: A | Discharge status: Home / Self Care | Payer: Medicare Other | Source: Ambulatory Visit | Attending: Hematology | Admitting: Hematology

## 2022-04-06 DIAGNOSIS — C73 Malignant neoplasm of thyroid gland: Secondary | ICD-10-CM | POA: Diagnosis not present

## 2022-04-06 LAB — GLUCOSE, CAPILLARY: Glucose-Capillary: 110 mg/dL — ABNORMAL HIGH (ref 70–99)

## 2022-04-06 MED ORDER — FLUDEOXYGLUCOSE F - 18 (FDG) INJECTION
5.0200 | Freq: Once | INTRAVENOUS | Status: AC
  Administered 2022-04-06: 5.02 via INTRAVENOUS

## 2022-04-11 ENCOUNTER — Inpatient Hospital Stay: Payer: Medicare Other | Attending: Hematology | Admitting: Hematology

## 2022-04-11 DIAGNOSIS — R5383 Other fatigue: Secondary | ICD-10-CM | POA: Insufficient documentation

## 2022-04-11 DIAGNOSIS — C7951 Secondary malignant neoplasm of bone: Secondary | ICD-10-CM | POA: Insufficient documentation

## 2022-04-11 DIAGNOSIS — R197 Diarrhea, unspecified: Secondary | ICD-10-CM | POA: Diagnosis not present

## 2022-04-11 DIAGNOSIS — C73 Malignant neoplasm of thyroid gland: Secondary | ICD-10-CM | POA: Diagnosis present

## 2022-04-11 DIAGNOSIS — C7800 Secondary malignant neoplasm of unspecified lung: Secondary | ICD-10-CM | POA: Insufficient documentation

## 2022-04-11 DIAGNOSIS — M810 Age-related osteoporosis without current pathological fracture: Secondary | ICD-10-CM | POA: Diagnosis not present

## 2022-04-11 DIAGNOSIS — K529 Noninfective gastroenteritis and colitis, unspecified: Secondary | ICD-10-CM | POA: Diagnosis not present

## 2022-04-11 DIAGNOSIS — R1312 Dysphagia, oropharyngeal phase: Secondary | ICD-10-CM | POA: Insufficient documentation

## 2022-04-11 NOTE — Progress Notes (Signed)
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 total thyroidectomy in January of 2011 and bilateral neck dissection in June of 2011.  B. Persistent hypercalcitoninemia postoperatively. Calcitonin 499 in 07/2011  C. December 2012: CT of neck/chest was negative for metastases D. She had a normal neck ultrasound in 01/2012 showing her cystic LN was smaller (the one biopsied and negative).  E. FDG PET 08/2012 due to her last calcitonin raising substantially to 739 from the 500s: concerning right cervical lymph node F. 11/08/2012: Level 6 LN dissection. She had 2/2 LN positive, largest 2cm for MTC. Calcitonin one week later from her surgery was down to 515.  G. Calcitonin 592 on 05/24/2013. Repeat ultrasound on that same day which showed 2 at least partially cystic lymph nodes at level 4 which had been previously biopsied and negative; decreased in size since 2012. It also showed a subcentimeter right level VB LN of low suspicion. H. Her metanephrines (plasma) and PTH were normal in 12/2013 Urine studies for pheo were negative in 12/2013. I. 08/04/2014: CEA 9.0; calcitonin 1019 J. 09/05/2014:  - C/A/P CT scan: Interval development of a new calcified nodule in the left lower lobe. - Bone scan: Slight asymmetric increased radiotracer uptake in the right lateral calvarium on the frontal radiograph - Neck U/S: Stable right lateral compartment level 4 and 5B lymph nodes. The cystic right level 4 lymph node, previously biopsied on 01/14/2011 with negative pathology results.  K. 09/23/2014: Thoracoscopy: level 9 LN: Metastatic medullary thyroid carcinoma.  L.  03/11/2015: CEA 15.9; calcitonin 1149 M 04-13-2015:  - C/A/P CT scan: Previously reported left lower lobe pulmonary nodule is no longer identified. No new or enlarging pulmonary nodules. No evidence of metastatic disease in the abdomen or pelvis. - CT brain:No acute intracranial abnormalities.   New small CSF density subdural fluid collection overlying the right cerebral hemisphere, likely a small hygroma of uncertain etiology. There is no associated bony or parenchymal lesion. 04-13-2015: Bone scan: A non-specific, very small focus of increased tracer activity in the right frontal calvarium is not significantly changed from the prior study. - Neck U/S: There is hypoechoic lesion with questionable microcalcification located in right level IV, it is 0.4 x 0.6 x 0.9 cm. No other pathologic cervical lymphadenopathy seen.  N. 04/13/2015: CEA 14.6; calcitonin 1324 O. 07/16/2015 ([facility]): CEA 19.4 (outsode of [facility]); CAlcitonin 1860. P. 09-07-2015: Neck, Chest , Abd, [place] CT: Stable post-thyroidectomy changes without evidence of recurrent or metastatic disease. Stable pulmonary nodules. Hepatic cysts. No bone mets. CEA: 16; Calcitonin 1533 Q. 03-08-2016: C/A/P CT scan: No definite evidence of metastatic disease in the chest, abdomen or pelvis. Compared to 09/07/2015, there is a new linear 3 mm right lower lobe pulmonary nodule which is indeterminate and may represent focal atelectasis CEA 16.5; CAlcitonin 1839  R. 08-22-2016: Calcitonin: 4596; CEA 27.1  S. 09-06-2016:  - Bone scan: Redemonstrated likely two foci of increased radiotracer uptake within the right calvarium, similar to prior study. - Neck CT: No evidence of recurrent or metastatic disease in the neck. - C/A/P CT: No definite evidence of metastatic disease in the chest, abdomen, or pelvis. - Neck CT: Status post thyroidectomy and  lymph node dissection without evidence of recurrent or metastatic disease.  T. Bone scan: A focus of increased tracer  activity projecting over the posterior right approximate 8th rib is non-specific; this finding may relate to summation of tracer activity in the rib with the inferior tip of the overlying right scapula, post-traumatic change or skeletal metastasis; recommend attention on follow-up. Re-demonstration of subtly increased tracer activity in the right frontal calvarium, stable since 09/05/2014. U. 02-13-2017:  - C/A/P CT scan: nonenlarged mediastinal lymph nodes and soft tissue nodule in the right thyroidectomy bed. Unchanged irregular opacities in the lung apices bilaterally, which may represent treated metastases or scarring.  - Neck CT: Numerous soft tissue enhancing lesions in the superior mediastinum. While these are stable when compared to most recent CT, several of these lesions demonstrate minimal growth from the more remote exam on 09/07/2015. No new enhancing lesions are present. - CEA 25.1, Calcitonin: 5498 V. 08-28-2017: fractionate plasma free metanephrines: Normetanephrine 178 (24hr urine pending as this was thought to be non-specific), Metanephrine: 44 (normal). CEA 46; Calcitonin 9763 W. 09-27-2017: Bone scan: Stable increased radiotracer activity in the right frontal calvarium, indeterminate. No other evidence of osseous metastasis Chest CT/Abd: Mildly enlarged mediastinal lymph nodes are stable. No evidence of metastatic disease in the abdomen   HISTORY OF PRESENTING ILLNESS:  Shannon [name] is a wonderful 81 y.o. female who has been referred to [place] by [place] oncologist Dr. Leslie Andrea for management of Metastatic Medullary Thyroid Cancer. She is looking for a Financial controller in [place].  Today she is accompanied by her sister. Patient notes this started with a nodule in her throat and her endocrinologist started further work up. She has been seeing Med onc at [facility] since her first surgery every 3-6 months.   She has had thyroid surgery and Ldissection but no systemic  therapy as detailed above in the oncologic history.  She notes her family had testing for MEN2A mutation. Her sister, her nephew carry the gene. She notes her maternal aunt had goiters but not sure of the cause.   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 She has had diarrhea since 2010. Her last colonoscopy was in 2010. She has tried multiple treatment options but this continues to worsen over time. She currently takes imodium 4-6 X daily, cholestyramine twice daily. She started monthly Sandostatin on 10/20/17. She fell and broke her hip on 10/23/17 and had surgery same day. She was given hydrocodone which helped her diarrhea and is not sure if the Sandostatin is working for her. She has been on oral iron and she is on aspirin 321m since her hip surgery. She does PT exercises and has been healing well.    She recently stopped caffeine tablets that she was taking for energy. She stopped 3 months ago. She is on Reclast for her osteoporosis, which is managed by her PCP, Dr. KDeatra Ina Pt notes she stopped smoking about 40 years ago and she rarely drinks alcohol.   On review of symptoms, pt notes continued diarrhea, stable energy, weight loss of 6-8 pounds in the last 6 months. Her appetite is adequate. Her diarrhea occurs currently twice a day with "pudding" form to liquid form stool. She can go up to 5-7 times a day at times. She denies cramping with diarrhea. She denies blood, and mucus in stool. Current use of oral iron causes some black stool, which was not present before use.   INTERVAL HISTORY: I connected  with Berkley Harvey on 04/11/2022 at 8:40 AM EST by telephone visit and verified that I am speaking with the correct person using two identifiers.    I discussed the limitations, risks, security and privacy concerns of performing an evaluation and management service by telemedicine and the availability of in-person appointments. I also discussed with the patient  that there may be a patient responsible charge related to this service. The patient expressed understanding and agreed to proceed.    Other persons participating in the visit and their role in the encounter: None   Patient's location: Home Provider's location: [facility]   Mrs .DAYZHA POGOSYAN is a 81 y.o. female who was contacted via phone for continued evaluation and management metastatic medullary thyroid cancer. She reports She is doing well with no new symptoms or concerns.   No significant clinical symptoms suggestive of disease progression at this time.  Her weight is stable and well maintained with tube feeding at this time.   Notes continued similar fatigue with no acute changes. No other acute new focal symptoms.   Recent labs were reviewed with her in detail.  PET CT scan done 04/06/2022 revealed no significant signs of new or progressive disease at this time.  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 DUCTAL CARCINOMA IN SITU./ RADIATION   CATARACT EXTRACTION W/ INTRAOCULAR LENS IMPLANT  2009   BOTH EYES   FEMUR IM NAIL Left 10/23/2017   Procedure: INTRAMEDULLARY (IM) NAIL FEMORAL;  Surgeon: Paralee Cancel, MD;  Location: [place];  Service: Orthopedics;  Laterality: Left;   FOOT SURGERY  1070   RIGHT FOOT - PINCHED NERVE   IR GASTROSTOMY TUBE MOD SED  01/13/2021   IR REPLACE G-TUBE SIMPLE WO FLUORO  03/08/2022   LUNG SURGERY     CALCIFICATIONS, POSITIVE - CANCER  LYMPHNODE    MELANOMA EXCISION  2008   CHEST AREA   NECK SURGERY  Nov 09 2011   LYMPHNODES REMOVED 2 POSITIVE    RIGHT COLECTOMY  06/02/2009   THYROIDECTOMY  11/06/2009   TONSILLECTOMY AND ADENOIDECTOMY      SOCIAL HISTORY: Social History   Socioeconomic History   Marital status: Married    Spouse name: Not on file   Number of children: Not on file   Years of education: Not on file   Highest education level: Not on file  Occupational History   Not on file  Tobacco Use   Smoking status: Former    Types: Cigarettes    Quit date: 08/10/1960    Years since quitting: 61.7   Smokeless tobacco: Never  Vaping Use   Vaping Use: Never used  Substance and Sexual Activity   Alcohol use: No    Alcohol/week: 0.0 standard drinks of alcohol   Drug use: No   Sexual activity: Never  Other Topics Concern   Not on file  Social History  Narrative   Not on file   Social Determinants of Health   Financial Resource Strain: Not on file  Food Insecurity: Not on file  Transportation Needs: Not on file  Physical Activity: Not on file  Stress: Not on file  Social Connections: Not on file  Intimate Partner Violence: Not on file    FAMILY HISTORY: Family History  Problem Relation Age of Onset   Cancer Father        throat/ bone   Breast cancer Sister    Cancer Sister        MEDULAR CANCER   Cancer Brother        prostate   Cancer Other        MEDULAR CANCER   Breast cancer Maternal Aunt    Cancer Paternal Aunt        pancreatic    Cancer Cousin        ON MOTHER'S SIDE- LIVER CANCER    ALLERGIES:  is allergic to codeine.  MEDICATIONS:  Current Outpatient Medications  Medication Sig Dispense Refill   ALPRAZolam (XANAX) 1 MG tablet Take 0.5 tablets (0.5 mg total) by mouth 2 (two) times daily. 6 tablet 0   bacitracin ophthalmic ointment      cabozantinib s-malate (COMETRIQ) capsule (3 x 81m kit) Take 60 mg by mouth daily. 1 kit 1   Calcium Carb-Cholecalciferol 600-200 MG-UNIT  TABS 2 tablets     Calcium Carbonate-Vit D-Min (CALCIUM 600 + MINERALS PO) Take 1 tablet by mouth daily.     cetirizine (ZYRTEC) 10 MG tablet Take 10 mg by mouth daily.     Cholecalciferol (VITAMIN D) 1000 UNITS capsule Take 1,000 Units by mouth daily.     cholestyramine (QUESTRAN) 4 g packet DISSOLVE 1 PACKET IN LIQUID OF CHOICE AND DRINK 2 TIMES A DAY 60 packet 5   Cod Liver Oil 1000 MG CAPS Take 1,000 mg by mouth daily.     diphenhydramine-acetaminophen (TYLENOL PM EXTRA STRENGTH) 25-500 MG TABS tablet 1-2 tabs     Glucosamine-Chondroitin 250-200 MG CAPS Take 1 tablet by mouth daily. Name of table is SCHIFF     levothyroxine (SYNTHROID) 125 MCG tablet Take 1 tablet (125 mcg total) by mouth daily. 90 tablet 3   loperamide (IMODIUM A-D) 2 MG tablet Take 2 mg by mouth 3 (three) times daily.     Multiple Vitamin (MULTIVITAMIN) tablet Take 1 tablet by mouth daily.     ondansetron (ZOFRAN) 8 MG tablet Take 1 tablet by mouth every 8 hours as needed for nausea or vomiting. 30 tablet 0   pantoprazole (PROTONIX) 40 MG tablet TAKE 1 TABLET BY MOUTH TWICE DAILY BEFORE A MEAL 60 tablet 0   simethicone (MYLICON) 80 MG chewable tablet Chew 80 mg by mouth 3 (three) times daily.     sucralfate (CARAFATE) 1 g tablet TAKE 1 TABLET BY MOUTH 4 TIMES DAILY WITH MEALS AND AT BEDTIME 420 tablet 0   vitamin E 180 MG (400 UNITS) capsule 1 tablet     zoledronic acid (RECLAST) 5 MG/100ML SOLN injection Inject 5 mg into the vein once.     No current facility-administered medications for this visit.    REVIEW OF SYSTEMS:   10 Point review of Systems was done is negative except as noted above.  PHYSICAL EXAMINATION: Telemedicine appointment  LABORATORY DATA:  I have reviewed the data as listed     Latest Ref Rng & Units 03/02/2022   12:06 PM 11/03/2021    1:01  PM 10/12/2021    2:39 PM  CBC  WBC 4.0 - 10.5 K/uL 6.2  4.8  4.7   Hemoglobin 12.0 - 15.0 g/dL 13.3  12.9  11.8   Hematocrit 36.0 - 46.0 % 40.7  38.8   36.0   Platelets 150 - 400 K/uL 220  217  239    .    Latest Ref Rng & Units 03/02/2022   12:06 PM 11/03/2021    1:01 PM 10/12/2021    2:39 PM  CMP  Glucose 70 - 99 mg/dL 134  93  95   BUN 8 - 23 mg/dL 18  18  18    Creatinine 0.44 - 1.00 mg/dL 0.59  0.60  0.73   Sodium 135 - 145 mmol/L 138  139  142   Potassium 3.5 - 5.1 mmol/L 4.2  4.4  4.3   Chloride 98 - 111 mmol/L 101  101  103   CO2 22 - 32 mmol/L 27  33  28   Calcium 8.9 - 10.3 mg/dL 9.2  9.5  9.1   Total Protein 6.5 - 8.1 g/dL  7.1  6.7   Total Bilirubin 0.3 - 1.2 mg/dL  0.8  0.7   Alkaline Phos 38 - 126 U/L  82  86   AST 15 - 41 U/L  20  20   ALT 0 - 44 U/L  14  15         Component     Latest Ref Rng & Units 09/22/2021  T4,Free(Direct)     0.61 - 1.12 ng/dL 1.78 (H)  TSH     0.308 - 3.960 uIU/mL 2.572  Phosphorus     2.5 - 4.6 mg/dL 3.2  Magnesium     1.7 - 2.4 mg/dL 1.9   PATHOLOGY  L[facility] Biopsy at [facility] 09/23/14 DIAGNOSIS A. Pleural nodule, biopsy:  Calcified and hyalinized remote fat necrosis. No evidence of malignancy.   B. Level 9 lymph node, biopsy:  Metastatic medullary thyroid carcinoma. See comment.  Comment:: A note is made of the patient's history of medullary thyroid carcinoma with documented prior metastatic disease to cervical lymph nodes ([id number]; 04/05/2010).  The prior metastases to cervical lymph nodes are reviewed in conjunction to the current case and the tumors are morphologically similar.   Confirmatory immunohistochemical stains are performed. Tumor cells stain positively for calcitonin and for an anticytokeratin cocktail. The morphologic and immunophenotypic findings support metastatic medullary thyroid carcinoma.   C. Level 8 lymph node, biopsy:  Profiles of lymph nodes, negative for malignancy.   D. Pleural nodule #2, biopsy:  Fibrovascular tissue with extensive cauterization. No evidence of malignancy.        Surgical Pathology at [facility]  11/08/12 Immunohistochemical Findings An immunohistochemical stain for calcitonin obtained on paraffin block A1 is positive.    Diagnosis A. "RIGHT LEVEL 6 LYMPH NODES" (BIOPSY):      METASTATIC MEDULLARY THYROID CARCINOMA IN TWO LYMPH NODES (2/2).     SIZE OF LARGEST METASTASIS:  2 CM.     EXTRANODAL INVASION:  PRESENT. Comment:  I certify that I personally conducted the diagnostic evaluation of the above  specimen(s) and have rendered the above diagnosis(es).                             Rex C. Telford Nab, M.D. Telephone:[phone]  Electronically signed: 11/16/12   A. "Right level 6 lymph nodes", received fresh and placed in formalin on 1/9/ two fragments of tan-red soft tissue.  Fragment 1 is 1.3 x 1 x 0.6 cm and is bisected and submitted in block A1.  Fragment 2 is 2 x 1 x 0.4 cm and is submitted entirely in block A2.    RADIOGRAPHIC STUDIES: I have personally reviewed the radiological images as listed and agreed with the findings in the report. NM PET Image Restag (PS) Skull Base To Thigh  Result Date: 04/07/2022 CLINICAL DATA:  Subsequent treatment strategy for metastatic medullary thyroid cancer. EXAM: NUCLEAR MEDICINE PET SKULL BASE TO THIGH TECHNIQUE: 5.0 mCi F-18 FDG was injected intravenously. Full-ring PET imaging was performed from the skull base to thigh after the radiotracer. CT data was obtained and used for attenuation correction and anatomic localization. Fasting blood glucose: 110 mg/dl COMPARISON:  01/28/2021 FINDINGS: Mediastinal blood pool activity: SUV max 1.7 Liver activity: SUV max NA NECK: Right supraclavicular node measures 8 mm and a S.U.V. max of 4.3 versus 11 mm and a S.U.V. max of 5.9 on the prior exam. Muscular activity throughout the neck. Previous left TMJ activity is less impressive. Incidental CT findings: Carotid atherosclerosis. Thyroidectomy. Left mastoid effusion. CHEST: Right axillary nodes of up to 11 mm and a S.U.V. max of 3.9  on 52/4 versus 8 mm and a S.U.V. max of 3.8 on the prior exam (when remeasured). Calcified right paratracheal node measures 1.3 cm and a S.U.V. max of 4.4 versus 1.7 cm and a S.U.V. max of 4.0 on the prior exam (when remeasured). No pulmonary parenchymal hypermetabolism. Incidental CT findings: Centrilobular emphysema. Biapical pleuroparenchymal scarring. Calcifications in the left lung base likely relate to prior contrast aspiration. Dilated esophagus. Aortic and coronary artery calcification. Cardiomegaly. ABDOMEN/PELVIS: No abdominopelvic parenchymal or nodal hypermetabolism. Incidental CT findings: Normal adrenal glands. Left hepatic lobe 1.0 cm cyst. Gastrostomy. Paucity of abdominopelvic fat, limiting evaluation. Abdominal aortic atherosclerosis. SKELETON: Index sclerotic lesion within the T11 vertebral body measures a S.U.V. max of 4.0 today versus a S.U.V. max of 4.1 on the prior exam. L3 sclerotic lesion measures a S.U.V. max of 3.4 today versus a S.U.V. max of 2.7 on the prior. Index left acetabular lesion measures 1.6 cm on 166/4 versus 1.4 cm on the prior CT. Slightly more well-defined today. Incidental CT findings: Osteopenia.  Proximal left femur fixation. IMPRESSION: 1. Relatively similar volume nodal metastasis within the low neck and chest. 2. Hypermetabolic osseous metastasis, felt to be minimally progressive. 3. Incidental findings, including: Aortic atherosclerosis (ICD10-I70.0), coronary artery atherosclerosis and emphysema (ICD10-J43.9). Left mastoid effusion. Electronically Signed   By: Abigail Miyamoto M.D.   On: 04/07/2022 15:45      CT Chest w Contrast w 3D MIPS Protocol at [facility] 09/29/17 Impression: 1.  Mildly enlarged mediastinal lymph nodes are stable when compared to most recent CT. Although, several nodes are minimally increased from 03/08/2016.  2.  Refer to separately dictated abdominal CT report for findings below the diaphragm.  Bone Scan Whole Body at [facility]  09/27/17 Impression: 1. Stable foci of increased radiotracer activity in the right frontal calvarium, indeterminate. 2. No new scintigraphic evidence of osseous metastasis.  US Lymph Node Neck Mapping at [facility] 08/29/17 Impression: 1.  Newly measured right level 5 lymph node (lymph node #5) with abnormal rounded morphology, no echogenic hilum, and microcalcifications. Although biopsy of this nodule may be technically difficult due to its location deep to the carotid vasculature, ultrasound-guided  biopsy is recommended (if possible).  2.  Newly measured right level 2A lymph node without suspicious features.  3.  Additional right neck nodes are unchanged.   Bone Density Scan 02/27/17 @ [facility]  Lowest Site Measured: Total Left hip with T-score of -3.1 and BMD of 0.568   CT CAP W Contrast W MIPS at [facility] 02/13/17 Impression: 1. Please see CT neck completed same day for findings in the neck. 2. Stable nonenlarged mediastinal lymph nodes and soft tissue nodule in the right thyroidectomy bed. 3. Unchanged irregular opacities in the lung apices bilaterally, which may represent treated metastases or scarring. No new metastatic disease in the chest, abdomen, or pelvis.  CT Nect Soft Tissue W Contrast at [facility] 02/13/17 IMPRESSION: Numerous soft tissue enhancing lesions in the superior mediastinum. While these are stable when compared to most recent CT, several of these lesions demonstrate minimal growth from the more remote exam on 09/07/2015. No new enhancing lesions are present.  ASSESSMENT & PLAN:  Shannon [name] is a 81 y.o. caucasian female with   1. 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  She had a level 6 LN dissection on 11/08/12 with 2/2 LN positive, largest 2cm for MTC.  09/05/14 CAP CT with development of new  calcified nodules in the left lower lobe 09/23/14 Lung biopsy showed Level 9 lymph node, positive for malignancy and metastatic cancer which was resected. Treatment for symptomatic disease with vandetanib and cabozantibnib were previously discussed with her 18 Oncologist but the patient had decided to hold off on treatment due to concerns for toxicities and lack of overt cancer related progressive symptoms. Latest scans from 08/2017 are stable, no disease found below chest or in the bones. Her calcitonin levels have been increasing since 2015. Overall pt currently asymptomatic.   CT chest on 10/17/2018 which revealed "1. Unchanged mediastinal and right supraclavicular lymphadenopathy. Findings are nonspecific, however metastatic disease is not excluded. 2. Stable biapical scarring and right apical consolidative focus with associated calcification, likely postinfectious in nature."  10/11/2018 CT angiography chest w contrast revealed "1. No demonstrable pulmonary embolus. No thoracic aortic aneurysm or dissection. There are foci of aortic atherosclerosis as well as great vessel and coronary artery calcifications. 2. Fibrosis and cicatrization in the apices with apical pleural calcification, likely due to scarring. There is focal airspace opacity which is asymmetric in the apical segment of the right upper lobe. This finding may well be due to chronic scarring. A potential focus of pneumonia or possible neoplastic involvement in this area must be of concern. This finding may well warrant correlation with nuclear medicine PET study to assess for abnormal metabolic activity. At a minimum, a follow-up chest CT in 3 months to assess for stability of this area would be advised. No other airspace consolidation noted. Areas of patchy atelectasis noted. No well-defined nodular type lesions noted in the lung parenchyma. 3. Probable sclerotic bony metastases at C3 and L1. Sclerotic focus at L1 is incompletely  visualized. Medullary thyroid carcinoma as well as breast carcinoma potentially may present with sclerotic as opposed to lytic metastases. 4.  No demonstrable thoracic adenopathy. 5. Extensive postoperative change in the thyroid region with evidence of total thyroidectomy. 6. Mild reflux into the inferior vena cava and hepatic veins suggests increase in right heart pressure. Aortic Atherosclerosis (ICD10-I70.0)."  12/06/2019 CT C/A/P ([id number]) ([id number]) revealed "1. Stable  exam. No new or progressive findings to suggest progressive metastatic disease. 2. Scattered sclerotic bone lesions, similar to prior. 3. Stable mild intrahepatic and extrahepatic biliary duct dilatation. 4. Trace free fluid in the cul-de-sac."  12/06/2019 CT Soft Tissue Neck ([id number]) revealed "1. Thyroidectomy with nodal metastases at the thoracic inlet/anterior compartment and right supraclavicular fossa. There are no prior neck CT comparison is available for review. 2. 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 Borderline enlarged supraclavicular and mediastinal lymph nodes, stable. 2. Osseous metastatic disease, stable. 3. Left lower lobe aspiration and trace left pleural fluid. 4. Hepatomegaly. 5. Aortic atherosclerosis."  04/06/2022 PET CT ([id number]) revealed "1. Relatively similar volume nodal metastasis within the low neck and chest. 2. Hypermetabolic osseous metastasis, felt to be minimally progressive. 3. Incidental findings, including: Aortic atherosclerosis (ICD10-I70.0), coronary artery atherosclerosis and emphysema (ICD10-J43.9). Left mastoid effusion."  2. 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 -Secondary to hypercalcitoninemia  -Currently on imodium daily, cholestyramine twice daily  -was previously on monthly Sandostatin injections in 10/20/17- but patient decided to hold  citing no real benefit from adding this -Her last colonoscopy was in 2010  3.  Grade 3 fatigue related to the Cometriq at 60 mg p.o. daily-has been off Cometriq since 09/22/2021   4. Osteoporosis - Last DEXA 02/27/17 with Lowest Site Measured: Total Left hip with T-score of -3.1 and BMD of 0.568 Closed left hip fracture from fall -s/p surgery on 10/23/17 with rod placement - healing well.  Continue osteoporosis management with Zometa  5. Chronic oropharyngeal Dysphagia ? Related to previous neck surgery. Dry mouth etc. No acute changes  -Continue tube feeding  PLAN:  -Recent labs were reviewed with her in detail.  Evidence of disease progression based of increasing CEA and calcitonin levels. CBC and CMP done 03/02/2022 was unremarkable. TSH done 04/01/2022 WNL at 1.10. Glucose done 04/06/2022 unremarkable. -PET CT scan done 04/06/2022 revealed no significant signs of new or progressive disease at this time. -Diarrhea is well controlled with as needed Imodium. -She continues with sucralfate and PPI twice daily to try to control her acid reflux.. -She notes her tube feeding tube is noted to be problematic and is going to be replaced by GI. Continue to follow-up with endocrinology for continued management and replacement of levothyroxine. -Her weight is stable and well maintained with tube feeding at this time.  FOLLOW UP: RTC with Dr Irene Limbo with labs in 6 months  The total time spent in the appointment was 20 minutes*.  All of the patient's questions were answered with apparent satisfaction. The patient knows to call the clinic with any problems, questions or concerns.   Sullivan Lone MD MS AAHIVMS [facility] Hematology/Oncology Physician [facility]  .*Total Encounter Time as defined by the Centers for Medicare and Medicaid Services includes, in addition to the face-to-face time of a patient visit (documented in the note above) non-face-to-face time: obtaining and reviewing  outside history, ordering and reviewing medications, tests or procedures, care coordination (communications with other health care professionals or caregivers) and documentation in the medical record.  I, Melene Muller, am acting as scribe for Dr. Sullivan Lone, MD. .I have reviewed the above documentation for accuracy and completeness, and I agree with the above. Brunetta Genera MD

## 2022-04-13 ENCOUNTER — Encounter: Payer: Self-pay | Admitting: Hematology

## 2022-04-14 ENCOUNTER — Encounter: Payer: Self-pay | Admitting: Hematology

## 2022-04-14 NOTE — Progress Notes (Signed)
This encounter was created in error - please disregard.

## 2022-05-16 ENCOUNTER — Other Ambulatory Visit (HOSPITAL_COMMUNITY): Payer: Self-pay | Admitting: Gastroenterology

## 2022-05-16 DIAGNOSIS — R627 Adult failure to thrive: Secondary | ICD-10-CM

## 2022-05-17 ENCOUNTER — Ambulatory Visit (HOSPITAL_COMMUNITY)
Admission: RE | Admit: 2022-05-17 | Discharge: 2022-05-17 | Disposition: A | Discharge status: Home / Self Care | Payer: Medicare Other | Source: Ambulatory Visit | Attending: Gastroenterology | Admitting: Gastroenterology

## 2022-05-17 DIAGNOSIS — R627 Adult failure to thrive: Secondary | ICD-10-CM

## 2022-05-17 DIAGNOSIS — K9423 Gastrostomy malfunction: Secondary | ICD-10-CM | POA: Insufficient documentation

## 2022-05-17 HISTORY — PX: IR REPLC GASTRO/COLONIC TUBE PERCUT W/FLUORO: IMG2333

## 2022-05-17 MED ORDER — LIDOCAINE VISCOUS HCL 2 % MT SOLN
OROMUCOSAL | Status: AC
  Filled 2022-05-17: qty 15

## 2022-05-17 MED ORDER — IOHEXOL 300 MG/ML  SOLN
50.0000 mL | Freq: Once | INTRAMUSCULAR | Status: AC | PRN
  Administered 2022-05-17: 10 mL

## 2022-05-17 NOTE — Procedures (Signed)
Interventional Radiology Procedure:   Indications: Dislodged gastrostomy  Procedure: Gastrostomy tube replacement  Findings: New 18 Fr balloon retention gastrostomy tube in stomach  Complications: No immediate complications noted.     EBL: Minimal  Plan: Gastrostomy tube is ready to use.     Jonesha Tsuchiya R. Anselm Pancoast, MD  Pager: [phone]

## 2022-05-25 ENCOUNTER — Telehealth: Payer: Self-pay | Admitting: Hematology

## 2022-05-25 NOTE — Telephone Encounter (Signed)
.  Called patient to schedule appointment per May inbasket, patient is aware of date and time.

## 2022-06-17 ENCOUNTER — Other Ambulatory Visit: Payer: Self-pay | Admitting: Family Medicine

## 2022-06-17 ENCOUNTER — Ambulatory Visit
Admission: RE | Admit: 2022-06-17 | Discharge: 2022-06-17 | Disposition: A | Discharge status: Home / Self Care | Payer: Medicare Other | Source: Ambulatory Visit | Attending: Family Medicine | Admitting: Family Medicine

## 2022-06-17 DIAGNOSIS — R0609 Other forms of dyspnea: Secondary | ICD-10-CM

## 2022-06-17 DIAGNOSIS — R0602 Shortness of breath: Secondary | ICD-10-CM

## 2022-06-17 DIAGNOSIS — R5383 Other fatigue: Secondary | ICD-10-CM

## 2022-06-17 DIAGNOSIS — E3122 Multiple endocrine neoplasia [MEN] type IIA: Secondary | ICD-10-CM

## 2022-08-31 ENCOUNTER — Encounter: Payer: Self-pay | Admitting: Hematology

## 2022-09-15 ENCOUNTER — Other Ambulatory Visit: Payer: Self-pay

## 2022-10-10 ENCOUNTER — Telehealth: Payer: Self-pay | Admitting: Hematology

## 2022-10-10 NOTE — Telephone Encounter (Signed)
Per 12/11 IB, pt has been called and confirmed

## 2022-10-12 ENCOUNTER — Ambulatory Visit: Payer: Medicare Other | Admitting: Hematology

## 2022-10-12 ENCOUNTER — Other Ambulatory Visit: Payer: Medicare Other

## 2022-10-17 ENCOUNTER — Ambulatory Visit: Payer: Medicare Other

## 2022-10-17 NOTE — Progress Notes (Signed)
Nutrition Follow-up:  Notified by Dr Therisa Doyne, GI of recent weight loss to 86 lb last week at office visit.  Last seen by RD at [facility] on 09/2021  81 year old female followed by Dr Irene Limbo for metastatic medullary thyroid cancer (lungs).  Noted PET from 03/2022 per MD note no progressive disease.  Patient currently not on treatment, seen q 6 months by Dr Irene Limbo.  Spoke with patient via phone. Patient reports that she has been taking 4 cartons of osmolite 1.5 for "awhile" at least several months but can't remember.  She does not feel like she has missed any cartons of tube feeding and has consistently been giving 4 cartons each day. Previously was on nutren 1.5 taking 3 cartons.  Patient has increased to 5 cartons per day after seeing Dr Therisa Doyne last week.  Wants to work up to 6-8 cartons per day.  Flushing with 52ml of water before and after each feeding.  Giving some medications through tube if unable to swallow.   Reports that she is taking very little by mouth, bites of mashed potatoes, yogurt.  Taking sips of water, mayb 1/2 cup-3/4 cup per day due to swallowing, reflux and mucous.  Says that she takes imodium 4 times a day and this has not changed.  Has a bowel movement daily or every other day. Sometimes it is diarrhea other times more formed stool.  Diarrhea has not worsened with increase in tube feeding.      Medications: reviewed  Labs: no recent  Anthropometrics:   Height: 63 inches Weight: 86 lb last week at GI office UBW: 90s 93 lb on 03/02/2022 BMI: 16  7% weight loss in the last 6 months   Estimated Energy Needs  Kcals: 1300-1500 (Using 93 lb, 30-35 kcals/kg) Protein: 65-75 g Fluid: 1300-1500 ml  NUTRITION DIAGNOSIS: Unintentional weight loss related to ?? As evidenced by 7% weight loss in the last 6 months.     INTERVENTION:  Recommend osmolite 1.5, 5 cartons per day (1 carton at 9,12,3pm, 6pm and 9pm).  Flush with 40ml of water before and after.   Provides 1775 calories, 74.5  g protein and 1531ml free water (includes free water from formula)  Patient giving additional water with medications and sipping orally Patient planning on reaching out to Dr Encarnacion Slates office to see if they sent in new prescription for increase in tube feeding.   Patient receives tube feeding supplies and formula from [facility]. Oral intake as able Provided patient with RD's contact information as well as Pamala Hurry Neff's contact information    MONITORING, EVALUATION, GOAL: weight trends, tube feeding   NEXT VISIT: Monday, Jan 15th in person  Lizandra Zakrzewski B. Zenia Resides, [place], [place] Registered Dietitian [phone]

## 2022-10-30 ENCOUNTER — Other Ambulatory Visit: Payer: Self-pay

## 2022-10-30 ENCOUNTER — Emergency Department (HOSPITAL_BASED_OUTPATIENT_CLINIC_OR_DEPARTMENT_OTHER)
Admission: EM | Admit: 2022-10-30 | Discharge: 2022-10-30 | Disposition: A | Discharge status: Home / Self Care | Payer: Medicare Other | Attending: Emergency Medicine | Admitting: Emergency Medicine

## 2022-10-30 ENCOUNTER — Encounter (HOSPITAL_BASED_OUTPATIENT_CLINIC_OR_DEPARTMENT_OTHER): Payer: Self-pay

## 2022-10-30 ENCOUNTER — Emergency Department (HOSPITAL_BASED_OUTPATIENT_CLINIC_OR_DEPARTMENT_OTHER): Payer: Medicare Other

## 2022-10-30 DIAGNOSIS — Y732 Prosthetic and other implants, materials and accessory gastroenterology and urology devices associated with adverse incidents: Secondary | ICD-10-CM | POA: Insufficient documentation

## 2022-10-30 DIAGNOSIS — T85528A Displacement of other gastrointestinal prosthetic devices, implants and grafts, initial encounter: Secondary | ICD-10-CM | POA: Diagnosis present

## 2022-10-30 MED ORDER — IOHEXOL 300 MG/ML  SOLN
25.0000 mL | Freq: Once | INTRAMUSCULAR | Status: AC | PRN
  Administered 2022-10-30: 25 mL

## 2022-10-30 NOTE — ED Triage Notes (Signed)
Patient here POV from Home.  Endorses performing feeding through G-Tube a few hours ago when she looked down and noted her G-Tube had become dislodged.   No Pain.   NAD Noted during Triage. A&Ox4. GCS 15. Ambulatory.

## 2022-10-30 NOTE — ED Provider Notes (Signed)
[facility] EMERGENCY DEPT Provider Note   CSN: [id number] Arrival date & time: 10/30/22  0488     History  Chief Complaint  Patient presents with   G-Tube Displacement    Shannon [name] is a 81 y.o. female.  HPI Patient has a feeding tube that came out about 3 hours prior to arrival.  She reports that it fell out.  No pain no trauma.  Dr. Paulita Fujita called to request replacement with a Foley catheter.  Patient's G-tube size is 46 [place].    Home Medications Prior to Admission medications   Medication Sig Start Date End Date Taking? Authorizing Provider  ALPRAZolam Duanne Moron) 1 MG tablet Take 0.5 tablets (0.5 mg total) by mouth 2 (two) times daily. 10/27/17   Nita Sells, MD  bacitracin ophthalmic ointment  08/15/20   [provider]  cabozantinib s-malate (COMETRIQ) capsule (3 x 76m kit) Take 60 mg by mouth daily. 09/01/21   KBrunetta Genera MD  Calcium Carb-Cholecalciferol 600-200 MG-UNIT TABS 2 tablets    [provider]  Calcium Carbonate-Vit D-Min (CALCIUM 600 + MINERALS PO) Take 1 tablet by mouth daily.    [provider]  cetirizine (ZYRTEC) 10 MG tablet Take 10 mg by mouth daily.    [provider]  Cholecalciferol (VITAMIN D) 1000 UNITS capsule Take 1,000 Units by mouth daily.    [provider]  cholestyramine (QUESTRAN) 4 g packet DISSOLVE 1 PACKET IN LIQUID OF CHOICE AND DRINK 2 TIMES A DAY 04/09/21   Shamleffer, IMelanie Crazier MD  Cod Liver Oil 1000 MG CAPS Take 1,000 mg by mouth daily.    [provider]  diphenhydramine-acetaminophen (TYLENOL PM EXTRA STRENGTH) 25-500 MG TABS tablet 1-2 tabs    [provider]  Glucosamine-Chondroitin 250-200 MG CAPS Take 1 tablet by mouth daily. Name of table is SCHIFF 12/23/10   [provider]  levothyroxine (SYNTHROID) 125 MCG tablet Take 1 tablet (125 mcg total) by mouth daily. 01/18/22   Shamleffer, IMelanie Crazier MD  loperamide  (IMODIUM A-D) 2 MG tablet Take 2 mg by mouth 3 (three) times daily.    [provider]  Multiple Vitamin (MULTIVITAMIN) tablet Take 1 tablet by mouth daily.    [provider]  ondansetron (ZOFRAN) 8 MG tablet Take 1 tablet by mouth every 8 hours as needed for nausea or vomiting. 08/09/21   KBrunetta Genera MD  pantoprazole (PROTONIX) 40 MG tablet TAKE 1 TABLET BY MOUTH TWICE DAILY BEFORE A MEAL 03/31/22   KBrunetta Genera MD  simethicone ([facility] 80 MG chewable tablet Chew 80 mg by mouth 3 (three) times daily.    [provider]  sucralfate (CARAFATE) 1 g tablet TAKE 1 TABLET BY MOUTH 4 TIMES DAILY WITH MEALS AND AT BEDTIME 11/30/21   KBrunetta Genera MD  vitamin E 180 MG (400 UNITS) capsule 1 tablet    [provider]  zoledronic acid (RECLAST) 5 MG/100ML SOLN injection Inject 5 mg into the vein once.    [provider]      Allergies    Codeine    Review of Systems   Review of Systems  Physical Exam Updated Vital Signs BP (!) 150/82 (BP Location: Right Arm)   Pulse (!) 115   Temp 98.6 F (37 C) (Temporal)   Resp 18   Ht _0  (1.6 m)   Wt 42.3 kg   SpO2 98%   BMI 16.52 kg/m  Physical Exam Constitutional:  Comments: Alert nontoxic.  No distress.  Sitting up in bed.  Very thin but well in appearance.  HENT:     Mouth/Throat:     Pharynx: Oropharynx is clear.  Cardiovascular:     Rate and Rhythm: Normal rate and regular rhythm.  Pulmonary:     Effort: Pulmonary effort is normal.     Breath sounds: Normal breath sounds.  Abdominal:     General: There is no distension.     Palpations: Abdomen is soft.     Tenderness: There is no abdominal tenderness. There is no guarding.     Comments: Ostomy site clean and well-established.  Musculoskeletal:        General: Normal range of motion.  Skin:    General: Skin is warm and dry.  Neurological:     General: No focal deficit present.     Mental Status: She is  oriented to person, place, and time.     Coordination: Coordination normal.  Psychiatric:        Mood and Affect: Mood normal.     ED Results / Procedures / Treatments   Labs (all labs ordered are listed, but only abnormal results are displayed) Labs Reviewed - No data to display  EKG None  Radiology DG Abdomen 1 View  Result Date: 10/30/2022 CLINICAL DATA:  Gastrografin irrigation for G-tube placement. EXAM: ABDOMEN - 1 VIEW COMPARISON:  X-ray abdomen 03/08/2022 FINDINGS: Gastrostomy tube overlying the left mid abdomen with PO contrast administered via the tube partially opacifying the stomach. The bowel gas pattern is normal. No radio-opaque calculi or other significant radiographic abnormality are seen. IMPRESSION: Gastrostomy tube in good position. Electronically Signed   By: Iven Finn M.D.   On: 10/30/2022 20:52    Procedures Gastrostomy tube replacement  Date/Time: 10/30/2022 8:12 PM  Performed by: Charlesetta Shanks, MD Authorized by: Charlesetta Shanks, MD  Consent: Verbal consent obtained. Local anesthesia used: no  Anesthesia: Local anesthesia used: no  Sedation: Patient sedated: no  Patient tolerance: patient tolerated the procedure well with no immediate complications Comments: 3 French Foley catheter inserted without difficulty.       Medications Ordered in ED Medications  iohexol (OMNIPAQUE) 300 MG/ML solution 25 mL (25 mLs Per Tube Contrast Given 10/30/22 2048)    ED Course/ Medical Decision Making/ A&P                           Medical Decision Making Amount and/or Complexity of Data Reviewed Radiology: ordered.  Risk Prescription drug management.   64 French gastrostomy tube displaced at home.  Patient presents for replacement.  No other complaints.  2 French Foley catheter inserted without difficulty.  I have personally reviewed the postplacement abdominal film with good placement.  Confirmed by radiology.  Patient discharged in  good condition.  Dr. Paulita Fujita called ahead and advised that he had made arrangements with IR for replacing the G-tube on Tuesday.  Patient stable for discharge with follow-up plan in place.        Final Clinical Impression(s) / ED Diagnoses Final diagnoses:  Dislodged gastrostomy tube    Rx / DC Orders ED Discharge Orders     None         Charlesetta Shanks, MD 10/30/22 2117

## 2022-10-30 NOTE — Discharge Instructions (Addendum)
1.  Follow-up with interventional radiology as instructed by Dr. Paulita Fujita. 2.  Return if you have any problems with the catheter holding your gastrostomy open.  3.  You may give liquid feeds through the Foley catheter.  Flush catheter and cath it after using.

## 2022-11-01 ENCOUNTER — Ambulatory Visit (HOSPITAL_COMMUNITY)
Admission: RE | Admit: 2022-11-01 | Discharge: 2022-11-01 | Disposition: A | Discharge status: Home / Self Care | Payer: Medicare Other | Source: Ambulatory Visit | Attending: Interventional Radiology | Admitting: Interventional Radiology

## 2022-11-01 ENCOUNTER — Ambulatory Visit (HOSPITAL_COMMUNITY)
Admission: RE | Admit: 2022-11-01 | Discharge: 2022-11-01 | Disposition: A | Discharge status: Home / Self Care | Payer: Medicare Other | Source: Ambulatory Visit | Attending: Student | Admitting: Student

## 2022-11-01 ENCOUNTER — Other Ambulatory Visit (HOSPITAL_COMMUNITY): Payer: Self-pay | Admitting: Interventional Radiology

## 2022-11-01 DIAGNOSIS — R633 Feeding difficulties, unspecified: Secondary | ICD-10-CM | POA: Insufficient documentation

## 2022-11-01 DIAGNOSIS — Z431 Encounter for attention to gastrostomy: Secondary | ICD-10-CM | POA: Diagnosis present

## 2022-11-01 HISTORY — PX: IR REPLACE G-TUBE SIMPLE WO FLUORO: IMG2323

## 2022-11-01 MED ORDER — IOHEXOL 300 MG/ML  SOLN
50.0000 mL | Freq: Once | INTRAMUSCULAR | Status: AC | PRN
  Administered 2022-11-01: 25 mL

## 2022-11-01 MED ORDER — LIDOCAINE VISCOUS HCL 2 % MT SOLN
OROMUCOSAL | Status: AC
  Filled 2022-11-01: qty 15

## 2022-11-01 NOTE — Procedures (Signed)
PROCEDURE SUMMARY:  Successful exchange of gastrostomy tube. No complications.   EBL = none   G tube location confirmation KUB ordered.   Please see full dictation in imaging section of Epic for procedure details.   Armando Gang Cloy Cozzens PA-C 11/01/2022 12:05 PM

## 2022-11-11 ENCOUNTER — Other Ambulatory Visit: Payer: Self-pay

## 2022-11-11 DIAGNOSIS — C73 Malignant neoplasm of thyroid gland: Secondary | ICD-10-CM

## 2022-11-14 ENCOUNTER — Other Ambulatory Visit: Payer: Self-pay

## 2022-11-14 ENCOUNTER — Inpatient Hospital Stay: Payer: Medicare Other | Admitting: Nutrition

## 2022-11-14 ENCOUNTER — Inpatient Hospital Stay: Payer: Medicare Other | Attending: Hematology

## 2022-11-14 ENCOUNTER — Inpatient Hospital Stay (HOSPITAL_BASED_OUTPATIENT_CLINIC_OR_DEPARTMENT_OTHER): Payer: Medicare Other | Admitting: Hematology

## 2022-11-14 VITALS — BP 124/58 | HR 110 | Temp 97.8°F | Resp 15 | Ht 63.0 in | Wt 90.4 lb

## 2022-11-14 DIAGNOSIS — C7951 Secondary malignant neoplasm of bone: Secondary | ICD-10-CM | POA: Insufficient documentation

## 2022-11-14 DIAGNOSIS — C7800 Secondary malignant neoplasm of unspecified lung: Secondary | ICD-10-CM | POA: Insufficient documentation

## 2022-11-14 DIAGNOSIS — K529 Noninfective gastroenteritis and colitis, unspecified: Secondary | ICD-10-CM | POA: Insufficient documentation

## 2022-11-14 DIAGNOSIS — C73 Malignant neoplasm of thyroid gland: Secondary | ICD-10-CM

## 2022-11-14 DIAGNOSIS — M810 Age-related osteoporosis without current pathological fracture: Secondary | ICD-10-CM | POA: Diagnosis not present

## 2022-11-14 LAB — CMP (CANCER CENTER ONLY)
ALT: 23 U/L (ref 0–44)
AST: 22 U/L (ref 15–41)
Albumin: 3.9 g/dL (ref 3.5–5.0)
Alkaline Phosphatase: 91 U/L (ref 38–126)
Anion gap: 7 (ref 5–15)
BUN: 31 mg/dL — ABNORMAL HIGH (ref 8–23)
CO2: 31 mmol/L (ref 22–32)
Calcium: 9.5 mg/dL (ref 8.9–10.3)
Chloride: 104 mmol/L (ref 98–111)
Creatinine: 0.51 mg/dL (ref 0.44–1.00)
GFR, Estimated: >60 mL/min (ref >60)
Glucose, Bld: 79 mg/dL (ref 70–99)
Potassium: 4.1 mmol/L (ref 3.5–5.1)
Sodium: 142 mmol/L (ref 135–145)
Total Bilirubin: 0.6 mg/dL (ref 0.3–1.2)
Total Protein: 7 g/dL (ref 6.5–8.1)

## 2022-11-14 LAB — CBC WITH DIFFERENTIAL (CANCER CENTER ONLY)
Abs Immature Granulocytes: 0.02 10*3/uL (ref 0.00–0.07)
Basophils Absolute: 0.1 10*3/uL (ref 0.0–0.1)
Basophils Relative: 1 %
Eosinophils Absolute: 0.1 10*3/uL (ref 0.0–0.5)
Eosinophils Relative: 1 %
HCT: 40.3 % (ref 36.0–46.0)
Hemoglobin: 13.4 g/dL (ref 12.0–15.0)
Immature Granulocytes: 0 %
Lymphocytes Relative: 4 %
Lymphs Abs: 0.3 10*3/uL — ABNORMAL LOW (ref 0.7–4.0)
MCH: 28.8 pg (ref 26.0–34.0)
MCHC: 33.3 g/dL (ref 30.0–36.0)
MCV: 86.5 fL (ref 80.0–100.0)
Monocytes Absolute: 0.8 10*3/uL (ref 0.1–1.0)
Monocytes Relative: 10 %
Neutro Abs: 6.5 10*3/uL (ref 1.7–7.7)
Neutrophils Relative %: 84 %
Platelet Count: 239 10*3/uL (ref 150–400)
RBC: 4.66 MIL/uL (ref 3.87–5.11)
RDW: 14.2 % (ref 11.5–15.5)
WBC Count: 7.7 10*3/uL (ref 4.0–10.5)
nRBC: 0 % (ref 0.0–0.2)

## 2022-11-14 MED ORDER — SUCRALFATE 1 G PO TABS: ORAL_TABLET | ORAL | 3 refills | Status: DC

## 2022-11-14 NOTE — Progress Notes (Signed)
Nutrition follow-up completed with patient who is followed by Dr. Candise Che for metastatic medullary thyroid cancer.  Patient is currently not on treatment but she is seen every 6 months by Dr. Candise Che.  Patient reports she had a G-tube exchange on November 01, 2022.  Patient weighed 90 pounds 6.4 ounces today.  This is decreased by 3 pounds from last 3 documented weights.  Labs noted: BUN 31.  Revised estimated nutrition needs:  1500-1700 cal, 65-75 g protein, greater than 1.5 L fluid.(40 cal/kg)  Patient reports she has increased tube feedings to 5 cartons of Osmolite 1.5+2 cartons of equate plus daily via feeding tube split into 5 feedings.  Reports home health company ran out of Osmolite 1.5 so she purchased equate plus to use.  Reports she will have used the last of her Osmolite 1.5 today.  Osmolite 1.5 provides 355 cal and 14.9 g protein per carton.  Equate plus is providing 350 cal and 13 g protein per carton.  Patient uses 120 mL of free water after bolus feedings 5 times daily.  Patient denies nausea, vomiting, constipation, and diarrhea.  She still tries to take bites of yogurt, applesauce, and mashed potatoes.  Reports she is not drinking more than 4 ounces of water daily.  Reports MD is checking TSH.  Nutrition diagnosis: Unintentional weight loss continuing.  Intervention: Continue 5 cartons of Osmolite 1.5+2 cartons of equate plus via PEG with 60 mL free water before and after bolus feedings.  Once patient uses up equate plus, give 7 cartons of Osmolite 1.5/day split into 5 feedings at 9 AM, 12 noon, 3 PM, 6 PM, and 9 PM.  Give 120 mL free water flush after each bolus feeding. Add 180 mL of free water 3 times daily between feedings.   7 cartons of Osmolite 1.5+ free water flushes will provide 2485 cal, 104.3 g protein, 2407 mL free water. Tube feeding regimen providing far greater than 100% estimated nutrition needs. Provided 2 complementary cases of Osmolite 1.5. Will message Dr. Marca Ancona to  send new orders for tube feeding to home health company. ([facility])  Monitoring, evaluation, goals: Patient will tolerate adequate calories and protein to minimize weight loss.  Next visit: Monday, February 12 for weight check and tube feeding tolerance.  **Disclaimer: This note was dictated with voice recognition software. Similar sounding words can inadvertently be transcribed and this note may contain transcription errors which may not have been corrected upon publication of note.**   **Disclaimer: This note was dictated with voice recognition software. Similar sounding words can inadvertently be transcribed and this note may contain transcription errors which may not have been corrected upon publication of note.**

## 2022-11-14 NOTE — Progress Notes (Signed)
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 total thyroidectomy in January of 2011 and bilateral neck dissection in June of 2011.  B. Persistent hypercalcitoninemia postoperatively. Calcitonin 499 in 07/2011  C. December 2012: CT of neck/chest was negative for metastases D. She had a normal neck ultrasound in 01/2012 showing her cystic LN was smaller (the one biopsied and negative).  E. FDG PET 08/2012 due to her last calcitonin raising substantially to 739 from the 500s: concerning right cervical lymph node F. 11/08/2012: Level 6 LN dissection. She had 2/2 LN positive, largest 2cm for MTC. Calcitonin one week later from her surgery was down to 515.  G. Calcitonin 592 on 05/24/2013. Repeat ultrasound on that same day which showed 2 at least partially cystic lymph nodes at level 4 which had been previously biopsied and negative; decreased in size since 2012. It also showed a subcentimeter right level VB LN of low suspicion. H. Her metanephrines (plasma) and PTH were normal in 12/2013 Urine studies for pheo were negative in 12/2013. I. 08/04/2014: CEA 9.0; calcitonin 1019 J. 09/05/2014:  - C/A/P CT scan: Interval development of a new calcified nodule in the left lower lobe. - Bone scan: Slight asymmetric increased radiotracer uptake in the right lateral calvarium on the frontal radiograph - Neck U/S: Stable right lateral compartment level 4 and 5B lymph nodes. The cystic right level 4 lymph node, previously biopsied on 01/14/2011 with negative pathology results.  K. 09/23/2014: Thoracoscopy: level 9 LN: Metastatic medullary thyroid carcinoma.  L.  03/11/2015: CEA 15.9; calcitonin 1149 M 04-13-2015:  - C/A/P CT scan: Previously reported left lower lobe pulmonary nodule is no longer identified. No new or enlarging pulmonary nodules. No evidence of metastatic disease in the abdomen or pelvis. - CT brain:No acute intracranial abnormalities.   New small CSF density subdural fluid collection overlying the right cerebral hemisphere, likely a small hygroma of uncertain etiology. There is no associated bony or parenchymal lesion. 04-13-2015: Bone scan: A non-specific, very small focus of increased tracer activity in the right frontal calvarium is not significantly changed from the prior study. - Neck U/S: There is hypoechoic lesion with questionable microcalcification located in right level IV, it is 0.4 x 0.6 x 0.9 cm. No other pathologic cervical lymphadenopathy seen.  N. 04/13/2015: CEA 14.6; calcitonin 1324 O. 07/16/2015 ([facility]): CEA 19.4 (outsode of [facility]); CAlcitonin 1860. P. 09-07-2015: Neck, Chest , Abd, Perlvic CT: Stable post-thyroidectomy changes without evidence of recurrent or metastatic disease. Stable pulmonary nodules. Hepatic cysts. No bone mets. CEA: 16; Calcitonin 1533 Q. 03-08-2016: C/A/P CT scan: No definite evidence of metastatic disease in the chest, abdomen or pelvis. Compared to 09/07/2015, there is a new linear 3 mm right lower lobe pulmonary nodule which is indeterminate and may represent focal atelectasis CEA 16.5; CAlcitonin 1839  R. 08-22-2016: Calcitonin: 4596; CEA 27.1  S. 09-06-2016:  - Bone scan: Redemonstrated likely two foci of increased radiotracer uptake within the right calvarium, similar to prior study. - Neck CT: No evidence of recurrent or metastatic disease in the neck. - C/A/P CT: No definite evidence of metastatic disease in the chest, abdomen, or pelvis. - Neck CT: Status post thyroidectomy and  lymph node dissection without evidence of recurrent or metastatic disease.  T. Bone scan: A focus of increased tracer  activity projecting over the posterior right approximate 8th rib is non-specific; this finding may relate to summation of tracer activity in the rib with the inferior tip of the overlying right scapula, post-traumatic change or skeletal metastasis; recommend attention on follow-up. Re-demonstration of subtly increased tracer activity in the right frontal calvarium, stable since 09/05/2014. U. 02-13-2017:  - C/A/P CT scan: nonenlarged mediastinal lymph nodes and soft tissue nodule in the right thyroidectomy bed. Unchanged irregular opacities in the lung apices bilaterally, which may represent treated metastases or scarring.  - Neck CT: Numerous soft tissue enhancing lesions in the superior mediastinum. While these are stable when compared to most recent CT, several of these lesions demonstrate minimal growth from the more remote exam on 09/07/2015. No new enhancing lesions are present. - CEA 25.1, Calcitonin: 5498 V. 08-28-2017: fractionate plasma free metanephrines: Normetanephrine 178 (24hr urine pending as this was thought to be non-specific), Metanephrine: 44 (normal). CEA 46; Calcitonin 9763 W. 09-27-2017: Bone scan: Stable increased radiotracer activity in the right frontal calvarium, indeterminate. No other evidence of osseous metastasis Chest CT/Abd: Mildly enlarged mediastinal lymph nodes are stable. No evidence of metastatic disease in the abdomen   HISTORY OF PRESENTING ILLNESS:  Shannon [name] is a wonderful 82 y.o. female who has been referred to [place] by [facility] oncologist Dr. Leslie Andrea for management of Metastatic Medullary Thyroid Cancer. She is looking for a Financial controller in [place].  Today she is accompanied by her sister. Patient notes this started with a nodule in her throat and her endocrinologist started further work up. She has been seeing Med onc at [facility] since her first surgery every 3-6 months.   She has had thyroid surgery and Ldissection but no systemic  therapy as detailed above in the oncologic history.  She notes her family had testing for MEN2A mutation. Her sister, her nephew carry the gene. She notes her maternal aunt had goiters but not sure of the cause.   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 She has had diarrhea since 2010. Her last colonoscopy was in 2010. She has tried multiple treatment options but this continues to worsen over time. She currently takes imodium 4-6 X daily, cholestyramine twice daily. She started monthly Sandostatin on 10/20/17. She fell and broke her hip on 10/23/17 and had surgery same day. She was given hydrocodone which helped her diarrhea and is not sure if the Sandostatin is working for her. She has been on oral iron and she is on aspirin 325mg  since her hip surgery. She does PT exercises and has been healing well.    She recently stopped caffeine tablets that she was taking for energy. She stopped 3 months ago. She is on Reclast for her osteoporosis, which is managed by her PCP, Dr. Deatra Ina. Pt notes she stopped smoking about 40 years ago and she rarely drinks alcohol.   On review of symptoms, pt notes continued diarrhea, stable energy, weight loss of 6-8 pounds in the last 6 months. Her appetite is adequate. Her diarrhea occurs currently twice a day with "pudding" form to liquid form stool. She can go up to 5-7 times a day at times. She denies cramping with diarrhea. She denies blood, and mucus in stool. Current use of oral iron causes some black stool, which was not present before use.   INTERVAL HISTORY:  Mrs .LESLEA VOWLES is a 81 y.o. female who is here for continued evaluation and management metastatic medullary thyroid cancer.   I had a phone visit with the patient on 04/11/2022 and she was doing well overall. During the phone visit, we discussed her PET CT scan results from 04/06/2022 which did not show any significant signs of new or progressive disease.   Patient  is accompanied with a family member during this visit and notes she has been doing fairly well since our last visit. She still complains of occasional fatigue since our last visit.   Patient denies fever, chills, night sweats, new infection issue, unexpected weight loss, abdominal pain, swallowing problems, or leg swelling.   She complains of increased shortness of breath since our last visit, which she contributes it to stress. Her stress is due to taking care of her 67 year old husband with health problems.    She also complains of diarrhea and constipation. She notes that her average episodes of diarrhea is 4 times a day, but not everyday.  She is currently taking loperamide 2 tablets 4 times a day to manage her diarrhea.   She reports of taking Protonix 3 times a day, but reports that it is not helping her manage her heart burns.   She reports of low energy during this visit and had questions of improving her energy, which were answered.   Patient's family notes that she is taking 7 tube feedings per day. She is not having any problems with tube feedings.   She is regularly taking Vitamin-D supplement.   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 DUCTAL CARCINOMA IN SITU./ RADIATION   CATARACT EXTRACTION W/ INTRAOCULAR LENS IMPLANT  2009   BOTH EYES   FEMUR IM NAIL Left 10/23/2017   Procedure: INTRAMEDULLARY (IM) NAIL FEMORAL;  Surgeon: Durene Romans, MD;  Location: [facility] OR;  Service:  Orthopedics;  Laterality: Left;   FOOT SURGERY  1070   RIGHT FOOT - PINCHED NERVE   IR GASTROSTOMY TUBE MOD SED  01/13/2021   IR REPLACE G-TUBE SIMPLE WO FLUORO  03/08/2022   IR REPLACE G-TUBE SIMPLE WO FLUORO  11/01/2022   IR REPLC GASTRO/COLONIC TUBE PERCUT W/FLUORO  05/17/2022   LUNG SURGERY     CALCIFICATIONS, POSITIVE - CANCER LYMPHNODE    MELANOMA EXCISION  2008   CHEST AREA   NECK SURGERY  Nov 09 2011   LYMPHNODES REMOVED 2 POSITIVE    RIGHT COLECTOMY  06/02/2009   THYROIDECTOMY  11/06/2009   TONSILLECTOMY AND ADENOIDECTOMY      SOCIAL HISTORY: Social History   Socioeconomic History   Marital status: Married    Spouse name: Not on file   Number of children: Not on file   Years of education: Not on file   Highest education level: Not on file  Occupational History   Not on file  Tobacco Use  Smoking status: Former    Types: Cigarettes    Quit date: 08/10/1960    Years since quitting: 62.3   Smokeless tobacco: Never  Vaping Use   Vaping Use: Never used  Substance and Sexual Activity   Alcohol use: No    Alcohol/week: 0.0 standard drinks of alcohol   Drug use: No   Sexual activity: Never  Other Topics Concern   Not on file  Social History Narrative   Not on file   Social Determinants of Health   Financial Resource Strain: Not on file  Food Insecurity: Not on file  Transportation Needs: Not on file  Physical Activity: Not on file  Stress: Not on file  Social Connections: Not on file  Intimate Partner Violence: Not on file    FAMILY HISTORY: Family History  Problem Relation Age of Onset   Cancer Father        throat/ bone   Breast cancer Sister    Cancer Sister        MEDULAR CANCER   Cancer Brother        prostate   Cancer Other        MEDULAR CANCER   Breast cancer Maternal Aunt    Cancer Paternal Aunt        pancreatic    Cancer Cousin        ON MOTHER'S SIDE- LIVER CANCER    ALLERGIES:  is allergic to codeine.  MEDICATIONS:  Current  Outpatient Medications  Medication Sig Dispense Refill   ALPRAZolam (XANAX) 1 MG tablet Take 0.5 tablets (0.5 mg total) by mouth 2 (two) times daily. 6 tablet 0   bacitracin ophthalmic ointment      cabozantinib s-malate (COMETRIQ) capsule (3 x 20mg  kit) Take 60 mg by mouth daily. 1 kit 1   Calcium Carb-Cholecalciferol 600-200 MG-UNIT TABS 2 tablets     Calcium Carbonate-Vit D-Min (CALCIUM 600 + MINERALS PO) Take 1 tablet by mouth daily.     cetirizine (ZYRTEC) 10 MG tablet Take 10 mg by mouth daily.     Cholecalciferol (VITAMIN D) 1000 UNITS capsule Take 1,000 Units by mouth daily.     cholestyramine (QUESTRAN) 4 g packet DISSOLVE 1 PACKET IN LIQUID OF CHOICE AND DRINK 2 TIMES A DAY 60 packet 5   Cod Liver Oil 1000 MG CAPS Take 1,000 mg by mouth daily.     diphenhydramine-acetaminophen (TYLENOL PM EXTRA STRENGTH) 25-500 MG TABS tablet 1-2 tabs     Glucosamine-Chondroitin 250-200 MG CAPS Take 1 tablet by mouth daily. Name of table is SCHIFF     levothyroxine (SYNTHROID) 125 MCG tablet Take 1 tablet (125 mcg total) by mouth daily. 90 tablet 3   loperamide (IMODIUM A-D) 2 MG tablet Take 2 mg by mouth 3 (three) times daily.     Multiple Vitamin (MULTIVITAMIN) tablet Take 1 tablet by mouth daily.     ondansetron (ZOFRAN) 8 MG tablet Take 1 tablet by mouth every 8 hours as needed for nausea or vomiting. 30 tablet 0   pantoprazole (PROTONIX) 40 MG tablet TAKE 1 TABLET BY MOUTH TWICE DAILY BEFORE A MEAL 60 tablet 0   simethicone (MYLICON) 80 MG chewable tablet Chew 80 mg by mouth 3 (three) times daily.     sucralfate (CARAFATE) 1 g tablet TAKE 1 TABLET BY MOUTH 4 TIMES DAILY WITH MEALS AND AT BEDTIME 420 tablet 0   vitamin E 180 MG (400 UNITS) capsule 1 tablet     zoledronic acid (RECLAST) 5 MG/100ML  SOLN injection Inject 5 mg into the vein once.     No current facility-administered medications for this visit.    REVIEW OF SYSTEMS:   10 Point review of Systems was done is negative except as noted  above.  PHYSICAL EXAMINATION: .BP (!) 124/58 (BP Location: Left Arm, Patient Position: Sitting) Comment: nurse notified  Pulse (!) 110   Temp 97.8 F (36.6 C) (Temporal)   Resp 15   Ht 5\' 3"  (1.6 m)   Wt 90 lb 6.4 oz (41 kg)   SpO2 97%   BMI 16.01 kg/m  . GENERAL:alert, in no acute distress and comfortable SKIN: no acute rashes, no significant lesions EYES: conjunctiva are pink and non-injected, sclera anicteric OROPHARYNX: MMM, no exudates, no oropharyngeal erythema or ulceration NECK: supple, no JVD LYMPH:  no palpable lymphadenopathy in the cervical, axillary or inguinal regions LUNGS: clear to auscultation b/l with normal respiratory effort HEART: regular rate & rhythm ABDOMEN:  normoactive bowel sounds , non tender, not distended. Extremity: no pedal edema PSYCH: alert & oriented x 3 with fluent speech NEURO: no focal motor/sensory deficits   LABORATORY DATA:  I have reviewed the data as listed     Latest Ref Rng & Units 03/02/2022   12:06 PM 11/03/2021    1:01 PM 10/12/2021    2:39 PM  CBC  WBC 4.0 - 10.5 K/uL 6.2  4.8  4.7   Hemoglobin 12.0 - 15.0 g/dL 13.3  12.9  11.8   Hematocrit 36.0 - 46.0 % 40.7  38.8  36.0   Platelets 150 - 400 K/uL 220  217  239    .    Latest Ref Rng & Units 03/02/2022   12:06 PM 11/03/2021    1:01 PM 10/12/2021    2:39 PM  CMP  Glucose 70 - 99 mg/dL 134  93  95   BUN 8 - 23 mg/dL 18  18  18    Creatinine 0.44 - 1.00 mg/dL 0.59  0.60  0.73   Sodium 135 - 145 mmol/L 138  139  142   Potassium 3.5 - 5.1 mmol/L 4.2  4.4  4.3   Chloride 98 - 111 mmol/L 101  101  103   CO2 22 - 32 mmol/L 27  33  28   Calcium 8.9 - 10.3 mg/dL 9.2  9.5  9.1   Total Protein 6.5 - 8.1 g/dL  7.1  6.7   Total Bilirubin 0.3 - 1.2 mg/dL  0.8  0.7   Alkaline Phos 38 - 126 U/L  82  86   AST 15 - 41 U/L  20  20   ALT 0 - 44 U/L  14  15         Component     Latest Ref Rng & Units 09/22/2021  T4,Free(Direct)     0.61 - 1.12 ng/dL 1.78 (H)  TSH     0.308 -  3.960 uIU/mL 2.572  Phosphorus     2.5 - 4.6 mg/dL 3.2  Magnesium     1.7 - 2.4 mg/dL 1.9   PATHOLOGY  Lung Biopsy at [facility] 09/23/14 DIAGNOSIS A. Pleural nodule, biopsy:  Calcified and hyalinized remote fat necrosis. No evidence of malignancy.   B. Level 9 lymph node, biopsy:  Metastatic medullary thyroid carcinoma. See comment.  Comment:: A note is made of the patient's history of medullary thyroid carcinoma with documented prior metastatic disease to cervical lymph nodes ([id number]; 04/05/2010).  The prior metastases to cervical lymph nodes are  reviewed in conjunction to the current case and the tumors are morphologically similar.   Confirmatory immunohistochemical stains are performed. Tumor cells stain positively for calcitonin and for an anticytokeratin cocktail. The morphologic and immunophenotypic findings support metastatic medullary thyroid carcinoma.   C. Level 8 lymph node, biopsy:  Profiles of lymph nodes, negative for malignancy.   D. Pleural nodule #2, biopsy:  Fibrovascular tissue with extensive cauterization. No evidence of malignancy.        Surgical Pathology at [facility] [place] 11/08/12 Immunohistochemical Findings An immunohistochemical stain for calcitonin obtained on paraffin block A1 is positive.    Diagnosis A. "RIGHT LEVEL 6 LYMPH NODES" (BIOPSY):      METASTATIC MEDULLARY THYROID CARCINOMA IN TWO LYMPH NODES (2/2).     SIZE OF LARGEST METASTASIS:  2 CM.     EXTRANODAL INVASION:  PRESENT. Comment:  I certify that I personally conducted the diagnostic evaluation of the above  specimen(s) and have rendered the above diagnosis(es).                             Rex C. Telford Nab, M.D. Telephone:[phone]                             Electronically signed: 11/16/12   A. "Right level 6 lymph nodes", received fresh and placed in formalin on 1/9/ two fragments of tan-red soft tissue.  Fragment 1 is 1.3 x 1 x 0.6 cm and is bisected and submitted in block A1.   Fragment 2 is 2 x 1 x 0.4 cm and is submitted entirely in block A2.    RADIOGRAPHIC STUDIES: I have personally reviewed the radiological images as listed and agreed with the findings in the report. DG ABDOMEN PEG TUBE LOCATION  Result Date: 11/01/2022 CLINICAL DATA:  Placement of new PEG tube EXAM: ABDOMEN - 1 VIEW COMPARISON:  10/30/2022 FINDINGS: Contrast injected through PEG tube is seen in the lumen of stomach and proximal small bowel loops. There is no extravasation of contrast. Bowel gas pattern is nonspecific. There are metallic densities in left lower lung field, possibly calcifications or aspiration of barium. There is previous internal fixation in left femur. IMPRESSION: Tip of PEG tube is seen in the lumen of stomach. Electronically Signed   By: Elmer Picker M.D.   On: 11/01/2022 12:56   IR REPLACE G-TUBE SIMPLE WO FLUORO  Result Date: 11/01/2022 INDICATION: 82 year old female with dislodged gastrostomy tube. Request for G-tube replacement. EXAM: REPLACEMENT OF GASTROSTOMY TUBE MEDICATIONS: NONE COMPLICATIONS: None immediate. PROCEDURE: Informed verbal consent was obtained from the patient after a thorough discussion of the procedural risks, benefits and alternatives. All questions were addressed. Retention balloon was deflated and existing Foley catheter was removed and exchanged for a new 18-French balloon inflatable gastrostomy tube. The balloon was inflated with 8 mL saline and disc was cinched. Postprocedural KUB with contrast showed the new G-tube in appropriate position. The patient tolerated the procedure well without immediate postprocedural complication. IMPRESSION: Successful replacement of a new 18-French gastrostomy tube. The new gastrostomy tube is ready for immediate use. Read by: Durenda Guthrie, PA-C Electronically Signed   By: Corrie Mckusick D.O.   On: 11/01/2022 12:47   DG Abdomen 1 View  Result Date: 10/30/2022 CLINICAL DATA:  Gastrografin irrigation for G-tube  placement. EXAM: ABDOMEN - 1 VIEW COMPARISON:  X-ray abdomen 03/08/2022 FINDINGS: Gastrostomy tube overlying the left mid abdomen with PO contrast  administered via the tube partially opacifying the stomach. The bowel gas pattern is normal. No radio-opaque calculi or other significant radiographic abnormality are seen. IMPRESSION: Gastrostomy tube in good position. Electronically Signed   By: Tish Frederickson M.D.   On: 10/30/2022 20:52      CT Chest w Contrast w 3D MIPS Protocol at [facility] 09/29/17 Impression: 1.  Mildly enlarged mediastinal lymph nodes are stable when compared to most recent CT. Although, several nodes are minimally increased from 03/08/2016.  2.  Refer to separately dictated abdominal CT report for findings below the diaphragm.  Bone Scan Whole Body at [facility] 09/27/17 Impression: 1. Stable foci of increased radiotracer activity in the right frontal calvarium, indeterminate. 2. No new scintigraphic evidence of osseous metastasis.  US Lymph Node Neck Mapping at [facility] 08/29/17 Impression: 1.  Newly measured right level 5 lymph node (lymph node #5) with abnormal rounded morphology, no echogenic hilum, and microcalcifications. Although biopsy of this nodule may be technically difficult due to its location deep to the carotid vasculature, ultrasound-guided biopsy is recommended (if possible).  2.  Newly measured right level 2A lymph node without suspicious features.  3.  Additional right neck nodes are unchanged.   Bone Density Scan 02/27/17 @ [facility]  Lowest Site Measured: Total Left hip with T-score of -3.1 and BMD of 0.568   CT CAP W Contrast W MIPS at [facility] 02/13/17 Impression: 1. Please see CT neck completed same day for findings in the neck. 2. Stable nonenlarged mediastinal lymph nodes and soft tissue nodule in the right thyroidectomy bed. 3. Unchanged irregular opacities in the lung apices bilaterally, which may represent treated metastases or scarring. No new metastatic  disease in the chest, abdomen, or pelvis.  CT Nect Soft Tissue W Contrast at [facility] 02/13/17 IMPRESSION: Numerous soft tissue enhancing lesions in the superior mediastinum. While these are stable when compared to most recent CT, several of these lesions demonstrate minimal growth from the more remote exam on 09/07/2015. No new enhancing lesions are present.  ASSESSMENT & PLAN:  MANILA ROMMEL is a 82 y.o. caucasian female with   1. 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  She had a level 6 LN dissection on 11/08/12 with 2/2 LN positive, largest 2cm for MTC.  09/05/14 CAP CT with development of new calcified nodules in the left lower lobe 09/23/14 Lung biopsy showed Level 9 lymph node, positive for malignancy and metastatic cancer which was resected. Treatment for symptomatic disease with vandetanib and cabozantibnib were previously discussed with her [facility]ologist but the patient had decided to hold off on treatment due to concerns for toxicities and lack of overt cancer related progressive symptoms. Latest scans from 08/2017 are stable, no disease found below chest or in the bones. Her calcitonin levels have been increasing since 2015. Overall pt currently asymptomatic.   CT chest on 10/17/2018 which revealed "1. Unchanged mediastinal and right supraclavicular lymphadenopathy. Findings are nonspecific, however metastatic disease is not excluded. 2. Stable biapical scarring and right apical consolidative focus with associated calcification, likely postinfectious in nature."  10/11/2018 CT angiography chest w contrast revealed "1. No demonstrable pulmonary embolus. No thoracic aortic aneurysm or dissection. There are foci of aortic atherosclerosis as well as great vessel and coronary artery calcifications. 2. Fibrosis and  cicatrization in the apices with apical pleural calcification, likely due to scarring. There is  focal airspace opacity which is asymmetric in the apical segment of the right upper lobe. This finding may well be due to chronic scarring. A potential focus of pneumonia or possible neoplastic involvement in this area must be of concern. This finding may well warrant correlation with nuclear medicine PET study to assess for abnormal metabolic activity. At a minimum, a follow-up chest CT in 3 months to assess for stability of this area would be advised. No other airspace consolidation noted. Areas of patchy atelectasis noted. No well-defined nodular type lesions noted in the lung parenchyma. 3. Probable sclerotic bony metastases at C3 and L1. Sclerotic focus at L1 is incompletely visualized. Medullary thyroid carcinoma as well as breast carcinoma potentially may present with sclerotic as opposed to lytic metastases. 4.  No demonstrable thoracic adenopathy. 5. Extensive postoperative change in the thyroid region with evidence of total thyroidectomy. 6. Mild reflux into the inferior vena cava and hepatic veins suggests increase in right heart pressure. Aortic Atherosclerosis (ICD10-I70.0)."  12/06/2019 CT C/A/P ([id number]) ([id number]) revealed "1. Stable exam. No new or progressive findings to suggest progressive metastatic disease. 2. Scattered sclerotic bone lesions, similar to prior. 3. Stable mild intrahepatic and extrahepatic biliary duct dilatation. 4. Trace free fluid in the cul-de-sac."  12/06/2019 CT Soft Tissue Neck ([id number]) revealed "1. Thyroidectomy with nodal metastases at the thoracic inlet/anterior compartment and right supraclavicular fossa. There are no prior neck CT comparison is available for review. 2. 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 Borderline enlarged supraclavicular and mediastinal  lymph nodes, stable. 2. Osseous metastatic disease, stable. 3. Left lower lobe aspiration and trace left pleural fluid. 4. Hepatomegaly. 5. Aortic atherosclerosis."  04/06/2022 PET CT ([id number]) revealed "1. Relatively similar volume nodal metastasis within the low neck and chest. 2. Hypermetabolic osseous metastasis, felt to be minimally progressive. 3. Incidental findings, including: Aortic atherosclerosis (ICD10-I70.0), coronary artery atherosclerosis and emphysema (ICD10-J43.9). Left mastoid effusion."  2. 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  Grade 3 fatigue related to the Cometriq at 60 mg p.o. daily-has been off Cometriq since 09/22/2021   4. Osteoporosis - Last DEXA 02/27/17 with Lowest Site Measured: Total Left hip with T-score of -3.1 and BMD of 0.568 Closed left hip fracture from fall -s/p surgery on 10/23/17 with rod placement - healing well.  Continue osteoporosis management with Zometa  5. Chronic oropharyngeal Dysphagia ? Related to previous neck surgery. Dry mouth etc. No acute changes  -Continue tube feeding  PLAN:  -Discussed the lab results from today, 11/14/2022, with the patient and her family member. CBC and CMP stable.  Calcitonin levels significantly increased to 86k -Discussed the option to start carafate in addition to Protonix.  -Recommended getting more help at home for her and her husband which can reduce her stress.  -Answered all of patient's questions.  -Advised patient to increase nutrient intake and eat more to help her increase her energy.  -Recommended receiving influenza vaccine, COVID-19 Booster, and RSV vaccine. Recommended to stay up to date with age  appropriate vaccines.  -Diarrhea is well controlled with as needed Imodium. Continue to follow-up with endocrinology for continued management and replacement of levothyroxine. -Her weight is stable and well  maintained with tube feeding at this time. -Patient will be taking sucralfate from now for heart burn.  -Plan on CT neck/CAP in 5 months before our next visit.   FOLLOW UP: CT neck/CAP in 5 months RTC with Dr Candise Che with labs in 6 months  The total time spent in the appointment was 30 minutes* .  All of the patient's questions were answered with apparent satisfaction. The patient knows to call the clinic with any problems, questions or concerns.   Wyvonnia Lora MD MS AAHIVMS [facility] Hematology/Oncology Physician [facility]  .*Total Encounter Time as defined by the Centers for Medicare and Medicaid Services includes, in addition to the face-to-face time of a patient visit (documented in the note above) non-face-to-face time: obtaining and reviewing outside history, ordering and reviewing medications, tests or procedures, care coordination (communications with other health care professionals or caregivers) and documentation in the medical record.   I, Ok Edwards, am acting as a Neurosurgeon for Wyvonnia Lora, MD. .I have reviewed the above documentation for accuracy and completeness, and I agree with the above. Johney Maine MD

## 2022-11-16 LAB — CALCITONIN: Calcitonin: 86337 pg/mL — ABNORMAL HIGH (ref 0.0–5.0)

## 2022-11-20 ENCOUNTER — Encounter: Payer: Self-pay | Admitting: Hematology

## 2022-11-25 ENCOUNTER — Inpatient Hospital Stay: Payer: Medicare Other | Admitting: Nutrition

## 2022-11-25 NOTE — Progress Notes (Signed)
Received phone message from [place] regarding tube feeding. (Patient's alternate contact.)  Patient able to increase Osmolite 1.5-7 cartons daily.  She was still using 2 cartons of equate plus in place of 2 cartons Osmolite 1.5 due to supply shortage.  [facility] still has not delivered formula for patient.  Dr. Encarnacion Slates office aware.  Patient had 1 episode of loose stools last night after a feeding and 1 episode of loose stools this morning after a feeding.  This has not happened prior to yesterday.  Patient has been increasing tube feeding for approximately 2 weeks without increased diarrhea.  Patient's weight on home scale has increased to 92.2 pounds from 90 pounds 6.4 ounces January 15.  Patient was pleased with weight gain.  Nutrition diagnosis: Unintentional weight loss improving.  Intervention: Educated to ensure patient's Osmolite 1.5 is given at room temperature.  Recommended slowing down feedings to last over 20 minutes.  Keep a journal to document when feedings are given and when patient has diarrhea.  Continue medications as prescribed by MD.   Will provide an additional 2 complementary cases of Osmolite 1.5.  Romelle Starcher will pick up today. Have encouraged Bethany to contact Dr. Encarnacion Slates office to be sure home health agency has agreed to supply Osmolite 1.5.  Monitoring, evaluation, goals: Patient will tolerate tube feeding at goal rate to promote weight maintenance/weight gain.  Next visit: Will attempt phone follow-up on Monday January 9.  Patient is scheduled to come in for nutrition follow-up on February 12 for weight check.  **Disclaimer: This note was dictated with voice recognition software. Similar sounding words can inadvertently be transcribed and this note may contain transcription errors which may not have been corrected upon publication of note.**

## 2022-11-28 ENCOUNTER — Inpatient Hospital Stay: Payer: Medicare Other | Admitting: Nutrition

## 2022-11-28 NOTE — Progress Notes (Signed)
Telephone follow-up completed with [facility].  Reports tube feeding order was received on Saturday late afternoon.  States they received a 21-day supply.  Tube feeding was slowed and given over 40 minutes with no diarrhea after.  They then changed infusion time over 25 minutes with only 1 semisolid stool.  Currently, patient seems to be tolerating feedings over 20 minutes without immediate diarrhea.  Patient to continue 7 cartons of Osmolite 1.5 daily spread over 5 feedings.  Patient will come in for weight check on Monday, February 12.  **Disclaimer: This note was dictated with voice recognition software. Similar sounding words can inadvertently be transcribed and this note may contain transcription errors which may not have been corrected upon publication of note.**

## 2022-12-12 ENCOUNTER — Inpatient Hospital Stay: Payer: Medicare Other | Attending: Hematology | Admitting: Nutrition

## 2022-12-12 ENCOUNTER — Other Ambulatory Visit: Payer: Self-pay

## 2022-12-12 NOTE — Progress Notes (Signed)
Nutrition follow-up completed with patient and Bethany.  Patient has been tolerating 7 cartons of Osmolite 1.5 daily.  This is spread over 5 feedings.  She is tolerating free water flushes.  Reports she is drinking 12 ounces of water by mouth.  Weight improved and was documented as 95.8 pounds today.  This is increased from 90 pounds 6.4 ounces January 15.  Patient still is having difficulty with tube feeding delivery through [facility].  Dr. Therisa Doyne is writing these orders so advised patient to contact Dr. Encarnacion Slates office and asked them to fax tube feeding orders 1 more time.  It may be helpful for Dr. Encarnacion Slates office to confirm fax was received.  Patient is having ongoing episodes of some loose stools however this is no different than she has always had.  This does not seem to be related to tube feeding.  Patient to continue tube feeding and free water flushes as written.  No follow-up needed at this time.  **Disclaimer: This note was dictated with voice recognition software. Similar sounding words can inadvertently be transcribed and this note may contain transcription errors which may not have been corrected upon publication of note.**

## 2023-01-06 ENCOUNTER — Other Ambulatory Visit: Payer: Self-pay

## 2023-01-06 DIAGNOSIS — C73 Malignant neoplasm of thyroid gland: Secondary | ICD-10-CM

## 2023-01-06 MED ORDER — ONDANSETRON HCL 8 MG PO TABS: 8.0000 mg | ORAL_TABLET | Freq: Three times a day (TID) | ORAL | 0 refills | Status: DC | PRN

## 2023-01-16 ENCOUNTER — Emergency Department (HOSPITAL_BASED_OUTPATIENT_CLINIC_OR_DEPARTMENT_OTHER)
Admission: EM | Admit: 2023-01-16 | Discharge: 2023-01-16 | Disposition: A | Discharge status: Home / Self Care | Payer: Medicare Other | Attending: Emergency Medicine | Admitting: Emergency Medicine

## 2023-01-16 ENCOUNTER — Other Ambulatory Visit: Payer: Self-pay

## 2023-01-16 ENCOUNTER — Ambulatory Visit: Payer: Medicare Other | Admitting: Internal Medicine

## 2023-01-16 ENCOUNTER — Emergency Department (HOSPITAL_BASED_OUTPATIENT_CLINIC_OR_DEPARTMENT_OTHER): Payer: Medicare Other

## 2023-01-16 ENCOUNTER — Encounter (HOSPITAL_BASED_OUTPATIENT_CLINIC_OR_DEPARTMENT_OTHER): Payer: Self-pay

## 2023-01-16 DIAGNOSIS — R296 Repeated falls: Secondary | ICD-10-CM | POA: Insufficient documentation

## 2023-01-16 DIAGNOSIS — M549 Dorsalgia, unspecified: Secondary | ICD-10-CM | POA: Insufficient documentation

## 2023-01-16 DIAGNOSIS — W010XXA Fall on same level from slipping, tripping and stumbling without subsequent striking against object, initial encounter: Secondary | ICD-10-CM | POA: Insufficient documentation

## 2023-01-16 MED ORDER — HYDROCODONE-ACETAMINOPHEN 5-325 MG PO TABS: 1.0000 | ORAL_TABLET | Freq: Four times a day (QID) | ORAL | 0 refills | Status: DC | PRN

## 2023-01-16 NOTE — ED Triage Notes (Signed)
Patient here POV from [facility].  Endorses 2 Recent Falls. One 2 Nights ago and then another the day afterwards. States both times she slipped and fell backwards onto the floor. Pain is located to left thoracic back.   No head Injury or LOC. No Anticoagulants.   Sent for CT imaging. Had an unremarkable XR.  NAD noted during Triage. A&Ox4. Gcs 15. BIB Wheelchair.

## 2023-01-16 NOTE — ED Provider Notes (Signed)
[name] Provider Note   CSN: [id number] Arrival date & time: 01/16/23  1359     History {Add pertinent medical, surgical, social history, OB history to HPI:1} Chief Complaint  Patient presents with   Shannon [name] is a 82 y.o. female.  Level 5 caveat secondary to memory issues.  She has a history of cancer.  She has had a couple of falls over the last few days with complaint of severe upper back pain.  Went to urgent care where they did some x-rays and told her she had osteoporosis.  She has been using Tylenol for pain.  No focal numbness or weakness no vomiting diarrhea urinary incontinence.  She usually ambulates without any assistance.  She is brought in by her family members who are assisting with history.  The history is provided by the patient.  Fall This is a new problem. The current episode started more than 2 days ago. The problem has not changed since onset.Pertinent negatives include no chest pain, no abdominal pain, no headaches and no shortness of breath. The symptoms are aggravated by bending, twisting and walking. Nothing relieves the symptoms. She has tried acetaminophen and rest for the symptoms. The treatment provided no relief.       Home Medications Prior to Admission medications   Medication Sig Start Date End Date Taking? Authorizing Provider  ALPRAZolam Duanne Moron) 1 MG tablet Take 0.5 tablets (0.5 mg total) by mouth 2 (two) times daily. 10/27/17   Nita Sells, MD  bacitracin ophthalmic ointment  08/15/20   [provider]  Calcium Carb-Cholecalciferol 600-200 MG-UNIT TABS 2 tablets    [provider]  Calcium Carbonate-Vit D-Min (CALCIUM 600 + MINERALS PO) Take 1 tablet by mouth daily.    [provider]  cetirizine (ZYRTEC) 10 MG tablet Take 10 mg by mouth daily.    [provider]  Cholecalciferol (VITAMIN D) 1000 UNITS capsule Take 1,000 Units by mouth daily.     [provider]  cholestyramine (QUESTRAN) 4 g packet DISSOLVE 1 PACKET IN LIQUID OF CHOICE AND DRINK 2 TIMES A DAY 04/09/21   Shamleffer, Melanie Crazier, MD  Cod Liver Oil 1000 MG CAPS Take 1,000 mg by mouth daily.    [provider]  diphenhydramine-acetaminophen (TYLENOL PM EXTRA STRENGTH) 25-500 MG TABS tablet 1-2 tabs    [provider]  Glucosamine-Chondroitin 250-200 MG CAPS Take 1 tablet by mouth daily. Name of table is SCHIFF 12/23/10   [provider]  levothyroxine (SYNTHROID) 125 MCG tablet Take 1 tablet (125 mcg total) by mouth daily. 01/18/22   Shamleffer, Melanie Crazier, MD  loperamide (IMODIUM A-D) 2 MG tablet Take 2 mg by mouth 3 (three) times daily.    [provider]  Multiple Vitamin (MULTIVITAMIN) tablet Take 1 tablet by mouth daily.    [provider]  ondansetron (ZOFRAN) 8 MG tablet Take 1 tablet by mouth every 8 hours as needed for nausea or vomiting. 01/06/23   Brunetta Genera, MD  pantoprazole (PROTONIX) 40 MG tablet TAKE 1 TABLET BY MOUTH TWICE DAILY BEFORE A MEAL 03/31/22   Brunetta Genera, MD  simethicone [facility]) 80 MG chewable tablet Chew 80 mg by mouth 3 (three) times daily.    [provider]  sucralfate (CARAFATE) 1 g tablet TAKE 1 TABLET BY MOUTH 4 TIMES DAILY WITH MEALS AND AT BEDTIME 11/14/22   Brunetta Genera, MD  vitamin E 180 MG (400  UNITS) capsule 1 tablet    [provider]  zoledronic acid (RECLAST) 5 MG/100ML SOLN injection Inject 5 mg into the vein once.    [provider]      Allergies    Codeine    Review of Systems   Review of Systems  Constitutional:  Negative for fever.  Respiratory:  Negative for shortness of breath.   Cardiovascular:  Negative for chest pain.  Gastrointestinal:  Negative for abdominal pain.  Musculoskeletal:  Positive for back pain. Negative for neck pain.  Neurological:  Negative for weakness, numbness and headaches.     Physical Exam Updated Vital Signs BP (!) 149/88 (BP Location: Right Arm)   Pulse (!) 113   Temp (!) 97.5 F (36.4 C) (Temporal)   Resp 20   Ht 5\' 3"  (1.6 m)   Wt 43.5 kg   SpO2 96%   BMI 16.99 kg/m  Physical Exam Vitals and nursing note reviewed.  Constitutional:      General: She is not in acute distress.    Appearance: Normal appearance. She is well-developed.  HENT:     Head: Normocephalic and atraumatic.  Eyes:     Conjunctiva/sclera: Conjunctivae normal.  Cardiovascular:     Rate and Rhythm: Normal rate and regular rhythm.     Heart sounds: No murmur heard. Pulmonary:     Effort: Pulmonary effort is normal. No respiratory distress.     Breath sounds: Normal breath sounds.  Abdominal:     Palpations: Abdomen is soft.     Tenderness: There is no abdominal tenderness. There is no guarding or rebound.  Musculoskeletal:        General: No swelling.     Cervical back: Neck supple.     Comments: She has tenderness of her upper thoracic spine a little over towards the left side.  No specific step-offs.  No overlying skin changes.  Lumbar spine nontender.  Skin:    General: Skin is warm and dry.     Capillary Refill: Capillary refill takes less than 2 seconds.  Neurological:     General: No focal deficit present.     Mental Status: She is alert.     Sensory: No sensory deficit.     Motor: No weakness.     ED Results / Procedures / Treatments   Labs (all labs ordered are listed, but only abnormal results are displayed) Labs Reviewed - No data to display  EKG None  Radiology No results found.  Procedures Procedures  {Document cardiac monitor, telemetry assessment procedure when appropriate:1}  Medications Ordered in ED Medications - No data to display  ED Course/ Medical Decision Making/ A&P   {   Click here for ABCD2, HEART and other calculatorsREFRESH Note before signing :1}                          Medical Decision Making Amount and/or  Complexity of Data Reviewed Radiology: ordered.   This patient complains of ***; this involves an extensive number of treatment Options and is a complaint that carries with it a high risk of complications and morbidity. The differential includes ***  I ordered, reviewed and interpreted labs, which included *** I ordered medication *** and reviewed PMP when indicated. I ordered imaging studies which included *** and I independently    visualized and interpreted imaging which showed *** Additional history obtained from *** Previous records obtained and reviewed *** I consulted *** and  discussed lab and imaging findings and discussed disposition.  Cardiac monitoring reviewed, *** Social determinants considered, *** Critical Interventions: ***  After the interventions stated above, I reevaluated the patient and found *** Admission and further testing considered, ***   {Document critical care time when appropriate:1} {Document review of labs and clinical decision tools ie heart score, Chads2Vasc2 etc:1}  {Document your independent review of radiology images, and any outside records:1} {Document your discussion with family members, caretakers, and with consultants:1} {Document social determinants of health affecting pt's care:1} {Document your decision making why or why not admission, treatments were needed:1} Final Clinical Impression(s) / ED Diagnoses Final diagnoses:  None    Rx / DC Orders ED Discharge Orders     None

## 2023-01-16 NOTE — Discharge Instructions (Signed)
You were seen in the emergency department for continued upper back pain after a fall.  You had a CAT scan of your cervical thoracic and lumbar spine that showed significant bony disease from cancer but did not show any acute fractures.  Please continue Tylenol for pain and you can use warm compress to the area.  We are prescribing hydrocodone for breakthrough pain, please use caution with this as it can make you constipated nauseous dizzy.  Follow-up with your regular doctor.  Return to the emergency department if any worsening or concerning symptoms.

## 2023-01-16 NOTE — Progress Notes (Deleted)
Name: Shannon [name]  MRN/ DOB: [id number], 10-19-1941    Age/ Sex: 82 y.o., female     PCP: Shannon Halim., PA-C   Reason for Endocrinology Evaluation: MEN2A/Medullary Thyroid Cancer     Initial Endocrinology Clinic Visit: 07/15/2019    PATIENT IDENTIFIER: [name] is a 82 y.o., female with a past medical history of MEN2, osteoporosis and post-surgical hypothyroidism. She has followed with [facility] since 07/15/2019 for consultative assistance with management of her MEN2A.        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    S/P B/L neck dissection 03/2010 S/P redo dissection of the right level VI L.N in 10/2012, largest 2 cm due to mets In 2015  Had a stable right lateral compartment level IV and V L.N . The cystic right level IV L.N previously biopsied 12/2010 negative for mets.  S/P thoracoscopy (08/2014) level IX L.N with medullary mets.      She has had diarrhea in 2010, started on Imodium for years. Somatostatin was not found to be effective.    In 08/2018 was found to have an endometrial mass on MRI, further testing was benign.    She used to follow up at [facility], serial testing has come back negative for pheo or hypercalcemia.      Over the years her calcitonin and CEA levels have been gradually increasing.    She continues with chronic diarrhea, diarrhea has been there since 2010. Treatment was started some time in 2013. She has been on loperamide for years    Stool consistency has improved with consuming protein shakes.     She has been diagnosed with osteoporosis in 2016 and has been on Reclast since then. It was recommended by Hem/Onc to switch zoledrinic acid from annually to Q 3 month due to bone mets     G- tube 12/2020   She was tried on Cometriq 2022 but caused fatigue   SUBJECTIVE:    Today (01/16/2023):  Shannon [name] is here for f/u on  metastatic medullary carcinoma and MEN 2A  She continues to follow-up with Dr. 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 DUCTAL CARCINOMA IN SITU./ RADIATION   CATARACT EXTRACTION W/ INTRAOCULAR LENS IMPLANT  2009   BOTH EYES   FEMUR IM NAIL Left 10/23/2017   Procedure: INTRAMEDULLARY (IM) NAIL FEMORAL;  Surgeon: Paralee Cancel, MD;  Location: [place];  Service: Orthopedics;  Laterality: Left;   FOOT SURGERY  1070   RIGHT FOOT - PINCHED  NERVE   IR GASTROSTOMY TUBE MOD SED  01/13/2021   IR REPLACE G-TUBE SIMPLE WO FLUORO  03/08/2022   IR REPLACE G-TUBE SIMPLE WO FLUORO  11/01/2022   IR [place] GASTRO/COLONIC TUBE PERCUT W/FLUORO  05/17/2022   LUNG SURGERY     CALCIFICATIONS, POSITIVE - CANCER LYMPHNODE    MELANOMA EXCISION  2008   CHEST AREA   NECK SURGERY  Nov 09 2011   LYMPHNODES REMOVED 2 POSITIVE    RIGHT COLECTOMY  06/02/2009   THYROIDECTOMY  11/06/2009   TONSILLECTOMY AND ADENOIDECTOMY     Social History:  reports that she quit smoking about 62 years ago. Her smoking use included cigarettes. She has never used smokeless tobacco. She reports that she does not drink alcohol and does not use drugs. Family History:  Family History  Problem Relation  Age of Onset   Cancer Father        throat/ bone   Breast cancer Sister    Cancer Sister        MEDULAR CANCER   Cancer Brother        prostate   Cancer Other        MEDULAR CANCER   Breast cancer Maternal Aunt    Cancer Paternal Aunt        pancreatic    Cancer Cousin        ON MOTHER'S SIDE- LIVER CANCER     HOME MEDICATIONS: Allergies as of 01/16/2023       Reactions   Codeine Other (See Comments)   hyper Other reaction(s): insomnia        Medication List        Accurate as of January 16, 2023  9:28 AM. If you have any questions, ask your nurse or doctor.          ALPRAZolam 1 MG tablet Commonly known as: XANAX Take 0.5 tablets (0.5 mg total) by mouth 2 (two) times daily.   bacitracin ophthalmic ointment   CALCIUM 600 + MINERALS PO Take 1 tablet by mouth daily.   Calcium Carb-Cholecalciferol 600-200 MG-UNIT Tabs 2 tablets   cetirizine 10 MG tablet Commonly known as: ZYRTEC Take 10 mg by mouth daily.   cholestyramine 4 g packet Commonly known as: QUESTRAN DISSOLVE 1 PACKET IN LIQUID OF CHOICE AND DRINK 2 TIMES A DAY   Cod Liver Oil 1000 MG Caps Take 1,000 mg by mouth daily.   Glucosamine-Chondroitin 250-200 MG Caps Take 1 tablet by mouth daily. Name of table is SCHIFF   levothyroxine 125 MCG tablet Commonly known as: SYNTHROID Take 1 tablet (125 mcg total) by mouth daily.   loperamide 2 MG tablet Commonly known as: IMODIUM A-D Take 2 mg by mouth 3 (three) times daily.   multivitamin tablet Take 1 tablet by mouth daily.   ondansetron 8 MG tablet Commonly known as: ZOFRAN Take 1 tablet by mouth every 8 hours as needed for nausea or vomiting.   pantoprazole 40 MG tablet Commonly known as: PROTONIX TAKE 1 TABLET BY MOUTH TWICE DAILY BEFORE A MEAL   simethicone 80 MG chewable tablet Commonly known as: MYLICON Chew 80 mg by mouth 3 (three) times daily.   sucralfate 1 g tablet Commonly known as: CARAFATE TAKE 1 TABLET BY MOUTH 4 TIMES  DAILY WITH MEALS AND AT BEDTIME   Tylenol PM Extra Strength 25-500 MG Tabs tablet Generic drug: diphenhydramine-acetaminophen 1-2 tabs   Vitamin D 1000 units capsule Take 1,000 Units by mouth daily.   vitamin E  180 MG (400 UNITS) capsule 1 tablet   zoledronic acid 5 MG/100ML Soln injection Commonly known as: RECLAST Inject 5 mg into the vein once.          OBJECTIVE:   PHYSICAL EXAM: VS: There were no vitals taken for this visit.   EXAM: General: Pt appears well and is in NAD  Neck: General: Supple without adenopathy. Thyroid: Thyroid surgically removed, no adenopathy noted.   Lungs: Clear with good BS bilat with no rales, rhonchi, or wheezes  Heart: Auscultation: RRR.  Abdomen: Soft with epigastric discomfort   Extremities:  BL LE: No pretibial edema normal ROM and strength.  Mental Status: Judgment, insight: Intact Orientation: Oriented to time, place, and person Mood and affect: No depression, anxiety, or agitation     DATA REVIEWED:   Latest Reference Range & Units 03/02/22 12:06  CEA ([facility]) 0.00 - 5.00 ng/mL 207.45 (H)  (H): Data is abnormally high  Latest Reference Range & Units 11/14/22 11:14  Calcitonin 0.0 - 5.0 pg/mL 86,337.0 (H)  (H): Data is abnormally high  Latest Reference Range & Units 11/14/22 11:14  Sodium 135 - 145 mmol/L 142  Potassium 3.5 - 5.1 mmol/L 4.1  Chloride 98 - 111 mmol/L 104  CO2 22 - 32 mmol/L 31  Glucose 70 - 99 mg/dL 79  BUN 8 - 23 mg/dL 31 (H)  Creatinine 0.44 - 1.00 mg/dL 0.51  Calcium 8.9 - 10.3 mg/dL 9.5  Anion gap 5 - 15  7  Alkaline Phosphatase 38 - 126 U/L 91  Albumin 3.5 - 5.0 g/dL 3.9  AST 15 - 41 U/L 22  ALT 0 - 44 U/L 23  Total Protein 6.5 - 8.1 g/dL 7.0  Total Bilirubin 0.3 - 1.2 mg/dL 0.6  GFR, Est Non African American >60 mL/min >60     PET scan 04/06/2022  FINDINGS: Mediastinal blood pool activity: SUV max 1.7   Liver activity: SUV max NA   NECK: Right supraclavicular node measures 8  mm and a S.U.V. max of 4.3 versus 11 mm and a S.U.V. max of 5.9 on the prior exam.   Muscular activity throughout the neck. Previous left TMJ activity is less impressive.   Incidental CT findings: Carotid atherosclerosis. Thyroidectomy. Left mastoid effusion.   CHEST: Right axillary nodes of up to 11 mm and a S.U.V. max of 3.9 on 52/4 versus 8 mm and a S.U.V. max of 3.8 on the prior exam (when remeasured).   Calcified right paratracheal node measures 1.3 cm and a S.U.V. max of 4.4 versus 1.7 cm and a S.U.V. max of 4.0 on the prior exam (when remeasured).   No pulmonary parenchymal hypermetabolism.   Incidental CT findings: Centrilobular emphysema. Biapical pleuroparenchymal scarring. Calcifications in the left lung base likely relate to prior contrast aspiration.   Dilated esophagus. Aortic and coronary artery calcification. Cardiomegaly.   ABDOMEN/PELVIS: No abdominopelvic parenchymal or nodal hypermetabolism.   Incidental CT findings: Normal adrenal glands. Left hepatic lobe 1.0 cm cyst. Gastrostomy. Paucity of abdominopelvic fat, limiting evaluation. Abdominal aortic atherosclerosis.   SKELETON: Index sclerotic lesion within the T11 vertebral body measures a S.U.V. max of 4.0 today versus a S.U.V. max of 4.1 on the prior exam.   L3 sclerotic lesion measures a S.U.V. max of 3.4 today versus a S.U.V. max of 2.7 on the prior. Index left acetabular lesion measures 1.6 cm on 166/4 versus 1.4 cm on the prior CT. Slightly more well-defined today.   Incidental CT findings: Osteopenia.  Proximal  left femur fixation.   IMPRESSION: 1. Relatively similar volume nodal metastasis within the low neck and chest. 2. Hypermetabolic osseous metastasis, felt to be minimally progressive. 3. Incidental findings, including: Aortic atherosclerosis (ICD10-I70.0), coronary artery atherosclerosis and emphysema (ICD10-J43.9). Left mastoid effusion.    ASSESSMENT / PLAN / RECOMMENDATIONS:    MEN2A:   - Pt with medullary carcinoma ,  no clinical or biochemical evidence of pheochromocytoma or hypercalcemia  -  24-hr urinary catecholamines normal 07/2021 - Calcium is normal    2. Medullary Carcinoma:    - S/P thyroidectomy in 2011 followed by dissection and redo of the right level VI L.N in 10/2012, she is also S/P thoracoscopy (08/2014) level IX L.N with medullary mets.  - Stable chest/abdomen  imaging 07/2020 with evidence of mets in the bones as well as PET scan 12/2020 - She has been tried on Carbozantinibe for ~4 weeks but she developed severe fatigue " she felt she was going to die" I suspect this is why her Calcitonin trended down in 08/2021  -Unfortunately her calcitonin has increased from 26,000 to 86,337 pg/mL  - PET scan shows overall stability -She is scheduled for CT imaging in June 2024  3. Postoperative hypothyroidism :   - Pt is clinically euthyroid  -TSH is high again, historically she has had self reduced or discontinued LT-for replacement but today she assures me compliance -I suspect interference with PEG feeds, will increase levothyroxine and ask her to wait an hour prior to feeds   Medication:  Start levothyroxine 125 mcg daily   4. Chronic diarrhea :   -This has been improving per patient - Continue with Imodium and cholestyramine   Medications Cholestyramine 4 grams BID Loperamide 2 grams TID    5. Bone Mets:  -She is on zoledronic acid through oncology every 3 months due to bone metastases   F/U in 6 months  Labs in 3 months   Signed electronically by: Mack Guise, MD  [facility]  [facility] [address] [place], [place] [address] Phone: [phone] FAX: [phone]      CC: Amie Critchley [address] [place] [address] Phone: [phone]  Fax: [phone]   Return to Endocrinology clinic as below: Future Appointments  Date Time Provider  [place]  01/16/2023  1:40 PM Norvell Caswell, Melanie Crazier, MD [facility] None  04/21/2023 11:00 AM [facility] 2 [facility] [place]  04/21/2023 11:30 AM [facility] 2 [facility] [place]  05/15/2023  1:00 PM [facility] None  05/15/2023  1:30 PM Brunetta Genera, MD [facility] None

## 2023-01-17 ENCOUNTER — Telehealth: Payer: Self-pay

## 2023-01-17 ENCOUNTER — Other Ambulatory Visit: Payer: Self-pay | Admitting: Internal Medicine

## 2023-01-17 NOTE — Telephone Encounter (Signed)
Patient had a fall yesterday and that's why she was unable to come to appointment per voicemail.

## 2023-02-10 ENCOUNTER — Emergency Department (HOSPITAL_COMMUNITY): Payer: Medicare Other

## 2023-02-10 ENCOUNTER — Encounter (HOSPITAL_COMMUNITY): Payer: Self-pay

## 2023-02-10 ENCOUNTER — Inpatient Hospital Stay (HOSPITAL_COMMUNITY)
Admission: EM | Admit: 2023-02-10 | Discharge: 2023-02-20 | DRG: 871 | Disposition: A | Discharge status: Skilled Nursing Facility | Payer: Medicare Other | Attending: Internal Medicine | Admitting: Internal Medicine

## 2023-02-10 ENCOUNTER — Other Ambulatory Visit: Payer: Self-pay

## 2023-02-10 DIAGNOSIS — E039 Hypothyroidism, unspecified: Secondary | ICD-10-CM | POA: Diagnosis present

## 2023-02-10 DIAGNOSIS — Z85850 Personal history of malignant neoplasm of thyroid: Secondary | ICD-10-CM

## 2023-02-10 DIAGNOSIS — E43 Unspecified severe protein-calorie malnutrition: Secondary | ICD-10-CM | POA: Insufficient documentation

## 2023-02-10 DIAGNOSIS — R64 Cachexia: Secondary | ICD-10-CM | POA: Diagnosis present

## 2023-02-10 DIAGNOSIS — M810 Age-related osteoporosis without current pathological fracture: Secondary | ICD-10-CM | POA: Diagnosis present

## 2023-02-10 DIAGNOSIS — Z66 Do not resuscitate: Secondary | ICD-10-CM | POA: Diagnosis not present

## 2023-02-10 DIAGNOSIS — R6520 Severe sepsis without septic shock: Secondary | ICD-10-CM | POA: Diagnosis present

## 2023-02-10 DIAGNOSIS — K9423 Gastrostomy malfunction: Secondary | ICD-10-CM | POA: Diagnosis present

## 2023-02-10 DIAGNOSIS — Z800 Family history of malignant neoplasm of digestive organs: Secondary | ICD-10-CM

## 2023-02-10 DIAGNOSIS — E876 Hypokalemia: Secondary | ICD-10-CM | POA: Diagnosis not present

## 2023-02-10 DIAGNOSIS — G9341 Metabolic encephalopathy: Secondary | ICD-10-CM | POA: Diagnosis present

## 2023-02-10 DIAGNOSIS — R54 Age-related physical debility: Secondary | ICD-10-CM | POA: Diagnosis present

## 2023-02-10 DIAGNOSIS — R339 Retention of urine, unspecified: Secondary | ICD-10-CM | POA: Diagnosis present

## 2023-02-10 DIAGNOSIS — I110 Hypertensive heart disease with heart failure: Secondary | ICD-10-CM | POA: Diagnosis present

## 2023-02-10 DIAGNOSIS — E890 Postprocedural hypothyroidism: Secondary | ICD-10-CM | POA: Diagnosis present

## 2023-02-10 DIAGNOSIS — Z7189 Other specified counseling: Secondary | ICD-10-CM | POA: Diagnosis not present

## 2023-02-10 DIAGNOSIS — J189 Pneumonia, unspecified organism: Secondary | ICD-10-CM | POA: Diagnosis present

## 2023-02-10 DIAGNOSIS — R0603 Acute respiratory distress: Secondary | ICD-10-CM

## 2023-02-10 DIAGNOSIS — Z7401 Bed confinement status: Secondary | ICD-10-CM

## 2023-02-10 DIAGNOSIS — R410 Disorientation, unspecified: Secondary | ICD-10-CM

## 2023-02-10 DIAGNOSIS — C7951 Secondary malignant neoplasm of bone: Secondary | ICD-10-CM | POA: Diagnosis present

## 2023-02-10 DIAGNOSIS — F0394 Unspecified dementia, unspecified severity, with anxiety: Secondary | ICD-10-CM | POA: Diagnosis present

## 2023-02-10 DIAGNOSIS — Z86010 Personal history of colonic polyps: Secondary | ICD-10-CM

## 2023-02-10 DIAGNOSIS — I5043 Acute on chronic combined systolic (congestive) and diastolic (congestive) heart failure: Secondary | ICD-10-CM | POA: Diagnosis present

## 2023-02-10 DIAGNOSIS — I471 Supraventricular tachycardia, unspecified: Secondary | ICD-10-CM | POA: Diagnosis present

## 2023-02-10 DIAGNOSIS — J90 Pleural effusion, not elsewhere classified: Secondary | ICD-10-CM

## 2023-02-10 DIAGNOSIS — Z803 Family history of malignant neoplasm of breast: Secondary | ICD-10-CM

## 2023-02-10 DIAGNOSIS — Y828 Other medical devices associated with adverse incidents: Secondary | ICD-10-CM | POA: Diagnosis not present

## 2023-02-10 DIAGNOSIS — K529 Noninfective gastroenteritis and colitis, unspecified: Secondary | ICD-10-CM | POA: Diagnosis present

## 2023-02-10 DIAGNOSIS — Z85118 Personal history of other malignant neoplasm of bronchus and lung: Secondary | ICD-10-CM

## 2023-02-10 DIAGNOSIS — J9601 Acute respiratory failure with hypoxia: Secondary | ICD-10-CM

## 2023-02-10 DIAGNOSIS — A419 Sepsis, unspecified organism: Secondary | ICD-10-CM | POA: Diagnosis not present

## 2023-02-10 DIAGNOSIS — Z1152 Encounter for screening for COVID-19: Secondary | ICD-10-CM | POA: Diagnosis not present

## 2023-02-10 DIAGNOSIS — Z79890 Hormone replacement therapy (postmenopausal): Secondary | ICD-10-CM

## 2023-02-10 DIAGNOSIS — Z515 Encounter for palliative care: Secondary | ICD-10-CM

## 2023-02-10 DIAGNOSIS — Z681 Body mass index (BMI) 19 or less, adult: Secondary | ICD-10-CM | POA: Diagnosis not present

## 2023-02-10 DIAGNOSIS — Z87891 Personal history of nicotine dependence: Secondary | ICD-10-CM

## 2023-02-10 DIAGNOSIS — Z885 Allergy status to narcotic agent status: Secondary | ICD-10-CM

## 2023-02-10 DIAGNOSIS — J690 Pneumonitis due to inhalation of food and vomit: Secondary | ICD-10-CM | POA: Diagnosis present

## 2023-02-10 DIAGNOSIS — R1312 Dysphagia, oropharyngeal phase: Secondary | ICD-10-CM | POA: Diagnosis present

## 2023-02-10 DIAGNOSIS — C73 Malignant neoplasm of thyroid gland: Secondary | ICD-10-CM | POA: Diagnosis not present

## 2023-02-10 DIAGNOSIS — Z79899 Other long term (current) drug therapy: Secondary | ICD-10-CM

## 2023-02-10 DIAGNOSIS — I4891 Unspecified atrial fibrillation: Secondary | ICD-10-CM | POA: Diagnosis not present

## 2023-02-10 DIAGNOSIS — R4589 Other symptoms and signs involving emotional state: Secondary | ICD-10-CM | POA: Diagnosis not present

## 2023-02-10 DIAGNOSIS — J918 Pleural effusion in other conditions classified elsewhere: Secondary | ICD-10-CM | POA: Diagnosis not present

## 2023-02-10 DIAGNOSIS — F05 Delirium due to known physiological condition: Secondary | ICD-10-CM | POA: Diagnosis present

## 2023-02-10 DIAGNOSIS — K219 Gastro-esophageal reflux disease without esophagitis: Secondary | ICD-10-CM | POA: Diagnosis present

## 2023-02-10 DIAGNOSIS — T859XXA Unspecified complication of internal prosthetic device, implant and graft, initial encounter: Secondary | ICD-10-CM | POA: Diagnosis not present

## 2023-02-10 DIAGNOSIS — Z853 Personal history of malignant neoplasm of breast: Secondary | ICD-10-CM | POA: Diagnosis not present

## 2023-02-10 DIAGNOSIS — Z85820 Personal history of malignant melanoma of skin: Secondary | ICD-10-CM

## 2023-02-10 LAB — CBC WITH DIFFERENTIAL/PLATELET
Abs Immature Granulocytes: 0.07 10*3/uL (ref 0.00–0.07)
Basophils Absolute: 0.1 10*3/uL (ref 0.0–0.1)
Basophils Relative: 1 %
Eosinophils Absolute: 0 10*3/uL (ref 0.0–0.5)
Eosinophils Relative: 0 %
HCT: 41.3 % (ref 36.0–46.0)
Hemoglobin: 12.8 g/dL (ref 12.0–15.0)
Immature Granulocytes: 1 %
Lymphocytes Relative: 2 %
Lymphs Abs: 0.2 10*3/uL — ABNORMAL LOW (ref 0.7–4.0)
MCH: 26 pg (ref 26.0–34.0)
MCHC: 31 g/dL (ref 30.0–36.0)
MCV: 83.8 fL (ref 80.0–100.0)
Monocytes Absolute: 1.1 10*3/uL — ABNORMAL HIGH (ref 0.1–1.0)
Monocytes Relative: 12 %
Neutro Abs: 7.7 10*3/uL (ref 1.7–7.7)
Neutrophils Relative %: 84 %
Platelets: 258 10*3/uL (ref 150–400)
RBC: 4.93 MIL/uL (ref 3.87–5.11)
RDW: 17.1 % — ABNORMAL HIGH (ref 11.5–15.5)
WBC: 9.2 10*3/uL (ref 4.0–10.5)
nRBC: 0.3 % — ABNORMAL HIGH (ref 0.0–0.2)

## 2023-02-10 LAB — CREATININE, SERUM
Creatinine, Ser: 0.63 mg/dL (ref 0.44–1.00)
GFR, Estimated: >60 mL/min (ref >60)

## 2023-02-10 LAB — BLOOD GAS, VENOUS
Acid-Base Excess: 11 mmol/L — ABNORMAL HIGH (ref 0.0–2.0)
Bicarbonate: 36.4 mmol/L — ABNORMAL HIGH (ref 20.0–28.0)
O2 Saturation: 79.2 %
Patient temperature: 37
pCO2, Ven: 50 mmHg (ref 44–60)
pH, Ven: 7.47 — ABNORMAL HIGH (ref 7.25–7.43)
pO2, Ven: 43 mmHg (ref 32–45)

## 2023-02-10 LAB — CBC
HCT: 37 % (ref 36.0–46.0)
Hemoglobin: 11.6 g/dL — ABNORMAL LOW (ref 12.0–15.0)
MCH: 26.1 pg (ref 26.0–34.0)
MCHC: 31.4 g/dL (ref 30.0–36.0)
MCV: 83.3 fL (ref 80.0–100.0)
Platelets: 238 10*3/uL (ref 150–400)
RBC: 4.44 MIL/uL (ref 3.87–5.11)
RDW: 17.2 % — ABNORMAL HIGH (ref 11.5–15.5)
WBC: 9.6 10*3/uL (ref 4.0–10.5)
nRBC: 0 % (ref 0.0–0.2)

## 2023-02-10 LAB — LACTIC ACID, PLASMA
Lactic Acid, Venous: 1.3 mmol/L (ref 0.5–1.9)
Lactic Acid, Venous: 1 mmol/L (ref 0.5–1.9)

## 2023-02-10 LAB — BASIC METABOLIC PANEL
Anion gap: 10 (ref 5–15)
BUN: 35 mg/dL — ABNORMAL HIGH (ref 8–23)
CO2: 31 mmol/L (ref 22–32)
Calcium: 8.6 mg/dL — ABNORMAL LOW (ref 8.9–10.3)
Chloride: 96 mmol/L — ABNORMAL LOW (ref 98–111)
Creatinine, Ser: 0.67 mg/dL (ref 0.44–1.00)
GFR, Estimated: >60 mL/min (ref >60)
Glucose, Bld: 139 mg/dL — ABNORMAL HIGH (ref 70–99)
Potassium: 4.4 mmol/L (ref 3.5–5.1)
Sodium: 137 mmol/L (ref 135–145)

## 2023-02-10 LAB — BRAIN NATRIURETIC PEPTIDE: B Natriuretic Peptide: 205.1 pg/mL — ABNORMAL HIGH (ref 0.0–100.0)

## 2023-02-10 LAB — TROPONIN I (HIGH SENSITIVITY)
Troponin I (High Sensitivity): 16 ng/L (ref <18)
Troponin I (High Sensitivity): 14 ng/L (ref <18)

## 2023-02-10 LAB — SARS CORONAVIRUS 2 BY RT PCR: SARS Coronavirus 2 by RT PCR: NEGATIVE

## 2023-02-10 MED ORDER — SODIUM CHLORIDE 0.9 % IV SOLN: 500.0000 mg | INTRAVENOUS | Status: DC

## 2023-02-10 MED ORDER — LORATADINE 10 MG PO TABS
10.0000 mg | ORAL_TABLET | Freq: Every day | ORAL | Status: DC
  Administered 2023-02-11: 10 mg via ORAL
  Filled 2023-02-10 (×2): qty 1

## 2023-02-10 MED ORDER — LEVOTHYROXINE SODIUM 25 MCG PO TABS
125.0000 ug | ORAL_TABLET | Freq: Every day | ORAL | Status: DC
  Administered 2023-02-10 – 2023-02-12 (×2): 125 ug via ORAL
  Filled 2023-02-10 (×2): qty 1

## 2023-02-10 MED ORDER — LACTATED RINGERS IV BOLUS (SEPSIS)
500.0000 mL | Freq: Once | INTRAVENOUS | Status: AC
  Administered 2023-02-10: 500 mL via INTRAVENOUS

## 2023-02-10 MED ORDER — DOCUSATE SODIUM 100 MG PO CAPS
100.0000 mg | ORAL_CAPSULE | Freq: Two times a day (BID) | ORAL | Status: DC
  Filled 2023-02-10: qty 1

## 2023-02-10 MED ORDER — POLYETHYLENE GLYCOL 3350 17 G PO PACK: 17.0000 g | PACK | Freq: Every day | ORAL | Status: DC | PRN

## 2023-02-10 MED ORDER — SODIUM CHLORIDE 0.9 % IV SOLN
1.0000 g | Freq: Once | INTRAVENOUS | Status: AC
  Administered 2023-02-10: 1 g via INTRAVENOUS
  Filled 2023-02-10: qty 10

## 2023-02-10 MED ORDER — ACETAMINOPHEN 325 MG PO TABS: 650.0000 mg | ORAL_TABLET | Freq: Four times a day (QID) | ORAL | Status: DC | PRN

## 2023-02-10 MED ORDER — LACTATED RINGERS IV BOLUS
500.0000 mL | Freq: Once | INTRAVENOUS | Status: AC
  Administered 2023-02-10: 500 mL via INTRAVENOUS

## 2023-02-10 MED ORDER — SODIUM CHLORIDE 0.9 % IV SOLN
500.0000 mg | Freq: Once | INTRAVENOUS | Status: AC
  Administered 2023-02-10: 500 mg via INTRAVENOUS
  Filled 2023-02-10: qty 5

## 2023-02-10 MED ORDER — GUAIFENESIN-DM 100-10 MG/5ML PO SYRP: 5.0000 mL | ORAL_SOLUTION | Freq: Four times a day (QID) | ORAL | Status: DC | PRN

## 2023-02-10 MED ORDER — SUCRALFATE 1 G PO TABS
1.0000 g | ORAL_TABLET | Freq: Three times a day (TID) | ORAL | Status: DC
  Administered 2023-02-10 – 2023-02-11 (×4): 1 g via ORAL
  Filled 2023-02-10 (×4): qty 1

## 2023-02-10 MED ORDER — ALPRAZOLAM 0.25 MG PO TABS: 0.2500 mg | ORAL_TABLET | ORAL | Status: DC

## 2023-02-10 MED ORDER — SIMETHICONE 80 MG PO CHEW
80.0000 mg | CHEWABLE_TABLET | Freq: Three times a day (TID) | ORAL | Status: DC
  Administered 2023-02-11: 80 mg via ORAL
  Filled 2023-02-10 (×4): qty 1

## 2023-02-10 MED ORDER — METOPROLOL TARTRATE 5 MG/5ML IV SOLN: 5.0000 mg | Freq: Four times a day (QID) | INTRAVENOUS | Status: DC | PRN

## 2023-02-10 MED ORDER — SODIUM CHLORIDE 0.9 % IV SOLN
1.0000 g | INTRAVENOUS | Status: DC
  Administered 2023-02-11: 1 g via INTRAVENOUS
  Filled 2023-02-10 (×2): qty 10

## 2023-02-10 MED ORDER — ACETAMINOPHEN 650 MG RE SUPP: 650.0000 mg | Freq: Four times a day (QID) | RECTAL | Status: DC | PRN

## 2023-02-10 MED ORDER — OSMOLITE 1.5 CAL PO LIQD
237.0000 mL | Freq: Four times a day (QID) | ORAL | Status: DC
  Administered 2023-02-10 – 2023-02-11 (×3): 237 mL
  Filled 2023-02-10 (×5): qty 237

## 2023-02-10 MED ORDER — LEVALBUTEROL HCL 0.63 MG/3ML IN NEBU
0.6300 mg | INHALATION_SOLUTION | Freq: Four times a day (QID) | RESPIRATORY_TRACT | Status: DC | PRN
  Administered 2023-02-19: 0.63 mg via RESPIRATORY_TRACT
  Filled 2023-02-10: qty 3

## 2023-02-10 MED ORDER — ONDANSETRON HCL 4 MG PO TABS: 4.0000 mg | ORAL_TABLET | Freq: Four times a day (QID) | ORAL | Status: DC | PRN

## 2023-02-10 MED ORDER — METHYLPREDNISOLONE SODIUM SUCC 40 MG IJ SOLR
40.0000 mg | Freq: Once | INTRAMUSCULAR | Status: AC
  Administered 2023-02-11: 40 mg via INTRAVENOUS
  Filled 2023-02-10: qty 1

## 2023-02-10 MED ORDER — ENOXAPARIN SODIUM 30 MG/0.3ML IJ SOSY
30.0000 mg | PREFILLED_SYRINGE | INTRAMUSCULAR | Status: DC
  Administered 2023-02-10 – 2023-02-19 (×10): 30 mg via SUBCUTANEOUS
  Filled 2023-02-10 (×10): qty 0.3

## 2023-02-10 MED ORDER — ALPRAZOLAM 0.5 MG PO TABS
0.5000 mg | ORAL_TABLET | ORAL | Status: AC
  Administered 2023-02-10: 0.5 mg via ORAL
  Filled 2023-02-10: qty 1

## 2023-02-10 MED ORDER — HYDROCODONE-ACETAMINOPHEN 5-325 MG PO TABS: 1.0000 | ORAL_TABLET | Freq: Four times a day (QID) | ORAL | Status: DC | PRN

## 2023-02-10 MED ORDER — TRAZODONE HCL 50 MG PO TABS
25.0000 mg | ORAL_TABLET | Freq: Every evening | ORAL | Status: DC | PRN
  Administered 2023-02-10 – 2023-02-11 (×2): 25 mg via ORAL
  Filled 2023-02-10 (×2): qty 1

## 2023-02-10 MED ORDER — ONDANSETRON HCL 4 MG/2ML IJ SOLN: 4.0000 mg | Freq: Four times a day (QID) | INTRAMUSCULAR | Status: DC | PRN

## 2023-02-10 MED ORDER — PANTOPRAZOLE SODIUM 40 MG PO TBEC
40.0000 mg | DELAYED_RELEASE_TABLET | Freq: Two times a day (BID) | ORAL | Status: DC
  Administered 2023-02-11: 40 mg via ORAL
  Filled 2023-02-10 (×2): qty 1

## 2023-02-10 MED ORDER — ALBUTEROL SULFATE (2.5 MG/3ML) 0.083% IN NEBU: 2.5000 mg | INHALATION_SOLUTION | RESPIRATORY_TRACT | Status: DC | PRN

## 2023-02-10 NOTE — Progress Notes (Signed)
   02/10/23 1409  BiPAP/CPAP/SIPAP  $ Non-Invasive Ventilator  Non-Invasive Vent Set Up;Non-Invasive Vent Initial  $ Face Mask Medium Yes  BiPAP/CPAP/SIPAP Pt Type Adult  BiPAP/CPAP/SIPAP (S)  V60 (IPAP: 10, EPAP 5, RR 12, 50%.)  Mask Type Full face mask  Set Rate 12 breaths/min  Respiratory Rate 31 breaths/min  Flow Rate 1.1 lpm  Patient Home Equipment No  Auto Titrate No  Press High Alarm 30 cmH2O  Press Low Alarm 5 cmH2O  Nasal massage performed No (comment)  CPAP/SIPAP surface wiped down Yes  Oxygen Percent 50 %  BiPAP/CPAP /SiPAP Vitals  Pulse Rate (!) 129  Resp (!) 31  SpO2 93 %  Bilateral Breath Sounds Rales  MEWS Score/Color  MEWS Score 4  MEWS Score Color Red   BIPAP placed on pt per MD order.

## 2023-02-10 NOTE — H&P (Signed)
History and Physical  XCARET MORAD [id number] DOB: 09-24-1941 DOA: 02/10/2023  PCP: Richmond Campbell., PA-C   Chief Complaint: Cough, SOB   HPI: Shannon [name] is a 82 y.o. female with medical history significant for left-sided breast cancer, lung cancer, chronic diarrhea, tube feed dependent due to history of medullary thyroid carcinoma who presents to the ED with complaint of difficulty breathing and is being admitted for multifocal pneumonia.  Over the past week or so, she has had increased work of breathing, with worsening cough productive of green/yellow sputum.  Patient has had some subjective fevers as well as chills at home, but no measured fevers.  She does not wear oxygen at baseline.  She was seen in urgent care, sent to the emergency department due to abnormal chest x-ray.  Patient denies any abdominal pain no vomiting.  She does take some oral intake, but according to her daughter-in-law who is her primary caregiver, often goes several days at a time without any p.o. intake.  She does tube feeds 4 times a day.  ED Course: In the emergency department, she has been afebrile, heart rate 123, respirations 25, blood pressure into normal range.  She was placed on 5 L nasal cannula oxygen and still sat 90%.  She was placed on BiPAP for work of breathing, and is currently breathing comfortably on the BiPAP.  Review of Systems: Please see HPI for pertinent positives and negatives. A complete 10 system review of systems are otherwise negative.  Past Medical History:  Diagnosis Date   Anxiety    Breast cancer 1999   left lumpectomy/radiation/ductal carcinoma in situ   Cancer    Colon polyp    Contact lens/glasses fitting    HAS LENS IMPLANTS   Lung cancer 2015   LYMPHNODE REMOVAL    Medullary carcinoma of thyroid 2011   Osteoporosis    Past Surgical History:  Procedure Laterality Date   ABDOMINAL HYSTERECTOMY     ABDOMINAL SURGERY  1989   RUPTURED APPENDIX   BREAST SURGERY   1999   LEFT BREAST LUMPECTOMY. DUCTAL CARCINOMA IN SITU./ RADIATION   CATARACT EXTRACTION W/ INTRAOCULAR LENS IMPLANT  2009   BOTH EYES   FEMUR IM NAIL Left 10/23/2017   Procedure: INTRAMEDULLARY (IM) NAIL FEMORAL;  Surgeon: Durene Romans, MD;  Location[facility];  Service: Orthopedics;  Laterality: Left;   FOOT SURGERY  1070   RIGHT FOOT - PINCHED NERVE   IR GASTROSTOMY TUBE MOD SED  01/13/2021   IR REPLACE G-TUBE SIMPLE WO FLUORO  03/08/2022   IR REPLACE G-TUBE SIMPLE WO FLUORO  11/01/2022   IR [facility] GASTRO/COLONIC TUBE PERCUT W/FLUORO  05/17/2022   LUNG SURGERY     CALCIFICATIONS, POSITIVE - CANCER LYMPHNODE    MELANOMA EXCISION  2008   CHEST AREA   NECK SURGERY  Nov 09 2011   LYMPHNODES REMOVED 2 POSITIVE    RIGHT COLECTOMY  06/02/2009   THYROIDECTOMY  11/06/2009   TONSILLECTOMY AND ADENOIDECTOMY     Social History:  reports that she quit smoking about 62 years ago. Her smoking use included cigarettes. She has never used smokeless tobacco. She reports that she does not drink alcohol and does not use drugs.  Allergies  Allergen Reactions   Codeine Other (See Comments)    hyper Other reaction(s): insomnia   Family History  Problem Relation Age of Onset   Cancer Father        throat/ bone   Breast cancer Sister  Cancer Sister        MEDULAR CANCER   Cancer Brother        prostate   Cancer Other        MEDULAR CANCER   Breast cancer Maternal Aunt    Cancer Paternal Aunt        pancreatic    Cancer Cousin        ON MOTHER'S SIDE- LIVER CANCER    Prior to Admission medications   Medication Sig Start Date End Date Taking? Authorizing Provider  ALPRAZolam Prudy Feeler) 1 MG tablet Take 0.5 tablets (0.5 mg total) by mouth 2 (two) times daily. 10/27/17   Rhetta Mura, MD  bacitracin ophthalmic ointment  08/15/20   [provider]  Calcium Carb-Cholecalciferol 600-200 MG-UNIT TABS 2 tablets    [provider]  Calcium Carbonate-Vit D-Min (CALCIUM 600 + MINERALS  PO) Take 1 tablet by mouth daily.    [provider]  cetirizine (ZYRTEC) 10 MG tablet Take 10 mg by mouth daily.    [provider]  Cholecalciferol (VITAMIN D) 1000 UNITS capsule Take 1,000 Units by mouth daily.    [provider]  cholestyramine (QUESTRAN) 4 g packet DISSOLVE 1 PACKET IN LIQUID OF CHOICE AND DRINK 2 TIMES A DAY 04/09/21   Shamleffer, Konrad Dolores, MD  Cod Liver Oil 1000 MG CAPS Take 1,000 mg by mouth daily.    [provider]  diphenhydramine-acetaminophen (TYLENOL PM EXTRA STRENGTH) 25-500 MG TABS tablet 1-2 tabs    [provider]  Glucosamine-Chondroitin 250-200 MG CAPS Take 1 tablet by mouth daily. Name of table is SCHIFF 12/23/10   [provider]  HYDROcodone-acetaminophen (NORCO/VICODIN) 5-325 MG tablet Take 1 tablet by mouth every 6 (six) hours as needed. 01/16/23   Terrilee Files, MD  levothyroxine (SYNTHROID) 125 MCG tablet Take 1 tablet by mouth once daily 01/17/23   Shamleffer, Konrad Dolores, MD  loperamide (IMODIUM A-D) 2 MG tablet Take 2 mg by mouth 3 (three) times daily.    [provider]  Multiple Vitamin (MULTIVITAMIN) tablet Take 1 tablet by mouth daily.    [provider]  ondansetron (ZOFRAN) 8 MG tablet Take 1 tablet by mouth every 8 hours as needed for nausea or vomiting. 01/06/23   Johney Maine, MD  pantoprazole (PROTONIX) 40 MG tablet TAKE 1 TABLET BY MOUTH TWICE DAILY BEFORE A MEAL 03/31/22   Johney Maine, MD  simethicone [facility]) 80 MG chewable tablet Chew 80 mg by mouth 3 (three) times daily.    [provider]  sucralfate (CARAFATE) 1 g tablet TAKE 1 TABLET BY MOUTH 4 TIMES DAILY WITH MEALS AND AT BEDTIME 11/14/22   Johney Maine, MD  vitamin E 180 MG (400 UNITS) capsule 1 tablet    [provider]  zoledronic acid (RECLAST) 5 MG/100ML SOLN injection Inject 5 mg into the vein once.    [provider]   Physical Exam: BP 126/77  (BP Location: Right Arm)   Pulse (!) 112   Temp 98.3 F (36.8 C) (Oral)   Resp (!) 23   Ht  (1.6 m)   Wt 43.1 kg   SpO2 96%   BMI 16.83 kg/m   General:  Thin, frail female resting comfortably on Bipap with daughter in law at the bedside Eyes: EOMI, clear conjuctivae, white sclerea Neck: supple, no masses, trachea mildline  Cardiovascular: RRR, no murmurs or rubs, no peripheral edema  Respiratory: distant breath sounds, good bilateral air  entry with reduced breath sounds at the right base. No active wheezing or rhonchi. Abdomen: soft, nontender, nondistended, normal bowel tones heard  Skin: dry, no rashes  Musculoskeletal: no joint effusions, normal range of motion  Psychiatric: appropriate affect, normal speech  Neurologic: extraocular muscles intact, clear speech, moving all extremities with intact sensorium          Labs on Admission:  Basic Metabolic Panel: Recent Labs  Lab 02/10/23 1317  NA 137  K 4.4  CL 96*  CO2 31  GLUCOSE 139*  BUN 35*  CREATININE 0.67  CALCIUM 8.6*   Liver Function Tests: No results for input(s): "AST", "ALT", "ALKPHOS", "BILITOT", "PROT", "ALBUMIN" in the last 168 hours. No results for input(s): "LIPASE", "AMYLASE" in the last 168 hours. No results for input(s): "AMMONIA" in the last 168 hours. CBC: Recent Labs  Lab 02/10/23 1317  WBC 9.2  NEUTROABS 7.7  HGB 12.8  HCT 41.3  MCV 83.8  PLT 258   Cardiac Enzymes: No results for input(s): "CKTOTAL", "CKMB", "CKMBINDEX", "TROPONINI" in the last 168 hours.  BNP (last 3 results) Recent Labs    02/10/23 1317  BNP 205.1*   ProBNP (last 3 results) No results for input(s): "PROBNP" in the last 8760 hours.  CBG: No results for input(s): "GLUCAP" in the last 168 hours.  Radiological Exams on Admission: DG Chest Port 1 View  Result Date: 02/10/2023 CLINICAL DATA:  Shortness of breath EXAM: PORTABLE CHEST 1 VIEW COMPARISON:  Radiograph 06/17/2022 FINDINGS: Unchanged  cardiomediastinal silhouette. Mild interstitial opacities. There is a small to moderate size right pleural effusion with adjacent basilar opacities. Additional patchy left basilar and left midlung opacities. Unchanged right apical calcification and associated lung opacity. No evidence of pneumothorax. Diffuse osseous metastatic lesions, best visualized on prior CTs. IMPRESSION: Small to moderate size right pleural effusion. Adjacent right lower lung opacities and additional patchy left basilar and left mid lung opacities, could reflect atelectasis and/or pneumonia. Interstitial prominence could reflect a degree of pulmonary edema. Electronically Signed   By: Caprice Renshaw M.D.   On: 02/10/2023 13:59    Assessment/Plan Principal Problem:   Multifocal pneumonia-she is technically meeting sepsis criteria with tachycardia and tachypnea, with pneumonia as a source.  She is hemodynamically stable, with normal lactate.  She has an associated moderate right sided pleural effusion.  Placed on BiPAP due to work of breathing, and currently breathing comfortably. -Inpatient admission to progressive unit -Continue BiPAP support for now, can wean to nasal cannula as tolerated -Empiric IV azithromycin and Rocephin -Will plan on right-sided ultrasound guided thoracentesis, appropriate labs ordered  Active Problems:   History of breast cancer and lung cancer -currently on surveillance, was unable to tolerate oral chemotherapy and has repeat CT/PET scan scheduled   Hypothyroidism-continue home Synthroid 125 mcg/day   Thyroid cancer, medullary carcinoma   Chronic diarrhea-as needed loperamide   Parapneumonic effusion-thoracentesis as above Severe dysphagia-feeding tube dependent, Osmolite ordered 4 times daily  DVT prophylaxis: Lovenox     Code Status: Full Code  Consults called: None  Admission status: The appropriate patient status for this patient is INPATIENT. Inpatient status is judged to be reasonable and  necessary in order to provide the required intensity of service to ensure the patient's safety. The patient's presenting symptoms, physical exam findings, and initial radiographic and laboratory data in the context of their chronic comorbidities is felt to place them at high risk for further clinical deterioration. Furthermore, it is not anticipated that the patient will be  medically stable for discharge from the hospital within 2 midnights of admission.    I certify that at the point of admission it is my clinical judgment that the patient will require inpatient hospital care spanning beyond 2 midnights from the point of admission due to high intensity of service, high risk for further deterioration and high frequency of surveillance required  Time spent: 48 minutes  Nahla Lukin Sharlette Dense MD [facility] Pager [phone]  If 7PM-7AM, please contact night-coverage [email or web address] Password [facility]  02/10/2023, 6:21 PM

## 2023-02-10 NOTE — Progress Notes (Signed)
   02/10/23 1902  Assess: MEWS Score  Temp 98.2 F (36.8 C)  BP 137/77  MAP (mmHg) 94  Pulse Rate (!) 116  Resp (!) 26  SpO2 90 %  Assess: MEWS Score  MEWS Temp 0  MEWS Systolic 0  MEWS Pulse 2  MEWS RR 2  MEWS LOC 0  MEWS Score 4  MEWS Score Color Red  Assess: if the MEWS score is Yellow or Red  Were vital signs taken at a resting state? Yes  Focused Assessment Change from prior assessment (see assessment flowsheet)  Does the patient meet 2 or more of the SIRS criteria? Yes  Does the patient have a confirmed or suspected source of infection? Yes  MEWS guidelines implemented  Yes, red  Treat  MEWS Interventions Considered administering scheduled or prn medications/treatments as ordered  Take Vital Signs  Increase Vital Sign Frequency  Red: Q1hr x2, continue Q4hrs until patient remains green for 12hrs  Escalate  MEWS: Escalate Red: Discuss with charge nurse and notify provider. Consider notifying RRT. If remains red for 2 hours consider need for higher level of care  Notify: Charge Nurse/RN  Name of Charge Nurse/RN Notified Engineer, drilling  Provider Notification  Provider Name/Title A. Virgel Manifold NP  Date Provider Notified 02/10/23  Time Provider Notified 1919  Method of Notification Page  Notification Reason Other (Comment) (MEWS score)  Provider response No new orders  Date of Provider Response 02/10/23  Time of Provider Response 0720  Assess: SIRS CRITERIA  SIRS Temperature  0  SIRS Pulse 1  SIRS Respirations  1  SIRS WBC 0  SIRS Score Sum  2   Reported to on-coming nurse: Myrtie Soman RN.  Joscelin Fray, Yancey Flemings, RN

## 2023-02-10 NOTE — ED Provider Notes (Signed)
[place] EMERGENCY DEPARTMENT AT [facility] Provider Note  CSN: [id number] Arrival date & time: 02/10/23 1309  Chief Complaint(s) Shortness of Breath  HPI Shannon [name] is a 82 y.o. female with past medical history as below, significant for left-sided breast cancer, lung cancer, chronic diarrhea, thyroid cancer, medullary carcinoma, feeding tube dependent who presents to the ED with complaint of difficulty breathing.  Patient with increased work of breathing over the past week, worsening cough productive with green and yellow sputum.  No fevers.  No home oxygen use.  Was seen in urgent care and had abnormal x-ray and sent to the ER for further evaluation.  Compliant with home medications.  She has chest tightness but no chest pain.  No abdominal pain, no vomiting.  No change in urination.  No palpitations.  No recent travel or sick contacts.  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   Medullary carcinoma 07/15/2019   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 Authorizing Provider  ALPRAZolam Prudy Feeler) 1 MG tablet Take 0.5 tablets (0.5 mg total) by mouth 2 (two) times daily. 10/27/17   Rhetta Mura, MD  bacitracin ophthalmic ointment  08/15/20   [provider]  Calcium Carb-Cholecalciferol 600-200 MG-UNIT TABS 2 tablets    [provider]  Calcium Carbonate-Vit D-Min (CALCIUM 600 + MINERALS PO) Take 1 tablet by mouth daily.    [provider]  cetirizine (ZYRTEC) 10 MG tablet Take 10 mg by mouth daily.    [provider]  Cholecalciferol (VITAMIN D) 1000 UNITS capsule Take 1,000 Units by mouth daily.    [provider]  cholestyramine (QUESTRAN) 4 g packet DISSOLVE 1 PACKET IN LIQUID OF CHOICE AND DRINK 2 TIMES A DAY 04/09/21   Shamleffer, Konrad Dolores, MD  Cod Liver Oil 1000 MG CAPS Take 1,000 mg by mouth daily.    [provider]  diphenhydramine-acetaminophen (TYLENOL PM EXTRA STRENGTH) 25-500 MG TABS tablet 1-2 tabs    [provider]  Glucosamine-Chondroitin 250-200 MG CAPS Take 1 tablet by mouth daily. Name of table is SCHIFF 12/23/10   [provider]  HYDROcodone-acetaminophen (NORCO/VICODIN) 5-325 MG tablet Take 1 tablet by mouth every 6 (six) hours as needed. 01/16/23   Terrilee Files, MD  levothyroxine (SYNTHROID) 125 MCG tablet Take 1 tablet by mouth once daily 01/17/23  Shamleffer, Konrad Dolores, MD  loperamide (IMODIUM A-D) 2 MG tablet Take 2 mg by mouth 3 (three) times daily.    [provider]  Multiple Vitamin (MULTIVITAMIN) tablet Take 1 tablet by mouth daily.    [provider]  ondansetron (ZOFRAN) 8 MG tablet Take 1 tablet by mouth every 8 hours as needed for nausea or vomiting. 01/06/23   Johney Maine,  MD  pantoprazole (PROTONIX) 40 MG tablet TAKE 1 TABLET BY MOUTH TWICE DAILY BEFORE A MEAL 03/31/22   Johney Maine, MD  simethicone [facility]) 80 MG chewable tablet Chew 80 mg by mouth 3 (three) times daily.    [provider]  sucralfate (CARAFATE) 1 g tablet TAKE 1 TABLET BY MOUTH 4 TIMES DAILY WITH MEALS AND AT BEDTIME 11/14/22   Johney Maine, MD  vitamin E 180 MG (400 UNITS) capsule 1 tablet    [provider]  zoledronic acid (RECLAST) 5 MG/100ML SOLN injection Inject 5 mg into the vein once.    [provider]                                                                                                                                    Past Surgical History Past Surgical History:  Procedure Laterality Date   ABDOMINAL HYSTERECTOMY     ABDOMINAL SURGERY  1989   RUPTURED APPENDIX   BREAST SURGERY  1999   LEFT BREAST LUMPECTOMY. DUCTAL CARCINOMA IN SITU./ RADIATION   CATARACT EXTRACTION W/ INTRAOCULAR LENS IMPLANT  2009   BOTH EYES   FEMUR IM NAIL Left 10/23/2017   Procedure: INTRAMEDULLARY (IM) NAIL FEMORAL;  Surgeon: Durene Romans, MD;  Location: [facility] OR;  Service: Orthopedics;  Laterality: Left;   FOOT SURGERY  1070   RIGHT FOOT - PINCHED NERVE   IR GASTROSTOMY TUBE MOD SED  01/13/2021   IR REPLACE G-TUBE SIMPLE WO FLUORO  03/08/2022   IR REPLACE G-TUBE SIMPLE WO FLUORO  11/01/2022   IR REPLC GASTRO/COLONIC TUBE PERCUT W/FLUORO  05/17/2022   LUNG SURGERY     CALCIFICATIONS, POSITIVE - CANCER LYMPHNODE    MELANOMA EXCISION  2008   CHEST AREA   NECK SURGERY  Nov 09 2011   LYMPHNODES REMOVED 2 POSITIVE    RIGHT COLECTOMY  06/02/2009   THYROIDECTOMY  11/06/2009   TONSILLECTOMY AND ADENOIDECTOMY     Family History Family History  Problem Relation Age of Onset   Cancer Father        throat/ bone   Breast cancer Sister    Cancer Sister        MEDULAR CANCER   Cancer Brother        prostate   Cancer Other        MEDULAR CANCER   Breast cancer  Maternal Aunt    Cancer Paternal Aunt        pancreatic    Cancer Cousin  ON MOTHER'S SIDE- LIVER CANCER    Social History Social History   Tobacco Use   Smoking status: Former    Types: Cigarettes    Quit date: 08/10/1960    Years since quitting: 62.5   Smokeless tobacco: Never  Vaping Use   Vaping Use: Never used  Substance Use Topics   Alcohol use: No    Alcohol/week: 0.0 standard drinks of alcohol   Drug use: No   Allergies Codeine  Review of Systems Review of Systems  Constitutional:  Positive for fatigue. Negative for activity change and fever.  HENT:  Negative for facial swelling and trouble swallowing.   Eyes:  Negative for discharge and redness.  Respiratory:  Positive for cough, chest tightness and shortness of breath.   Cardiovascular:  Negative for chest pain and palpitations.  Gastrointestinal:  Positive for diarrhea. Negative for abdominal pain and nausea.  Genitourinary:  Negative for dysuria and flank pain.  Musculoskeletal:  Negative for back pain and gait problem.  Skin:  Negative for pallor and rash.  Neurological:  Negative for syncope and headaches.    Physical Exam Vital Signs  I have reviewed the triage vital signs BP 129/78 (BP Location: Left Arm)   Pulse (!) 113   Temp 98.2 F (36.8 C) (Oral)   Resp (!) 23   Ht 5\' 3"  (1.6 m)   Wt 43.1 kg   SpO2 96%   BMI 16.83 kg/m  Physical Exam Vitals and nursing note reviewed.  Constitutional:      General: She is not in acute distress.    Appearance: Normal appearance. She is well-developed.  HENT:     Head: Normocephalic and atraumatic.     Right Ear: External ear normal.     Left Ear: External ear normal.     Nose: Nose normal.     Mouth/Throat:     Mouth: Mucous membranes are moist.  Eyes:     General: No scleral icterus.       Right eye: No discharge.        Left eye: No discharge.  Cardiovascular:     Rate and Rhythm: Regular rhythm. Tachycardia present.     Pulses: Normal  pulses.     Heart sounds: Normal heart sounds.  Pulmonary:     Effort: Pulmonary effort is normal. Tachypnea present. No respiratory distress.     Breath sounds: Examination of the right-middle field reveals decreased breath sounds. Examination of the right-lower field reveals decreased breath sounds. Decreased breath sounds present.  Abdominal:     General: Abdomen is flat.     Tenderness: There is no abdominal tenderness.    Musculoskeletal:     Right lower leg: No edema.     Left lower leg: No edema.  Skin:    General: Skin is warm and dry.     Capillary Refill: Capillary refill takes less than 2 seconds.  Neurological:     Mental Status: She is alert and oriented to person, place, and time.     GCS: GCS eye subscore is 4. GCS verbal subscore is 5. GCS motor subscore is 6.  Psychiatric:        Mood and Affect: Mood normal.        Behavior: Behavior normal.     ED Results and Treatments Labs (all labs ordered are listed, but only abnormal results are displayed) Labs Reviewed  BASIC METABOLIC PANEL - Abnormal; Notable for the following components:      Result Value  Chloride 96 (*)    Glucose, Bld 139 (*)    BUN 35 (*)    Calcium 8.6 (*)    All other components within normal limits  BRAIN NATRIURETIC PEPTIDE - Abnormal; Notable for the following components:   B Natriuretic Peptide 205.1 (*)    All other components within normal limits  CBC WITH DIFFERENTIAL/PLATELET - Abnormal; Notable for the following components:   RDW 17.1 (*)    nRBC 0.3 (*)    Lymphs Abs 0.2 (*)    Monocytes Absolute 1.1 (*)    All other components within normal limits  BLOOD GAS, VENOUS - Abnormal; Notable for the following components:   pH, Ven 7.47 (*)    Bicarbonate 36.4 (*)    Acid-Base Excess 11.0 (*)    All other components within normal limits  SARS CORONAVIRUS 2 BY RT PCR  RESP PANEL BY RT-PCR (RSV, FLU A&B, COVID)  [id number]  CULTURE, BLOOD (ROUTINE X 2)  CULTURE, BLOOD (ROUTINE X 2)   LACTIC ACID, PLASMA  LACTIC ACID, PLASMA  TROPONIN I (HIGH SENSITIVITY)  TROPONIN I (HIGH SENSITIVITY)                                                                                                                          Radiology DG Chest Port 1 View  Result Date: 02/10/2023 CLINICAL DATA:  Shortness of breath EXAM: PORTABLE CHEST 1 VIEW COMPARISON:  Radiograph 06/17/2022 FINDINGS: Unchanged cardiomediastinal silhouette. Mild interstitial opacities. There is a small to moderate size right pleural effusion with adjacent basilar opacities. Additional patchy left basilar and left midlung opacities. Unchanged right apical calcification and associated lung opacity. No evidence of pneumothorax. Diffuse osseous metastatic lesions, best visualized on prior CTs. IMPRESSION: Small to moderate size right pleural effusion. Adjacent right lower lung opacities and additional patchy left basilar and left mid lung opacities, could reflect atelectasis and/or pneumonia. Interstitial prominence could reflect a degree of pulmonary edema. Electronically Signed   By: Caprice Renshaw M.D.   On: 02/10/2023 13:59    Pertinent labs & imaging results that were available during my care of the patient were reviewed by me and considered in my medical decision making (see MDM for details).  Medications Ordered in ED Medications  lactated ringers bolus 500 mL (has no administration in time range)  cefTRIAXone (ROCEPHIN) 1 g in sodium chloride 0.9 % 100 mL IVPB (0 g Intravenous Stopped 02/10/23 1456)  azithromycin (ZITHROMAX) 500 mg in sodium chloride 0.9 % 250 mL IVPB (0 mg Intravenous Stopped 02/10/23 1605)  lactated ringers bolus 500 mL (0 mLs Intravenous Stopped 02/10/23 1640)  Procedures .Critical Care  Performed by: Sloan Leiter, DO Authorized by: Sloan Leiter, DO   Critical care  provider statement:    Critical care time (minutes):  45   Critical care time was exclusive of:  Separately billable procedures and treating other patients   Critical care was necessary to treat or prevent imminent or life-threatening deterioration of the following conditions:  Respiratory failure   Critical care was time spent personally by me on the following activities:  Development of treatment plan with patient or surrogate, discussions with consultants, evaluation of patient's response to treatment, examination of patient, ordering and review of laboratory studies, ordering and review of radiographic studies, ordering and performing treatments and interventions, pulse oximetry, re-evaluation of patient's condition, review of old charts and obtaining history from patient or surrogate   Care discussed with: admitting provider     (including critical care time)  Medical Decision Making / ED Course    Medical Decision Making:    AHRIYAH HARTKOPF is a 82 y.o. female with past medical history as below, significant for left-sided breast cancer, lung cancer, chronic diarrhea, thyroid cancer, medullary carcinoma, feeding tube dependent who presents to the ED with complaint of difficulty breathing. The complaint involves an extensive differential diagnosis and also carries with it a high risk of complications and morbidity.  Serious etiology was considered. Ddx includes but is not limited to: In my evaluation of this patient's dyspnea my DDx includes, but is not limited to, pneumonia, pulmonary embolism, pneumothorax, pulmonary edema, metabolic acidosis, asthma, COPD, cardiac cause, anemia, anxiety, etc.    Complete initial physical exam performed, notably the patient  was patient tachypneic, conversational dyspnea on room air, hypoxic.  Placed on 5 L nasal cannula with improvement to 92%.  Continues to have conversational dyspnea, will trial BiPAP.Marland Kitchen    Reviewed and confirmed nursing documentation for  past medical history, family history, social history.  Vital signs reviewed.    Clinical Course as of 02/10/23 1715  Fri Feb 10, 2023  1356 Pt pulse ox 89-90% on 5L [place], she has no home oxygen usage. Pleural effusion noted on wet read of CXR on right > L. She has conversational dyspnea. Will start bipap given increased WOB  [SG]  1410 pna on imaging w/ pleural eff, respiratory distress, start bipap, code sepsis  [SG]  1714 Lactic Acid, Venous: 1.0 [SG]  1714 Pulse Rate(!): 113 Tachycardia improving, give ivf [SG]  1714 SARS Coronavirus 2 by RT PCR: NEGATIVE [SG]    Clinical Course User Index [SG] Tanda Rockers A, DO    Pt found to have PNA, pleural effusion, requiring BIPAP. PSI score is elevated. Started broad spectrum abx, cultures sent, sepsis bundle started. Does not appear to be in septic shock. Recommend admission, pt agreeable, Accepted TRH   Additional history obtained: -Additional history obtained from family -External records from outside source obtained and reviewed including: Chart review including previous notes, labs, imaging, consultation notes including prior ED visits, primary care documentation, prior labs and imaging   Lab Tests: -I ordered, reviewed, and interpreted labs.   The pertinent results include:   Labs Reviewed  BASIC METABOLIC PANEL - Abnormal; Notable for the following components:      Result Value   Chloride 96 (*)    Glucose, Bld 139 (*)    BUN 35 (*)    Calcium 8.6 (*)    All other components within normal limits  BRAIN NATRIURETIC PEPTIDE - Abnormal; Notable for the following  components:   B Natriuretic Peptide 205.1 (*)    All other components within normal limits  CBC WITH DIFFERENTIAL/PLATELET - Abnormal; Notable for the following components:   RDW 17.1 (*)    nRBC 0.3 (*)    Lymphs Abs 0.2 (*)    Monocytes Absolute 1.1 (*)    All other components within normal limits  BLOOD GAS, VENOUS - Abnormal; Notable for the following components:    pH, Ven 7.47 (*)    Bicarbonate 36.4 (*)    Acid-Base Excess 11.0 (*)    All other components within normal limits  SARS CORONAVIRUS 2 BY RT PCR  RESP PANEL BY RT-PCR (RSV, FLU A&B, COVID)  [id number]  CULTURE, BLOOD (ROUTINE X 2)  CULTURE, BLOOD (ROUTINE X 2)  LACTIC ACID, PLASMA  LACTIC ACID, PLASMA  TROPONIN I (HIGH SENSITIVITY)  TROPONIN I (HIGH SENSITIVITY)    Notable for as above  EKG   EKG Interpretation  Date/Time:    Ventricular Rate:    PR Interval:    QRS Duration:   QT Interval:    QTC Calculation:   R Axis:     Text Interpretation:           Imaging Studies ordered: I ordered imaging studies including CXR I independently visualized the following imaging with scope of interpretation limited to determining acute life threatening conditions related to emergency care; findings noted above, significant for pna, pleural eff I independently visualized and interpreted imaging. I agree with the radiologist interpretation   Medicines ordered and prescription drug management: Meds ordered this encounter  Medications   cefTRIAXone (ROCEPHIN) 1 g in sodium chloride 0.9 % 100 mL IVPB    Order Specific Question:   Antibiotic Indication:    Answer:   CAP   azithromycin (ZITHROMAX) 500 mg in sodium chloride 0.9 % 250 mL IVPB    Order Specific Question:   Antibiotic Indication:    Answer:   CAP   lactated ringers bolus 500 mL    Order Specific Question:   Reason 30 mL/kg dose is not being ordered    Answer:   First Lactic Acid Pending   lactated ringers bolus 500 mL    -I have reviewed the patients home medicines and have made adjustments as needed   Consultations Obtained: na   Cardiac Monitoring: The patient was maintained on a cardiac monitor.  I personally viewed and interpreted the cardiac monitored which showed an underlying rhythm of: sinus tachy  Social Determinants of Health:  Diagnosis or treatment significantly limited by social determinants of  health: former smoker    Reevaluation: After the interventions noted above, I reevaluated the patient and found that they have improved  Co morbidities that complicate the patient evaluation  Past Medical History:  Diagnosis Date   Anxiety    Breast cancer 1999   left lumpectomy/radiation/ductal carcinoma in situ   Cancer    Colon polyp    Contact lens/glasses fitting    HAS LENS IMPLANTS   Lung cancer 2015   LYMPHNODE REMOVAL    Medullary carcinoma of thyroid 2011   Osteoporosis       Dispostion: Disposition decision including need for hospitalization was considered, and patient admitted to the hospital.    Final Clinical Impression(s) / ED Diagnoses Final diagnoses:  Respiratory distress  Pleural effusion  Pneumonia due to infectious organism, unspecified laterality, unspecified part of lung     This chart was dictated using voice recognition software.  Despite best efforts  to proofread,  errors can occur which can change the documentation meaning.    Tanda Rockers A, DO 02/10/23 1715

## 2023-02-10 NOTE — ED Notes (Signed)
ED TO INPATIENT HANDOFF REPORT  ED Nurse Name and Phone #: Crist Infante, RN [phone]  S Name/Age/Gender Shannon [name] 82 y.o. female Room/Bed: WA17/WA17  Code Status   Code Status: Full Code  Home/SNF/Other Home Patient oriented to: self, place, and time Is this baseline? Yes   Triage Complete: Triage complete  Chief Complaint Multifocal pneumonia [J18.9]  Triage Note Patient BIB [facility] from home. Seen at urgent care earlier today, her chest x ray says pleural effusion. Has been feeling short of breath for 3 days along with productive cough with green sputum. Has a feeding tube. Has thyroid cancer.  EMS 86% room air, placed on 4L [place] and is 94%% 115/64 115 HR LR 20G left forearm    Allergies Allergies  Allergen Reactions   Codeine Other (See Comments)    hyper Other reaction(s): insomnia    Level of Care/Admitting Diagnosis ED Disposition     ED Disposition  Admit   Condition  --   Comment  Hospital Area: [facility] [[id number]]  Level of Care: Progressive [102]  Admit to Progressive based on following criteria: RESPIRATORY PROBLEMS hypoxemic/hypercapnic respiratory failure that is responsive to NIPPV (BiPAP) or High Flow Nasal Cannula (6-80 lpm). Frequent assessment/intervention, no > Q2 hrs < Q4 hrs, to maintain oxygenation and pulmonary hygiene.  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 DUCTAL CARCINOMA IN SITU./ RADIATION   CATARACT EXTRACTION W/ INTRAOCULAR LENS IMPLANT  2009   BOTH EYES   FEMUR IM NAIL Left 10/23/2017   Procedure: INTRAMEDULLARY (IM) NAIL FEMORAL;  Surgeon: Durene Romans, MD;  Location: [facility] OR;  Service: Orthopedics;  Laterality: Left;   FOOT SURGERY  1070   RIGHT FOOT - PINCHED NERVE   IR GASTROSTOMY TUBE MOD SED  01/13/2021   IR REPLACE G-TUBE SIMPLE WO FLUORO  03/08/2022   IR REPLACE G-TUBE SIMPLE WO FLUORO  11/01/2022   IR REPLC GASTRO/COLONIC TUBE PERCUT W/FLUORO  05/17/2022   LUNG SURGERY     CALCIFICATIONS, POSITIVE - CANCER LYMPHNODE    MELANOMA EXCISION  2008   CHEST AREA   NECK SURGERY  Nov 09 2011   LYMPHNODES REMOVED 2 POSITIVE    RIGHT COLECTOMY  06/02/2009   THYROIDECTOMY  11/06/2009   TONSILLECTOMY AND ADENOIDECTOMY       A IV Location/Drains/Wounds Patient Lines/Drains/Airways Status     Active Line/Drains/Airways     Name Placement date Placement time Site Days   Peripheral IV 02/10/23 20 G Right Antecubital 02/10/23  1317  Antecubital  less than 1   Peripheral IV 02/10/23 20 G Left Antecubital 02/10/23  1318  Antecubital  less than 1   Peripheral IV 02/10/23 20 G Anterior;Left Forearm 02/10/23  1320  Forearm  less than 1   Gastrostomy/Enterostomy Gastrostomy 18 Fr. LLQ 11/01/22  1217  LLQ  101   External Urinary Catheter 10/23/17  1632  --  1936            Intake/Output Last 24 hours  Intake/Output Summary (Last 24 hours) at 02/10/2023 1810 Last data filed at 02/10/2023 1800 Gross per 24 hour  Intake 1406.49 ml  Output --  Net 1406.49 ml    Labs/Imaging Results for orders placed or performed  during the hospital encounter of 02/10/23 (from the past 48 hour(s))  Basic metabolic panel     Status: Abnormal   Collection Time: 02/10/23  1:17 PM  Result Value Ref Range   Sodium 137 135 - 145 mmol/L   Potassium 4.4 3.5 - 5.1 mmol/L   Chloride 96 (L) 98 - 111 mmol/L   CO2 31 22 - 32 mmol/L   Glucose, Bld 139 (H) 70 - 99 mg/dL    Comment: Glucose reference range applies only to samples taken after fasting for at least 8 hours.   BUN 35 (H) 8 - 23 mg/dL   Creatinine, Ser 7.82 0.44 - 1.00 mg/dL   Calcium 8.6 (L) 8.9 - 10.3 mg/dL   GFR, Estimated >95 >62 mL/min    Comment: (NOTE) Calculated using the CKD-EPI Creatinine Equation (2021)    Anion gap 10 5 - 15    Comment: Performed at [facility], [address], [place], [place] [address]  Brain natriuretic peptide     Status: Abnormal   Collection Time: 02/10/23  1:17 PM  Result Value Ref Range   B Natriuretic Peptide 205.1 (H) 0.0 - 100.0 pg/mL    Comment: Performed at [facility], [address], [place], [place] [address]  CBC with Differential     Status: Abnormal   Collection Time: 02/10/23  1:17 PM  Result Value Ref Range   WBC 9.2 4.0 - 10.5 K/uL   RBC 4.93 3.87 - 5.11 MIL/uL   Hemoglobin 12.8 12.0 - 15.0 g/dL   HCT 96.2 95.2 - 84.1 %   MCV 83.8 80.0 - 100.0 fL   MCH 26.0 26.0 - 34.0 pg   MCHC 31.0 30.0 - 36.0 g/dL   RDW 32.4 (H) 40.1 - 02.7 %   Platelets 258 150 - 400 K/uL   nRBC 0.3 (H) 0.0 - 0.2 %   Neutrophils Relative % 84 %   Neutro Abs 7.7 1.7 - 7.7 K/uL   Lymphocytes Relative 2 %   Lymphs Abs 0.2 (L) 0.7 - 4.0 K/uL   Monocytes Relative 12 %   Monocytes Absolute 1.1 (H) 0.1 - 1.0 K/uL   Eosinophils Relative 0 %   Eosinophils Absolute 0.0 0.0 - 0.5 K/uL   Basophils Relative 1 %   Basophils Absolute 0.1 0.0 - 0.1 K/uL   Immature Granulocytes 1 %   Abs Immature Granulocytes 0.07 0.00 - 0.07 K/uL    Comment: Performed at [facility], [address] [place], [place] [address]  Troponin I (High Sensitivity)     Status: None   Collection Time: 02/10/23  1:17 PM  Result Value Ref Range   Troponin I (High Sensitivity) 16 <18 ng/L    Comment: (NOTE) Elevated high sensitivity troponin I (  hsTnI) values and significant  changes across serial measurements may suggest ACS but many other  chronic and acute conditions are known to elevate hsTnI results.  Refer to the "Links" section for chest pain algorithms and additional  guidance. Performed at [facility], [address] [place], [place] [address]   SARS Coronavirus 2 by RT PCR (hospital order, performed in [facility] hospital lab) *cepheid single result test* Anterior Nasal Swab     Status: None   Collection Time: 02/10/23  1:17 PM   Specimen: Anterior Nasal Swab  Result Value Ref Range   SARS Coronavirus 2 by RT PCR NEGATIVE NEGATIVE    Comment: (NOTE) SARS-CoV-2 target nucleic acids are NOT DETECTED.  The SARS-CoV-2 RNA is generally detectable in upper and lower respiratory specimens during the acute phase of infection. The lowest concentration of SARS-CoV-2 viral copies this assay can detect is 250 copies / mL. A negative result does not preclude SARS-CoV-2 infection and should not be used as the sole basis for treatment or other patient management decisions.  A negative result may occur with improper specimen collection / handling, submission of specimen other than nasopharyngeal swab, presence of viral mutation(s) within the areas targeted by this assay, and inadequate number of viral copies (<250 copies / mL). A negative result must be combined with clinical observations, patient history, and epidemiological information.  Fact Sheet for Patients:   RoadLapTop.co.za  Fact Sheet for Healthcare Providers: http://kim-miller.com/  This test is not yet approved or  cleared by the [place] FDA and has been  authorized for detection and/or diagnosis of SARS-CoV-2 by FDA under an Emergency Use Authorization (EUA).  This EUA will remain in effect (meaning this test can be used) for the duration of the COVID-19 declaration under Section 564(b)(1) of the Act, 21 U.S.C. section 360bbb-3(b)(1), unless the authorization is terminated or revoked sooner.  Performed at [facility], [address] [place], [place] [address]   Lactic acid, plasma     Status: None   Collection Time: 02/10/23  1:17 PM  Result Value Ref Range   Lactic Acid, Venous 1.3 0.5 - 1.9 mmol/L    Comment: Performed at [facility], [address], [place], [place] [address]  Blood gas, venous (at [facility])     Status: Abnormal   Collection Time: 02/10/23  1:30 PM  Result Value Ref Range   pH, Ven 7.47 (H) 7.25 - 7.43   pCO2, Ven 50 44 - 60 mmHg   pO2, Ven 43 32 - 45 mmHg   Bicarbonate 36.4 (H) 20.0 - 28.0 mmol/L   Acid-Base Excess 11.0 (H) 0.0 - 2.0 mmol/L   O2 Saturation 79.2 %   Patient temperature 37.0     Comment: Performed at [facility], [address] [place], [place] [address]  Lactic acid, plasma     Status: None   Collection Time: 02/10/23  4:08 PM  Result Value Ref Range   Lactic Acid, Venous 1.0 0.5 - 1.9 mmol/L    Comment: Performed at [facility], [address], [place], [place] [address]  Troponin I (High Sensitivity)     Status: None   Collection Time: 02/10/23  4:08 PM  Result Value Ref Range   Troponin I (High Sensitivity) 14 <18 ng/L    Comment: (NOTE) Elevated high sensitivity troponin I (hsTnI) values and significant  changes across serial measurements may suggest ACS but many other  chronic  and acute conditions are known to elevate hsTnI results.  Refer to the "Links" section for chest pain algorithms and additional  guidance. Performed at [facility], [address] [place], [place] [address]     DG Chest Port 1 View  Result Date: 02/10/2023 CLINICAL DATA:  Shortness of breath EXAM: PORTABLE CHEST 1 VIEW COMPARISON:  Radiograph 06/17/2022 FINDINGS: Unchanged cardiomediastinal silhouette. Mild interstitial opacities. There is a small to moderate size right pleural effusion with adjacent basilar opacities. Additional patchy left basilar and left midlung opacities. Unchanged right apical calcification and associated lung opacity. No evidence of pneumothorax. Diffuse osseous metastatic lesions, best visualized on prior CTs. IMPRESSION: Small to moderate size right pleural effusion. Adjacent right lower lung opacities and additional patchy left basilar and left mid lung opacities, could reflect atelectasis and/or pneumonia. Interstitial prominence could reflect a degree of pulmonary edema. Electronically Signed   By: Caprice Renshaw M.D.   On: 02/10/2023 13:59    Pending Labs Unresulted Labs (From admission, onward)     Start     Ordered   02/17/23 0500  Creatinine, serum  (enoxaparin (LOVENOX)    CrCl >/= 30 ml/min)  Weekly,   R     Comments: while on enoxaparin therapy    02/10/23 1759   02/11/23 0500  Basic metabolic panel  Tomorrow morning,   R        02/10/23 1759   02/11/23 0500  CBC  Tomorrow morning,   R        02/10/23 1759   02/10/23 1758  CBC  (enoxaparin (LOVENOX)    CrCl >/= 30 ml/min)  Once,   R       Comments: Baseline for enoxaparin therapy IF NOT ALREADY DRAWN.  Notify MD if PLT < 100 K.    02/10/23 1759   02/10/23 1758  Creatinine, serum  (enoxaparin (LOVENOX)    CrCl >/= 30 ml/min)  Once,   R       Comments: Baseline for enoxaparin therapy IF NOT ALREADY DRAWN.    02/10/23 1759   02/10/23 1407  Resp panel by RT-PCR (RSV, Flu A&B, Covid) Anterior Nasal Swab  (Septic presentation on arrival (screening labs, nursing and treatment orders for obvious sepsis))  Once,   URGENT        02/10/23 1408   02/10/23 1407  Blood Culture (routine x 2)  (Septic presentation on arrival  (screening labs, nursing and treatment orders for obvious sepsis))  BLOOD CULTURE X 2,   STAT      02/10/23 1408            Vitals/Pain Today's Vitals   02/10/23 1519 02/10/23 1545 02/10/23 1730 02/10/23 1800  BP:   126/77 126/77  Pulse:  (!) 113 (!) 111 (!) 112  Resp:  (!) 23 (!) 25 (!) 23  Temp:    98.3 F (36.8 C)  TempSrc:    Oral  SpO2:  96% 96% 96%  Weight:      Height:      PainSc: 0-No pain       Isolation Precautions Airborne and Contact precautions  Medications Medications  HYDROcodone-acetaminophen (NORCO/VICODIN) 5-325 MG per tablet 1 tablet (has no administration in time range)  levothyroxine (SYNTHROID) tablet 125 mcg (has no administration in time range)  pantoprazole (PROTONIX) EC tablet 40 mg (has no administration in time range)  simethicone (MYLICON) chewable tablet 80 mg (has no administration in time range)  sucralfate (CARAFATE) tablet 1 g (has  no administration in time range)  loratadine (CLARITIN) tablet 10 mg (has no administration in time range)  enoxaparin (LOVENOX) injection 40 mg (has no administration in time range)  acetaminophen (TYLENOL) tablet 650 mg (has no administration in time range)    Or  acetaminophen (TYLENOL) suppository 650 mg (has no administration in time range)  traZODone (DESYREL) tablet 25 mg (has no administration in time range)  docusate sodium (COLACE) capsule 100 mg (has no administration in time range)  polyethylene glycol (MIRALAX / GLYCOLAX) packet 17 g (has no administration in time range)  ondansetron (ZOFRAN) tablet 4 mg (has no administration in time range)    Or  ondansetron (ZOFRAN) injection 4 mg (has no administration in time range)  albuterol (PROVENTIL) (2.5 MG/3ML) 0.083% nebulizer solution 2.5 mg (has no administration in time range)  metoprolol tartrate (LOPRESSOR) injection 5 mg (has no administration in time range)  cefTRIAXone (ROCEPHIN) 1 g in sodium chloride 0.9 % 100 mL IVPB (0 g Intravenous  Stopped 02/10/23 1456)  azithromycin (ZITHROMAX) 500 mg in sodium chloride 0.9 % 250 mL IVPB (0 mg Intravenous Stopped 02/10/23 1605)  lactated ringers bolus 500 mL (0 mLs Intravenous Stopped 02/10/23 1640)  lactated ringers bolus 500 mL (0 mLs Intravenous Stopped 02/10/23 1800)    Mobility non-ambulatory     Focused Assessments Neuro Assessment Handoff:  Swallow screen pass?  N/A         Neuro Assessment:   Neuro Checks:      Has TPA been given? No If patient is a Neuro Trauma and patient is going to OR before floor call report to 4N Charge nurse: [phone] or [phone]   R Recommendations: See Admitting Provider Note  Report given to:   Additional Notes:

## 2023-02-10 NOTE — Progress Notes (Signed)
Elink will follow per sepsis protocol  

## 2023-02-10 NOTE — Progress Notes (Signed)
PT off bipap tolerating well on [place]

## 2023-02-10 NOTE — ED Triage Notes (Addendum)
Patient BIB [facility] from home. Seen at urgent care earlier today, her chest x ray says pleural effusion. Has been feeling short of breath for 3 days along with productive cough with green sputum. Has a feeding tube. Has thyroid cancer.  EMS 86% room air, placed on 4L [place] and is 94%% 115/64 115 HR LR 20G left forearm

## 2023-02-11 ENCOUNTER — Inpatient Hospital Stay (HOSPITAL_COMMUNITY): Payer: Medicare Other

## 2023-02-11 DIAGNOSIS — J189 Pneumonia, unspecified organism: Secondary | ICD-10-CM | POA: Diagnosis not present

## 2023-02-11 LAB — RESPIRATORY PANEL BY PCR

## 2023-02-11 LAB — CBC
HCT: 38 % (ref 36.0–46.0)
Hemoglobin: 11.6 g/dL — ABNORMAL LOW (ref 12.0–15.0)
MCH: 25.6 pg — ABNORMAL LOW (ref 26.0–34.0)
MCHC: 30.5 g/dL (ref 30.0–36.0)
MCV: 83.7 fL (ref 80.0–100.0)
Platelets: 268 10*3/uL (ref 150–400)
RBC: 4.54 MIL/uL (ref 3.87–5.11)
RDW: 17.3 % — ABNORMAL HIGH (ref 11.5–15.5)
WBC: 9.9 10*3/uL (ref 4.0–10.5)
nRBC: 0 % (ref 0.0–0.2)

## 2023-02-11 LAB — BASIC METABOLIC PANEL
Anion gap: 10 (ref 5–15)
BUN: 26 mg/dL — ABNORMAL HIGH (ref 8–23)
CO2: 28 mmol/L (ref 22–32)
Calcium: 8.1 mg/dL — ABNORMAL LOW (ref 8.9–10.3)
Chloride: 98 mmol/L (ref 98–111)
Creatinine, Ser: 0.54 mg/dL (ref 0.44–1.00)
GFR, Estimated: >60 mL/min (ref >60)
Glucose, Bld: 314 mg/dL — ABNORMAL HIGH (ref 70–99)
Potassium: 4.1 mmol/L (ref 3.5–5.1)
Sodium: 136 mmol/L (ref 135–145)

## 2023-02-11 LAB — CULTURE, BLOOD (ROUTINE X 2): Special Requests: ADEQUATE

## 2023-02-11 LAB — BLOOD GAS, VENOUS
Acid-Base Excess: 5.4 mmol/L — ABNORMAL HIGH (ref 0.0–2.0)
Bicarbonate: 31.7 mmol/L — ABNORMAL HIGH (ref 20.0–28.0)
O2 Saturation: 95.5 %
Patient temperature: 36.7
pCO2, Ven: 49 mmHg (ref 44–60)
pH, Ven: 7.41 (ref 7.25–7.43)
pO2, Ven: 70 mmHg — ABNORMAL HIGH (ref 32–45)

## 2023-02-11 LAB — GLUCOSE, CAPILLARY
Glucose-Capillary: 153 mg/dL — ABNORMAL HIGH (ref 70–99)
Glucose-Capillary: 173 mg/dL — ABNORMAL HIGH (ref 70–99)
Glucose-Capillary: 124 mg/dL — ABNORMAL HIGH (ref 70–99)

## 2023-02-11 LAB — BODY FLUID CELL COUNT WITH DIFFERENTIAL
Eos, Fluid: 0 %
Lymphs, Fluid: 25 %
Monocyte-Macrophage-Serous Fluid: 4 % — ABNORMAL LOW (ref 50–90)
Neutrophil Count, Fluid: 71 % — ABNORMAL HIGH (ref 0–25)
Total Nucleated Cell Count, Fluid: 1894 cu mm — ABNORMAL HIGH (ref 0–1000)

## 2023-02-11 LAB — LACTATE DEHYDROGENASE, PLEURAL OR PERITONEAL FLUID: LD, Fluid: 97 U/L — ABNORMAL HIGH (ref 3–23)

## 2023-02-11 LAB — MRSA NEXT GEN BY PCR, NASAL: MRSA by PCR Next Gen: NOT DETECTED

## 2023-02-11 LAB — LACTATE DEHYDROGENASE: LDH: 144 U/L (ref 98–192)

## 2023-02-11 MED ORDER — ORAL CARE MOUTH RINSE
15.0000 mL | OROMUCOSAL | Status: DC
  Administered 2023-02-11 – 2023-02-20 (×31): 15 mL via OROMUCOSAL

## 2023-02-11 MED ORDER — ALPRAZOLAM 0.5 MG PO TABS
0.5000 mg | ORAL_TABLET | Freq: Every evening | ORAL | Status: AC | PRN
  Administered 2023-02-11: 0.5 mg via ORAL
  Filled 2023-02-11: qty 1

## 2023-02-11 MED ORDER — CHLORHEXIDINE GLUCONATE CLOTH 2 % EX PADS
6.0000 | MEDICATED_PAD | Freq: Every day | CUTANEOUS | Status: DC
  Administered 2023-02-11 – 2023-02-20 (×9): 6 via TOPICAL

## 2023-02-11 MED ORDER — IOHEXOL 300 MG/ML  SOLN
50.0000 mL | Freq: Once | INTRAMUSCULAR | Status: AC | PRN
  Administered 2023-02-11: 50 mL

## 2023-02-11 MED ORDER — OSMOLITE 1.2 CAL PO LIQD
1000.0000 mL | ORAL | Status: DC
Start: 2023-02-11 — End: 2023-02-11

## 2023-02-11 MED ORDER — AZITHROMYCIN 250 MG PO TABS
500.0000 mg | ORAL_TABLET | Freq: Every day | ORAL | Status: DC
  Administered 2023-02-11: 500 mg via ORAL
  Filled 2023-02-11: qty 2

## 2023-02-11 MED ORDER — FUROSEMIDE 10 MG/ML IJ SOLN
20.0000 mg | Freq: Once | INTRAMUSCULAR | Status: AC
  Administered 2023-02-11: 20 mg via INTRAVENOUS
  Filled 2023-02-11: qty 2

## 2023-02-11 MED ORDER — LIDOCAINE HCL 1 % IJ SOLN
INTRAMUSCULAR | Status: AC
  Filled 2023-02-11: qty 20

## 2023-02-11 MED ORDER — INSULIN ASPART 100 UNIT/ML IJ SOLN
0.0000 [IU] | INTRAMUSCULAR | Status: DC
  Administered 2023-02-12: 2 [IU] via SUBCUTANEOUS
  Administered 2023-02-12: 3 [IU] via SUBCUTANEOUS
  Administered 2023-02-12 – 2023-02-15 (×8): 2 [IU] via SUBCUTANEOUS
  Administered 2023-02-15: 3 [IU] via SUBCUTANEOUS
  Administered 2023-02-15: 2 [IU] via SUBCUTANEOUS
  Administered 2023-02-15 – 2023-02-16 (×2): 3 [IU] via SUBCUTANEOUS
  Administered 2023-02-16 – 2023-02-17 (×6): 2 [IU] via SUBCUTANEOUS
  Administered 2023-02-17: 3 [IU] via SUBCUTANEOUS
  Administered 2023-02-18 – 2023-02-19 (×8): 2 [IU] via SUBCUTANEOUS
  Administered 2023-02-19: 3 [IU] via SUBCUTANEOUS
  Administered 2023-02-20 (×4): 2 [IU] via SUBCUTANEOUS

## 2023-02-11 MED ORDER — OSMOLITE 1.5 CAL PO LIQD
1000.0000 mL | ORAL | Status: DC
  Administered 2023-02-11: 1000 mL
  Filled 2023-02-11 (×2): qty 1000

## 2023-02-11 MED ORDER — ORAL CARE MOUTH RINSE: 15.0000 mL | OROMUCOSAL | Status: DC | PRN

## 2023-02-11 MED ORDER — ZINC OXIDE 40 % EX OINT
TOPICAL_OINTMENT | CUTANEOUS | Status: DC | PRN
  Filled 2023-02-11 (×2): qty 57

## 2023-02-11 NOTE — Progress Notes (Signed)
Patient was transported on BIPAP from ICU to ultrasound and back. No complications noted. Vitals stable throughout.

## 2023-02-11 NOTE — Progress Notes (Signed)
RN placed patient on 15 L high flow nasal cannula. Patient is tolerating well at this time. Vitals stable. RT will continue to monitor.

## 2023-02-11 NOTE — Procedures (Signed)
PROCEDURE SUMMARY:  Successful US guided right thoracentesis. Yielded 500 mL of clightly hazy yellow fluid. Pt tolerated procedure well. No immediate complications.  Specimen were obtained for labs. CXR ordered.  EBL < 5 mL  Brayton El PA-C 02/11/2023 9:44 AM

## 2023-02-11 NOTE — Progress Notes (Signed)
eLink Physician-Brief Progress Note Patient Name: Shannon [name] DOB: 07-04-1941 MRN: [id number]   Date of Service  02/11/2023  HPI/Events of Note  Patient with a complicated past medical history admitted with pneumonia and acute respiratory failure, currently on BIPAP.  eICU Interventions  New Patient Evaluation.        Shannon [name] Shannon [name] 02/11/2023, 12:59 AM

## 2023-02-11 NOTE — Progress Notes (Signed)
PROGRESS NOTE    Shannon [name]  ZDG:[id number] DOB: 11-07-1940 DOA: 02/10/2023 PCP: Richmond Campbell., PA-C   Brief Narrative: Shannon [name] is a 82 y.o. 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 Patient presented secondary to difficulty breathing and was found to have evidence of multifocal pneumonia in addition to respiratory distress and respiratory failure. Empiric antibiotics started, blood cultures obtained and supplemental oxygen/BiPAP provided.   Assessment and Plan:  Multifocal pneumonia Present on admission. No leukocytosis. No procalcitonin available. Blood cultures obtained on admission. Empiric Ceftriaxone and azithromycin started. Complicated by right sided pleural effusion, concerning for probably parapneumonic effusion. -Continue Ceftriaxone/Azithromycin -Obtain MRSA swab -Continue supportive care -Sputum culture  Severe sepsis Present on admission. Patient with tachycardia and tachypnea with associated respiratory distress and failure. Blood cultures obtained on admission and are pending. Empiric antibiotics started on admission. -Continue Ceftriaxone/azithromycin -Follow-up blood cultures  Acute respiratory failure with hypoxia Respiratory distress Secondary to multifocal pneumonia and complicated by right sided pleural effusion and pulmonary edema. Patient with significant respiratory effort on admission requiring BiPAP. -Continue oxygen/BiPAP to keep O2 saturations >90% -Wean to room air as able  Right pleural effusion Small-moderate in size. Possibly contributing to respiratory failure. Likely related to pneumonia and/or underlying cancer. Thoracentesis ordered and performed with a total of 500 mL of hazy yellow fluid aspirated. Preliminary cell count with an elevated WBC of 1,894, predominantly neutrophils. -Follow-up pleural culture/gram stain  and pathology  Pulmonary edema Noted on imaging with associated respiratory failure -Lasix IV x1 -Watch urine output  Urinary retention Patient required in/out catheterization overnight with 500 mL output. -Serial bladder scans with repeat in/out as needed  Hypothyroidism -Continue Synthroid  Chronic diarrhea Possible related to tube feeds. Stable.  Chronic oropharyngeal dysphagia Underweight Dysphagia related to prior neck surgery. PEG tube in place. PEG has had some drainage per family. Surrounding skin appears erythematous and irritated likely from leaking. -Abdominal x-ray to check placement; may need to discuss with radiology but unsure anything can be done  -Tube feeds per dietitian  Medullary thyroid cancer Patient follows with Dr. Candise Che as an outpatient. Not currently on therapy.  Breast cancer Lung cancer Noted history.  DVT prophylaxis: Lovenox Code Status:   Code Status: Full Code Family Communication: Granddaughter and daughter-in-law at bedside Disposition Plan: Discharge pending ability to wean oxygen if able, continued treatment of pneumonia   Consultants:  Palliative care medicine  Procedures:  4/13: Right thoracentesis  Antimicrobials: Ceftriaxone Azithromycin    Subjective: Patient with dyspnea. Unable to speak much secondary to BiPAP  Objective: BP 132/71   Pulse (!) 123   Temp 98 F (36.7 C) (Oral)   Resp (!) 32   Ht 5\' 4"  (1.626 m)   Wt 38.2 kg   SpO2 100%   BMI 14.46 kg/m   Examination:  General exam: Appears slightly agitated. Frail and chronically ill appearing. Respiratory system: Diminished, rhonchi. Respiratory effort normal on BiPAP Cardiovascular system: S1 & S2 heard, increased rate with normal rhythm. Gastrointestinal system: Abdomen is nondistended, soft and nontender. No organomegaly or masses felt. Normal bowel sounds heard. Central nervous system: Alert and oriented. Musculoskeletal: No edema. No calf  tenderness   Data Reviewed: I have personally reviewed following labs and imaging studies  CBC Lab Results  Component Value Date   WBC 9.9 02/11/2023   RBC 4.54 02/11/2023   HGB 11.6 (L) 02/11/2023   HCT  38.0 02/11/2023   MCV 83.7 02/11/2023   MCH 25.6 (L) 02/11/2023   PLT 268 02/11/2023   MCHC 30.5 02/11/2023   RDW 17.3 (H) 02/11/2023   LYMPHSABS 0.2 (L) 02/10/2023   MONOABS 1.1 (H) 02/10/2023   EOSABS 0.0 02/10/2023   BASOSABS 0.1 02/10/2023     Last metabolic panel Lab Results  Component Value Date   NA 136 02/11/2023   K 4.1 02/11/2023   CL 98 02/11/2023   CO2 28 02/11/2023   BUN 26 (H) 02/11/2023   CREATININE 0.54 02/11/2023   GLUCOSE 314 (H) 02/11/2023   GFRNONAA >60 02/11/2023   GFRAA >60 06/22/2020   CALCIUM 8.1 (L) 02/11/2023   PHOS 3.2 09/22/2021   PROT 7.0 11/14/2022   ALBUMIN 3.9 11/14/2022   BILITOT 0.6 11/14/2022   ALKPHOS 91 11/14/2022   AST 22 11/14/2022   ALT 23 11/14/2022   ANIONGAP 10 02/11/2023    GFR: Estimated Creatinine Clearance: 32.7 mL/min (by C-G formula based on SCr of 0.54 mg/dL).  Recent Results (from the past 240 hour(s))  SARS Coronavirus 2 by RT PCR (hospital order, performed in [facility] hospital lab) *cepheid single result test* Anterior Nasal Swab     Status: None   Collection Time: 02/10/23  1:17 PM   Specimen: Anterior Nasal Swab  Result Value Ref Range Status   SARS Coronavirus 2 by RT PCR NEGATIVE NEGATIVE Final    Comment: (NOTE) SARS-CoV-2 target nucleic acids are NOT DETECTED.  The SARS-CoV-2 RNA is generally detectable in upper and lower respiratory specimens during the acute phase of infection. The lowest concentration of SARS-CoV-2 viral copies this assay can detect is 250 copies / mL. A negative result does not preclude SARS-CoV-2 infection and should not be used as the sole basis for treatment or other patient management decisions.  A negative result may occur with improper specimen collection /  handling, submission of specimen other than nasopharyngeal swab, presence of viral mutation(s) within the areas targeted by this assay, and inadequate number of viral copies (<250 copies / mL). A negative result must be combined with clinical observations, patient history, and epidemiological information.  Fact Sheet for Patients:   RoadLapTop.co.za  Fact Sheet for Healthcare Providers: http://kim-miller.com/  This test is not yet approved or  cleared by the [place] FDA and has been authorized for detection and/or diagnosis of SARS-CoV-2 by FDA under an Emergency Use Authorization (EUA).  This EUA will remain in effect (meaning this test can be used) for the duration of the COVID-19 declaration under Section 564(b)(1) of the Act, 21 U.S.C. section 360bbb-3(b)(1), unless the authorization is terminated or revoked sooner.  Performed at [facility], [address] [place], [place] [address]   Blood Culture (routine x 2)     Status: None (Preliminary result)   Collection Time: 02/10/23  1:17 PM   Specimen: BLOOD  Result Value Ref Range Status   Specimen Description   Final    BLOOD RIGHT ANTECUBITAL Performed at [facility], [address] [place], [place] [address]    Special Requests   Final    BOTTLES DRAWN AEROBIC AND ANAEROBIC Blood Culture adequate volume Performed at [facility], [address] [place], [place] [address]    Culture   Final    NO GROWTH < 24 HOURS Performed at [facility], [address], [place], [place] [address]    Report Status PENDING  Incomplete  Radiology Studies: US THORACENTESIS ASP PLEURAL SPACE W/IMG GUIDE  Result Date: 02/11/2023 INDICATION: Shortness of breath. Right-sided pleural effusion. Request for thoracentesis. EXAM: ULTRASOUND GUIDED RIGHT THORACENTESIS MEDICATIONS: 1% plain lidocaine, 4 mL COMPLICATIONS: None  immediate. PROCEDURE: An ultrasound guided thoracentesis was thoroughly discussed with the patient and questions answered. The benefits, risks, alternatives and complications were also discussed. The patient understands and wishes to proceed with the procedure. Written consent was obtained. Ultrasound was performed to localize and mark an adequate pocket of fluid in the right chest. The area was then prepped and draped in the normal sterile fashion. 1% Lidocaine was used for local anesthesia. Under ultrasound guidance a 6 Fr Safe-T-Centesis catheter was introduced. Thoracentesis was performed. The catheter was removed and a dressing applied. FINDINGS: A total of approximately 500 mL of slightly hazy yellow fluid was removed. Samples were obtained for labs if requested by the clinical team. IMPRESSION: Successful ultrasound guided right thoracentesis yielding 500 mL of pleural fluid. Read by: Brayton El PA-C Electronically Signed   By: Gilmer Mor D.O.   On: 02/11/2023 11:36   DG Chest 1 View  Result Date: 02/11/2023 CLINICAL DATA:  Thoracentesis today EXAM: CHEST  1 VIEW COMPARISON:  Yesterday FINDINGS: Lucency at the right base could be reaerated lung or small loculated pneumothorax. No apical pneumothorax. Bilateral airspace disease and pleural thickening at the apices. IMPRESSION: 1. Improved aeration on the right. Lucency at the lateral right costophrenic sulcus could be aerated lung or tiny loculated pneumothorax. No large or apical pneumothorax. 2. Bilateral airspace disease. Electronically Signed   By: Tiburcio Pea M.D.   On: 02/11/2023 09:58   DG Chest Port 1 View  Result Date: 02/10/2023 CLINICAL DATA:  Shortness of breath EXAM: PORTABLE CHEST 1 VIEW COMPARISON:  Radiograph 06/17/2022 FINDINGS: Unchanged cardiomediastinal silhouette. Mild interstitial opacities. There is a small to moderate size right pleural effusion with adjacent basilar opacities. Additional patchy left basilar and left  midlung opacities. Unchanged right apical calcification and associated lung opacity. No evidence of pneumothorax. Diffuse osseous metastatic lesions, best visualized on prior CTs. IMPRESSION: Small to moderate size right pleural effusion. Adjacent right lower lung opacities and additional patchy left basilar and left mid lung opacities, could reflect atelectasis and/or pneumonia. Interstitial prominence could reflect a degree of pulmonary edema. Electronically Signed   By: Caprice Renshaw M.D.   On: 02/10/2023 13:59      LOS: 1 day    Jacquelin Hawking, MD [facility] 02/11/2023, 11:56 AM   If 7PM-7AM, please contact night-coverage [email or web address]

## 2023-02-11 NOTE — Hospital Course (Signed)
Shannon [name] is a 82 y.o. 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 Patient presented secondary to difficulty breathing and was found to have evidence of multifocal pneumonia in addition to respiratory distress and respiratory failure. Empiric antibiotics started, blood cultures obtained and supplemental oxygen/BiPAP provided.

## 2023-02-11 NOTE — Progress Notes (Addendum)
    Patient Name: Shannon [name]           DOB: 1941/07/18  MRN: [id number]      Admission Date: 02/10/2023  Attending Provider: Maryln Gottron, MD  Primary Diagnosis: Multifocal pneumonia   Level of care: Stepdown    CROSS COVER NOTE   Date of Service   02/11/2023   [name], 82 y.o. female, was admitted on 02/10/2023 for Multifocal pneumonia.    HPI/Events of Note   Notified by bedside RN of patient being tachycardic, tachypneic, mildly hypoxic on 8 L nasal cannula.  Patient was previously on 4 L [place].  At this time, patient is refusing BiPAP.  During bedside assessment, patient is alert and oriented x 4.  Does appear to be anxious.  Denies chest pain, palpitations, abdominal discomfort, nausea, vomiting.  She does endorse shortness of breath, cough, anxiety.  Family endorses patient is normally tachycardic, HR 100-110s at baseline?  Breath sounds are coarse, accessory muscle use also noted.  Patient has a clear understanding that she would benefit from BiPAP use, however feels that she would not be able to tolerate it.  According to her son, she takes 1 mg Xanax twice daily as needed.  Will place order for low-dose Xanax.  She is now agreeable to wear BiPAP.  Family at bedside has been updated.   0130- appears to be tolerating BiPAP well.  Nursing staff to continue monitoring for acute changes.  Interventions/ Plan   Transfer to stepdown EKG-sinus tachycardia without ST segment elevation. Xanax 0.5 mg x 1 Solu-Medrol VBG BiPAP therapy-         Anthoney Harada, DNP, ACNPC- AG [facility] [place]

## 2023-02-11 NOTE — Progress Notes (Signed)
Called d/t pt refusing to wear BIPAP.  See VS flowsheet.  Pt anxious and denies CP.  Pt is SOB and has coarse lung sounds.  New orders placed by [facility], NP who is at the pt bedside.  Pt has agreed to wear BIPAP if she has too.  Pt will transfer to SDU for closer monitoring per [facility], NP.

## 2023-02-11 NOTE — Progress Notes (Signed)
Initial Nutrition Assessment  DOCUMENTATION CODES:   Underweight  INTERVENTION:  Recommend providing continuous tube feeds via G-tube at this time: -Initiate Osmolite 1.5 at 20 mL/hour and advance by 10 mL/hour every 8 hours to goal rate of 50 mL/hour (1200 mL goal daily volume) -Provides: 1800 kcal, 75 grams of protein, 912 mL H2O daily  Recommend providing free water flush of at least 30 mL every 4 hours per protocol to maintain tube patency as pt reports good PO intake of liquids. Recommend monitoring po intake of fluids and adjusting water flushes as needed.  As respiratory status improves, and once able to clarify home tube feed regimen with Toma Copier, can consider transitioning back to bolus regimen prior to discharge.   NUTRITION DIAGNOSIS:   Inadequate oral intake related to dysphagia as evidenced by  (reliance on G-tube to meet nutrition and hydration needs.).  GOAL:   Patient will meet greater than or equal to 90% of their needs  MONITOR:   PO intake, Labs, Weight trends, TF tolerance, I & O's  REASON FOR ASSESSMENT:   Consult Assessment of nutrition requirement/status  ASSESSMENT:   82 year old female with PMHx of left-sided breast cancer, lung cancer, chronic diarrhea, metastatic medullary thyroid carcinoma, G-tube dependence admitted with multifocal PNA.  Pt previously on BiPAP earlier in the day but has now transitioned to HFNC 15 L/min. Initially no answer when attempted to cal room. Attempted to call Neoma Laming and Nettye Kutter (contacts listed in chart) but both calls went to voice mail. After speaking with RN, learned that pt would be able to talk over the phone and that there was a family member at bedside. Called back into room and able to speak with Darl Pikes (patient's daughter in law) and patient over the phone. Darl Pikes reports Toma Copier provides tube feeds for patient and would be able to answer more questions, but she is at home sleeping so that is why she is  currently unavailable. She reports pt does attempt to eat by mouth but eats very little and relies on tube feeds via G-tube. She may have bites at meals. She reports she does fairly well with drinking liquids by mouth. Pt reports she receives a small amount of water flushes per tube, but is unsure how much she receives or how often. Pt was last seen by outpatient RD at cancer center on 12/12/22. At that time pt was receiving 7 cartons of Osmolite 1.5 spread over 5 feeds daily. Pt reports she receives her feeds via syringe. She can tolerate 1-2 cartons at a time. 7 cartons of Osmolite 1.5 would provide 2485 kcal (65 kcal/kg/day), 104 grams of protein (2.7 grams/kg/day), and 1267 mL H2O daily. Pt reports she is not sure she is receiving 7 cartons daily anymore as she believes the number of cartons was reduced. She is unsure how many she is receiving daily now. Per review of chart RD notes DME is [employer].  Weights appear to fluctuate between 41-43 kg over the past year. Weight on 4/12 was 43.1 kg (95 lbs). Current weight today is lower at 38.2 kg (84.22 lbs). Will continue to monitor trends.  Enteral Access: G-tube present on admission  Currently ordered for Osmolite 1.5 1 carton QID per tube (received 1 carton so far today)  Medications reviewed and include: azithromycin, Colace 100 mg BID, levothyroxine, pantoprazole, simethicone, Carafate 1 gram TID and QHS, ceftriaxone  Labs reviewed: BUN 26  UOP: 950 mL  I/O: +456.5 mL since admission  Discussed with MD.  Okay for RD to adjust tube feeds. Will utilize protocol.  Discussed with RN.  NUTRITION - FOCUSED PHYSICAL EXAM:  Unable to complete as RD is working remotely.  Diet Order:   Diet Order             Diet regular Room service appropriate? Yes; Fluid consistency: Thin  Diet effective now                  EDUCATION NEEDS:   No education needs have been identified at this time  Skin:  Skin Assessment: Reviewed RN Assessment  Last  BM:  02/11/23- small type 7  Height:   Ht Readings from Last 1 Encounters:  02/11/23 5\' 4"  (1.626 m)   Weight:   Wt Readings from Last 1 Encounters:  02/11/23 38.2 kg   Ideal Body Weight:  54.5 kg  BMI:  Body mass index is 14.46 kg/m.  Estimated Nutritional Needs:   Kcal:  1650-1850  Protein:  70-85 grams  Fluid:  >/= 1.6 L/day  Letta Median, MS, RD, LDN, CNSC Pager number available on Amion

## 2023-02-12 ENCOUNTER — Inpatient Hospital Stay (HOSPITAL_COMMUNITY): Payer: Medicare Other

## 2023-02-12 ENCOUNTER — Encounter (HOSPITAL_COMMUNITY): Payer: Self-pay | Admitting: Internal Medicine

## 2023-02-12 DIAGNOSIS — J918 Pleural effusion in other conditions classified elsewhere: Secondary | ICD-10-CM | POA: Diagnosis not present

## 2023-02-12 DIAGNOSIS — J189 Pneumonia, unspecified organism: Secondary | ICD-10-CM | POA: Diagnosis not present

## 2023-02-12 DIAGNOSIS — K529 Noninfective gastroenteritis and colitis, unspecified: Secondary | ICD-10-CM

## 2023-02-12 DIAGNOSIS — C73 Malignant neoplasm of thyroid gland: Secondary | ICD-10-CM

## 2023-02-12 DIAGNOSIS — Z515 Encounter for palliative care: Secondary | ICD-10-CM | POA: Diagnosis not present

## 2023-02-12 DIAGNOSIS — I4891 Unspecified atrial fibrillation: Secondary | ICD-10-CM

## 2023-02-12 DIAGNOSIS — J90 Pleural effusion, not elsewhere classified: Secondary | ICD-10-CM | POA: Diagnosis not present

## 2023-02-12 DIAGNOSIS — R0603 Acute respiratory distress: Secondary | ICD-10-CM | POA: Diagnosis not present

## 2023-02-12 DIAGNOSIS — Z7189 Other specified counseling: Secondary | ICD-10-CM

## 2023-02-12 LAB — GLUCOSE, CAPILLARY
Glucose-Capillary: 110 mg/dL — ABNORMAL HIGH (ref 70–99)
Glucose-Capillary: 116 mg/dL — ABNORMAL HIGH (ref 70–99)
Glucose-Capillary: 121 mg/dL — ABNORMAL HIGH (ref 70–99)
Glucose-Capillary: 122 mg/dL — ABNORMAL HIGH (ref 70–99)
Glucose-Capillary: 123 mg/dL — ABNORMAL HIGH (ref 70–99)
Glucose-Capillary: 147 mg/dL — ABNORMAL HIGH (ref 70–99)
Glucose-Capillary: 158 mg/dL — ABNORMAL HIGH (ref 70–99)
Glucose-Capillary: 163 mg/dL — ABNORMAL HIGH (ref 70–99)
Glucose-Capillary: 90 mg/dL (ref 70–99)

## 2023-02-12 LAB — ECHOCARDIOGRAM COMPLETE
AR max vel: 1.75 cm2
AV Area VTI: 1.61 cm2
AV Area mean vel: 1.48 cm2
AV Mean grad: 5 mmHg
AV Peak grad: 6.9 mmHg
Ao pk vel: 1.31 m/s
Area-P 1/2: 3.72 cm2
Height: 64 in
MV M vel: 4.86 m/s
MV Peak grad: 94.5 mmHg
S' Lateral: 4 cm
Single Plane A4C EF: 54.2 %
Weight: 1347.45 oz

## 2023-02-12 LAB — TROPONIN I (HIGH SENSITIVITY)
Troponin I (High Sensitivity): 17 ng/L (ref <18)
Troponin I (High Sensitivity): 14 ng/L (ref <18)

## 2023-02-12 LAB — CBC
HCT: 39.4 % (ref 36.0–46.0)
Hemoglobin: 12.3 g/dL (ref 12.0–15.0)
MCH: 25.9 pg — ABNORMAL LOW (ref 26.0–34.0)
MCHC: 31.2 g/dL (ref 30.0–36.0)
MCV: 83.1 fL (ref 80.0–100.0)
Platelets: 303 10*3/uL (ref 150–400)
RBC: 4.74 MIL/uL (ref 3.87–5.11)
RDW: 17 % — ABNORMAL HIGH (ref 11.5–15.5)
WBC: 12.1 10*3/uL — ABNORMAL HIGH (ref 4.0–10.5)
nRBC: 0 % (ref 0.0–0.2)

## 2023-02-12 LAB — MAGNESIUM: Magnesium: 2.2 mg/dL (ref 1.7–2.4)

## 2023-02-12 LAB — COMPREHENSIVE METABOLIC PANEL
ALT: 27 U/L (ref 0–44)
AST: 25 U/L (ref 15–41)
Albumin: 2.5 g/dL — ABNORMAL LOW (ref 3.5–5.0)
Alkaline Phosphatase: 111 U/L (ref 38–126)
Anion gap: 12 (ref 5–15)
BUN: 26 mg/dL — ABNORMAL HIGH (ref 8–23)
CO2: 30 mmol/L (ref 22–32)
Calcium: 8.3 mg/dL — ABNORMAL LOW (ref 8.9–10.3)
Chloride: 99 mmol/L (ref 98–111)
Creatinine, Ser: 0.54 mg/dL (ref 0.44–1.00)
GFR, Estimated: >60 mL/min (ref >60)
Glucose, Bld: 125 mg/dL — ABNORMAL HIGH (ref 70–99)
Potassium: 3.3 mmol/L — ABNORMAL LOW (ref 3.5–5.1)
Sodium: 141 mmol/L (ref 135–145)
Total Bilirubin: 0.6 mg/dL (ref 0.3–1.2)
Total Protein: 6.3 g/dL — ABNORMAL LOW (ref 6.5–8.1)

## 2023-02-12 LAB — HEMOGLOBIN A1C
Hgb A1c MFr Bld: 6 % — ABNORMAL HIGH (ref 4.8–5.6)
Mean Plasma Glucose: 125.5 mg/dL

## 2023-02-12 LAB — CULTURE, BLOOD (ROUTINE X 2): Culture: NO GROWTH

## 2023-02-12 LAB — BODY FLUID CULTURE W GRAM STAIN

## 2023-02-12 LAB — PHOSPHORUS: Phosphorus: 2.9 mg/dL (ref 2.5–4.6)

## 2023-02-12 LAB — PROCALCITONIN: Procalcitonin: >150 ng/mL

## 2023-02-12 MED ORDER — AZITHROMYCIN 250 MG PO TABS
500.0000 mg | ORAL_TABLET | Freq: Every day | ORAL | Status: DC
  Administered 2023-02-12: 500 mg
  Filled 2023-02-12: qty 2

## 2023-02-12 MED ORDER — SODIUM CHLORIDE (PF) 0.9 % IJ SOLN
INTRAMUSCULAR | Status: AC
  Filled 2023-02-12: qty 50

## 2023-02-12 MED ORDER — DEXMEDETOMIDINE HCL IN NACL 200 MCG/50ML IV SOLN
0.0000 ug/kg/h | INTRAVENOUS | Status: DC
  Administered 2023-02-12: 0.4 ug/kg/h via INTRAVENOUS
  Filled 2023-02-12: qty 50

## 2023-02-12 MED ORDER — LEVOTHYROXINE SODIUM 25 MCG PO TABS
125.0000 ug | ORAL_TABLET | Freq: Every day | ORAL | Status: DC
  Administered 2023-02-13 – 2023-02-14 (×2): 125 ug
  Filled 2023-02-12 (×2): qty 1

## 2023-02-12 MED ORDER — DEXMEDETOMIDINE HCL IN NACL 400 MCG/100ML IV SOLN
INTRAVENOUS | Status: AC
  Administered 2023-02-12: 0.4 ug/kg/h via INTRAVENOUS
  Filled 2023-02-12: qty 100

## 2023-02-12 MED ORDER — LORATADINE 10 MG PO TABS
10.0000 mg | ORAL_TABLET | Freq: Every day | ORAL | Status: DC
  Administered 2023-02-12: 10 mg
  Filled 2023-02-12: qty 1

## 2023-02-12 MED ORDER — ALPRAZOLAM 0.5 MG PO TABS
0.5000 mg | ORAL_TABLET | Freq: Once | ORAL | Status: AC | PRN
  Administered 2023-02-12: 0.5 mg
  Filled 2023-02-12 (×2): qty 1

## 2023-02-12 MED ORDER — SODIUM CHLORIDE 0.9 % IV SOLN: 500.0000 mg | INTRAVENOUS | Status: DC

## 2023-02-12 MED ORDER — SUCRALFATE 1 GM/10ML PO SUSP
1.0000 g | Freq: Three times a day (TID) | ORAL | Status: DC
  Administered 2023-02-12: 1 g

## 2023-02-12 MED ORDER — METOPROLOL TARTRATE 5 MG/5ML IV SOLN
INTRAVENOUS | Status: AC
  Filled 2023-02-12: qty 5

## 2023-02-12 MED ORDER — POTASSIUM CHLORIDE 20 MEQ PO PACK
40.0000 meq | PACK | Freq: Once | ORAL | Status: AC
  Administered 2023-02-12: 40 meq
  Filled 2023-02-12: qty 2

## 2023-02-12 MED ORDER — IOHEXOL 350 MG/ML SOLN
100.0000 mL | Freq: Once | INTRAVENOUS | Status: AC | PRN
  Administered 2023-02-12: 100 mL via INTRAVENOUS

## 2023-02-12 MED ORDER — TRAZODONE HCL 50 MG PO TABS: 25.0000 mg | ORAL_TABLET | Freq: Every evening | ORAL | Status: DC | PRN

## 2023-02-12 MED ORDER — PANTOPRAZOLE SODIUM 40 MG IV SOLR
40.0000 mg | Freq: Two times a day (BID) | INTRAVENOUS | Status: DC
  Administered 2023-02-12 – 2023-02-20 (×17): 40 mg via INTRAVENOUS
  Filled 2023-02-12 (×17): qty 10

## 2023-02-12 MED ORDER — PIPERACILLIN-TAZOBACTAM 3.375 G IVPB
3.3750 g | Freq: Three times a day (TID) | INTRAVENOUS | Status: AC
  Administered 2023-02-12 – 2023-02-17 (×14): 3.375 g via INTRAVENOUS
  Filled 2023-02-12 (×15): qty 50

## 2023-02-12 MED ORDER — GUAIFENESIN-DM 100-10 MG/5ML PO SYRP: 5.0000 mL | ORAL_SOLUTION | Freq: Four times a day (QID) | ORAL | Status: DC | PRN

## 2023-02-12 MED ORDER — METOPROLOL TARTRATE 5 MG/5ML IV SOLN
2.5000 mg | Freq: Once | INTRAVENOUS | Status: AC
  Administered 2023-02-12: 2.5 mg via INTRAVENOUS

## 2023-02-12 MED ORDER — DEXTROSE-NACL 5-0.45 % IV SOLN: INTRAVENOUS | Status: DC

## 2023-02-12 MED ORDER — DOCUSATE SODIUM 50 MG/5ML PO LIQD: 100.0000 mg | Freq: Two times a day (BID) | ORAL | Status: DC

## 2023-02-12 MED ORDER — SUCRALFATE 1 GM/10ML PO SUSP
1.0000 g | Freq: Three times a day (TID) | ORAL | Status: DC
  Filled 2023-02-12: qty 10

## 2023-02-12 MED ORDER — HYDROCODONE-ACETAMINOPHEN 5-325 MG PO TABS: 1.0000 | ORAL_TABLET | Freq: Four times a day (QID) | ORAL | Status: DC | PRN

## 2023-02-12 MED ORDER — LOPERAMIDE HCL 1 MG/7.5ML PO SUSP: 2.0000 mg | ORAL | Status: DC | PRN

## 2023-02-12 MED ORDER — SIMETHICONE 40 MG/0.6ML PO SUSP
80.0000 mg | Freq: Three times a day (TID) | ORAL | Status: DC
  Administered 2023-02-12: 80 mg
  Filled 2023-02-12 (×2): qty 1.2

## 2023-02-12 NOTE — Progress Notes (Addendum)
ICU Consult    NAME:  Shannon [name], MRN:  [id number], DOB:  1941-06-12, LOS: 2 ADMISSION DATE:  02/10/2023, CONSULTATION DATE:  02/12/2023 REFERRING: Johann Capers ACNPC-AG    CHIEF COMPLAINT:  Respiratory failure (admission Dx) -requiring BiPAP and Precedex  Brief History    82 year old female history of left-sided breast cancer, lung cancer, diarrhea, oropharyngeal dysphagia s/p PEG/tube feeds, medullary thyroid cancer. Patient originally difficulty breathing found to have evidence of multifocal pneumonia in addition to respiratory distress/respiratory failure.  Overnight desaturation/anxiety/tachycardia requiring Precedex (E-Link) and placement onto BiPAP, single dose Lopressor.  CCM consulted for Precedex.  Consults:  CCM   Antimicrobials:  Rocephin 1 gram  Objective   Blood pressure 136/72, pulse (!) 106, temperature 98.4 F (36.9 C), temperature source Axillary, resp. rate (!) 22, height 5\' 4"  (1.626 m), weight 38.2 kg, SpO2 93 %.    FiO2 (%):  [50 %] 50 %   Intake/Output Summary (Last 24 hours) at 02/12/2023 0413 Last data filed at 02/11/2023 2240 Gross per 24 hour  Intake 200 ml  Output 1000 ml  Net -800 ml   Filed Weights   02/10/23 1314 02/11/23 0008  Weight: 43.1 kg 38.2 kg    Assessment & Plan:  Precedex ordered - E-Link Consult CCM   Best practice:  Diet: NPO Pain/Anxiety/Delirium protocol  DVT: Lovenox Code Status: FULL Family Communication: Available in Room Disposition: Remain in ICU - now ICU status due to medication type   Labs   CBC: Recent Labs  Lab 02/10/23 1317 02/10/23 1804 02/11/23 0027 02/12/23 0305  WBC 9.2 9.6 9.9 12.1*  NEUTROABS 7.7  --   --   --   HGB 12.8 11.6* 11.6* 12.3  HCT 41.3 37.0 38.0 39.4  MCV 83.8 83.3 83.7 83.1  PLT 258 238 268 303    Basic Metabolic Panel: Recent Labs   Lab 02/10/23 1317 02/10/23 1804 02/11/23 0027  NA 137  --  136  K 4.4  --  4.1  CL 96*  --  98  CO2 31  --  28  GLUCOSE 139*  --  314*  BUN 35*  --  26*  CREATININE 0.67 0.63 0.54  CALCIUM 8.6*  --  8.1*   GFR: Estimated Creatinine Clearance: 32.7 mL/min (by C-G formula based on SCr of 0.54 mg/dL). Recent Labs  Lab 02/10/23 1317 02/10/23 1608 02/10/23 1804 02/11/23 0027 02/12/23 0305  WBC 9.2  --  9.6 9.9 12.1*  LATICACIDVEN 1.3 1.0  --   --   --     Liver Function Tests: No results for input(s): "AST", "ALT", "ALKPHOS", "BILITOT", "PROT", "ALBUMIN" in the last 168 hours. No  results for input(s): "LIPASE", "AMYLASE" in the last 168 hours. No results for input(s): "AMMONIA" in the last 168 hours.  ABG    Component Value Date/Time   HCO3 31.7 (H) 02/11/2023 0027   O2SAT 95.5 02/11/2023 0027     Coagulation Profile: No results for input(s): "INR", "PROTIME" in the last 168 hours.  Cardiac Enzymes: No results for input(s): "CKTOTAL", "CKMB", "CKMBINDEX", "TROPONINI" in the last 168 hours.  HbA1C: No results found for: "HGBA1C"  CBG: Recent Labs  Lab 02/11/23 0537 02/11/23 1552 02/11/23 1941 02/12/23 0010  GLUCAP 153* 173* 124* 163*    Past Medical History  She,  has a past medical history of Anxiety, Breast cancer (1999), Cancer, Colon polyp, Contact lens/glasses fitting, Lung cancer (2015), Medullary carcinoma of thyroid (2011), and Osteoporosis.   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   CATARACT EXTRACTION W/ INTRAOCULAR LENS IMPLANT  2009   BOTH EYES   FEMUR IM NAIL Left 10/23/2017   Procedure: INTRAMEDULLARY (IM) NAIL FEMORAL;  Surgeon: Durene Romans, MD;  Location: [facility] OR;  Service: Orthopedics;  Laterality: Left;   FOOT SURGERY  1070   RIGHT FOOT -  PINCHED NERVE   IR GASTROSTOMY TUBE MOD SED  01/13/2021   IR REPLACE G-TUBE SIMPLE WO FLUORO  03/08/2022   IR REPLACE G-TUBE SIMPLE WO FLUORO  11/01/2022   IR REPLC GASTRO/COLONIC TUBE PERCUT W/FLUORO  05/17/2022   LUNG SURGERY     CALCIFICATIONS, POSITIVE - CANCER LYMPHNODE    MELANOMA EXCISION  2008   CHEST AREA   NECK SURGERY  Nov 09 2011   LYMPHNODES REMOVED 2 POSITIVE    RIGHT COLECTOMY  06/02/2009   THYROIDECTOMY  11/06/2009   TONSILLECTOMY AND ADENOIDECTOMY       Social History   reports that she quit smoking about 62 years ago. Her smoking use included cigarettes. She has never used smokeless tobacco. She reports that she does not drink alcohol and does not use drugs.   Family History   Her family history includes Breast cancer in her maternal aunt and sister; Cancer in her brother, cousin, father, paternal aunt, sister, and another family member.   Allergies Allergies  Allergen Reactions   Codeine Other (See Comments)    hyper Other reaction(s): insomnia     Home Medications  Prior to Admission medications   Medication Sig Start Date End Date Taking? Authorizing Provider  albuterol (VENTOLIN HFA) 108 (90 Base) MCG/ACT inhaler Inhale 1-2 puffs into the lungs every 6 (six) hours as needed for wheezing or shortness of breath. 06/16/22  Yes [provider]  ALPRAZolam Prudy Feeler) 1 MG tablet Take 0.5 tablets (0.5 mg total) by mouth 2 (two) times daily. 10/27/17  Yes Rhetta Mura, MD  bacitracin ophthalmic ointment Place 1 Application into both eyes 3 (three) times daily. 08/15/20  Yes [provider]  Calcium Carbonate-Vit D-Min (CALCIUM 600 + MINERALS PO) Take 1 tablet by mouth daily.   Yes [provider]  cetirizine (ZYRTEC) 10 MG tablet Take 10 mg by mouth daily.   Yes [provider]  Cholecalciferol (VITAMIN D) 1000 UNITS capsule Take 1,000 Units by mouth daily.   Yes [provider]  diphenhydramine-acetaminophen (TYLENOL PM  EXTRA STRENGTH) 25-500 MG TABS tablet Take 1 tablet by mouth  at bedtime as needed (sleep / pain).   Yes [provider]  Glucosamine-Chondroitin 250-200 MG CAPS Take 1 tablet by mouth daily. Name of table is SCHIFF 12/23/10  Yes [provider]  latanoprost (XALATAN) 0.005 % ophthalmic solution Place 1 drop into both eyes at bedtime. 01/18/23  Yes [provider]  levothyroxine (SYNTHROID) 125 MCG tablet Take 1 tablet by mouth once daily 01/17/23  Yes Shamleffer, Konrad Dolores, MD  loperamide (IMODIUM A-D) 2 MG tablet Take 2 mg by mouth 3 (three) times daily.   Yes [provider]  Multiple Vitamin (MULTIVITAMIN) tablet Take 1 tablet by mouth daily.   Yes [provider]  Nutritional Supplements (FEEDING SUPPLEMENT, OSMOLITE 1.5 CAL,) LIQD Take 237 mLs by mouth 5 (five) times daily. 10/26/22  Yes [provider]  ondansetron (ZOFRAN) 8 MG tablet Take 1 tablet by mouth every 8 hours as needed for nausea or vomiting. 01/06/23  Yes Johney Maine, MD  pantoprazole (PROTONIX) 40 MG tablet TAKE 1 TABLET BY MOUTH TWICE DAILY BEFORE A MEAL Patient taking differently: Take 40 mg by mouth 2 (two) times daily before a meal. 03/31/22  Yes Kale, Corene Cornea, MD  sucralfate (CARAFATE) 1 g tablet TAKE 1 TABLET BY MOUTH 4 TIMES DAILY WITH MEALS AND AT BEDTIME Patient taking differently: Take 1 g by mouth See admin instructions. TAKE 1 TABLET BY MOUTH 4 TIMES DAILY WITH MEALS AND AT BEDTIME 11/14/22  Yes Johney Maine, MD  vitamin E 180 MG (400 UNITS) capsule Take 400 Units by mouth daily.   Yes [provider]  zoledronic acid (RECLAST) 5 MG/100ML SOLN injection Inject 5 mg into the vein once.   Yes [provider]  cholestyramine (QUESTRAN) 4 g packet DISSOLVE 1 PACKET IN LIQUID OF CHOICE AND DRINK 2 TIMES A DAY Patient not taking: Reported on 02/11/2023 04/09/21   Shamleffer, Konrad Dolores, MD  HYDROcodone-acetaminophen  (NORCO/VICODIN) 5-325 MG tablet Take 1 tablet by mouth every 6 (six) hours as needed. Patient not taking: Reported on 02/11/2023 01/16/23   Terrilee Files, MD      Chinita Greenland MSNA MSN ACNPC-AG Acute Care Nurse Practitioner [facility]

## 2023-02-12 NOTE — Progress Notes (Signed)
  Echocardiogram 2D Echocardiogram has been performed.  Shannon [name] 02/12/2023, 8:36 AM

## 2023-02-12 NOTE — Progress Notes (Signed)
PROGRESS NOTE    Shannon [name]  OZY:[id number] DOB: [birth year] DOA: 02/10/2023 PCP: Richmond Campbell., PA-C   Brief Narrative: Shannon [name] is a 82 y.o. 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 Patient presented secondary to difficulty breathing and was found to have evidence of multifocal pneumonia in addition to respiratory distress and respiratory failure. Empiric antibiotics started, blood cultures obtained and supplemental oxygen/BiPAP provided.   Assessment and Plan:  Multifocal pneumonia Present on admission. No leukocytosis. No procalcitonin available. Blood cultures obtained on admission. Empiric Ceftriaxone and azithromycin started. Complicated by right sided pleural effusion, concerning for probably parapneumonic effusion. MRSA swab negative. -Continue Ceftriaxone/Azithromycin -Continue supportive care -Sputum culture pending  Severe sepsis Present on admission. Patient with tachycardia and tachypnea with associated respiratory distress and failure. Blood cultures obtained on admission and are pending. Empiric antibiotics started on admission. Procalcitonin significantly elevated, but in setting of known cancer. -Continue Ceftriaxone/azithromycin -Follow-up blood cultures  Acute respiratory failure with hypoxia Respiratory distress Secondary to multifocal pneumonia and complicated by right sided pleural effusion and pulmonary edema. Patient with significant respiratory effort on admission requiring BiPAP. Patient has been on BiPAP intermittently. Precedex started on 4/14 for agitation while on BiPAP -Continue supplemental oxygen and/or BiPAP to keep O2 saturations >90% -Wean to room air as able -Wean Precedex per PCCM  Right pleural effusion Small-moderate in size. Possibly contributing to respiratory failure. Likely related to pneumonia and/or underlying  cancer. Thoracentesis ordered and performed with a total of 500 mL of hazy yellow fluid aspirated. Preliminary cell count with an elevated WBC of 1,894, predominantly neutrophils. Light's criteria met on LDH ratio alone. -Follow-up pleural culture/gram stain and pathology  Pulmonary edema Noted on imaging with associated respiratory failure -Watch urine output -Transthoracic Echocardiogram obtained on 4/14 and is pending  Urinary retention Patient required in/out catheterization overnight with 500 mL output. -Serial bladder scans with repeat in/out as needed  Hypokalemia Supplement potassium  Hypothyroidism -Continue Synthroid  Chronic diarrhea Possible related to tube feeds. Stable.  Chronic oropharyngeal dysphagia Underweight Dysphagia related to prior neck surgery. PEG tube in place. Body mass index is 14.46 kg/m. PEG has had some drainage per family. Surrounding skin appears erythematous and irritated likely from leaking. Good positioning on x-ray. -Tube feeds per dietitian  Medullary thyroid cancer Patient follows with Dr. Candise Che as an outpatient. Not currently on therapy.  Breast cancer Lung cancer Noted history.  DVT prophylaxis: Lovenox Code Status:   Code Status: Full Code Family Communication: Daughter-in-law at bedside Disposition Plan: Discharge pending ability to wean oxygen if able, continued treatment of pneumonia   Consultants:  Palliative care medicine  Procedures:  4/13: Right thoracentesis  Antimicrobials: Ceftriaxone Azithromycin    Subjective: Patient without specific concern this morning.  Objective: BP 112/68   Pulse 94   Temp 98.7 F (37.1 C) (Axillary)   Resp 20   Ht 5\' 4"  (1.626 m)   Wt 38.2 kg   SpO2 94%   BMI 14.46 kg/m   Examination:  General exam: Appears calm and comfortable Respiratory system: Mildly coarse breath sounds but otherwise good air movement and normal respiratory effort on BiPAP Cardiovascular system: S1 &  S2 heard, RRR. No murmurs, rubs, gallops or clicks. Gastrointestinal system: Abdomen is nondistended, soft and nontender. Normal bowel sounds heard. Central nervous system: Alert and oriented. No focal neurological deficits. Musculoskeletal: No edema. No calf tenderness Skin: No cyanosis.  No rashes Psychiatry: Judgement and insight appear normal. Mood & affect appropriate.    Data Reviewed: I have personally reviewed following labs and imaging studies  CBC Lab Results  Component Value Date   WBC 12.1 (H) 02/12/2023   RBC 4.74 02/12/2023   HGB 12.3 02/12/2023   HCT 39.4 02/12/2023   MCV 83.1 02/12/2023   MCH 25.9 (L) 02/12/2023   PLT 303 02/12/2023   MCHC 31.2 02/12/2023   RDW 17.0 (H) 02/12/2023   LYMPHSABS 0.2 (L) 02/10/2023   MONOABS 1.1 (H) 02/10/2023   EOSABS 0.0 02/10/2023   BASOSABS 0.1 02/10/2023     Last metabolic panel Lab Results  Component Value Date   NA 141 02/12/2023   K 3.3 (L) 02/12/2023   CL 99 02/12/2023   CO2 30 02/12/2023   BUN 26 (H) 02/12/2023   CREATININE 0.54 02/12/2023   GLUCOSE 125 (H) 02/12/2023   GFRNONAA >60 02/12/2023   GFRAA >60 06/22/2020   CALCIUM 8.3 (L) 02/12/2023   PHOS 2.9 02/12/2023   PROT 6.3 (L) 02/12/2023   ALBUMIN 2.5 (L) 02/12/2023   BILITOT 0.6 02/12/2023   ALKPHOS 111 02/12/2023   AST 25 02/12/2023   ALT 27 02/12/2023   ANIONGAP 12 02/12/2023    GFR: Estimated Creatinine Clearance: 32.7 mL/min (by C-G formula based on SCr of 0.54 mg/dL).  Recent Results (from the past 240 hour(s))  SARS Coronavirus 2 by RT PCR (hospital order, performed in [facility] hospital lab) *cepheid single result test* Anterior Nasal Swab     Status: None   Collection Time: 02/10/23  1:17 PM   Specimen: Anterior Nasal Swab  Result Value Ref Range Status   SARS Coronavirus 2 by RT PCR NEGATIVE NEGATIVE Final    Comment: (NOTE) SARS-CoV-2 target nucleic acids are NOT DETECTED.  The SARS-CoV-2 RNA is generally detectable in upper and  lower respiratory specimens during the acute phase of infection. The lowest concentration of SARS-CoV-2 viral copies this assay can detect is 250 copies / mL. A negative result does not preclude SARS-CoV-2 infection and should not be used as the sole basis for treatment or other patient management decisions.  A negative result may occur with improper specimen collection / handling, submission of specimen other than nasopharyngeal swab, presence of viral mutation(s) within the areas targeted by this assay, and inadequate number of viral copies (<250 copies / mL). A negative result must be combined with clinical observations, patient history, and epidemiological information.  Fact Sheet for Patients:   RoadLapTop.co.za  Fact Sheet for Healthcare Providers: http://kim-miller.com/  This test is not yet approved or  cleared by the [place] FDA and has been authorized for detection and/or diagnosis of SARS-CoV-2 by FDA under an Emergency Use Authorization (EUA).  This EUA will remain in effect (meaning this test can be used) for the duration of the COVID-19 declaration under Section 564(b)(1) of the Act, 21 U.S.C. section 360bbb-3(b)(1), unless the authorization is terminated or revoked sooner.  Performed at [facility], [address] [place], [place] [address]   Blood Culture (routine x 2)     Status: None (Preliminary result)   Collection Time: 02/10/23  1:17 PM   Specimen: BLOOD  Result Value Ref Range Status   Specimen Description   Final    BLOOD RIGHT ANTECUBITAL Performed at [facility], [address], [place], [place] [address]    Special Requests   Final    BOTTLES DRAWN AEROBIC AND ANAEROBIC Blood  Culture adequate volume Performed at [facility], [address] [place], [place] [address]    Culture   Final    NO GROWTH < 24 HOURS Performed at [facility], [address], [place], [place] [address]    Report Status PENDING  Incomplete  Body fluid culture w Gram Stain     Status: None (Preliminary result)   Collection Time: 02/11/23 12:50 PM   Specimen: PATH Cytology Pleural fluid  Result Value Ref Range Status   Specimen Description PLEURAL FLUID RIGHT  Final   Special Requests   Final    SYRINGE Performed at [facility], [address], [place], [place] [address]    Gram Stain PENDING  Incomplete   Culture PENDING  Incomplete   Report Status PENDING  Incomplete  MRSA Next Gen by PCR, Nasal     Status: None   Collection Time: 02/11/23  6:13 PM   Specimen: Nasal Mucosa; Nasal Swab  Result Value Ref Range Status   MRSA by PCR Next Gen NOT DETECTED NOT DETECTED Final    Comment: (NOTE) The GeneXpert MRSA Assay (FDA approved for NASAL specimens only), is one component of a comprehensive MRSA colonization surveillance program. It is not intended to diagnose MRSA infection nor to guide or monitor treatment for MRSA infections. Test performance is not FDA approved in patients less than 59 years old. Performed at [facility], [address] [place], [place] [address]   Respiratory (~20 pathogens) panel by PCR     Status: None   Collection Time: 02/11/23  6:14 PM   Specimen: Nasopharyngeal Swab; Respiratory  Result Value Ref Range Status   Adenovirus NOT DETECTED NOT DETECTED Final   Coronavirus 229E NOT DETECTED NOT DETECTED Final    Comment: (NOTE) The Coronavirus on the Respiratory Panel, DOES NOT test for the novel  Coronavirus (2019 nCoV)    Coronavirus HKU1 NOT DETECTED NOT DETECTED Final   Coronavirus NL63 NOT DETECTED NOT DETECTED Final   Coronavirus OC43 NOT DETECTED NOT DETECTED Final   Metapneumovirus NOT DETECTED NOT DETECTED Final   Rhinovirus / Enterovirus NOT DETECTED NOT DETECTED Final   Influenza A NOT DETECTED NOT DETECTED Final   Influenza B NOT DETECTED NOT DETECTED Final    Parainfluenza Virus 1 NOT DETECTED NOT DETECTED Final   Parainfluenza Virus 2 NOT DETECTED NOT DETECTED Final   Parainfluenza Virus 3 NOT DETECTED NOT DETECTED Final   Parainfluenza Virus 4 NOT DETECTED NOT DETECTED Final   Respiratory Syncytial Virus NOT DETECTED NOT DETECTED Final   Bordetella pertussis NOT DETECTED NOT DETECTED Final   Bordetella Parapertussis NOT DETECTED NOT DETECTED Final   Chlamydophila pneumoniae NOT DETECTED NOT DETECTED Final   Mycoplasma pneumoniae NOT DETECTED NOT DETECTED Final    Comment: Performed at [facility], [address], [place], [place] [address]      Radiology Studies: DG ABDOMEN PEG TUBE LOCATION  Result Date: 02/11/2023 CLINICAL DATA:  PEG tube placement EXAM: ABDOMEN - 1 VIEW COMPARISON:  11/01/2022 FINDINGS: Contrast administered via indwelling gastrostomy tube opacifies the stomach, confirming intragastric placement. Proximal small bowel is also opacified. No extraluminal contrast is visualized. IMPRESSION: Contrast administered via indwelling gastrostomy tube opacifies the stomach, confirming intragastric placement. Electronically Signed   By: Charline Bills M.D.   On: 02/11/2023 20:53   US THORACENTESIS ASP PLEURAL SPACE W/IMG GUIDE  Result Date: 02/11/2023 INDICATION: Shortness of breath. Right-sided pleural effusion. Request for thoracentesis.  EXAM: ULTRASOUND GUIDED RIGHT THORACENTESIS MEDICATIONS: 1% plain lidocaine, 4 mL COMPLICATIONS: None immediate. PROCEDURE: An ultrasound guided thoracentesis was thoroughly discussed with the patient and questions answered. The benefits, risks, alternatives and complications were also discussed. The patient understands and wishes to proceed with the procedure. Written consent was obtained. Ultrasound was performed to localize and mark an adequate pocket of fluid in the right chest. The area was then prepped and draped in the normal sterile fashion. 1% Lidocaine was used for local anesthesia.  Under ultrasound guidance a 6 Fr Safe-T-Centesis catheter was introduced. Thoracentesis was performed. The catheter was removed and a dressing applied. FINDINGS: A total of approximately 500 mL of slightly hazy yellow fluid was removed. Samples were obtained for labs if requested by the clinical team. IMPRESSION: Successful ultrasound guided right thoracentesis yielding 500 mL of pleural fluid. Read by: Brayton El PA-C Electronically Signed   By: Gilmer Mor D.O.   On: 02/11/2023 11:36   DG Chest 1 View  Result Date: 02/11/2023 CLINICAL DATA:  Thoracentesis today EXAM: CHEST  1 VIEW COMPARISON:  Yesterday FINDINGS: Lucency at the right base could be reaerated lung or small loculated pneumothorax. No apical pneumothorax. Bilateral airspace disease and pleural thickening at the apices. IMPRESSION: 1. Improved aeration on the right. Lucency at the lateral right costophrenic sulcus could be aerated lung or tiny loculated pneumothorax. No large or apical pneumothorax. 2. Bilateral airspace disease. Electronically Signed   By: Tiburcio Pea M.D.   On: 02/11/2023 09:58   DG Chest Port 1 View  Result Date: 02/10/2023 CLINICAL DATA:  Shortness of breath EXAM: PORTABLE CHEST 1 VIEW COMPARISON:  Radiograph 06/17/2022 FINDINGS: Unchanged cardiomediastinal silhouette. Mild interstitial opacities. There is a small to moderate size right pleural effusion with adjacent basilar opacities. Additional patchy left basilar and left midlung opacities. Unchanged right apical calcification and associated lung opacity. No evidence of pneumothorax. Diffuse osseous metastatic lesions, best visualized on prior CTs. IMPRESSION: Small to moderate size right pleural effusion. Adjacent right lower lung opacities and additional patchy left basilar and left mid lung opacities, could reflect atelectasis and/or pneumonia. Interstitial prominence could reflect a degree of pulmonary edema. Electronically Signed   By: Caprice Renshaw M.D.    On: 02/10/2023 13:59      LOS: 2 days    Jacquelin Hawking, MD [facility] 02/12/2023, 9:13 AM   If 7PM-7AM, please contact night-coverage [email or web address]

## 2023-02-12 NOTE — Progress Notes (Signed)
Pharmacy Antibiotic Note  Shannon [name] is a 82 y.o. female admitted on 02/10/2023 with pneumonia.  Pharmacy has been consulted for Zosyn dosing.  Plan: Zosyn 3.375g IV q8h (4 hour infusion).  Height: 5\' 4"  (162.6 cm) Weight: 38.2 kg (84 lb 3.5 oz) IBW/kg (Calculated) : 54.7  Temp (24hrs), Avg:98.4 F (36.9 C), Min:97.9 F (36.6 C), Max:98.7 F (37.1 C)  Recent Labs  Lab 02/10/23 1317 02/10/23 1608 02/10/23 1804 02/11/23 0027 02/12/23 0305  WBC 9.2  --  9.6 9.9 12.1*  CREATININE 0.67  --  0.63 0.54 0.54  LATICACIDVEN 1.3 1.0  --   --   --     Estimated Creatinine Clearance: 32.7 mL/min (by C-G formula based on SCr of 0.54 mg/dL).    Allergies  Allergen Reactions   Codeine Other (See Comments)    hyper Other reaction(s): insomnia   4/12 azith >>4/14 4/12 CTX >> 4/14 4/14 ZEI>>   4/12 BCx2: ngtd 4/13 RVP neg 4/13 MRSA neg 4/13 R pleural fluid: ngtd   Thank you for allowing pharmacy to be a part of this patient's care.  Herby Abraham, Pharm.D Use secure chat for questions 02/12/2023 2:09 PM

## 2023-02-12 NOTE — Progress Notes (Signed)
NAME:  Shannon [name], MRN:  [id number], DOB:  [birth year], LOS: 2 ADMISSION DATE:  02/10/2023, CONSULTATION DATE: 4/14 REFERRING MD: Dr. Caleb Popp, CHIEF COMPLAINT: Agitated delirium  History of Present Illness:  82 yo female with pmh hypothyroidism with medullary thyroid cancer, oropharyngeal dysphagia  s/p peg, L breast cancer, lung cancer presented to [facility] with multifocal pneumonia, started on abx req high flow [place] and intermittent NIV. This evening was not tolerating NIV 2/2 agitated delirium precedex was started and as such CCM was consulted for management of gtt.     Pt is drowsy on NIV and unable to get ROS  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   Cancer    Colon polyp    Contact lens/glasses fitting    HAS LENS IMPLANTS   Lung cancer 2015   LYMPHNODE REMOVAL    Medullary carcinoma of thyroid 2011   Osteoporosis    Significant Hospital Events: Including procedures, antibiotic start and stop dates in addition to other pertinent events   Admited 4/12, multifocal pna.  Precedex initiated for agitated delirium    Interim History / Subjective:  On high flow nasal cannula Awake alert interactive, comfortable  Objective   Blood pressure 115/72, pulse 100, temperature 98.7 F (37.1 C), temperature source Axillary, resp. rate (!) 23, height 5\' 4"  (1.626 m), weight 38.2 kg, SpO2 93 %.    FiO2 (%):  [50 %-85 %] 85 %   Intake/Output Summary (Last 24 hours) at 02/12/2023 1048 Last data filed at 02/12/2023 0851 Gross per 24 hour  Intake 524 ml  Output 450 ml  Net 74 ml   Filed Weights   02/10/23 1314 02/11/23 0008  Weight: 43.1 kg 38.2 kg    Examination: General: Elderly, does not appear to be in distress HENT: Moist oral mucosa, on high flow nasal cannula Lungs: Decreased air movement bilaterally Cardiovascular: S1-S2 appreciated Abdomen: Soft, bowel sounds  appreciated Extremities: No clubbing, no edema Neuro: Awake alert oriented x 3 GU:   Chest x-ray reviewed with multilobar infiltrate  Resolved Hospital Problem list     Assessment & Plan:  Acute hypoxemic respiratory failure -Continue oxygen supplementation -On high flow nasal cannula at present -Was on BiPAP but was able to wean off BiPAP  Multifocal pneumonia Community-acquired pneumonia -Continue empiric antibiotics-azithromycin/ceftriaxone -Follow cultures  Sepsis Present on admission -Continue empiric antibiotics  Agitation/respiratory distress Was on Precedex but this has been weaned and now does appear comfortable  Right pleural effusion -Likely related to pneumonia -S/p thoracentesis for 500 cc-tolerated well -Follow fluid analysis  Medullary thyroid carcinoma -Follows with oncology as outpatient Dr. Candise Che  History of breast cancer History of lung cancer    Best Practice (right click and "Reselect all SmartList Selections" daily)   Diet/type: NPO DVT prophylaxis: LMWH GI prophylaxis: N/A Lines: N/A Foley:  N/A Code Status:  full code Last date of multidisciplinary goals of care discussion [discussed with son]  Labs   CBC: Recent Labs  Lab 02/10/23 1317 02/10/23 1804 02/11/23 0027 02/12/23 0305  WBC 9.2 9.6 9.9 12.1*  NEUTROABS 7.7  --   --   --   HGB 12.8 11.6* 11.6* 12.3  HCT 41.3 37.0 38.0 39.4  MCV 83.8 83.3 83.7 83.1  PLT 258 238 268 303    Basic Metabolic Panel: Recent Labs  Lab 02/10/23 1317 02/10/23 1804 02/11/23 0027 02/12/23 0305  NA 137  --  136 141  K 4.4  --  4.1 3.3*  CL 96*  --  98 99  CO2 31  --  28 30  GLUCOSE 139*  --  314* 125*  BUN 35*  --  26* 26*  CREATININE 0.67 0.63 0.54 0.54  CALCIUM 8.6*  --  8.1* 8.3*  MG  --   --   --  2.2  PHOS  --   --   --  2.9   GFR: Estimated Creatinine Clearance: 32.7 mL/min (by C-G formula based on SCr of 0.54 mg/dL). Recent Labs  Lab 02/10/23 1317 02/10/23 1608  02/10/23 1804 02/11/23 0027 02/12/23 0305  PROCALCITON  --   --   --   --  >150.00  WBC 9.2  --  9.6 9.9 12.1*  LATICACIDVEN 1.3 1.0  --   --   --     Liver Function Tests: Recent Labs  Lab 02/12/23 0305  AST 25  ALT 27  ALKPHOS 111  BILITOT 0.6  PROT 6.3*  ALBUMIN 2.5*   No results for input(s): "LIPASE", "AMYLASE" in the last 168 hours. No results for input(s): "AMMONIA" in the last 168 hours.  ABG    Component Value Date/Time   HCO3 31.7 (H) 02/11/2023 0027   O2SAT 95.5 02/11/2023 0027     Coagulation Profile: No results for input(s): "INR", "PROTIME" in the last 168 hours.  Cardiac Enzymes: No results for input(s): "CKTOTAL", "CKMB", "CKMBINDEX", "TROPONINI" in the last 168 hours.  HbA1C: Hgb A1c MFr Bld  Date/Time Value Ref Range Status  02/12/2023 03:05 AM 6.0 (H) 4.8 - 5.6 % Final    Comment:    (NOTE) Pre diabetes:          5.7%-6.4%  Diabetes:              >6.4%  Glycemic control for   <7.0% adults with diabetes     CBG: Recent Labs  Lab 02/11/23 1552 02/11/23 1941 02/12/23 0010 02/12/23 0438 02/12/23 0738  GLUCAP 173* 124* 163* 116* 158*    Review of Systems:   Denies pain or discomfort at present  Past Medical History:  She,  has a past medical history of Anxiety, Breast cancer (1999), Cancer, Colon polyp, Contact lens/glasses fitting, Lung cancer (2015), Medullary carcinoma of thyroid (2011), and Osteoporosis.   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   CATARACT EXTRACTION W/ INTRAOCULAR LENS IMPLANT  2009   BOTH EYES   FEMUR IM NAIL Left 10/23/2017   Procedure: INTRAMEDULLARY (IM) NAIL FEMORAL;  Surgeon: Durene Romans, MD;  Location: [facility] OR;  Service: Orthopedics;  Laterality: Left;   FOOT SURGERY  1070   RIGHT FOOT - PINCHED NERVE   IR  GASTROSTOMY TUBE MOD SED  01/13/2021   IR REPLACE G-TUBE SIMPLE WO FLUORO  03/08/2022   IR REPLACE G-TUBE SIMPLE WO FLUORO  11/01/2022   IR REPLC GASTRO/COLONIC TUBE PERCUT W/FLUORO  05/17/2022   LUNG SURGERY     CALCIFICATIONS, POSITIVE - CANCER LYMPHNODE    MELANOMA EXCISION  2008   CHEST AREA   NECK SURGERY  Nov 09 2011   LYMPHNODES REMOVED 2 POSITIVE    RIGHT COLECTOMY  06/02/2009   THYROIDECTOMY  11/06/2009   TONSILLECTOMY AND ADENOIDECTOMY  Social History:   reports that she quit smoking about 62 years ago. Her smoking use included cigarettes. She has never used smokeless tobacco. She reports that she does not drink alcohol and does not use drugs.   Family History:  Her family history includes Breast cancer in her maternal aunt and sister; Cancer in her brother, cousin, father, paternal aunt, sister, and another family member.   Allergies Allergies  Allergen Reactions   Codeine Other (See Comments)    hyper Other reaction(s): insomnia    The patient is critically ill with multiple organ systems failure and requires high complexity decision making for assessment and support, frequent evaluation and titration of therapies, application of advanced monitoring technologies and extensive interpretation of multiple databases. Critical Care Time devoted to patient care services described in this note independent of APP/resident time (if applicable)  is 30 minutes.   Virl Diamond MD [facility] Personal pager: See Amion If unanswered, please page CCM On-call: #[phone]

## 2023-02-12 NOTE — Consult Note (Signed)
NAME:  Shannon [name], MRN:  [id number], DOB:  Aug 26, 1941, LOS: 2 ADMISSION DATE:  02/10/2023, CONSULTATION DATE:  02/12/23 REFERRING MD:  TRH, CHIEF COMPLAINT:  agitated precedex   History of Present Illness:  82 yo female with pmh hypothyroidism with medullary thyroid cancer, oropharyngeal dysphagia  s/p peg, L breast cancer, lung cancer presented to [facility] with multifocal pneumonia, started on abx req high flow [place] and intermittent NIV. This evening was not tolerating NIV 2/2 agitated delirium precedex was started and as such CCM was consulted for management of gtt.    Pt is drowsy on NIV and unable to get ROS  Pertinent  Medical History  As above  Significant Hospital Events: Including procedures, antibiotic start and stop dates in addition to other pertinent events   Admited 4/12, multifocal pna.  Precedex initiated for agitated delirium  Interim History / Subjective:    Objective   Blood pressure 136/72, pulse (!) 106, temperature 98.1 F (36.7 C), temperature source Axillary, resp. rate (!) 22, height  (1.626 m), weight 38.2 kg, SpO2 93 %.    FiO2 (%):  [50 %] 50 %   Intake/Output Summary (Last 24 hours) at 02/12/2023 0555 Last data filed at 02/12/2023 0439 Gross per 24 hour  Intake 421.53 ml  Output 1000 ml  Net -578.47 ml   Filed Weights   02/10/23 1314 02/11/23 0008  Weight: 43.1 kg 38.2 kg    Examination: General: nad resting with NIV in place and precedex infusing, chronically ill appearing female with cachexia HENT: ncat, NIV in place, eomi Lungs: diminished bilaterally Cardiovascular: irreg irreg Abdomen: soft nt/nd BS diminished Extremities: wasting, no focal deficits Neuro: arousable and follows commands, no focal deficit GU: deferred  Resolved Hospital Problem list     Assessment & Plan:  Agitated delirium -initiated on precedex -will attempt to humidify circuit -hopefully will be able to hold precedex in am -reorientation  Tachycardia Afib  with rvr:  -given lopressor with improvement in hr  Acute hypoxic resp failure:  -on bipap -transition as able Pna:  -cont abx.   Hypokalemia:  -replace  Protein calorie malnutition:  -tf as able.   Best Practice (right click and "Reselect all SmartList Selections" daily)   Diet/type: NPO w/ oral meds DVT prophylaxis: LMWH GI prophylaxis: PPI Lines: N/A Foley:  N/A Code Status:  full code Last date of multidisciplinary goals of care discussion [per primary]  Labs   CBC: Recent Labs  Lab 02/10/23 1317 02/10/23 1804 02/11/23 0027 02/12/23 0305  WBC 9.2 9.6 9.9 12.1*  NEUTROABS 7.7  --   --   --   HGB 12.8 11.6* 11.6* 12.3  HCT 41.3 37.0 38.0 39.4  MCV 83.8 83.3 83.7 83.1  PLT 258 238 268 303    Basic Metabolic Panel: Recent Labs  Lab 02/10/23 1317 02/10/23 1804 02/11/23 0027 02/12/23 0305  NA 137  --  136 141  K 4.4  --  4.1 3.3*  CL 96*  --  98 99  CO2 31  --  28 30  GLUCOSE 139*  --  314* 125*  BUN 35*  --  26* 26*  CREATININE 0.67 0.63 0.54 0.54  CALCIUM 8.6*  --  8.1* 8.3*  MG  --   --   --  2.2  PHOS  --   --   --  2.9   GFR: Estimated Creatinine Clearance: 32.7 mL/min (by C-G formula based on SCr of 0.54 mg/dL). Recent Labs  Lab 02/10/23  1317 02/10/23 1608 02/10/23 1804 02/11/23 0027 02/12/23 0305  PROCALCITON  --   --   --   --  >150.00  WBC 9.2  --  9.6 9.9 12.1*  LATICACIDVEN 1.3 1.0  --   --   --     Liver Function Tests: Recent Labs  Lab 02/12/23 0305  AST 25  ALT 27  ALKPHOS 111  BILITOT 0.6  PROT 6.3*  ALBUMIN 2.5*   No results for input(s): "LIPASE", "AMYLASE" in the last 168 hours. No results for input(s): "AMMONIA" in the last 168 hours.  ABG    Component Value Date/Time   HCO3 31.7 (H) 02/11/2023 0027   O2SAT 95.5 02/11/2023 0027     Coagulation Profile: No results for input(s): "INR", "PROTIME" in the last 168 hours.  Cardiac Enzymes: No results for input(s): "CKTOTAL", "CKMB", "CKMBINDEX", "TROPONINI"  in the last 168 hours.  HbA1C: Hgb A1c MFr Bld  Date/Time Value Ref Range Status  02/12/2023 03:05 AM 6.0 (H) 4.8 - 5.6 % Final    Comment:    (NOTE) Pre diabetes:          5.7%-6.4%  Diabetes:              >6.4%  Glycemic control for   <7.0% adults with diabetes     CBG: Recent Labs  Lab 02/11/23 0537 02/11/23 1552 02/11/23 1941 02/12/23 0010  GLUCAP 153* 173* 124* 163*    Review of Systems:   Unobtainable 2/2 NIV  Past Medical History:  She,  has a past medical history of Anxiety, Breast cancer (1999), Cancer, Colon polyp, Contact lens/glasses fitting, Lung cancer (2015), Medullary carcinoma of thyroid (2011), and Osteoporosis.   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   CATARACT EXTRACTION W/ INTRAOCULAR LENS IMPLANT  2009   BOTH EYES   FEMUR IM NAIL Left 10/23/2017   Procedure: INTRAMEDULLARY (IM) NAIL FEMORAL;  Surgeon: Durene Romans, MD;  Location: [facility] OR;  Service: Orthopedics;  Laterality: Left;   FOOT SURGERY  1070   RIGHT FOOT - PINCHED NERVE   IR GASTROSTOMY TUBE MOD SED  01/13/2021   IR REPLACE G-TUBE SIMPLE WO FLUORO  03/08/2022   IR REPLACE G-TUBE SIMPLE WO FLUORO  11/01/2022   IR REPLC GASTRO/COLONIC TUBE PERCUT W/FLUORO  05/17/2022   LUNG SURGERY     CALCIFICATIONS, POSITIVE - CANCER LYMPHNODE    MELANOMA EXCISION  2008   CHEST AREA   NECK SURGERY  Nov 09 2011   LYMPHNODES REMOVED 2 POSITIVE    RIGHT COLECTOMY  06/02/2009   THYROIDECTOMY  11/06/2009   TONSILLECTOMY AND ADENOIDECTOMY       Social History:   reports that she quit smoking about 62 years ago. Her smoking use included cigarettes. She has never used smokeless tobacco. She reports that she does not drink alcohol and does not use drugs.   Family History:  Her family history includes Breast cancer in  her maternal aunt and sister; Cancer in her brother, cousin, father, paternal aunt, sister, and another family member.   Allergies Allergies  Allergen Reactions   Codeine Other (See Comments)    hyper Other reaction(s): insomnia     Home Medications  Prior to Admission medications   Medication Sig Start Date End Date Taking? Authorizing  Provider  albuterol (VENTOLIN HFA) 108 (90 Base) MCG/ACT inhaler Inhale 1-2 puffs into the lungs every 6 (six) hours as needed for wheezing or shortness of breath. 06/16/22  Yes [provider]  ALPRAZolam Prudy Feeler) 1 MG tablet Take 0.5 tablets (0.5 mg total) by mouth 2 (two) times daily. 10/27/17  Yes Rhetta Mura, MD  bacitracin ophthalmic ointment Place 1 Application into both eyes 3 (three) times daily. 08/15/20  Yes [provider]  Calcium Carbonate-Vit D-Min (CALCIUM 600 + MINERALS PO) Take 1 tablet by mouth daily.   Yes [provider]  cetirizine (ZYRTEC) 10 MG tablet Take 10 mg by mouth daily.   Yes [provider]  Cholecalciferol (VITAMIN D) 1000 UNITS capsule Take 1,000 Units by mouth daily.   Yes [provider]  diphenhydramine-acetaminophen (TYLENOL PM EXTRA STRENGTH) 25-500 MG TABS tablet Take 1 tablet by mouth at bedtime as needed (sleep / pain).   Yes [provider]  Glucosamine-Chondroitin 250-200 MG CAPS Take 1 tablet by mouth daily. Name of table is SCHIFF 12/23/10  Yes [provider]  latanoprost (XALATAN) 0.005 % ophthalmic solution Place 1 drop into both eyes at bedtime. 01/18/23  Yes [provider]  levothyroxine (SYNTHROID) 125 MCG tablet Take 1 tablet by mouth once daily 01/17/23  Yes Shamleffer, Konrad Dolores, MD  loperamide (IMODIUM A-D) 2 MG tablet Take 2 mg by mouth 3 (three) times daily.   Yes [provider]  Multiple Vitamin (MULTIVITAMIN) tablet Take 1 tablet by mouth daily.   Yes [provider]  Nutritional Supplements  (FEEDING SUPPLEMENT, OSMOLITE 1.5 CAL,) LIQD Take 237 mLs by mouth 5 (five) times daily. 10/26/22  Yes [provider]  ondansetron (ZOFRAN) 8 MG tablet Take 1 tablet by mouth every 8 hours as needed for nausea or vomiting. 01/06/23  Yes Johney Maine, MD  pantoprazole (PROTONIX) 40 MG tablet TAKE 1 TABLET BY MOUTH TWICE DAILY BEFORE A MEAL Patient taking differently: Take 40 mg by mouth 2 (two) times daily before a meal. 03/31/22  Yes Kale, Corene Cornea, MD  sucralfate (CARAFATE) 1 g tablet TAKE 1 TABLET BY MOUTH 4 TIMES DAILY WITH MEALS AND AT BEDTIME Patient taking differently: Take 1 g by mouth See admin instructions. TAKE 1 TABLET BY MOUTH 4 TIMES DAILY WITH MEALS AND AT BEDTIME 11/14/22  Yes Johney Maine, MD  vitamin E 180 MG (400 UNITS) capsule Take 400 Units by mouth daily.   Yes [provider]  zoledronic acid (RECLAST) 5 MG/100ML SOLN injection Inject 5 mg into the vein once.   Yes [provider]  cholestyramine (QUESTRAN) 4 g packet DISSOLVE 1 PACKET IN LIQUID OF CHOICE AND DRINK 2 TIMES A DAY Patient not taking: Reported on 02/11/2023 04/09/21   Shamleffer, Konrad Dolores, MD  HYDROcodone-acetaminophen (NORCO/VICODIN) 5-325 MG tablet Take 1 tablet by mouth every 6 (six) hours as needed. Patient not taking: Reported on 02/11/2023 01/16/23   Terrilee Files, MD     Critical care time: 

## 2023-02-12 NOTE — Progress Notes (Signed)
Pt placed on bipap for transport. Pt transported to CT and back to ICU 1239 without complication. Pt placed back on HHFNC.

## 2023-02-12 NOTE — Progress Notes (Addendum)
eLink Physician-Brief Progress Note Patient Name: Shannon [name] DOB: Mar 10, 1941 MRN: [id number]   Date of Service  02/12/2023  HPI/Events of Note  Camera for SVT  Discussed with RN. Multifocal PNA, anxious. HR 150 to 160, narrow complex. Could be sinus tachy to SVT. On nasal o2, cachexia. Was on bipap, but does not like it. No pain.    eICU Interventions  - lopressor 2.5 mg stat Start precedex drip. -asp precautions -to go back on BiPAP     Intervention Category Intermediate Interventions: Arrhythmia - evaluation and management  Ranee Gosselin 02/12/2023, 2:28 AM  02:58  Had a transient a fib RVR confirmed on EKG per RN discussion.  Camera: Back in Sinus, stable VS. Tolerating BiPAP. No hx of prior A fib  Data: 7.41/49/70/31 13 th BMP was ok.   - get stat labs, troponin. - get ECHO, consult  Cardiology eval in AM for new onset a fib.

## 2023-02-12 NOTE — Progress Notes (Signed)
Peg tube dressing changed around 930AM. Leakage noted shortly after administering potassium via tube, orange tinge fluid on both slit gauze. Gauze replaced and Nettey MD notified. CT ordered.   Patient has had more than 7 liquid bowel movements since start of this shift. Patient skin red and macerated surrounding rectum and labia. Nettey MD notified and orders placed for Flexi seal.   Bladder scan showed 300 cc post void. Previously had in and out catheters completed. Foley order placed by MD.

## 2023-02-12 NOTE — Consult Note (Signed)
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  Upon admission, patient found to have multifocal pneumonia.  Patient has been receiving respiratory support on BiPAP/high flow nasal cannula as well as antibiotics for management.  Palliative medicine team consulted to assist with complex medical decision making.  Presented to bedside to see patient.  Patient was actively being cleaned so able to speak with patient's son outside of room. Introduced myself and the role of the palliative medicine team to patient's son, Casimiro Needle.  Casimiro Needle easily able to discuss medical care as he just retired from being a paramedic for 42 years.  He is able to update me on patient's medical journey and that she has been dealing with this diagnosis of thyroid cancer for many years.  He discussed patient's difficulties with secretions and possibility that this multifocal pneumonia was from aspiration though difficult to tell.  He also noted that patient's esophagus is tight and due to multiple medical interventions for thyroid cancer though providers have not wanted to going into stretch as could cause more harm than benefit.  Acknowledged this and thanked Casimiro Needle for the information. Casimiro Needle able to describe that patient lived independently at home.  He noted that he and his wife live close by and would assist patient with tube feeds and medication management. Also able to discuss if  patient has completed ACP documentation discussing healthcare power of attorney and wishes for medical care.  Casimiro Needle noted that he has been trying to encourage patient to do this though she has not yet.  Noted chaplain could assist with this documentation while patient was in hospital.  He would appreciate that.  Patient is not married.  Patient has 2 children Casimiro Needle and his brother Molly Maduro.  He is will be next of kin for patient to make medical decisions. Casimiro Needle also able to discuss that he knows patient is currently full code.  He has been discussing this with patient what this would mean.  Casimiro Needle notes patient's focus has been to "treat the treatable" knowing she has multiple chronic medical illnesses.  Acknowledged these wishes.  Also discussed possibility for home palliative medicine referral to continue important discussions moving forward about patient's healthcare.  Also to assist with symptom management over time.  Able to discuss care with patient shortly after she was cleaned. Patient laying comfortably in bed and easily able to participate in conversation.  Able to introduce myself and the role of the palliative medicine team.  Multiple family members present at bedside including patient's son Casimiro Needle.  Patient denied any symptoms of concern at this time.  Patient feels grateful she is having a better day that she does not have any pain.  Patient agreed she needs to complete advance care planning documentation.  Noted would have chaplain stop by tomorrow to assist with this.  Patient hoping she can improve and get out of the hospital soon.  All questions answered at that time.  Noted palliative medicine team will continue  to follow along.  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   Cancer    Colon polyp    Contact lens/glasses fitting    HAS LENS IMPLANTS   Lung cancer 2015   LYMPHNODE REMOVAL    Medullary carcinoma of thyroid 2011   Osteoporosis    Social History   Socioeconomic History   Marital status: Married    Spouse name: Not on file   Number of children: Not on file   Years of education: Not on file   Highest education level: Not on file  Occupational History   Not on file  Tobacco Use   Smoking status: Former    Types: Cigarettes    Quit date: 08/10/1960    Years since quitting: 62.5   Smokeless tobacco: Never  Vaping Use   Vaping Use: Never used  Substance and Sexual Activity   Alcohol use: No    Alcohol/week: 0.0 standard drinks of alcohol   Drug use: No   Sexual activity: Never  Other Topics Concern   Not on file  Social History Narrative   Not on file   Social Determinants of Health   Financial Resource Strain: Not on file  Food Insecurity: Not on file  Transportation Needs: Not on file  Physical Activity: Not on file  Stress: Not on file  Social Connections: Not on file   Family History  Problem Relation Age of Onset   Cancer Father        throat/ bone   Breast cancer Sister    Cancer Sister        MEDULAR CANCER   Cancer Brother        prostate   Cancer Other        MEDULAR CANCER   Breast cancer Maternal Aunt    Cancer Paternal Aunt        pancreatic    Cancer Cousin        ON MOTHER'S SIDE- LIVER CANCER   Scheduled Meds:  azithromycin  500 mg Per Tube Daily   Chlorhexidine Gluconate Cloth  6 each Topical Daily   enoxaparin (LOVENOX) injection  30 mg Subcutaneous Q24H   feeding supplement (OSMOLITE 1.5 CAL)  1,000 mL Per Tube Q24H   insulin aspart  0-15 Units Subcutaneous Q4H   [START ON 02/13/2023] levothyroxine  125 mcg Per Tube Q0600   loratadine  10 mg Per Tube Daily   mouth rinse   15 mL Mouth Rinse 4 times per day   pantoprazole (PROTONIX) IV  40 mg Intravenous Q12H   potassium chloride  40 mEq Per Tube Once   simethicone  80 mg Per Tube TID   sucralfate  1 g Per Tube TID WC & HS   Continuous Infusions:  cefTRIAXone (ROCEPHIN)  IV Stopped (02/11/23 1405)   dexmedetomidine (PRECEDEX) IV infusion 0.4 mcg/kg/hr (02/12/23 0851)   PRN Meds:.acetaminophen **OR** acetaminophen, guaiFENesin-dextromethorphan, HYDROcodone-acetaminophen, levalbuterol, liver oil-zinc oxide, metoprolol tartrate, ondansetron **OR** ondansetron (ZOFRAN) IV, mouth rinse, traZODone Allergies  Allergen Reactions   Codeine Other (See Comments)    hyper Other reaction(s): insomnia   CBC:    Component Value Date/Time   WBC 12.1 (H) 02/12/2023 1610  HGB 12.3 02/12/2023 0305   HGB 13.4 11/14/2022 1114   HCT 39.4 02/12/2023 0305   PLT 303 02/12/2023 0305   PLT 239 11/14/2022 1114   MCV 83.1 02/12/2023 0305   NEUTROABS 7.7 02/10/2023 1317   LYMPHSABS 0.2 (L) 02/10/2023 1317   MONOABS 1.1 (H) 02/10/2023 1317   EOSABS 0.0 02/10/2023 1317   BASOSABS 0.1 02/10/2023 1317   Comprehensive Metabolic Panel:    Component Value Date/Time   NA 141 02/12/2023 0305   K 3.3 (L) 02/12/2023 0305   CL 99 02/12/2023 0305   CO2 30 02/12/2023 0305   BUN 26 (H) 02/12/2023 0305   CREATININE 0.54 02/12/2023 0305   CREATININE 0.51 11/14/2022 1114   CREATININE 0.78 02/17/2017 1104   GLUCOSE 125 (H) 02/12/2023 0305   CALCIUM 8.3 (L) 02/12/2023 0305   CALCIUM 10.0 08/17/2012 0940   AST 25 02/12/2023 0305   AST 22 11/14/2022 1114   ALT 27 02/12/2023 0305   ALT 23 11/14/2022 1114   ALKPHOS 111 02/12/2023 0305   BILITOT 0.6 02/12/2023 0305   BILITOT 0.6 11/14/2022 1114   PROT 6.3 (L) 02/12/2023 0305   ALBUMIN 2.5 (L) 02/12/2023 0305    Physical Exam: Vital Signs: BP 112/68   Pulse 94   Temp 98.7 F (37.1 C) (Axillary)   Resp 20   Ht 5\' 4"  (1.626 m)   Wt 38.2 kg   SpO2 94%   BMI 14.46 kg/m  SpO2:  SpO2: 94 % O2 Device: O2 Device: High Flow Nasal Cannula O2 Flow Rate: O2 Flow Rate (L/min): 30 L/min Intake/output summary:  Intake/Output Summary (Last 24 hours) at 02/12/2023 1001 Last data filed at 02/12/2023 0851 Gross per 24 hour  Intake 524 ml  Output 450 ml  Net 74 ml   LBM: Last BM Date : 02/12/23 Baseline Weight: Weight: 43.1 kg Most recent weight: Weight: 38.2 kg  General: NAD, alert, chronically ill-appearing, frail Eyes: No drainage noted HENT: On HFNC support Cardiovascular: RRR Respiratory:  On HFNC support Abdomen: not distended Extremities: Moving all appropriately Skin: no rashes or lesions on visible skin Neuro: A&Ox4, following commands easily Psych: appropriately answers all questions          Palliative Performance Scale: 70%              Additional Data Reviewed: Recent Labs    02/11/23 0027 02/12/23 0305  WBC 9.9 12.1*  HGB 11.6* 12.3  PLT 268 303  NA 136 141  BUN 26* 26*  CREATININE 0.54 0.54    Imaging: DG ABDOMEN PEG TUBE LOCATION CLINICAL DATA:  PEG tube placement  EXAM: ABDOMEN - 1 VIEW  COMPARISON:  11/01/2022  FINDINGS: Contrast administered via indwelling gastrostomy tube opacifies the stomach, confirming intragastric placement.  Proximal small bowel is also opacified.  No extraluminal contrast is visualized.  IMPRESSION: Contrast administered via indwelling gastrostomy tube opacifies the stomach, confirming intragastric placement.  Electronically Signed   By: Charline Bills M.D.   On: 02/11/2023 20:53 US THORACENTESIS ASP PLEURAL SPACE W/IMG GUIDE INDICATION: Shortness of breath. Right-sided pleural effusion. Request for thoracentesis.  EXAM: ULTRASOUND GUIDED RIGHT THORACENTESIS  MEDICATIONS: 1% plain lidocaine, 4 mL  COMPLICATIONS: None immediate.  PROCEDURE: An ultrasound guided thoracentesis was thoroughly discussed with the patient and questions answered. The benefits, risks, alternatives and  complications were also discussed. The patient understands and wishes to proceed with the procedure. Written consent was obtained.  Ultrasound was performed to localize and mark an adequate pocket of fluid  in the right chest. The area was then prepped and draped in the normal sterile fashion. 1% Lidocaine was used for local anesthesia. Under ultrasound guidance a 6 Fr Safe-T-Centesis catheter was introduced. Thoracentesis was performed. The catheter was removed and a dressing applied.  FINDINGS: A total of approximately 500 mL of slightly hazy yellow fluid was removed. Samples were obtained for labs if requested by the clinical team.  IMPRESSION: Successful ultrasound guided right thoracentesis yielding 500 mL of pleural fluid.  Read by: Brayton El PA-C  Electronically Signed   By: Gilmer Mor D.O.   On: 02/11/2023 11:36 DG Chest 1 View CLINICAL DATA:  Thoracentesis today  EXAM: CHEST  1 VIEW  COMPARISON:  Yesterday  FINDINGS: Lucency at the right base could be reaerated lung or small loculated pneumothorax. No apical pneumothorax. Bilateral airspace disease and pleural thickening at the apices.  IMPRESSION: 1. Improved aeration on the right. Lucency at the lateral right costophrenic sulcus could be aerated lung or tiny loculated pneumothorax. No large or apical pneumothorax. 2. Bilateral airspace disease.  Electronically Signed   By: Tiburcio Pea M.D.   On: 02/11/2023 09:58    I personally reviewed recent imaging.   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  Upon admission, patient found to have  multifocal pneumonia.  Patient has been receiving respiratory support on BiPAP/high flow nasal cannula as well as antibiotics for management.  Palliative medicine team consulted to assist with complex medical decision making.  # Complex medical decision making/goals of care  -Discussed care with patient and patient's son, Casimiro Needle today. Hoping to "treat the treatable" with patient's known chronic medical illnesses.    -Recommend referral to home palliative medicine team for further support. Will discuss with patient tomorrow.   -Recommended completion of ACHP/HCPOA documentation to patient. Will consult chaplain to assist with completion on 4/15 at 1PM (when family can be present too). Patient has two sons who are NOK, Casimiro Needle and Molly Maduro. Casimiro Needle is a recently retired paramedic for 42 years and so is easily able to discuss medical concerns.   -  Code Status: Full Code    # Symptom management  -Denies any symptoms of concern currently.   # Psycho-social/Spiritual Support:  - Support System: sons, daughters-in-law - Desire for further Chaplain support:yes, ACP completion   # Discharge Planning:  Home with Palliative Services (though awaiting PT input to determine home health vs rehab)  Thank you for allowing the palliative care team to participate in the care [name].  Alvester Morin, DO Palliative Care Provider PMT # [phone]  If patient remains symptomatic despite maximum doses, please call PMT at [phone] between 0700 and 1900. Outside of these hours, please call attending, as PMT does not have night coverage.  This provider spent a total of 80 minutes providing patient's care.  Includes review of EMR, discussing care with other staff members involved in patient's medical care, obtaining relevant history and information from patient and/or patient's family, and personal review of imaging and lab work. Greater than 50% of the time was spent counseling and coordinating care related  to the above assessment and plan.    *Please note that this is  a verbal dictation therefore any spelling or grammatical errors are due to the "[employer]" system interpretation.

## 2023-02-13 ENCOUNTER — Inpatient Hospital Stay (HOSPITAL_COMMUNITY): Payer: Medicare Other

## 2023-02-13 DIAGNOSIS — J9601 Acute respiratory failure with hypoxia: Secondary | ICD-10-CM

## 2023-02-13 DIAGNOSIS — Z515 Encounter for palliative care: Secondary | ICD-10-CM | POA: Diagnosis not present

## 2023-02-13 DIAGNOSIS — R0603 Acute respiratory distress: Secondary | ICD-10-CM | POA: Diagnosis not present

## 2023-02-13 DIAGNOSIS — J189 Pneumonia, unspecified organism: Secondary | ICD-10-CM | POA: Diagnosis not present

## 2023-02-13 DIAGNOSIS — J918 Pleural effusion in other conditions classified elsewhere: Secondary | ICD-10-CM | POA: Diagnosis not present

## 2023-02-13 DIAGNOSIS — J90 Pleural effusion, not elsewhere classified: Secondary | ICD-10-CM | POA: Diagnosis not present

## 2023-02-13 DIAGNOSIS — R410 Disorientation, unspecified: Secondary | ICD-10-CM | POA: Diagnosis not present

## 2023-02-13 DIAGNOSIS — R4589 Other symptoms and signs involving emotional state: Secondary | ICD-10-CM

## 2023-02-13 HISTORY — PX: IR REPLC DUODEN/JEJUNO TUBE PERCUT W/FLUORO: IMG2334

## 2023-02-13 LAB — GLUCOSE, CAPILLARY
Glucose-Capillary: 114 mg/dL — ABNORMAL HIGH (ref 70–99)
Glucose-Capillary: 115 mg/dL — ABNORMAL HIGH (ref 70–99)
Glucose-Capillary: 127 mg/dL — ABNORMAL HIGH (ref 70–99)
Glucose-Capillary: 128 mg/dL — ABNORMAL HIGH (ref 70–99)
Glucose-Capillary: 103 mg/dL — ABNORMAL HIGH (ref 70–99)

## 2023-02-13 LAB — CBC
HCT: 37.6 % (ref 36.0–46.0)
Hemoglobin: 11.5 g/dL — ABNORMAL LOW (ref 12.0–15.0)
MCH: 25.7 pg — ABNORMAL LOW (ref 26.0–34.0)
MCHC: 30.6 g/dL (ref 30.0–36.0)
MCV: 84.1 fL (ref 80.0–100.0)
Platelets: 279 10*3/uL (ref 150–400)
RBC: 4.47 MIL/uL (ref 3.87–5.11)
RDW: 17 % — ABNORMAL HIGH (ref 11.5–15.5)
WBC: 10.7 10*3/uL — ABNORMAL HIGH (ref 4.0–10.5)
nRBC: 0 % (ref 0.0–0.2)

## 2023-02-13 LAB — BASIC METABOLIC PANEL
Anion gap: 9 (ref 5–15)
BUN: 19 mg/dL (ref 8–23)
CO2: 28 mmol/L (ref 22–32)
Calcium: 7.7 mg/dL — ABNORMAL LOW (ref 8.9–10.3)
Chloride: 102 mmol/L (ref 98–111)
Creatinine, Ser: 0.59 mg/dL (ref 0.44–1.00)
GFR, Estimated: >60 mL/min (ref >60)
Glucose, Bld: 95 mg/dL (ref 70–99)
Potassium: 3.3 mmol/L — ABNORMAL LOW (ref 3.5–5.1)
Sodium: 139 mmol/L (ref 135–145)

## 2023-02-13 LAB — CULTURE, BLOOD (ROUTINE X 2)

## 2023-02-13 LAB — CALCIUM, IONIZED: Calcium, Ionized, Serum: 4.8 mg/dL (ref 4.5–5.6)

## 2023-02-13 LAB — BODY FLUID CULTURE W GRAM STAIN: Culture: NO GROWTH

## 2023-02-13 MED ORDER — LIDOCAINE VISCOUS HCL 2 % MT SOLN
15.0000 mL | Freq: Once | OROMUCOSAL | Status: AC
  Administered 2023-02-13: 15 mL via ORAL

## 2023-02-13 MED ORDER — POTASSIUM CHLORIDE 10 MEQ/100ML IV SOLN
10.0000 meq | INTRAVENOUS | Status: AC
  Administered 2023-02-13 (×4): 10 meq via INTRAVENOUS
  Filled 2023-02-13 (×4): qty 100

## 2023-02-13 MED ORDER — LIDOCAINE VISCOUS HCL 2 % MT SOLN
OROMUCOSAL | Status: AC
  Filled 2023-02-13: qty 15

## 2023-02-13 MED ORDER — DEXMEDETOMIDINE HCL IN NACL 400 MCG/100ML IV SOLN
0.0000 ug/kg/h | INTRAVENOUS | Status: DC
  Administered 2023-02-13: 0.5 ug/kg/h via INTRAVENOUS
  Filled 2023-02-13: qty 100

## 2023-02-13 MED ORDER — IOHEXOL 300 MG/ML  SOLN
50.0000 mL | Freq: Once | INTRAMUSCULAR | Status: AC | PRN
  Administered 2023-02-13: 10 mL

## 2023-02-13 NOTE — Progress Notes (Signed)
PROGRESS NOTE    Shannon [name]  ZOX:[id number] DOB: 05-25-1941 DOA: 02/10/2023 PCP: Richmond Campbell., PA-C   Brief Narrative: Shannon [name] is a 82 y.o. 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 Patient presented secondary to difficulty breathing and was found to have evidence of multifocal pneumonia in addition to respiratory distress and respiratory failure. Empiric antibiotics started, blood cultures obtained and supplemental oxygen/BiPAP provided.   Assessment and Plan:  Multifocal pneumonia Aspiration pneumonia Present on admission. No leukocytosis. No procalcitonin available. Blood cultures obtained on admission. Empiric Ceftriaxone and azithromycin started. Complicated by right sided pleural effusion, concerning for probably parapneumonic effusion. MRSA swab negative. Ceftriaxone/Azithromycin switched to Zosyn IV -Continue Zosyn IV -Continue supportive care -Sputum culture pending  Severe sepsis Present on admission. Patient with tachycardia and tachypnea with associated respiratory distress and failure. Blood cultures obtained on admission and are no growth to date. Empiric antibiotics started on admission. Procalcitonin significantly elevated, but in setting of known cancer. -Continue Zosyn IV -Follow-up blood cultures  Acute respiratory failure with hypoxia Respiratory distress Secondary to multifocal pneumonia and complicated by right sided pleural effusion and pulmonary edema. Patient with significant respiratory effort on admission requiring BiPAP. Patient has been on BiPAP intermittently. Precedex started on 4/14 for agitation while on BiPAP; BiPAP now discontinued and patient transitioned to heated high flow nasal canula, currently requiring 30 L/min at 90% FiO2 -Continue supplemental oxygen and/or BiPAP to keep O2 saturations >90% -Wean to room air as  able -Wean Precedex per PCCM  Right pleural effusion Small-moderate in size. Possibly contributing to respiratory failure. Likely related to pneumonia and/or underlying cancer. Thoracentesis ordered and performed with a total of 500 mL of hazy yellow fluid aspirated. Preliminary cell count with an elevated WBC of 1,894, predominantly neutrophils. Light's criteria met on LDH ratio alone. No organisms noted on fluid culture to date and no organisms on gram stain. -Follow-up pleural fluid culture and pathology  Pulmonary edema Noted on imaging with associated respiratory failure -Watch urine output  Acute combined systolic and diastolic heart failure Transthoracic Echocardiogram obtained on 4/14 and is significant for an LVEF of 45-50% with associated grade 1 diastolic dysfunction and no regional wall motion abnormality. Patient treated with thoracentesis and Lasix with improvement. -Watch for ongoing acute signs/symptoms -Daily weights and strict in/out  Urinary retention Patient required in/out catheterization overnight with 500 mL output. -Serial bladder scans with repeat in/out as needed  Hypokalemia Supplement potassium  Hypothyroidism -Continue Synthroid pending J-tube placement  Chronic diarrhea Possible related to tube feeds. Stable.  Chronic oropharyngeal dysphagia Underweight Dysphagia related to prior neck surgery. PEG tube in place. Body mass index is 14.46 kg/m. PEG has had some drainage per family. Surrounding skin appears erythematous and irritated likely from leaking. Good positioning on x-ray. Evidence of significant reflux noted on CT imaging -IR consult to convert to j-tube -Hold tube feeds pending j-tube  Medullary thyroid cancer Patient follows with Dr. Candise Che as an outpatient. Not currently on therapy.  Breast cancer Lung cancer Noted history.  DVT prophylaxis: Lovenox Code Status:   Code Status: Full Code Family Communication: None at  bedside Disposition Plan: Discharge pending ability to wean oxygen if able, continued treatment of pneumonia   Consultants:  Palliative care medicine PCCM  Procedures:  4/13: Right thoracentesis  Antimicrobials: Ceftriaxone Azithromycin Zosyn IV   Subjective: Patient is feeling better today. Not requiring BiPAP. Feeling more  optimistic.  Objective: BP 123/63   Pulse 100   Temp (!) 97.4 F (36.3 C) (Oral)   Resp (!) 24   Ht 5\' 4"  (1.626 m)   Wt 38.2 kg   SpO2 96%   BMI 14.46 kg/m   Examination:  General exam: Appears calm and comfortable. Chronically ill appearing/frail Respiratory system: Clear to auscultation. Respiratory effort normal. Cardiovascular system: S1 & S2 heard, RRR. Gastrointestinal system: Abdomen is nondistended, soft and nontender. Normal bowel sounds heard. Central nervous system: Alert and oriented. No focal neurological deficits. Musculoskeletal: No calf tenderness Skin: No cyanosis. No rashes Psychiatry: Judgement and insight appear normal. Mood & affect appropriate.    Data Reviewed: I have personally reviewed following labs and imaging studies  CBC Lab Results  Component Value Date   WBC 10.7 (H) 02/13/2023   RBC 4.47 02/13/2023   HGB 11.5 (L) 02/13/2023   HCT 37.6 02/13/2023   MCV 84.1 02/13/2023   MCH 25.7 (L) 02/13/2023   PLT 279 02/13/2023   MCHC 30.6 02/13/2023   RDW 17.0 (H) 02/13/2023   LYMPHSABS 0.2 (L) 02/10/2023   MONOABS 1.1 (H) 02/10/2023   EOSABS 0.0 02/10/2023   BASOSABS 0.1 02/10/2023     Last metabolic panel Lab Results  Component Value Date   NA 139 02/13/2023   K 3.3 (L) 02/13/2023   CL 102 02/13/2023   CO2 28 02/13/2023   BUN 19 02/13/2023   CREATININE 0.59 02/13/2023   GLUCOSE 95 02/13/2023   GFRNONAA >60 02/13/2023   GFRAA >60 06/22/2020   CALCIUM 7.7 (L) 02/13/2023   PHOS 2.9 02/12/2023   PROT 6.3 (L) 02/12/2023   ALBUMIN 2.5 (L) 02/12/2023   BILITOT 0.6 02/12/2023   ALKPHOS 111 02/12/2023    AST 25 02/12/2023   ALT 27 02/12/2023   ANIONGAP 9 02/13/2023    GFR: Estimated Creatinine Clearance: 32.7 mL/min (by C-G formula based on SCr of 0.59 mg/dL).  Recent Results (from the past 240 hour(s))  SARS Coronavirus 2 by RT PCR (hospital order, performed in [facility] hospital lab) *cepheid single result test* Anterior Nasal Swab     Status: None   Collection Time: 02/10/23  1:17 PM   Specimen: Anterior Nasal Swab  Result Value Ref Range Status   SARS Coronavirus 2 by RT PCR NEGATIVE NEGATIVE Final    Comment: (NOTE) SARS-CoV-2 target nucleic acids are NOT DETECTED.  The SARS-CoV-2 RNA is generally detectable in upper and lower respiratory specimens during the acute phase of infection. The lowest concentration of SARS-CoV-2 viral copies this assay can detect is 250 copies / mL. A negative result does not preclude SARS-CoV-2 infection and should not be used as the sole basis for treatment or other patient management decisions.  A negative result may occur with improper specimen collection / handling, submission of specimen other than nasopharyngeal swab, presence of viral mutation(s) within the areas targeted by this assay, and inadequate number of viral copies (<250 copies / mL). A negative result must be combined with clinical observations, patient history, and epidemiological information.  Fact Sheet for Patients:   RoadLapTop.co.za  Fact Sheet for Healthcare Providers: http://kim-miller.com/  This test is not yet approved or  cleared by the [place] FDA and has been authorized for detection and/or diagnosis of SARS-CoV-2 by FDA under an Emergency Use Authorization (EUA).  This EUA will remain in effect (meaning this test can be used) for the duration of the COVID-19 declaration under Section 564(b)(1) of the Act, 21 U.S.C.  section 360bbb-3(b)(1), unless the authorization is terminated or revoked sooner.  Performed  at [facility], [address] [place], [place] [address]   Blood Culture (routine x 2)     Status: None (Preliminary result)   Collection Time: 02/10/23  1:17 PM   Specimen: BLOOD  Result Value Ref Range Status   Specimen Description   Final    BLOOD RIGHT ANTECUBITAL Performed at [facility], [address], [place], [place] [address]    Special Requests   Final    BOTTLES DRAWN AEROBIC AND ANAEROBIC Blood Culture adequate volume Performed at [facility], [address] [place], [place] [address]    Culture   Final    NO GROWTH 3 DAYS Performed at [facility], [address] [place], [place] [address]    Report Status PENDING  Incomplete  Body fluid culture w Gram Stain     Status: None (Preliminary result)   Collection Time: 02/11/23 12:50 PM   Specimen: PATH Cytology Pleural fluid  Result Value Ref Range Status   Specimen Description PLEURAL FLUID RIGHT  Final   Special Requests SYRINGE  Final   Gram Stain NO ORGANISMS SEEN  Final   Culture   Final    NO GROWTH < 12 HOURS Performed at [facility], [address], [place], [place] [address]    Report Status PENDING  Incomplete  MRSA Next Gen by PCR, Nasal     Status: None   Collection Time: 02/11/23  6:13 PM   Specimen: Nasal Mucosa; Nasal Swab  Result Value Ref Range Status   MRSA by PCR Next Gen NOT DETECTED NOT DETECTED Final    Comment: (NOTE) The GeneXpert MRSA Assay (FDA approved for NASAL specimens only), is one component of a comprehensive MRSA colonization surveillance program. It is not intended to diagnose MRSA infection nor to guide or monitor treatment for MRSA infections. Test performance is not FDA approved in patients less than 77 years old. Performed at [facility], [address], [place], [place] [address]   Respiratory (~20 pathogens) panel by PCR     Status: None   Collection Time: 02/11/23  6:14 PM    Specimen: Nasopharyngeal Swab; Respiratory  Result Value Ref Range Status   Adenovirus NOT DETECTED NOT DETECTED Final   Coronavirus 229E NOT DETECTED NOT DETECTED Final    Comment: (NOTE) The Coronavirus on the Respiratory Panel, DOES NOT test for the novel  Coronavirus (2019 nCoV)    Coronavirus HKU1 NOT DETECTED NOT DETECTED Final   Coronavirus NL63 NOT DETECTED NOT DETECTED Final   Coronavirus OC43 NOT DETECTED NOT DETECTED Final   Metapneumovirus NOT DETECTED NOT DETECTED Final   Rhinovirus / Enterovirus NOT DETECTED NOT DETECTED Final   Influenza A NOT DETECTED NOT DETECTED Final   Influenza B NOT DETECTED NOT DETECTED Final   Parainfluenza Virus 1 NOT DETECTED NOT DETECTED Final   Parainfluenza Virus 2 NOT DETECTED NOT DETECTED Final   Parainfluenza Virus 3 NOT DETECTED NOT DETECTED Final   Parainfluenza Virus 4 NOT DETECTED NOT DETECTED Final   Respiratory Syncytial Virus NOT DETECTED NOT DETECTED Final   Bordetella pertussis NOT DETECTED NOT DETECTED Final   Bordetella Parapertussis NOT DETECTED NOT DETECTED Final   Chlamydophila pneumoniae NOT DETECTED NOT DETECTED Final   Mycoplasma pneumoniae NOT DETECTED NOT DETECTED Final    Comment: Performed at [facility], [address] [place], [place]  [address]      Radiology Studies: CT Angio Chest Pulmonary Embolism (PE) W or WO Contrast  Result Date: 02/12/2023 CLINICAL DATA:  Acute hypoxic respiratory failure. Multifocal pneumonia. Bowel obstruction suspected. EXAM: CT ANGIOGRAPHY CHEST CT ABDOMEN AND PELVIS WITH CONTRAST TECHNIQUE: Multidetector CT imaging of the chest was performed using the standard protocol during bolus administration of intravenous contrast. Multiplanar CT image reconstructions and MIPs were obtained to evaluate the vascular anatomy. Multidetector CT imaging of the abdomen and pelvis was performed using the standard protocol during bolus administration of intravenous contrast. RADIATION DOSE  REDUCTION: This exam was performed according to the departmental dose-optimization program which includes automated exposure control, adjustment of the mA and/or kV according to patient size and/or use of iterative reconstruction technique. CONTRAST:  OMNIPAQUE IOHEXOL 350 MG/ML SOLN COMPARISON:  Chest abdomen and pelvis CT dated 06/18/2019. Chest CT dated 06/22/2020. CT thoracic and lumbar spine dated 01/16/2023. PET-CT dated 04/06/2022. FINDINGS: Cardiovascular: There is no pulmonary embolism identified within the main, lobar or segmental pulmonary arteries of either lung. No thoracic aortic aneurysm or evidence of aortic dissection. No clinically significant pericardial effusion. Mediastinum/Nodes: No mass or enlarged lymph nodes are seen within the mediastinum or perihilar regions. Esophagus appears to be filled with fluid/debris to the level of the mid trachea. Trachea and central bronchi are unremarkable. Lungs/Pleura: Extensive biapical pleural-parenchymal scarring with associated architectural distortion, not significantly changed compared to the chest CT of 06/22/2020. Bibasilar consolidations, RIGHT greater than LEFT. Smaller patchy consolidations within the lingula and RIGHT middle lobe. Focal ground-glass opacity within the anterior aspect of the RIGHT upper lobe. Small bilateral pleural effusions. Musculoskeletal: Widespread osseous metastases, as previously described. Review of the MIP images confirms the above findings. CT ABDOMEN and PELVIS FINDINGS Hepatobiliary: Stable hypodense lesions within the LEFT liver lobe, consistent with benign cysts. New small hypodense lesion within the upper aspect of the RIGHT liver lobe (series 12, image 16), cyst versus early metastasis. Gallbladder appears normal. No significant bile duct dilatation seen. Pancreas: Unremarkable. No pancreatic ductal dilatation or surrounding inflammatory changes. Spleen: Normal in size without focal abnormality.  Adrenals/Urinary Tract: Adrenal glands are unremarkable. Kidneys are unremarkable without suspicious mass, stone or hydronephrosis. No ureteral or bladder calculi identified. Bladder decompressed by Foley catheter. Stomach/Bowel: No dilated large or small bowel loops. Gastrostomy tube appears appropriately positioned within the body of the stomach. Vascular/Lymphatic: No abdominal aortic aneurysm. No acute-appearing vascular abnormality. No enlarged lymph nodes are identified in the abdomen or pelvis. Reproductive: No adnexal mass or free fluid seen. Other: No free fluid or abscess collection. No free intraperitoneal air. Musculoskeletal: Widespread osseous metastases, as previously described. Review of the MIP images confirms the above findings. IMPRESSION: 1. Bibasilar consolidations, RIGHT greater than LEFT, compatible with multifocal pneumonia and/or aspiration. Favor aspiration as the esophagus is filled with fluid/debris to the level of the upper mediastinum predisposing the patient to potential aspirations. 2. Small bilateral pleural effusions. 3. Widespread osseous metastases, as previously described. 4. New small hypodense lesion within the upper aspect of the RIGHT liver lobe, additional cyst versus early metastasis (2 benign cysts within the LEFT liver lobe). Recommend nonemergent liver MRI for further characterization if clinically needed. 5. No bowel obstruction. No evidence of acute solid organ abnormality within the abdomen or pelvis. No renal or ureteral calculi. 6. Gastrostomy tube appears appropriately positioned within the body of the stomach. Electronically Signed   By: Bary Richard M.D.   On: 02/12/2023 13:25   CT ABDOMEN PELVIS  W CONTRAST  Result Date: 02/12/2023 CLINICAL DATA:  Acute hypoxic respiratory failure. Multifocal pneumonia. Bowel obstruction suspected. EXAM: CT ANGIOGRAPHY CHEST CT ABDOMEN AND PELVIS WITH CONTRAST TECHNIQUE: Multidetector CT imaging of the chest was performed  using the standard protocol during bolus administration of intravenous contrast. Multiplanar CT image reconstructions and MIPs were obtained to evaluate the vascular anatomy. Multidetector CT imaging of the abdomen and pelvis was performed using the standard protocol during bolus administration of intravenous contrast. RADIATION DOSE REDUCTION: This exam was performed according to the departmental dose-optimization program which includes automated exposure control, adjustment of the mA and/or kV according to patient size and/or use of iterative reconstruction technique. CONTRAST:  OMNIPAQUE IOHEXOL 350 MG/ML SOLN COMPARISON:  Chest abdomen and pelvis CT dated 06/18/2019. Chest CT dated 06/22/2020. CT thoracic and lumbar spine dated 01/16/2023. PET-CT dated 04/06/2022. FINDINGS: Cardiovascular: There is no pulmonary embolism identified within the main, lobar or segmental pulmonary arteries of either lung. No thoracic aortic aneurysm or evidence of aortic dissection. No clinically significant pericardial effusion. Mediastinum/Nodes: No mass or enlarged lymph nodes are seen within the mediastinum or perihilar regions. Esophagus appears to be filled with fluid/debris to the level of the mid trachea. Trachea and central bronchi are unremarkable. Lungs/Pleura: Extensive biapical pleural-parenchymal scarring with associated architectural distortion, not significantly changed compared to the chest CT of 06/22/2020. Bibasilar consolidations, RIGHT greater than LEFT. Smaller patchy consolidations within the lingula and RIGHT middle lobe. Focal ground-glass opacity within the anterior aspect of the RIGHT upper lobe. Small bilateral pleural effusions. Musculoskeletal: Widespread osseous metastases, as previously described. Review of the MIP images confirms the above findings. CT ABDOMEN and PELVIS FINDINGS Hepatobiliary: Stable hypodense lesions within the LEFT liver lobe, consistent with benign cysts. New small hypodense  lesion within the upper aspect of the RIGHT liver lobe (series 12, image 16), cyst versus early metastasis. Gallbladder appears normal. No significant bile duct dilatation seen. Pancreas: Unremarkable. No pancreatic ductal dilatation or surrounding inflammatory changes. Spleen: Normal in size without focal abnormality. Adrenals/Urinary Tract: Adrenal glands are unremarkable. Kidneys are unremarkable without suspicious mass, stone or hydronephrosis. No ureteral or bladder calculi identified. Bladder decompressed by Foley catheter. Stomach/Bowel: No dilated large or small bowel loops. Gastrostomy tube appears appropriately positioned within the body of the stomach. Vascular/Lymphatic: No abdominal aortic aneurysm. No acute-appearing vascular abnormality. No enlarged lymph nodes are identified in the abdomen or pelvis. Reproductive: No adnexal mass or free fluid seen. Other: No free fluid or abscess collection. No free intraperitoneal air. Musculoskeletal: Widespread osseous metastases, as previously described. Review of the MIP images confirms the above findings. IMPRESSION: 1. Bibasilar consolidations, RIGHT greater than LEFT, compatible with multifocal pneumonia and/or aspiration. Favor aspiration as the esophagus is filled with fluid/debris to the level of the upper mediastinum predisposing the patient to potential aspirations. 2. Small bilateral pleural effusions. 3. Widespread osseous metastases, as previously described. 4. New small hypodense lesion within the upper aspect of the RIGHT liver lobe, additional cyst versus early metastasis (2 benign cysts within the LEFT liver lobe). Recommend nonemergent liver MRI for further characterization if clinically needed. 5. No bowel obstruction. No evidence of acute solid organ abnormality within the abdomen or pelvis. No renal or ureteral calculi. 6. Gastrostomy tube appears appropriately positioned within the body of the stomach. Electronically Signed   By: Bary Richard M.D.   On: 02/12/2023 13:25   ECHOCARDIOGRAM COMPLETE  Result Date: 02/12/2023    ECHOCARDIOGRAM REPORT   Patient Name:   SHERBY  E Delgreco Date of Exam: 02/12/2023 Medical Rec #:  [id number]     Height:       64.0 in Accession #:    [id number]    Weight:       84.2 lb Date of Birth:  Mar 29, 1941      BSA:          1.355 m Patient Age:    82 years      BP:           136/72 mmHg Patient Gender: F             HR:           96 bpm. Exam Location:  Inpatient Procedure: 2D Echo, Cardiac Doppler and Color Doppler Indications:    A-fib  History:        Patient has no prior history of Echocardiogram examinations.  Sonographer:    Lucy Antigua Referring Phys: [id number] Ranee Gosselin IMPRESSIONS  1. Left ventricular ejection fraction, by estimation, is 45 to 50%. The left ventricle has mildly decreased function. The left ventricle has no regional wall motion abnormalities. Left ventricular diastolic parameters are consistent with Grade I diastolic dysfunction (impaired relaxation).  2. Right ventricular systolic function is normal. The right ventricular size is normal. There is normal pulmonary artery systolic pressure. The estimated right ventricular systolic pressure is 23.8 mmHg.  3. Left atrial size was mildly dilated.  4. The mitral valve is normal in structure. Mild mitral valve regurgitation. No evidence of mitral stenosis.  5. The aortic valve is tricuspid. Aortic valve regurgitation is moderate. No aortic stenosis is present.  6. The inferior vena cava is normal in size with <50% respiratory variability, suggesting right atrial pressure of 8 mmHg. Comparison(s): No prior Echocardiogram. FINDINGS  Left Ventricle: Left ventricular ejection fraction, by estimation, is 45 to 50%. The left ventricle has mildly decreased function. The left ventricle has no regional wall motion abnormalities. The left ventricular internal cavity size was normal in size. There is no left ventricular hypertrophy. Left ventricular diastolic  parameters are consistent with Grade I diastolic dysfunction (impaired relaxation). Right Ventricle: The right ventricular size is normal. No increase in right ventricular wall thickness. Right ventricular systolic function is normal. There is normal pulmonary artery systolic pressure. The tricuspid regurgitant velocity is 2.28 m/s, and  with an assumed right atrial pressure of 3 mmHg, the estimated right ventricular systolic pressure is 23.8 mmHg. Left Atrium: Left atrial size was mildly dilated. Right Atrium: Right atrial size was normal in size. Pericardium: There is no evidence of pericardial effusion. Mitral Valve: The mitral valve is normal in structure. Mild mitral annular calcification. Mild mitral valve regurgitation. No evidence of mitral valve stenosis. Tricuspid Valve: The tricuspid valve is normal in structure. Tricuspid valve regurgitation is trivial. No evidence of tricuspid stenosis. Aortic Valve: The aortic valve is tricuspid. Aortic valve regurgitation is moderate. No aortic stenosis is present. Aortic valve mean gradient measures 5.0 mmHg. Aortic valve peak gradient measures 6.9 mmHg. Aortic valve area, by VTI measures 1.61 cm. Pulmonic Valve: The pulmonic valve was not well visualized. Pulmonic valve regurgitation is not visualized. No evidence of pulmonic stenosis. Aorta: The aortic root and ascending aorta are structurally normal, with no evidence of dilitation. Venous: The inferior vena cava is normal in size with less than 50% respiratory variability, suggesting right atrial pressure of 8 mmHg. IAS/Shunts: No atrial level shunt detected by color flow Doppler.  LEFT VENTRICLE PLAX 2D LVIDd:  5.00 cm     Diastology LVIDs:         4.00 cm     LV e' medial:    5.22 cm/s LV PW:         0.90 cm     LV E/e' medial:  14.4 LV IVS:        0.80 cm     LV e' lateral:   8.81 cm/s LVOT diam:     1.70 cm     LV E/e' lateral: 8.6 LV SV:         44 LV SV Index:   32 LVOT Area:     2.27 cm  LV Volumes  (MOD) LV vol d, MOD A4C: 82.7 ml LV vol s, MOD A4C: 37.9 ml LV SV MOD A4C:     82.7 ml RIGHT VENTRICLE             IVC RV S prime:     14.40 cm/s  IVC diam: 1.90 cm TAPSE (M-mode): 2.4 cm LEFT ATRIUM             Index        RIGHT ATRIUM           Index LA Vol (A2C):   35.1 ml 25.91 ml/m  RA Area:     12.40 cm LA Vol (A4C):   50.3 ml 37.13 ml/m  RA Volume:   28.60 ml  21.11 ml/m LA Biplane Vol: 45.3 ml 33.44 ml/m  AORTIC VALVE AV Area (Vmax):    1.75 cm AV Area (Vmean):   1.48 cm AV Area (VTI):     1.61 cm AV Vmax:           131.00 cm/s AV Vmean:          104.000 cm/s AV VTI:            0.271 m AV Peak Grad:      6.9 mmHg AV Mean Grad:      5.0 mmHg LVOT Vmax:         101.00 cm/s LVOT Vmean:        67.700 cm/s LVOT VTI:          0.192 m LVOT/AV VTI ratio: 0.71  AORTA Ao Root diam: 2.90 cm Ao Asc diam:  2.90 cm MITRAL VALVE               TRICUSPID VALVE MV Area (PHT): 3.72 cm    TR Peak grad:   20.8 mmHg MV Decel Time: 204 msec    TR Vmax:        228.00 cm/s MR Peak grad: 94.5 mmHg MR Vmax:      486.00 cm/s  SHUNTS MV E velocity: 75.40 cm/s  Systemic VTI:  0.19 m MV A velocity: 96.40 cm/s  Systemic Diam: 1.70 cm MV E/A ratio:  0.78 Vishnu Priya Mallipeddi Electronically signed by Winfield Rast Mallipeddi Signature Date/Time: 02/12/2023/12:08:49 PM    Final    DG ABDOMEN PEG TUBE LOCATION  Result Date: 02/11/2023 CLINICAL DATA:  PEG tube placement EXAM: ABDOMEN - 1 VIEW COMPARISON:  11/01/2022 FINDINGS: Contrast administered via indwelling gastrostomy tube opacifies the stomach, confirming intragastric placement. Proximal small bowel is also opacified. No extraluminal contrast is visualized. IMPRESSION: Contrast administered via indwelling gastrostomy tube opacifies the stomach, confirming intragastric placement. Electronically Signed   By: Charline Bills M.D.   On: 02/11/2023 20:53   US THORACENTESIS ASP PLEURAL SPACE W/IMG GUIDE  Result Date: 02/11/2023 INDICATION: Shortness  of breath. Right-sided  pleural effusion. Request for thoracentesis. EXAM: ULTRASOUND GUIDED RIGHT THORACENTESIS MEDICATIONS: 1% plain lidocaine, 4 mL COMPLICATIONS: None immediate. PROCEDURE: An ultrasound guided thoracentesis was thoroughly discussed with the patient and questions answered. The benefits, risks, alternatives and complications were also discussed. The patient understands and wishes to proceed with the procedure. Written consent was obtained. Ultrasound was performed to localize and mark an adequate pocket of fluid in the right chest. The area was then prepped and draped in the normal sterile fashion. 1% Lidocaine was used for local anesthesia. Under ultrasound guidance a 6 Fr Safe-T-Centesis catheter was introduced. Thoracentesis was performed. The catheter was removed and a dressing applied. FINDINGS: A total of approximately 500 mL of slightly hazy yellow fluid was removed. Samples were obtained for labs if requested by the clinical team. IMPRESSION: Successful ultrasound guided right thoracentesis yielding 500 mL of pleural fluid. Read by: Brayton El PA-C Electronically Signed   By: Gilmer Mor D.O.   On: 02/11/2023 11:36   DG Chest 1 View  Result Date: 02/11/2023 CLINICAL DATA:  Thoracentesis today EXAM: CHEST  1 VIEW COMPARISON:  Yesterday FINDINGS: Lucency at the right base could be reaerated lung or small loculated pneumothorax. No apical pneumothorax. Bilateral airspace disease and pleural thickening at the apices. IMPRESSION: 1. Improved aeration on the right. Lucency at the lateral right costophrenic sulcus could be aerated lung or tiny loculated pneumothorax. No large or apical pneumothorax. 2. Bilateral airspace disease. Electronically Signed   By: Tiburcio Pea M.D.   On: 02/11/2023 09:58      LOS: 3 days    Jacquelin Hawking, MD [facility] 02/13/2023, 9:18 AM   If 7PM-7AM, please contact night-coverage [email or web address]

## 2023-02-13 NOTE — Progress Notes (Signed)
Referring Physician(s): Nettey,R  Supervising Physician: Oley Balm  Patient Status:  [facility] - In-pt  Chief Complaint: Aspiration pneumonia, reflux, leaking at gastrostomy tube   Subjective: Pt known to IR team from G tube placement on 01/13/21, G tube exchanges on 03/08/22, 05/17/22 and 11/01/22 along with right thoracentesis on 02/10/23. She is an 82 yo female with hx hypothyroidism with medullary thyroid cancer, dysphagia, left breast cancer, lung cancer, CHF who was recently admitted to [facility] with multifocal pneumonia. She has received antbx therapy and is on high flow [place]. Due to concerns over continued leakage at G tube site, significant reflux and aspiration pneumonia request now received for conversion of G tube to J tube. Pt denies fever,HA, CP, worsening dyspnea, abd/back pain,N/V or bleeding. WBC 10.7 today. Latest pleural/blood cx neg to date.     Past Medical History:  Diagnosis Date   Anxiety    Breast cancer 1999   left lumpectomy/radiation/ductal carcinoma in situ   Cancer    Colon polyp    Contact lens/glasses fitting    HAS LENS IMPLANTS   Lung cancer 2015   LYMPHNODE REMOVAL    Medullary carcinoma of thyroid 2011   Osteoporosis    Past Surgical History:  Procedure Laterality Date   ABDOMINAL HYSTERECTOMY     ABDOMINAL SURGERY  1989   RUPTURED APPENDIX   BREAST SURGERY  1999   LEFT BREAST LUMPECTOMY. DUCTAL CARCINOMA IN SITU./ RADIATION   CATARACT EXTRACTION W/ INTRAOCULAR LENS IMPLANT  2009   BOTH EYES   FEMUR IM NAIL Left 10/23/2017   Procedure: INTRAMEDULLARY (IM) NAIL FEMORAL;  Surgeon: Durene Romans, MD;  Location: [facility] OR;  Service: Orthopedics;  Laterality: Left;   FOOT SURGERY  1070   RIGHT FOOT - PINCHED NERVE   IR GASTROSTOMY TUBE MOD SED  01/13/2021   IR REPLACE G-TUBE SIMPLE WO FLUORO  03/08/2022   IR REPLACE G-TUBE SIMPLE WO FLUORO  11/01/2022   IR REPLC GASTRO/COLONIC TUBE PERCUT W/FLUORO  05/17/2022   LUNG SURGERY     CALCIFICATIONS, POSITIVE -  CANCER LYMPHNODE    MELANOMA EXCISION  2008   CHEST AREA   NECK SURGERY  Nov 09 2011   LYMPHNODES REMOVED 2 POSITIVE    RIGHT COLECTOMY  06/02/2009   THYROIDECTOMY  11/06/2009   TONSILLECTOMY AND ADENOIDECTOMY       Allergies: Codeine  Medications: Prior to Admission medications   Medication Sig Start Date End Date Taking? Authorizing Provider  albuterol (VENTOLIN HFA) 108 (90 Base) MCG/ACT inhaler Inhale 1-2 puffs into the lungs every 6 (six) hours as needed for wheezing or shortness of breath. 06/16/22  Yes [provider]  ALPRAZolam Prudy Feeler) 1 MG tablet Take 0.5 tablets (0.5 mg total) by mouth 2 (two) times daily. 10/27/17  Yes Rhetta Mura, MD  bacitracin ophthalmic ointment Place 1 Application into both eyes 3 (three) times daily. 08/15/20  Yes [provider]  Calcium Carbonate-Vit D-Min (CALCIUM 600 + MINERALS PO) Take 1 tablet by mouth daily.   Yes [provider]  cetirizine (ZYRTEC) 10 MG tablet Take 10 mg by mouth daily.   Yes [provider]  Cholecalciferol (VITAMIN D) 1000 UNITS capsule Take 1,000 Units by mouth daily.   Yes [provider]  diphenhydramine-acetaminophen (TYLENOL PM EXTRA STRENGTH) 25-500 MG TABS tablet Take 1 tablet by mouth at bedtime as needed (sleep / pain).   Yes [provider]  Glucosamine-Chondroitin 250-200 MG CAPS Take 1 tablet by mouth daily. Name of  table is SCHIFF 12/23/10  Yes [provider]  latanoprost (XALATAN) 0.005 % ophthalmic solution Place 1 drop into both eyes at bedtime. 01/18/23  Yes [provider]  levothyroxine (SYNTHROID) 125 MCG tablet Take 1 tablet by mouth once daily 01/17/23  Yes Shamleffer, Konrad Dolores, MD  loperamide (IMODIUM A-D) 2 MG tablet Take 2 mg by mouth 3 (three) times daily.   Yes [provider]  Multiple Vitamin (MULTIVITAMIN) tablet Take 1 tablet by mouth daily.   Yes [provider]  Nutritional Supplements (FEEDING  SUPPLEMENT, OSMOLITE 1.5 CAL,) LIQD Take 237 mLs by mouth 5 (five) times daily. 10/26/22  Yes [provider]  ondansetron (ZOFRAN) 8 MG tablet Take 1 tablet by mouth every 8 hours as needed for nausea or vomiting. 01/06/23  Yes Johney Maine, MD  pantoprazole (PROTONIX) 40 MG tablet TAKE 1 TABLET BY MOUTH TWICE DAILY BEFORE A MEAL Patient taking differently: Take 40 mg by mouth 2 (two) times daily before a meal. 03/31/22  Yes Kale, Corene Cornea, MD  sucralfate (CARAFATE) 1 g tablet TAKE 1 TABLET BY MOUTH 4 TIMES DAILY WITH MEALS AND AT BEDTIME Patient taking differently: Take 1 g by mouth See admin instructions. TAKE 1 TABLET BY MOUTH 4 TIMES DAILY WITH MEALS AND AT BEDTIME 11/14/22  Yes Johney Maine, MD  vitamin E 180 MG (400 UNITS) capsule Take 400 Units by mouth daily.   Yes [provider]  zoledronic acid (RECLAST) 5 MG/100ML SOLN injection Inject 5 mg into the vein once.   Yes [provider]  cholestyramine (QUESTRAN) 4 g packet DISSOLVE 1 PACKET IN LIQUID OF CHOICE AND DRINK 2 TIMES A DAY Patient not taking: Reported on 02/11/2023 04/09/21   Shamleffer, Konrad Dolores, MD  HYDROcodone-acetaminophen (NORCO/VICODIN) 5-325 MG tablet Take 1 tablet by mouth every 6 (six) hours as needed. Patient not taking: Reported on 02/11/2023 01/16/23   Terrilee Files, MD     Vital Signs: BP 123/63   Pulse 100   Temp (!) 97.4 F (36.3 C) (Oral)   Resp (!) 24   Ht 5\' 4"  (1.626 m)   Wt 84 lb 3.5 oz (38.2 kg)   SpO2 96%   BMI 14.46 kg/m   Physical Exam: awake/alert; chest- sl dim BS bases; heart- sl tachy but reg rhythm, abd- soft, few BS, G tube in place with small amount drainage noted underneath gauze, area sl tender to palpation, some skin maceration/erythema noted; no sig LE edema   Imaging: CT Angio Chest Pulmonary Embolism (PE) W or WO Contrast  Result Date: 02/12/2023 CLINICAL DATA:  Acute hypoxic respiratory failure. Multifocal pneumonia. Bowel  obstruction suspected. EXAM: CT ANGIOGRAPHY CHEST CT ABDOMEN AND PELVIS WITH CONTRAST TECHNIQUE: Multidetector CT imaging of the chest was performed using the standard protocol during bolus administration of intravenous contrast. Multiplanar CT image reconstructions and MIPs were obtained to evaluate the vascular anatomy. Multidetector CT imaging of the abdomen and pelvis was performed using the standard protocol during bolus administration of intravenous contrast. RADIATION DOSE REDUCTION: This exam was performed according to the departmental dose-optimization program which includes automated exposure control, adjustment of the mA and/or kV according to patient size and/or use of iterative reconstruction technique. CONTRAST:  OMNIPAQUE IOHEXOL 350 MG/ML SOLN COMPARISON:  Chest abdomen and pelvis CT dated 06/18/2019. Chest CT dated 06/22/2020. CT thoracic and lumbar spine dated 01/16/2023. PET-CT dated 04/06/2022. FINDINGS: Cardiovascular: There is no pulmonary embolism identified within the main, lobar or segmental pulmonary arteries  of either lung. No thoracic aortic aneurysm or evidence of aortic dissection. No clinically significant pericardial effusion. Mediastinum/Nodes: No mass or enlarged lymph nodes are seen within the mediastinum or perihilar regions. Esophagus appears to be filled with fluid/debris to the level of the mid trachea. Trachea and central bronchi are unremarkable. Lungs/Pleura: Extensive biapical pleural-parenchymal scarring with associated architectural distortion, not significantly changed compared to the chest CT of 06/22/2020. Bibasilar consolidations, RIGHT greater than LEFT. Smaller patchy consolidations within the lingula and RIGHT middle lobe. Focal ground-glass opacity within the anterior aspect of the RIGHT upper lobe. Small bilateral pleural effusions. Musculoskeletal: Widespread osseous metastases, as previously described. Review of the MIP images confirms the above findings.  CT ABDOMEN and PELVIS FINDINGS Hepatobiliary: Stable hypodense lesions within the LEFT liver lobe, consistent with benign cysts. New small hypodense lesion within the upper aspect of the RIGHT liver lobe (series 12, image 16), cyst versus early metastasis. Gallbladder appears normal. No significant bile duct dilatation seen. Pancreas: Unremarkable. No pancreatic ductal dilatation or surrounding inflammatory changes. Spleen: Normal in size without focal abnormality. Adrenals/Urinary Tract: Adrenal glands are unremarkable. Kidneys are unremarkable without suspicious mass, stone or hydronephrosis. No ureteral or bladder calculi identified. Bladder decompressed by Foley catheter. Stomach/Bowel: No dilated large or small bowel loops. Gastrostomy tube appears appropriately positioned within the body of the stomach. Vascular/Lymphatic: No abdominal aortic aneurysm. No acute-appearing vascular abnormality. No enlarged lymph nodes are identified in the abdomen or pelvis. Reproductive: No adnexal mass or free fluid seen. Other: No free fluid or abscess collection. No free intraperitoneal air. Musculoskeletal: Widespread osseous metastases, as previously described. Review of the MIP images confirms the above findings. IMPRESSION: 1. Bibasilar consolidations, RIGHT greater than LEFT, compatible with multifocal pneumonia and/or aspiration. Favor aspiration as the esophagus is filled with fluid/debris to the level of the upper mediastinum predisposing the patient to potential aspirations. 2. Small bilateral pleural effusions. 3. Widespread osseous metastases, as previously described. 4. New small hypodense lesion within the upper aspect of the RIGHT liver lobe, additional cyst versus early metastasis (2 benign cysts within the LEFT liver lobe). Recommend nonemergent liver MRI for further characterization if clinically needed. 5. No bowel obstruction. No evidence of acute solid organ abnormality within the abdomen or pelvis. No  renal or ureteral calculi. 6. Gastrostomy tube appears appropriately positioned within the body of the stomach. Electronically Signed   By: Bary Richard M.D.   On: 02/12/2023 13:25   CT ABDOMEN PELVIS W CONTRAST  Result Date: 02/12/2023 CLINICAL DATA:  Acute hypoxic respiratory failure. Multifocal pneumonia. Bowel obstruction suspected. EXAM: CT ANGIOGRAPHY CHEST CT ABDOMEN AND PELVIS WITH CONTRAST TECHNIQUE: Multidetector CT imaging of the chest was performed using the standard protocol during bolus administration of intravenous contrast. Multiplanar CT image reconstructions and MIPs were obtained to evaluate the vascular anatomy. Multidetector CT imaging of the abdomen and pelvis was performed using the standard protocol during bolus administration of intravenous contrast. RADIATION DOSE REDUCTION: This exam was performed according to the departmental dose-optimization program which includes automated exposure control, adjustment of the mA and/or kV according to patient size and/or use of iterative reconstruction technique. CONTRAST:  OMNIPAQUE IOHEXOL 350 MG/ML SOLN COMPARISON:  Chest abdomen and pelvis CT dated 06/18/2019. Chest CT dated 06/22/2020. CT thoracic and lumbar spine dated 01/16/2023. PET-CT dated 04/06/2022. FINDINGS: Cardiovascular: There is no pulmonary embolism identified within the main, lobar or segmental pulmonary arteries of either lung. No thoracic aortic aneurysm or evidence of aortic dissection. No clinically significant pericardial  effusion. Mediastinum/Nodes: No mass or enlarged lymph nodes are seen within the mediastinum or perihilar regions. Esophagus appears to be filled with fluid/debris to the level of the mid trachea. Trachea and central bronchi are unremarkable. Lungs/Pleura: Extensive biapical pleural-parenchymal scarring with associated architectural distortion, not significantly changed compared to the chest CT of 06/22/2020. Bibasilar consolidations, RIGHT greater  than LEFT. Smaller patchy consolidations within the lingula and RIGHT middle lobe. Focal ground-glass opacity within the anterior aspect of the RIGHT upper lobe. Small bilateral pleural effusions. Musculoskeletal: Widespread osseous metastases, as previously described. Review of the MIP images confirms the above findings. CT ABDOMEN and PELVIS FINDINGS Hepatobiliary: Stable hypodense lesions within the LEFT liver lobe, consistent with benign cysts. New small hypodense lesion within the upper aspect of the RIGHT liver lobe (series 12, image 16), cyst versus early metastasis. Gallbladder appears normal. No significant bile duct dilatation seen. Pancreas: Unremarkable. No pancreatic ductal dilatation or surrounding inflammatory changes. Spleen: Normal in size without focal abnormality. Adrenals/Urinary Tract: Adrenal glands are unremarkable. Kidneys are unremarkable without suspicious mass, stone or hydronephrosis. No ureteral or bladder calculi identified. Bladder decompressed by Foley catheter. Stomach/Bowel: No dilated large or small bowel loops. Gastrostomy tube appears appropriately positioned within the body of the stomach. Vascular/Lymphatic: No abdominal aortic aneurysm. No acute-appearing vascular abnormality. No enlarged lymph nodes are identified in the abdomen or pelvis. Reproductive: No adnexal mass or free fluid seen. Other: No free fluid or abscess collection. No free intraperitoneal air. Musculoskeletal: Widespread osseous metastases, as previously described. Review of the MIP images confirms the above findings. IMPRESSION: 1. Bibasilar consolidations, RIGHT greater than LEFT, compatible with multifocal pneumonia and/or aspiration. Favor aspiration as the esophagus is filled with fluid/debris to the level of the upper mediastinum predisposing the patient to potential aspirations. 2. Small bilateral pleural effusions. 3. Widespread osseous metastases, as previously described. 4. New small hypodense  lesion within the upper aspect of the RIGHT liver lobe, additional cyst versus early metastasis (2 benign cysts within the LEFT liver lobe). Recommend nonemergent liver MRI for further characterization if clinically needed. 5. No bowel obstruction. No evidence of acute solid organ abnormality within the abdomen or pelvis. No renal or ureteral calculi. 6. Gastrostomy tube appears appropriately positioned within the body of the stomach. Electronically Signed   By: Bary Richard M.D.   On: 02/12/2023 13:25   ECHOCARDIOGRAM COMPLETE  Result Date: 02/12/2023    ECHOCARDIOGRAM REPORT   Patient Name:   Shannon [name] Date of Exam: 02/12/2023 Medical Rec #:  [id number]     Height:       64.0 in Accession #:    [id number]    Weight:       84.2 lb Date of Birth:  [birth year]      BSA:          1.355 m Patient Age:    82 years      BP:           136/72 mmHg Patient Gender: F             HR:           96 bpm. Exam Location:  Inpatient Procedure: 2D Echo, Cardiac Doppler and Color Doppler Indications:    A-fib  History:        Patient has no prior history of Echocardiogram examinations.  Sonographer:    Lucy Antigua Referring Phys: [id number] Ranee Gosselin IMPRESSIONS  1. Left ventricular ejection fraction, by estimation, is 45 to 50%. The  left ventricle has mildly decreased function. The left ventricle has no regional wall motion abnormalities. Left ventricular diastolic parameters are consistent with Grade I diastolic dysfunction (impaired relaxation).  2. Right ventricular systolic function is normal. The right ventricular size is normal. There is normal pulmonary artery systolic pressure. The estimated right ventricular systolic pressure is 23.8 mmHg.  3. Left atrial size was mildly dilated.  4. The mitral valve is normal in structure. Mild mitral valve regurgitation. No evidence of mitral stenosis.  5. The aortic valve is tricuspid. Aortic valve regurgitation is moderate. No aortic stenosis is present.  6. The inferior vena  cava is normal in size with <50% respiratory variability, suggesting right atrial pressure of 8 mmHg. Comparison(s): No prior Echocardiogram. FINDINGS  Left Ventricle: Left ventricular ejection fraction, by estimation, is 45 to 50%. The left ventricle has mildly decreased function. The left ventricle has no regional wall motion abnormalities. The left ventricular internal cavity size was normal in size. There is no left ventricular hypertrophy. Left ventricular diastolic parameters are consistent with Grade I diastolic dysfunction (impaired relaxation). Right Ventricle: The right ventricular size is normal. No increase in right ventricular wall thickness. Right ventricular systolic function is normal. There is normal pulmonary artery systolic pressure. The tricuspid regurgitant velocity is 2.28 m/s, and  with an assumed right atrial pressure of 3 mmHg, the estimated right ventricular systolic pressure is 23.8 mmHg. Left Atrium: Left atrial size was mildly dilated. Right Atrium: Right atrial size was normal in size. Pericardium: There is no evidence of pericardial effusion. Mitral Valve: The mitral valve is normal in structure. Mild mitral annular calcification. Mild mitral valve regurgitation. No evidence of mitral valve stenosis. Tricuspid Valve: The tricuspid valve is normal in structure. Tricuspid valve regurgitation is trivial. No evidence of tricuspid stenosis. Aortic Valve: The aortic valve is tricuspid. Aortic valve regurgitation is moderate. No aortic stenosis is present. Aortic valve mean gradient measures 5.0 mmHg. Aortic valve peak gradient measures 6.9 mmHg. Aortic valve area, by VTI measures 1.61 cm. Pulmonic Valve: The pulmonic valve was not well visualized. Pulmonic valve regurgitation is not visualized. No evidence of pulmonic stenosis. Aorta: The aortic root and ascending aorta are structurally normal, with no evidence of dilitation. Venous: The inferior vena cava is normal in size with less than  50% respiratory variability, suggesting right atrial pressure of 8 mmHg. IAS/Shunts: No atrial level shunt detected by color flow Doppler.  LEFT VENTRICLE PLAX 2D LVIDd:         5.00 cm     Diastology LVIDs:         4.00 cm     LV e' medial:    5.22 cm/s LV PW:         0.90 cm     LV E/e' medial:  14.4 LV IVS:        0.80 cm     LV e' lateral:   8.81 cm/s LVOT diam:     1.70 cm     LV E/e' lateral: 8.6 LV SV:         44 LV SV Index:   32 LVOT Area:     2.27 cm  LV Volumes (MOD) LV vol d, MOD A4C: 82.7 ml LV vol s, MOD A4C: 37.9 ml LV SV MOD A4C:     82.7 ml RIGHT VENTRICLE             IVC RV S prime:     14.40 cm/s  IVC diam: 1.90 cm  TAPSE (M-mode): 2.4 cm LEFT ATRIUM             Index        RIGHT ATRIUM           Index LA Vol (A2C):   35.1 ml 25.91 ml/m  RA Area:     12.40 cm LA Vol (A4C):   50.3 ml 37.13 ml/m  RA Volume:   28.60 ml  21.11 ml/m LA Biplane Vol: 45.3 ml 33.44 ml/m  AORTIC VALVE AV Area (Vmax):    1.75 cm AV Area (Vmean):   1.48 cm AV Area (VTI):     1.61 cm AV Vmax:           131.00 cm/s AV Vmean:          104.000 cm/s AV VTI:            0.271 m AV Peak Grad:      6.9 mmHg AV Mean Grad:      5.0 mmHg LVOT Vmax:         101.00 cm/s LVOT Vmean:        67.700 cm/s LVOT VTI:          0.192 m LVOT/AV VTI ratio: 0.71  AORTA Ao Root diam: 2.90 cm Ao Asc diam:  2.90 cm MITRAL VALVE               TRICUSPID VALVE MV Area (PHT): 3.72 cm    TR Peak grad:   20.8 mmHg MV Decel Time: 204 msec    TR Vmax:        228.00 cm/s MR Peak grad: 94.5 mmHg MR Vmax:      486.00 cm/s  SHUNTS MV E velocity: 75.40 cm/s  Systemic VTI:  0.19 m MV A velocity: 96.40 cm/s  Systemic Diam: 1.70 cm MV E/A ratio:  0.78 Vishnu Priya Mallipeddi Electronically signed by Winfield Rast Mallipeddi Signature Date/Time: 02/12/2023/12:08:49 PM    Final    DG ABDOMEN PEG TUBE LOCATION  Result Date: 02/11/2023 CLINICAL DATA:  PEG tube placement EXAM: ABDOMEN - 1 VIEW COMPARISON:  11/01/2022 FINDINGS: Contrast administered via  indwelling gastrostomy tube opacifies the stomach, confirming intragastric placement. Proximal small bowel is also opacified. No extraluminal contrast is visualized. IMPRESSION: Contrast administered via indwelling gastrostomy tube opacifies the stomach, confirming intragastric placement. Electronically Signed   By: Charline Bills M.D.   On: 02/11/2023 20:53   US THORACENTESIS ASP PLEURAL SPACE W/IMG GUIDE  Result Date: 02/11/2023 INDICATION: Shortness of breath. Right-sided pleural effusion. Request for thoracentesis. EXAM: ULTRASOUND GUIDED RIGHT THORACENTESIS MEDICATIONS: 1% plain lidocaine, 4 mL COMPLICATIONS: None immediate. PROCEDURE: An ultrasound guided thoracentesis was thoroughly discussed with the patient and questions answered. The benefits, risks, alternatives and complications were also discussed. The patient understands and wishes to proceed with the procedure. Written consent was obtained. Ultrasound was performed to localize and mark an adequate pocket of fluid in the right chest. The area was then prepped and draped in the normal sterile fashion. 1% Lidocaine was used for local anesthesia. Under ultrasound guidance a 6 Fr Safe-T-Centesis catheter was introduced. Thoracentesis was performed. The catheter was removed and a dressing applied. FINDINGS: A total of approximately 500 mL of slightly hazy yellow fluid was removed. Samples were obtained for labs if requested by the clinical team. IMPRESSION: Successful ultrasound guided right thoracentesis yielding 500 mL of pleural fluid. Read by: Brayton El PA-C Electronically Signed   By: Gilmer Mor D.O.   On: 02/11/2023 11:36  DG Chest 1 View  Result Date: 02/11/2023 CLINICAL DATA:  Thoracentesis today EXAM: CHEST  1 VIEW COMPARISON:  Yesterday FINDINGS: Lucency at the right base could be reaerated lung or small loculated pneumothorax. No apical pneumothorax. Bilateral airspace disease and pleural thickening at the apices. IMPRESSION:  1. Improved aeration on the right. Lucency at the lateral right costophrenic sulcus could be aerated lung or tiny loculated pneumothorax. No large or apical pneumothorax. 2. Bilateral airspace disease. Electronically Signed   By: Tiburcio Pea M.D.   On: 02/11/2023 09:58   DG Chest Port 1 View  Result Date: 02/10/2023 CLINICAL DATA:  Shortness of breath EXAM: PORTABLE CHEST 1 VIEW COMPARISON:  Radiograph 06/17/2022 FINDINGS: Unchanged cardiomediastinal silhouette. Mild interstitial opacities. There is a small to moderate size right pleural effusion with adjacent basilar opacities. Additional patchy left basilar and left midlung opacities. Unchanged right apical calcification and associated lung opacity. No evidence of pneumothorax. Diffuse osseous metastatic lesions, best visualized on prior CTs. IMPRESSION: Small to moderate size right pleural effusion. Adjacent right lower lung opacities and additional patchy left basilar and left mid lung opacities, could reflect atelectasis and/or pneumonia. Interstitial prominence could reflect a degree of pulmonary edema. Electronically Signed   By: Caprice Renshaw M.D.   On: 02/10/2023 13:59    Labs:  CBC: Recent Labs    02/10/23 1804 02/11/23 0027 02/12/23 0305 02/13/23 0307  WBC 9.6 9.9 12.1* 10.7*  HGB 11.6* 11.6* 12.3 11.5*  HCT 37.0 38.0 39.4 37.6  PLT 238 268 303 279    COAGS: No results for input(s): "INR", "APTT" in the last 8760 hours.  BMP: Recent Labs    02/10/23 1317 02/10/23 1804 02/11/23 0027 02/12/23 0305 02/13/23 0307  NA 137  --  136 141 139  K 4.4  --  4.1 3.3* 3.3*  CL 96*  --  98 99 102  CO2 31  --  28 30 28   GLUCOSE 139*  --  314* 125* 95  BUN 35*  --  26* 26* 19  CALCIUM 8.6*  --  8.1* 8.3* 7.7*  CREATININE 0.67 0.63 0.54 0.54 0.59  GFRNONAA >60 >60 >60 >60 >60    LIVER FUNCTION TESTS: Recent Labs    11/14/22 1114 02/12/23 0305  BILITOT 0.6 0.6  AST 22 25  ALT 23 27  ALKPHOS 91 111  PROT 7.0 6.3*   ALBUMIN 3.9 2.5*    Assessment and Plan: 82 yo female with hx hypothyroidism with medullary thyroid cancer, dysphagia, left breast cancer, lung cancer, CHF who was recently admitted to [facility] with multifocal pneumonia. She has received antbx therapy and is on high flow [place]. Due to concerns over continued leakage at G tube site, significant reflux and aspiration pneumonia request now received for conversion of G tube to J tube. Pt known to IR team from G tube placement on 01/13/21, G tube exchanges on 03/08/22, 05/17/22 and 11/01/22 along with right thoracentesis on 02/10/23. Latest CT shows good positioning of G tube. Case d/w Dr. Caleb Popp and images reviewed by Dr. Deanne Coffer. Details/risks of procedure, incl but not limited to, internal bleeding, infection, inability to prevent leaking d/w pt/daughter in law with their understanding and consent. Procedure tent scheduled for later today.    Electronically Signed: D. Jeananne Rama, PA-C 02/13/2023, 10:13 AM   I spent a total of 15 Minutes at the the patient's bedside AND on the patient's hospital floor or unit, greater than 50% of which was counseling/coordinating care for conversion of gastrostomy  to jejunostomy tube.     Patient ID: Shannon [name], female   DOB: 1941/05/12, 82 y.o.   MRN: [id number]

## 2023-02-13 NOTE — Progress Notes (Signed)
Daily Progress Note   Patient Name: Shannon [name]       Date: 02/13/2023 DOB: 08/06/1941  Age: 82 y.o. MRN#: [id number] Attending Physician: Narda Bonds, MD Primary Care Physician: Richmond Campbell., PA-C Admit Date: 02/10/2023 Length of Stay: 3 days  Reason for Consultation/Follow-up: Establishing goals of care  Subjective:   CC: Patient noted feeling good today and wanting to discuss medical planning.  Following up regarding complex medical decision making.  Subjective: Presented to bedside to meet with patient and her sons.  Chaplain was present discussing ACP documentation.  Able to assist in review of this and what the medical options would be for her living will.  Spent extensive time reviewing each section with patient and her multiple family members who were at bedside including her 2 sons and their wives.  Patient wanting to complete documentation naming her sons as her healthcare power of attorney and primary alternate.  Patient also expressed just wishes regarding no organ donation and not wanting her life to be prolonged if: Has a condition that cannot be cured and that will result in her death within a relatively short period of time; becomes unconscious and my doctors determined that to a high degree of medical certainty she will never regain consciousness; suffers from advanced dementia or any other condition which resulted in the substantial loss of her ability to think and doctors determined that to a high degree of medical certainty it is not going to get better.  Patient also elected that her healthcare agents have the authority to make decisions that are different from what she has indicated in her living will. Patient's HCPOA is her son Mitchell Kasey with the primary alternate being her son Kriti Full.  Both sons and their wives are invested in patient's care.  Casimiro Needle is a retired Radiation protection practitioner. This form was able to be completed and notarized appropriately.   Scanned into EMR by chaplain.  Also spent time reviewing CODE STATUS with patient.  Patient is currently full code.  Explained to her what that would mean.  Also able to review DNR status and that would only take effect should patient physically stop breathing or her heart physically stop.  Patient noting would not want resuscitative efforts in that situation.  Patient wants to continue appropriate medical therapies up into that point.  Will note understanding that due to experiences and son's life (as a paramedic) concern that some care team members may see DNR status and not provide all available appropriate medical care.  While noted this is not the intention, understand family's worries regarding this.  Did provide DNR form as per patient's request so if she were to die at home, cardiac resuscitation would not be initiated by EMS.   Patient received J tube placement today. She and family are hopeful this will prevent future aspirations.   Also able to discuss home palliative medicine referral.  Organs of discussions and care coordination moving forward.  Also discussed differences between palliative care and hospice.  Patient and family agreeing with home palliative medicine referral.  Spent time providing emotional support via active listening.  Patient able to express difficulties in dealing with her husband's death last month.  At this time she is feeling more hopeful and supported and looking forward to quality time moving forward.  Review of Systems  Objective:   Vital Signs:  BP 123/63   Pulse 100   Temp (!) 97.4 F (36.3 C) (Oral)   Resp Marland Kitchen)  24   Ht 5\' 4"  (1.626 m)   Wt 38.2 kg   SpO2 96%   BMI 14.46 kg/m   Physical Exam: General: NAD, alert, chronically ill-appearing, frail Eyes: No drainage noted HENT: On HFNC support Cardiovascular: RRR Respiratory:  On HFNC support Abdomen: not distended Extremities: Moving all appropriately Skin: no rashes or lesions on visible  skin Neuro: A&Ox4, following commands easily Psych: appropriately answers all questions  Imaging:  I personally reviewed recent imaging.   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 Upon admission, patient found to have multifocal pneumonia. Patient has been receiving respiratory support on BiPAP/high flow nasal cannula as well as antibiotics for management. Palliative medicine team consulted to assist with complex medical decision making.   Recommendations/Plan: # Complex medical decision making/goals of care:  - Extensive conversations with patient and her 2 sons and their wives at bedside today as described above in HPI.  Patient completed ACP documentation which has been notarized and and into EMR.  Patient and family wanting to continue with appropriate medical therapies at this time. -Patient noted not wanting her life to be prolonged if: Has a condition that cannot be cured and that will result in her death within a relatively short period of time; becomes unconscious and my doctors determined that to a high degree of medical certainty she will never regain consciousness; suffers from advanced dementia or any other condition which resulted in the substantial loss of her ability to think and doctors determined that to a high degree of medical certainty it is not going to get better.  Patient also elected that her healthcare agents have the authority to make decisions that are different from what she has indicated in her living will. -Patient's HCPOA is her son Jeanmarie Sollman with the primary alternate being her son [name].  Both sons and their wives are invested in patient's care and discuss care  all together to make decisions. Of note, Casimiro Needle, is a retired Radiation protection practitioner.  -  Code Status: Full Code   -Due to Michael's prior experiences as a paramedic, has concerns about changing code status to DNR and patient not being able to continue to receive appropraite medical care. Acknowledged this and not this is not the intent with DNR. DNR comes into affect should patient be sick enough that she physically stops breathing or her heart stops beating and she is dead. Patient does not want CPR, mechanical intubation in that situation. Patient also acknowledges son's experience in the medical field and agrees with keeping code status full at this time.  If patient were to die at home, she would not was EMR to attempt resuscitation efforts. Did provide completed DNR form to keep at home should patient die at home and EMS has to be called.   # Symptom management:  -As per primary hospitalist  # Psychosocial Support:  -2 sons and daughter-in-laws  # Discharge Planning: Home with Home Health  -[facility] consult placed to assist with home palliative medicine referral.   Discussed with: patient, family, chaplain  Thank you for allowing the palliative care team to participate in the care [name].  Alvester Morin, DO Palliative Care Provider PMT # [phone]  If patient remains symptomatic despite maximum  doses, please call PMT at [phone] between 0700 and 1900. Outside of these hours, please call attending, as PMT does not have night coverage.  This provider spent a total of 65 minutes providing patient's care.  Includes review of EMR, discussing care with other staff members involved in patient's medical care, obtaining relevant history and information from patient and/or patient's family, and personal review of imaging and lab work. Greater than 50% of the time was spent counseling and coordinating care related to the above assessment and plan.    *Please note that this is a verbal dictation  therefore any spelling or grammatical errors are due to the "[employer]agon [employer] One" system interpretation.

## 2023-02-13 NOTE — Progress Notes (Signed)
NAME:  Shannon [name], MRN:  [id number], DOB:  October 25, 1941, LOS: 3 ADMISSION DATE:  02/10/2023, CONSULTATION DATE: 4/14 REFERRING MD: Dr. Caleb Popp, CHIEF COMPLAINT: Agitated delirium  History of Present Illness:  82 yo female with pmh hypothyroidism with medullary thyroid cancer, oropharyngeal dysphagia  s/p peg, L breast cancer, lung cancer presented to [facility] with multifocal pneumonia, started on abx req high flow [place] and intermittent NIV. This evening was not tolerating NIV 2/2 agitated delirium precedex was started and as such CCM was consulted for management of gtt.     Pt is drowsy on NIV and unable to get ROS  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   Cancer    Colon polyp    Contact lens/glasses fitting    HAS LENS IMPLANTS   Lung cancer 2015   LYMPHNODE REMOVAL    Medullary carcinoma of thyroid 2011   Osteoporosis    Significant Hospital Events: Including procedures, antibiotic start and stop dates in addition to other pertinent events   Admited 4/12, multifocal pna.  Precedex initiated for agitated delirium    Interim History / Subjective:  On high flow nasal cannula Awake alert interactive, comfortable  Objective   Blood pressure 130/69, pulse (!) 104, temperature (!) 97.4 F (36.3 C), temperature source Oral, resp. rate (!) 22, height  (1.626 m), weight 38.2 kg, SpO2 94 %.    FiO2 (%):  [85 %-90 %] 90 %   Intake/Output Summary (Last 24 hours) at 02/13/2023 0835 Last data filed at 02/13/2023 0505 Gross per 24 hour  Intake 485.72 ml  Output 1350 ml  Net -864.28 ml    Filed Weights   02/10/23 1314 02/11/23 0008  Weight: 43.1 kg 38.2 kg    Examination: General: Frail elderly female in NAD HENT: [place]/AT, PERRL, no JVD Lungs: Coarse crackles. Diminished on the L.  Cardiovascular: RRR< no MRG Abdomen: Soft, NT. ND Extremities: No acute deformity. No  edema.  Neuro: Awake alert oriented x 3  Chest x-ray reviewed with multilobar infiltrate  Resolved Hospital Problem list     Assessment & Plan:  Acute hypoxemic respiratory failure secondary to pneumonia -Continue oxygen supplementation -On high flow nasal cannula at present (30LPM, 90% FiO2) -Wean flow and FiO2 for sat goal 92% -PRN BiPAP  Multifocal pneumonia Community-acquired pneumonia -Continue empiric antibiotics-azithromycin/ceftriaxone -Follow cultures > NGTD  Sepsis Present on admission -Continue empiric antibiotics  Agitation/respiratory distress - On precedex 0.2. Low threshold to DC  Right pleural effusion: parapneumonic -S/p thoracentesis for 500 cc-tolerated well -Culture pending  Medullary thyroid carcinoma -Follows with oncology as outpatient Dr. Candise Che  History of breast cancer History of lung cancer    Best Practice (right click and "Reselect all SmartList Selections" daily)   Diet/type: NPO DVT prophylaxis: LMWH GI prophylaxis: N/A Lines: N/A Foley:  N/A Code Status:  full code Last date of multidisciplinary goals of care discussion [discussed with son]  Labs   CBC: Recent Labs  Lab 02/10/23 1317 02/10/23 1804 02/11/23 0027 02/12/23 0305 02/13/23 0307  WBC 9.2 9.6 9.9 12.1* 10.7*  NEUTROABS 7.7  --   --   --   --   HGB 12.8 11.6* 11.6* 12.3 11.5*  HCT 41.3 37.0 38.0 39.4 37.6  MCV 83.8 83.3 83.7 83.1 84.1  PLT 258 238 268 303 279     Basic Metabolic Panel: Recent Labs  Lab 02/10/23 1317 02/10/23 1804 02/11/23 0027 02/12/23 0305 02/13/23 0307  NA 137  --  136 141 139  K 4.4  --  4.1 3.3* 3.3*  CL 96*  --  98 99 102  CO2 31  --  28 30 28   GLUCOSE 139*  --  314* 125* 95  BUN 35*  --  26* 26* 19  CREATININE 0.67 0.63 0.54 0.54 0.59  CALCIUM 8.6*  --  8.1* 8.3* 7.7*  MG  --   --   --  2.2  --   PHOS  --   --   --  2.9  --     GFR: Estimated Creatinine Clearance: 32.7 mL/min (by C-G formula based on SCr of 0.59  mg/dL). Recent Labs  Lab 02/10/23 1317 02/10/23 1608 02/10/23 1804 02/11/23 0027 02/12/23 0305 02/13/23 0307  PROCALCITON  --   --   --   --  >150.00  --   WBC 9.2  --  9.6 9.9 12.1* 10.7*  LATICACIDVEN 1.3 1.0  --   --   --   --      Liver Function Tests: Recent Labs  Lab 02/12/23 0305  AST 25  ALT 27  ALKPHOS 111  BILITOT 0.6  PROT 6.3*  ALBUMIN 2.5*    No results for input(s): "LIPASE", "AMYLASE" in the last 168 hours. No results for input(s): "AMMONIA" in the last 168 hours.  ABG    Component Value Date/Time   HCO3 31.7 (H) 02/11/2023 0027   O2SAT 95.5 02/11/2023 0027     Coagulation Profile: No results for input(s): "INR", "PROTIME" in the last 168 hours.  Cardiac Enzymes: No results for input(s): "CKTOTAL", "CKMB", "CKMBINDEX", "TROPONINI" in the last 168 hours.  HbA1C: Hgb A1c MFr Bld  Date/Time Value Ref Range Status  02/12/2023 03:05 AM 6.0 (H) 4.8 - 5.6 % Final    Comment:    (NOTE) Pre diabetes:          5.7%-6.4%  Diabetes:              >6.4%  Glycemic control for   <7.0% adults with diabetes     CBG: Recent Labs  Lab 02/12/23 2022 02/12/23 2314 02/12/23 2349 02/13/23 0427 02/13/23 0747  GLUCAP 110* 121* 122* 114* 115*     Review of Systems:   Denies pain or discomfort at present  Past Medical History:  She,  has a past medical history of Anxiety, Breast cancer (1999), Cancer, Colon polyp, Contact lens/glasses fitting, Lung cancer (2015), Medullary carcinoma of thyroid (2011), and Osteoporosis.   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   CATARACT EXTRACTION W/ INTRAOCULAR LENS IMPLANT  2009   BOTH EYES   FEMUR IM NAIL Left 10/23/2017   Procedure: INTRAMEDULLARY (IM) NAIL FEMORAL;  Surgeon: Durene Romans, MD;  Location: [facility] OR;   Service: Orthopedics;  Laterality: Left;   FOOT SURGERY  1070   RIGHT FOOT - PINCHED NERVE   IR GASTROSTOMY TUBE MOD SED  01/13/2021   IR REPLACE G-TUBE SIMPLE WO FLUORO  03/08/2022   IR REPLACE G-TUBE SIMPLE WO FLUORO  11/01/2022   IR REPLC GASTRO/COLONIC TUBE PERCUT W/FLUORO  05/17/2022   LUNG SURGERY     CALCIFICATIONS, POSITIVE - CANCER LYMPHNODE    MELANOMA  EXCISION  2008   CHEST AREA   NECK SURGERY  Nov 09 2011   LYMPHNODES REMOVED 2 POSITIVE    RIGHT COLECTOMY  06/02/2009   THYROIDECTOMY  11/06/2009   TONSILLECTOMY AND ADENOIDECTOMY       Social History:   reports that she quit smoking about 62 years ago. Her smoking use included cigarettes. She has never used smokeless tobacco. She reports that she does not drink alcohol and does not use drugs.   Family History:  Her family history includes Breast cancer in her maternal aunt and sister; Cancer in her brother, cousin, father, paternal aunt, sister, and another family member.   Allergies Allergies  Allergen Reactions   Codeine Other (See Comments)    hyper Other reaction(s): insomnia    Critical care time 32 minutes  Joneen Roach, AGACNP-BC [facility]  See Amion for personal pager PCCM on call pager [phone] until 7pm. Please call Elink 7p-7a. [phone]  02/13/2023 8:38 AM

## 2023-02-13 NOTE — TOC Initial Note (Signed)
Transition of Care [facility]) - Initial/Assessment Note    Patient Details  Name: Shannon [name] MRN: [id number] Date of Birth: 02/25/1941  Transition of Care [facility]) CM/SW Contact:    Larrie Kass, LCSW Phone Number: 02/13/2023, 12:23 PM  Clinical Narrative:                  Per chart review from home has tube feeding. Palliative is following. TOC to follow for recommendations and d/c needs.       Patient Goals and CMS Choice            Expected Discharge Plan and Services                                              Prior Living Arrangements/Services                       Activities of Daily Living Home Assistive Devices/Equipment: Built-in shower seat, Feeding equipment, Enteral Feeding Supplies, Grab bars in shower, Shower chair with back, Environmental consultant (specify type) ADL Screening (condition at time of admission) Patient's cognitive ability adequate to safely complete daily activities?: Yes Is the patient deaf or have difficulty hearing?: No Does the patient have difficulty seeing, even when wearing glasses/contacts?: No Does the patient have difficulty concentrating, remembering, or making decisions?: Yes Patient able to express need for assistance with ADLs?: No Does the patient have difficulty dressing or bathing?: Yes Independently performs ADLs?: No Communication: Independent Dressing (OT): Needs assistance Is this a change from baseline?: Change from baseline, expected to last <3days Grooming: Needs assistance Is this a change from baseline?: Change from baseline, expected to last <3 days Feeding: Dependent Is this a change from baseline?: Pre-admission baseline Bathing: Needs assistance Is this a change from baseline?: Change from baseline, expected to last <3 days Toileting: Needs assistance Is this a change from baseline?: Change from baseline, expected to last <3 days In/Out Bed: Needs assistance Is this a change from baseline?:  Change from baseline, expected to last <3 days Walks in Home: Needs assistance Is this a change from baseline?: Change from baseline, expected to last <3 days Does the patient have difficulty walking or climbing stairs?: Yes Weakness of Legs: Both Weakness of Arms/Hands: Both  Permission Sought/Granted                  Emotional Assessment              Admission diagnosis:  Respiratory distress [R06.03] Pleural effusion [J90] Multifocal pneumonia [J18.9] Pneumonia due to infectious organism, unspecified laterality, unspecified part of lung [J18.9] Patient Active Problem List   Diagnosis Date Noted   Acute respiratory failure with hypoxia 02/13/2023   Delirium 02/13/2023   Palliative care encounter 02/12/2023   Respiratory distress 02/12/2023   Counseling and coordination of care 02/12/2023   Goals of care, counseling/discussion 02/12/2023   Pleural effusion 02/12/2023   Pneumonia due to infectious organism 02/10/2023   Parapneumonic effusion 02/10/2023   Malignant neoplasm metastatic to bone 08/09/2021   Skin lesion 01/15/2020   Age-related osteoporosis without current pathological fracture 07/15/2019   Chronic diarrhea 07/15/2019   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   Medullary carcinoma 07/15/2019   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, [place] - [address] [place] [address] Phone: [phone] Fax: [phone]  [id number] - [place], [place] - [address] [place] [address] Phone: [phone] Fax: [phone]  [place] [facility] [address] [place] [address] Phone: [phone] Fax: [phone]  [facility] [place], [place] - [address] [place] [address] Phone: [phone] Fax: [phone]     Social Determinants of Health (SDOH) Social History: SDOH Screenings   Food Insecurity: No Food Insecurity (02/12/2023)  Housing: Low Risk  (02/12/2023)  Transportation Needs: No Transportation Needs (02/12/2023)  Utilities: Not At Risk (02/12/2023)  Tobacco Use: Medium Risk (02/12/2023)   SDOH Interventions:     Readmission Risk Interventions     No data to display

## 2023-02-13 NOTE — Progress Notes (Signed)
Chaplain engaged in an initial visit with Shannon [name] and her family. Shannon [name] voiced that she lost her husband in March but expressed that she was happy he didn't have to see her go through this current health battle. Chaplain affirmed the love they shared in being married over 60 years. Chaplain also affirmed the community Shannon [name] has around her. Her son's and their wives are active participants in her care and life.   Chaplain was also able to provide education and support on the Advanced Directive, Healthcare POA. Shannon [name] expressed wanting her son and son's wives to be apart of her decision making. Chaplain assessed a healthy family system of them being able to listen to Shannon [name] needs and make decisions on her behalf. Chaplain did express [place] law to them that legally empowers Shannon [name]'s son, who are "next-of-kin" as her decision makers. Family along with physician, desired to proceed with document to pinpoint a primary and secondary decision maker.   Chaplain was able to facilitate the notarization and witnessing of the Advanced Directive, Healthcare POA. Chaplain provided them three copies, including the original. Chaplain also input one in [facility] medical chart and scanned one into our ACP system.  Chaplain Dionis Autry, MDiv  02/13/23 1400  Spiritual Encounters  Type of Visit Initial  Care provided to: Pt and family  Conversation partners present during encounter Physician  Referral source Physician  Reason for visit Advance directives  Spiritual Framework  Presenting Themes Goals in life/care;Significant life change;Community and relationships  Interventions  Spiritual Care Interventions Made Established relationship of care and support;Compassionate presence;Reflective listening;Explored values/beliefs/practices/strengths;Supported grief process;Decision-making support/facilitation  Intervention Outcomes  Outcomes Awareness of support;Reduced anxiety

## 2023-02-14 ENCOUNTER — Inpatient Hospital Stay (HOSPITAL_COMMUNITY): Payer: Medicare Other

## 2023-02-14 DIAGNOSIS — J918 Pleural effusion in other conditions classified elsewhere: Secondary | ICD-10-CM | POA: Diagnosis not present

## 2023-02-14 DIAGNOSIS — J189 Pneumonia, unspecified organism: Secondary | ICD-10-CM | POA: Diagnosis not present

## 2023-02-14 LAB — CBC WITH DIFFERENTIAL/PLATELET
Abs Immature Granulocytes: 0.51 10*3/uL — ABNORMAL HIGH (ref 0.00–0.07)
Basophils Absolute: 0.1 10*3/uL (ref 0.0–0.1)
Basophils Relative: 1 %
Eosinophils Absolute: 0.1 10*3/uL (ref 0.0–0.5)
Eosinophils Relative: 1 %
HCT: 43.2 % (ref 36.0–46.0)
Hemoglobin: 13.7 g/dL (ref 12.0–15.0)
Immature Granulocytes: 4 %
Lymphocytes Relative: 3 %
Lymphs Abs: 0.4 10*3/uL — ABNORMAL LOW (ref 0.7–4.0)
MCH: 26.1 pg (ref 26.0–34.0)
MCHC: 31.7 g/dL (ref 30.0–36.0)
MCV: 82.4 fL (ref 80.0–100.0)
Monocytes Absolute: 0.6 10*3/uL (ref 0.1–1.0)
Monocytes Relative: 5 %
Neutro Abs: 11.6 10*3/uL — ABNORMAL HIGH (ref 1.7–7.7)
Neutrophils Relative %: 86 %
Platelets: 347 10*3/uL (ref 150–400)
RBC: 5.24 MIL/uL — ABNORMAL HIGH (ref 3.87–5.11)
RDW: 16.7 % — ABNORMAL HIGH (ref 11.5–15.5)
WBC: 13.3 10*3/uL — ABNORMAL HIGH (ref 4.0–10.5)
nRBC: 0 % (ref 0.0–0.2)

## 2023-02-14 LAB — GLUCOSE, CAPILLARY
Glucose-Capillary: 101 mg/dL — ABNORMAL HIGH (ref 70–99)
Glucose-Capillary: 113 mg/dL — ABNORMAL HIGH (ref 70–99)
Glucose-Capillary: 117 mg/dL — ABNORMAL HIGH (ref 70–99)
Glucose-Capillary: 121 mg/dL — ABNORMAL HIGH (ref 70–99)
Glucose-Capillary: 132 mg/dL — ABNORMAL HIGH (ref 70–99)
Glucose-Capillary: 135 mg/dL — ABNORMAL HIGH (ref 70–99)
Glucose-Capillary: 143 mg/dL — ABNORMAL HIGH (ref 70–99)

## 2023-02-14 LAB — CULTURE, BLOOD (ROUTINE X 2)

## 2023-02-14 LAB — BODY FLUID CULTURE W GRAM STAIN: Gram Stain: NONE SEEN

## 2023-02-14 LAB — BASIC METABOLIC PANEL
Anion gap: 14 (ref 5–15)
BUN: 12 mg/dL (ref 8–23)
CO2: 24 mmol/L (ref 22–32)
Calcium: 8 mg/dL — ABNORMAL LOW (ref 8.9–10.3)
Chloride: 100 mmol/L (ref 98–111)
Creatinine, Ser: 0.6 mg/dL (ref 0.44–1.00)
GFR, Estimated: >60 mL/min (ref >60)
Glucose, Bld: 129 mg/dL — ABNORMAL HIGH (ref 70–99)
Potassium: 3 mmol/L — ABNORMAL LOW (ref 3.5–5.1)
Sodium: 138 mmol/L (ref 135–145)

## 2023-02-14 LAB — MAGNESIUM: Magnesium: 1.9 mg/dL (ref 1.7–2.4)

## 2023-02-14 MED ORDER — ALPRAZOLAM 0.25 MG PO TABS
0.2500 mg | ORAL_TABLET | Freq: Once | ORAL | Status: DC | PRN
  Filled 2023-02-14: qty 1

## 2023-02-14 MED ORDER — LATANOPROST 0.005 % OP SOLN
1.0000 [drp] | Freq: Every day | OPHTHALMIC | Status: DC
  Administered 2023-02-14 – 2023-02-20 (×6): 1 [drp] via OPHTHALMIC
  Filled 2023-02-14 (×2): qty 2.5

## 2023-02-14 MED ORDER — ALPRAZOLAM 0.5 MG PO TABS: 1.0000 mg | ORAL_TABLET | Freq: Two times a day (BID) | ORAL | Status: DC

## 2023-02-14 MED ORDER — POTASSIUM CHLORIDE 20 MEQ PO PACK
40.0000 meq | PACK | ORAL | Status: AC
  Administered 2023-02-14 (×2): 40 meq
  Filled 2023-02-14 (×2): qty 2

## 2023-02-14 MED ORDER — FLUTICASONE PROPIONATE 50 MCG/ACT NA SUSP
1.0000 | Freq: Every day | NASAL | Status: DC
  Administered 2023-02-14 – 2023-02-19 (×6): 1 via NASAL
  Filled 2023-02-14: qty 16

## 2023-02-14 MED ORDER — KATE FARMS STANDARD 1.4 PO LIQD
1680.0000 mL | ORAL | Status: DC
  Administered 2023-02-14 – 2023-02-16 (×2): 1680 mL
  Filled 2023-02-14 (×5): qty 1950

## 2023-02-14 MED ORDER — OSMOLITE 1.5 CAL PO LIQD
1000.0000 mL | ORAL | Status: DC
  Administered 2023-02-14: 1000 mL
  Filled 2023-02-14: qty 1000

## 2023-02-14 MED ORDER — ALPRAZOLAM 0.5 MG PO TABS
1.0000 mg | ORAL_TABLET | Freq: Two times a day (BID) | ORAL | Status: DC
  Administered 2023-02-14 – 2023-02-15 (×4): 1 mg via JEJUNOSTOMY
  Administered 2023-02-16: 0.5 mg via JEJUNOSTOMY
  Filled 2023-02-14 (×5): qty 2

## 2023-02-14 MED ORDER — TRAZODONE HCL 50 MG PO TABS
25.0000 mg | ORAL_TABLET | Freq: Every evening | ORAL | Status: DC | PRN
  Administered 2023-02-14 – 2023-02-18 (×5): 25 mg
  Filled 2023-02-14 (×5): qty 1

## 2023-02-14 MED ORDER — ALPRAZOLAM 0.25 MG PO TABS
0.2500 mg | ORAL_TABLET | Freq: Once | ORAL | Status: AC | PRN
  Administered 2023-02-14: 0.25 mg

## 2023-02-14 MED ORDER — LEVOTHYROXINE SODIUM 125 MCG PO TABS
125.0000 ug | ORAL_TABLET | Freq: Every day | ORAL | Status: DC
  Administered 2023-02-15 – 2023-02-20 (×6): 125 ug via JEJUNOSTOMY
  Filled 2023-02-14 (×6): qty 1

## 2023-02-14 MED ORDER — SALINE SPRAY 0.65 % NA SOLN
1.0000 | Freq: Every day | NASAL | Status: DC
  Administered 2023-02-14 – 2023-02-20 (×7): 1 via NASAL
  Filled 2023-02-14: qty 44

## 2023-02-14 MED ORDER — LORATADINE 10 MG PO TABS
10.0000 mg | ORAL_TABLET | Freq: Every day | ORAL | Status: DC
  Administered 2023-02-14 – 2023-02-20 (×7): 10 mg
  Filled 2023-02-14 (×8): qty 1

## 2023-02-14 NOTE — Progress Notes (Signed)
Pt getting agitated & pulling at wires. Mittens placed on pt.

## 2023-02-14 NOTE — Progress Notes (Signed)
Nutrition Follow-up  DOCUMENTATION CODES:   Severe malnutrition in context of chronic illness  INTERVENTION:  - Patient will need continuous tube feeds now that G tube exchanged for a J tube.  - As discussed with MD, will plan to change to [facility] and increase slowly to continuous goal of 40mL/hr. Initiate tube feeding via J-tube: [employer] 1.4 at 70 ml/h (1680 ml per day) *Start at 46mL/hr and advance by 10mL Q6H  Provides 2352 kcal, 103 gm protein, 1210 ml free water daily *note: regimen exceeds kcal/protein needs but is similar to what patient has been receiving and tolerating outpatient.  - Monitor magnesium, potassium, and phosphorus for at least 3 days, MD to replete as needed.  - FWF per CCM/MD.    - Would recommend Q4H as appropriate.  - Monitor weight trends.   NUTRITION DIAGNOSIS:   Severe Malnutrition related to chronic illness (cancer) as evidenced by severe fat depletion, severe muscle depletion. *new  GOAL:   Patient will meet greater than or equal to 90% of their needs *progressing, on TF   MONITOR:   Diet advancement, Labs, Weight trends, TF tolerance  REASON FOR ASSESSMENT:   Consult Assessment of nutrition requirement/status  ASSESSMENT:   82 year old female with PMHx of left-sided breast cancer, lung cancer, chronic diarrhea, metastatic medullary thyroid carcinoma, G-tube dependence admitted with multifocal PNA.  Met with patient, her son, and her daughter-in-law at bedside.  All assisted in providing nutrition history.    Son reports patient's UBW to be around 100# and he feels she has been able to gain a little weight recently. Per EMR, weight has been mostly stable betwee 90-96# over the past year.   Son reported home TF regimen as below: Osmolite 1.5 - 7 cartons daily Receives 5 feedings/day: 9a, 12p, 3p, 6p, and 9p free water flushes after each feed Provides 2485 kcal, 104g protein, total free water/day  Son  reports patient tolerates regimen well at home. Reports she sometimes has diarrhea and reflux but the reflux was occurring prior to starting TF. Notes she doesn't love her regimen simply due to it being a lot to due every day.   Patient is now s/p G tube conversion to J tube on 4/15. TF restarted at 78mL/hr this morning. Patient endorses tolerating well so far. Notes she has a tickle in her throat which makes her feel the need to cough. Increased HOB to help with possible reflux symptoms.   Patient discussed with outpatient RD who has been following her. Confirmed patient seems to be getting 7 cartons of Osmolite 1.5 daily at home. This regimen far exceeds her estimated calorie and protein needs however she has not been gaining excessive weight or even hardly any weight at all.  She has been having issues with ongoing diarrhea from some time. Suspect patient may benefit from a little fiber. Plan to trial [facility] 1.4 and see if patient tolerates better.   Discussed patient with MD and that she will now require continuous feeds with her new J tube. Dr. Caleb Popp on board to try [facility] and work towards getting her on cyclic feeds to allow time off the pump. Plan also discussed with MD.   Medications reviewed and include: Insulin  Labs reviewed:  K+ 3.0 HA1C 6.0   NUTRITION - FOCUSED PHYSICAL EXAM:  Flowsheet Row Most Recent Value  Orbital Region Severe depletion  Upper Arm Region Moderate depletion  Thoracic and Lumbar Region Moderate depletion  Buccal Region Severe depletion  Temple Region Severe depletion  Clavicle Bone Region Moderate depletion  Clavicle and Acromion Bone Region Severe depletion  Scapular Bone Region Unable to assess  Dorsal Hand Moderate depletion  Patellar Region Mild depletion  Anterior Thigh Region Mild depletion  Posterior Calf Region Moderate depletion  Edema (RD Assessment) Mild  Hair Reviewed  Eyes Reviewed  Mouth Reviewed  Skin Reviewed  Nails  Reviewed       Diet Order:   Diet Order             Diet NPO time specified Except for: Other (See Comments)  Diet effective now                   EDUCATION NEEDS:  Education needs have been addressed  Skin:  Skin Assessment: Reviewed RN Assessment  Last BM:  4/16 - rectal tube  Height:  Ht Readings from Last 1 Encounters:  02/11/23 5\' 4"  (1.626 m)   Weight:  Wt Readings from Last 1 Encounters:  02/14/23 42.3 kg   Ideal Body Weight:  54.5 kg  BMI:  Body mass index is 16.01 kg/m.  Estimated Nutritional Needs:  Kcal:  1650-1850 Protein:  70-85 grams Fluid:  >/= 1.6 L/day    Shelle Iron RD, LDN For contact information, refer to [facility].

## 2023-02-14 NOTE — Progress Notes (Signed)
PROGRESS NOTE    Shannon [name]  ZOX:[id number] DOB: [birth year] DOA: 02/10/2023 PCP: Richmond Campbell., PA-C   Brief Narrative: Shannon [name] is a 82 y.o. 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 Patient presented secondary to difficulty breathing and was found to have evidence of multifocal pneumonia in addition to respiratory distress and respiratory failure. Empiric antibiotics started, blood cultures obtained and supplemental oxygen/BiPAP provided. Likely etiology is aspiration. PEG tube converted to G-J tube.   Assessment and Plan:  Multifocal pneumonia Aspiration pneumonia Present on admission. No leukocytosis. No procalcitonin available. Blood cultures obtained on admission. Empiric Ceftriaxone and azithromycin started. Complicated by right sided pleural effusion, concerning for probably parapneumonic effusion. MRSA swab negative. Ceftriaxone/Azithromycin switched to Zosyn IV -Continue Zosyn IV -Continue supportive care -Sputum culture pending (not yet collected)  Severe sepsis Present on admission. Patient with tachycardia and tachypnea with associated respiratory distress and failure. Blood cultures obtained on admission and are no growth to date. Empiric antibiotics started on admission. Procalcitonin significantly elevated, but in setting of known cancer. -Continue Zosyn IV -Follow-up blood cultures  Acute respiratory failure with hypoxia Respiratory distress Secondary to multifocal pneumonia and complicated by right sided pleural effusion and pulmonary edema. Patient with significant respiratory effort on admission requiring BiPAP. Patient has been on BiPAP intermittently. Precedex started on 4/14 for agitation while on BiPAP; BiPAP now discontinued and patient transitioned to heated high flow nasal canula, currently requiring 30 L/min at 90% FiO2. Precedex  discontinued on 4/15. -Continue supplemental oxygen and/or BiPAP to keep O2 saturations >90% -Wean to room air as able -Change to stepdown level of care  Right pleural effusion Small-moderate in size. Possibly contributing to respiratory failure. Likely related to pneumonia and/or underlying cancer. Thoracentesis ordered and performed with a total of 500 mL of hazy yellow fluid aspirated. Preliminary cell count with an elevated WBC of 1,894, predominantly neutrophils. Light's criteria met on LDH ratio alone. No organisms noted on fluid culture to date and no organisms on gram stain. -Follow-up pleural fluid culture and pathology (4/13)  Pulmonary edema Noted on imaging with associated respiratory failure -Watch urine output  Acute combined systolic and diastolic heart failure Transthoracic Echocardiogram obtained on 4/14 and is significant for an LVEF of 45-50% with associated grade 1 diastolic dysfunction and no regional wall motion abnormality. Patient treated with thoracentesis and Lasix with improvement. -Watch for ongoing acute signs/symptoms -Daily weights and strict in/out  Urinary retention Patient required in/out catheterization overnight with 500 mL output. Foley catheter placed on 4/14. -Continue foley catheter -Attempt voiding trial prior to discharge if able  Hypokalemia Potassium down to 3.0 today -Continue to supplement potassium  Hypothyroidism -Continue Synthroid  Chronic diarrhea Possible related to tube feeds. Stable.  Chronic oropharyngeal dysphagia Underweight Dysphagia related to prior neck surgery. PEG tube in place. Body mass index is 16.01 kg/m. PEG has had some drainage per family. Surrounding skin appears erythematous and irritated likely from leaking. Good positioning on x-ray. Evidence of significant reflux noted on CT imaging. IR consulted and converted G tube to J tube on 4/15. -Resume tube feedings -SLP consult per patient request  Medullary  thyroid cancer Patient follows with Dr. Candise Che as an outpatient. Not currently on therapy.  Breast cancer Lung cancer Noted history.  DVT prophylaxis: Lovenox Code Status:   Code Status: Full Code Family Communication: Sister in law at bedside Disposition Plan: Discharge pending ability  to wean oxygen if able, continued treatment of pneumonia. Anticipate discharge in 5+ days   Consultants:  Palliative care medicine PCCM  Procedures:  4/13: Right thoracentesis 4/14: Conversion of gastrostomy to single-lumen gastrojejunostomy catheter  Antimicrobials: Ceftriaxone Azithromycin Zosyn IV   Subjective: Patient with anxiety overnight. No dyspnea.  Objective: BP (!) 161/73 (BP Location: Left Arm)   Pulse (!) 110   Temp 98.1 F (36.7 C) (Oral)   Resp (!) 33   Ht 5\' 4"  (1.626 m)   Wt 42.3 kg   SpO2 93%   BMI 16.01 kg/m   Examination:  General exam: Appears calm and comfortable. Thin/frail appearing Respiratory system: Rales on auscultation. Respiratory effort normal Cardiovascular system: S1 & S2 heard, tachycardia, normal rhythm Gastrointestinal system: Abdomen is nondistended, soft and nontender. Normal bowel sounds heard. Central nervous system: Alert and oriented. Musculoskeletal: No edema. No calf tenderness Psychiatry: Judgement and insight appear normal. Mood & affect appropriate.    Data Reviewed: I have personally reviewed following labs and imaging studies  CBC Lab Results  Component Value Date   WBC 13.3 (H) 02/14/2023   RBC 5.24 (H) 02/14/2023   HGB 13.7 02/14/2023   HCT 43.2 02/14/2023   MCV 82.4 02/14/2023   MCH 26.1 02/14/2023   PLT 347 02/14/2023   MCHC 31.7 02/14/2023   RDW 16.7 (H) 02/14/2023   LYMPHSABS 0.4 (L) 02/14/2023   MONOABS 0.6 02/14/2023   EOSABS 0.1 02/14/2023   BASOSABS 0.1 02/14/2023     Last metabolic panel Lab Results  Component Value Date   NA 138 02/14/2023   K 3.0 (L) 02/14/2023   CL 100 02/14/2023   CO2 24  02/14/2023   BUN 12 02/14/2023   CREATININE 0.60 02/14/2023   GLUCOSE 129 (H) 02/14/2023   GFRNONAA >60 02/14/2023   GFRAA >60 06/22/2020   CALCIUM 8.0 (L) 02/14/2023   PHOS 2.9 02/12/2023   PROT 6.3 (L) 02/12/2023   ALBUMIN 2.5 (L) 02/12/2023   BILITOT 0.6 02/12/2023   ALKPHOS 111 02/12/2023   AST 25 02/12/2023   ALT 27 02/12/2023   ANIONGAP 14 02/14/2023    GFR: Estimated Creatinine Clearance: 36.2 mL/min (by C-G formula based on SCr of 0.6 mg/dL).  Recent Results (from the past 240 hour(s))  SARS Coronavirus 2 by RT PCR (hospital order, performed in [facility] hospital lab) *cepheid single result test* Anterior Nasal Swab     Status: None   Collection Time: 02/10/23  1:17 PM   Specimen: Anterior Nasal Swab  Result Value Ref Range Status   SARS Coronavirus 2 by RT PCR NEGATIVE NEGATIVE Final    Comment: (NOTE) SARS-CoV-2 target nucleic acids are NOT DETECTED.  The SARS-CoV-2 RNA is generally detectable in upper and lower respiratory specimens during the acute phase of infection. The lowest concentration of SARS-CoV-2 viral copies this assay can detect is 250 copies / mL. A negative result does not preclude SARS-CoV-2 infection and should not be used as the sole basis for treatment or other patient management decisions.  A negative result may occur with improper specimen collection / handling, submission of specimen other than nasopharyngeal swab, presence of viral mutation(s) within the areas targeted by this assay, and inadequate number of viral copies (<250 copies / mL). A negative result must be combined with clinical observations, patient history, and epidemiological information.  Fact Sheet for Patients:   RoadLapTop.co.za  Fact Sheet for Healthcare Providers: http://kim-miller.com/  This test is not yet approved or  cleared by the [place] FDA and has been authorized for detection and/or diagnosis of SARS-CoV-2  by FDA under an Emergency Use Authorization (EUA).  This EUA will remain in effect (meaning this test can be used) for the duration of the COVID-19 declaration under Section 564(b)(1) of the Act, 21 U.S.C. section 360bbb-3(b)(1), unless the authorization is terminated or revoked sooner.  Performed at [facility], [address], [place], [place] [address]   Blood Culture (routine x 2)     Status: None (Preliminary result)   Collection Time: 02/10/23  1:17 PM   Specimen: BLOOD  Result Value Ref Range Status   Specimen Description   Final    BLOOD RIGHT ANTECUBITAL Performed at [facility], [address], [place], [place] [address]    Special Requests   Final    BOTTLES DRAWN AEROBIC AND ANAEROBIC Blood Culture adequate volume Performed at [facility], [address] [place], [place] [address]    Culture   Final    NO GROWTH 4 DAYS Performed at [facility], [address] [place], [place] [address]    Report Status PENDING  Incomplete  Body fluid culture w Gram Stain     Status: None (Preliminary result)   Collection Time: 02/11/23 12:50 PM   Specimen: PATH Cytology Pleural fluid  Result Value Ref Range Status   Specimen Description PLEURAL FLUID RIGHT  Final   Special Requests SYRINGE  Final   Gram Stain NO ORGANISMS SEEN  Final   Culture   Final    NO GROWTH 2 DAYS Performed at [facility], [address] [place], [place] [address]    Report Status PENDING  Incomplete  MRSA Next Gen by PCR, Nasal     Status: None   Collection Time: 02/11/23  6:13 PM   Specimen: Nasal Mucosa; Nasal Swab  Result Value Ref Range Status   MRSA by PCR Next Gen NOT DETECTED NOT DETECTED Final    Comment: (NOTE) The GeneXpert MRSA Assay (FDA approved for NASAL specimens only), is one component of a comprehensive MRSA colonization surveillance program. It is not intended to diagnose MRSA infection nor to guide or  monitor treatment for MRSA infections. Test performance is not FDA approved in patients less than 72 years old. Performed at [facility], [address] [place], [place] [address]   Respiratory (~20 pathogens) panel by PCR     Status: None   Collection Time: 02/11/23  6:14 PM   Specimen: Nasopharyngeal Swab; Respiratory  Result Value Ref Range Status   Adenovirus NOT DETECTED NOT DETECTED Final   Coronavirus 229E NOT DETECTED NOT DETECTED Final    Comment: (NOTE) The Coronavirus on the Respiratory Panel, DOES NOT test for the novel  Coronavirus (2019 nCoV)    Coronavirus HKU1 NOT DETECTED NOT DETECTED Final   Coronavirus NL63 NOT DETECTED NOT DETECTED Final   Coronavirus OC43 NOT DETECTED NOT DETECTED Final   Metapneumovirus NOT DETECTED NOT DETECTED Final   Rhinovirus / Enterovirus NOT DETECTED NOT DETECTED Final   Influenza A NOT DETECTED NOT DETECTED Final   Influenza B NOT DETECTED NOT DETECTED Final   Parainfluenza Virus 1 NOT DETECTED NOT DETECTED Final   Parainfluenza Virus 2 NOT DETECTED NOT DETECTED Final   Parainfluenza Virus 3 NOT DETECTED NOT DETECTED Final   Parainfluenza Virus 4 NOT DETECTED NOT DETECTED Final   Respiratory Syncytial Virus NOT DETECTED NOT DETECTED Final   Bordetella pertussis NOT  DETECTED NOT DETECTED Final   Bordetella Parapertussis NOT DETECTED NOT DETECTED Final   Chlamydophila pneumoniae NOT DETECTED NOT DETECTED Final   Mycoplasma pneumoniae NOT DETECTED NOT DETECTED Final    Comment: Performed at [facility], [address] [place], [place] [address]      Radiology Studies: DG Chest Port 1 View  Result Date: 02/14/2023 CLINICAL DATA:  66501 Respiratory failure [id number] Chest pain [id number] EXAM: PORTABLE CHEST 1 VIEW COMPARISON:  Radiograph 02/11/2023 FINDINGS: Unchanged cardiomediastinal silhouette. There is increased, mid to lower lung predominant airspace disease bilaterally, right greater than left. Small  pleural effusions, right greater than left. No evidence of pneumothorax. Unchanged surgical clips overlying the lower neck. Bones are unchanged. IMPRESSION: Increased bilateral airspace disease, right greater left, with small pleural effusions. Electronically Signed   By: Jacob  Kahn M.D.   On: 02/14/2023 08:08   IR Replc Duoden/Jejuno Tube Percut W/Fluoro  Result Date: 02/13/2023 INDICATION: Long-term indwelling gastrostomy catheter, aspiration pneumonia, reflux, conversion into a post pyloric catheter requested EXAM: CONVERT GASTROSTOMY TO GASTROJEJUNOSTOMY CATHETER UNDER FLUOROSCOPY MEDICATIONS: no periprocedural antibiotics were indicated ANESTHESIA/SEDATION: Viscous lidocaine topical CONTRAST:  10 mL Omnipaque 300-administered into the gastric lumen. FLUOROSCOPY: Radiation Exposure Index (as provided by the fluoroscopic device): 10 mGy Kerma COMPLICATIONS: None immediate. PROCEDURE: Informed written consent was obtained from the patient after a thorough discussion of the procedural risks, benefits and alternatives. All questions were addressed. Maximal Sterile Barrier Technique was utilized including caps, mask, sterile gowns, sterile gloves, sterile drape, hand hygiene and skin antiseptic. A timeout was performed prior to the initiation of the procedure. Small contrast injection through the previously placed gastrostomy catheter opacified the decompressed lumen of the stomach. Stiff angled Glidewire was advanced to the pylorus. Gastrostomy tube was exchanged for a 5 French angled angiographic catheter, advanced to ligament of Treitz. Catheter was exchanged over the guidewire for a 18 French single-lumen gastrojejunostomy catheter, positioned with its tip at the ligament of Treitz. Contrast injection confirms good positioning and patency. The retention balloon was inflated with 10 mL sterile saline within the gastric lumen. The patient tolerated the procedure well. IMPRESSION: Technically successful  conversion of gastrostomy to single-lumen gastrojejunostomy catheter, okay for routine post pyloric use. Electronically Signed   By: D  Hassell M.D.   On: 02/13/2023 14:19   CT Angio Chest Pulmonary Embolism (PE) W or WO Contrast  Result Date: 02/12/2023 CLINICAL DATA:  Acute hypoxic respiratory failure. Multifocal pneumonia. Bowel obstruction suspected. EXAM: CT ANGIOGRAPHY CHEST CT ABDOMEN AND PELVIS WITH CONTRAST TECHNIQUE: Multidetector CT imaging of the chest was performed using the standard protocol during bolus administration of intravenous contrast. Multiplanar CT image reconstructions and MIPs were obtained to evaluate the vascular anatomy. Multidetector CT imaging of the abdomen and pelvis was performed using the standard protocol during bolus administration of intravenous contrast. RADIATION DOSE REDUCTION: This exam was performed according to the departmental dose-optimization program which includes automated exposure control, adjustment of the mA and/or kV according to patient size and/or use of iterative reconstruction technique. CONTRAST:  <[id number]>A 51etA 49etA 8etA 77etA 27etA 85etA 39etA 77etArnette NorrisfordHEXOL 350 MG/ML SOLN COMPARISON:  Chest abdomen and pelvis CT dated 06/18/2019. Chest CT dated 06/22/2020. CT thoracic and lumbar spine dated 01/16/2023. PET-CT dated 04/06/2022. FINDINGS: Cardiovascular: There is no pulmonary embolism identified within the main, lobar or segmental pulmonary arteries of either lung. No thoracic aortic aneurysm or evidence of aortic dissection. No clinically significant pericardial effusion. Mediastinum/Nodes: No mass or enlarged lymph nodes are seen within the mediastinum or perihilar regions.  Esophagus appears to be filled with fluid/debris to the level of the mid trachea. Trachea and central bronchi are unremarkable. Lungs/Pleura: Extensive biapical pleural-parenchymal scarring with associated architectural distortion, not significantly changed compared to the chest CT of 06/22/2020. Bibasilar consolidations,  RIGHT greater than LEFT. Smaller patchy consolidations within the lingula and RIGHT middle lobe. Focal ground-glass opacity within the anterior aspect of the RIGHT upper lobe. Small bilateral pleural effusions. Musculoskeletal: Widespread osseous metastases, as previously described. Review of the MIP images confirms the above findings. CT ABDOMEN and PELVIS FINDINGS Hepatobiliary: Stable hypodense lesions within the LEFT liver lobe, consistent with benign cysts. New small hypodense lesion within the upper aspect of the RIGHT liver lobe (series 12, image 16), cyst versus early metastasis. Gallbladder appears normal. No significant bile duct dilatation seen. Pancreas: Unremarkable. No pancreatic ductal dilatation or surrounding inflammatory changes. Spleen: Normal in size without focal abnormality. Adrenals/Urinary Tract: Adrenal glands are unremarkable. Kidneys are unremarkable without suspicious mass, stone or hydronephrosis. No ureteral or bladder calculi identified. Bladder decompressed by Foley catheter. Stomach/Bowel: No dilated large or small bowel loops. Gastrostomy tube appears appropriately positioned within the body of the stomach. Vascular/Lymphatic: No abdominal aortic aneurysm. No acute-appearing vascular abnormality. No enlarged lymph nodes are identified in the abdomen or pelvis. Reproductive: No adnexal mass or free fluid seen. Other: No free fluid or abscess collection. No free intraperitoneal air. Musculoskeletal: Widespread osseous metastases, as previously described. Review of the MIP images confirms the above findings. IMPRESSION: 1. Bibasilar consolidations, RIGHT greater than LEFT, compatible with multifocal pneumonia and/or aspiration. Favor aspiration as the esophagus is filled with fluid/debris to the level of the upper mediastinum predisposing the patient to potential aspirations. 2. Small bilateral pleural effusions. 3. Widespread osseous metastases, as previously described. 4. New small  hypodense lesion within the upper aspect of the RIGHT liver lobe, additional cyst versus early metastasis (2 benign cysts within the LEFT liver lobe). Recommend nonemergent liver MRI for further characterization if clinically needed. 5. No bowel obstruction. No evidence of acute solid organ abnormality within the abdomen or pelvis. No renal or ureteral calculi. 6. Gastrostomy tube appears appropriately positioned within the body of the stomach. Electronically Signed   By: Bary Richard M.D.   On: 02/12/2023 13:25   CT ABDOMEN PELVIS W CONTRAST  Result Date: 02/12/2023 CLINICAL DATA:  Acute hypoxic respiratory failure. Multifocal pneumonia. Bowel obstruction suspected. EXAM: CT ANGIOGRAPHY CHEST CT ABDOMEN AND PELVIS WITH CONTRAST TECHNIQUE: Multidetector CT imaging of the chest was performed using the standard protocol during bolus administration of intravenous contrast. Multiplanar CT image reconstructions and MIPs were obtained to evaluate the vascular anatomy. Multidetector CT imaging of the abdomen and pelvis was performed using the standard protocol during bolus administration of intravenous contrast. RADIATION DOSE REDUCTION: This exam was performed according to the departmental dose-optimization program which includes automated exposure control, adjustment of the mA and/or kV according to patient size and/or use of iterative reconstruction technique. CONTRAST:  OMNIPAQUE IOHEXOL 350 MG/ML SOLN COMPARISON:  Chest abdomen and pelvis CT dated 06/18/2019. Chest CT dated 06/22/2020. CT thoracic and lumbar spine dated 01/16/2023. PET-CT dated 04/06/2022. FINDINGS: Cardiovascular: There is no pulmonary embolism identified within the main, lobar or segmental pulmonary arteries of either lung. No thoracic aortic aneurysm or evidence of aortic dissection. No clinically significant pericardial effusion. Mediastinum/Nodes: No mass or enlarged lymph nodes are seen within the mediastinum or perihilar regions.  Esophagus appears to be filled with fluid/debris to the level of the mid trachea. Trachea  and central bronchi are unremarkable. Lungs/Pleura: Extensive biapical pleural-parenchymal scarring with associated architectural distortion, not significantly changed compared to the chest CT of 06/22/2020. Bibasilar consolidations, RIGHT greater than LEFT. Smaller patchy consolidations within the lingula and RIGHT middle lobe. Focal ground-glass opacity within the anterior aspect of the RIGHT upper lobe. Small bilateral pleural effusions. Musculoskeletal: Widespread osseous metastases, as previously described. Review of the MIP images confirms the above findings. CT ABDOMEN and PELVIS FINDINGS Hepatobiliary: Stable hypodense lesions within the LEFT liver lobe, consistent with benign cysts. New small hypodense lesion within the upper aspect of the RIGHT liver lobe (series 12, image 16), cyst versus early metastasis. Gallbladder appears normal. No significant bile duct dilatation seen. Pancreas: Unremarkable. No pancreatic ductal dilatation or surrounding inflammatory changes. Spleen: Normal in size without focal abnormality. Adrenals/Urinary Tract: Adrenal glands are unremarkable. Kidneys are unremarkable without suspicious mass, stone or hydronephrosis. No ureteral or bladder calculi identified. Bladder decompressed by Foley catheter. Stomach/Bowel: No dilated large or small bowel loops. Gastrostomy tube appears appropriately positioned within the body of the stomach. Vascular/Lymphatic: No abdominal aortic aneurysm. No acute-appearing vascular abnormality. No enlarged lymph nodes are identified in the abdomen or pelvis. Reproductive: No adnexal mass or free fluid seen. Other: No free fluid or abscess collection. No free intraperitoneal air. Musculoskeletal: Widespread osseous metastases, as previously described. Review of the MIP images confirms the above findings. IMPRESSION: 1. Bibasilar consolidations, RIGHT greater than  LEFT, compatible with multifocal pneumonia and/or aspiration. Favor aspiration as the esophagus is filled with fluid/debris to the level of the upper mediastinum predisposing the patient to potential aspirations. 2. Small bilateral pleural effusions. 3. Widespread osseous metastases, as previously described. 4. New small hypodense lesion within the upper aspect of the RIGHT liver lobe, additional cyst versus early metastasis (2 benign cysts within the LEFT liver lobe). Recommend nonemergent liver MRI for further characterization if clinically needed. 5. No bowel obstruction. No evidence of acute solid organ abnormality within the abdomen or pelvis. No renal or ureteral calculi. 6. Gastrostomy tube appears appropriately positioned within the body of the stomach. Electronically Signed   By: Bary Richard M.D.   On: 02/12/2023 13:25      LOS: 4 days    Jacquelin Hawking, MD [facility] 02/14/2023, 8:45 AM   If 7PM-7AM, please contact night-coverage [email or web address]

## 2023-02-14 NOTE — Evaluation (Signed)
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 DUCTAL CARCINOMA IN SITU./ RADIATION   CATARACT EXTRACTION W/ INTRAOCULAR LENS IMPLANT  2009   BOTH EYES   FEMUR IM NAIL Left 10/23/2017   Procedure: INTRAMEDULLARY (IM) NAIL FEMORAL;  Surgeon: Durene Romans, MD;  Location: [facility] OR;  Service: Orthopedics;  Laterality: Left;   FOOT SURGERY  1070   RIGHT FOOT - PINCHED NERVE   IR GASTROSTOMY TUBE MOD SED  01/13/2021   IR REPLACE G-TUBE SIMPLE WO FLUORO  03/08/2022   IR REPLACE G-TUBE SIMPLE WO FLUORO  11/01/2022   IR REPLC DUODEN/JEJUNO TUBE PERCUT W/FLUORO  02/13/2023   IR REPLC GASTRO/COLONIC TUBE PERCUT W/FLUORO  05/17/2022   LUNG SURGERY     CALCIFICATIONS, POSITIVE - CANCER LYMPHNODE    MELANOMA EXCISION  2008   CHEST AREA   NECK SURGERY  Nov 09 2011   LYMPHNODES REMOVED 2 POSITIVE    RIGHT COLECTOMY  06/02/2009   THYROIDECTOMY  11/06/2009   TONSILLECTOMY AND ADENOIDECTOMY     HPI:  Patient is an 82 y.o. female with PMH: medullary thyroid cancer, oropharyngeal dysphagia s/p PEG, L breast cancer, lung cancer who presented to [facility] with multifocal PNA, requring HFNC and started on antibiotics. She developed progressive agitation/intoleration for NIV secondary to  agitated delirium and was started on Precedex. Of note, her spouse of 60 years passed away last month which has understandably been difficult for her.    Assessment / Plan / Recommendation  Clinical Impression  Patient presents with clinical s/s of dysphagia as per this bedsides swallow evaluation. She has a long h/o dysphagia and at baseline she gets main nutrition via PEG but drinks some liquids and eats some soft foods mainly for pleasure. During today's evaluation, patient did state that her goal is to be able to eat some "food" (referring to liquids, puddings, yogurts, etc). After oral care, SLP assessed her toleration of thin liquids (water) and puree solids (applesauce). She exhibited suspected swallow initiation delay and decreased hyolaryngeal movement but no overt s/s aspiration or penetration, no changes in vitals or voice. SLP recommending to continue NPO status but allow patient PRN sips of water or ice chips. MBS recommended to objectively assess swallow function prior to any other PO recommendations. SLP Visit Diagnosis: Dysphagia, unspecified (R13.10)    Aspiration Risk  Moderate aspiration risk    Diet Recommendation NPO;Free water protocol after oral care   Liquid Administration via: Cup;Straw Medication Administration: Via alternative means Compensations: Slow rate;Small sips/bites Postural Changes: Seated upright at 90 degrees    Other  Recommendations Oral Care Recommendations: Oral care BID;Staff/trained caregiver to provide oral care;Oral care prior to ice chip/H20  Recommendations for follow up therapy are one component of a multi-disciplinary discharge planning process, led by the attending physician.  Recommendations may be updated based on patient status, additional functional criteria and insurance authorization.  Follow up Recommendations Follow physician's recommendations for discharge plan and follow up therapies      Assistance Recommended at Discharge     Functional Status Assessment Patient has had a recent decline in their functional status and demonstrates the ability to make significant improvements in function in a reasonable and predictable amount of time.  Frequency and Duration min 2x/week  2 weeks       Prognosis Prognosis for improved oropharyngeal function: Fair Barriers to Reach Goals: Time post onset;Severity of deficits      Swallow Study   General Date of Onset: 02/10/23 HPI: Patient is an 82 y.o. female with PMH: medullary thyroid cancer, oropharyngeal dysphagia s/p PEG, L breast cancer, lung cancer who presented to [facility] with multifocal PNA, requring HFNC and started on antibiotics. She developed progressive agitation/intoleration for NIV secondary to agitated delirium and was started on Precedex. Of note, her spouse of 60 years passed away last month which has understandably been difficult for her. Type of Study: Bedside Swallow Evaluation Previous Swallow Assessment: MBS in 2021 Diet Prior to this Study: NPO Temperature Spikes Noted: No Respiratory Status: Nasal cannula History of Recent Intubation: No Behavior/Cognition: Alert;Cooperative;Pleasant mood Oral Cavity Assessment: Dry Oral Care Completed by SLP: Yes Oral Cavity - Dentition: Adequate natural dentition Vision: Functional for self-feeding Self-Feeding Abilities: Needs assist;Needs set up Patient Positioning: Upright in bed Baseline Vocal Quality: Low vocal intensity Volitional Cough: Weak Volitional Swallow: Able to elicit    Oral/Motor/Sensory Function Overall Oral Motor/Sensory Function: Generalized oral weakness   Ice Chips     Thin Liquid Thin Liquid: Impaired Presentation: Straw;Self Fed Pharyngeal  Phase Impairments: Decreased hyoid-laryngeal movement;Suspected delayed Swallow    Nectar Thick     Honey Thick     Puree Puree: Impaired Pharyngeal Phase Impairments: Decreased hyoid-laryngeal movement   Solid     Solid: Not tested       Angela Nevin, MA, CCC-SLP Speech Therapy

## 2023-02-14 NOTE — Progress Notes (Signed)
NAME:  Shannon WASSENAAR, MRN:  [id number], DOB:  11-22-1940, LOS: 4 ADMISSION DATE:  02/10/2023, CONSULTATION DATE: 4/14 REFERRING MD: Dr. Caleb Popp, CHIEF COMPLAINT: Agitated delirium  History of Present Illness:  82 y/o F with PMH hypothyroidism with medullary thyroid cancer, oropharyngeal dysphagia s/p peg, L breast cancer, lung cancer presented to [facility] with multifocal pneumonia, started on abx req high flow [place] and intermittent NIV. Developed progressive agitation / intolerance for NIV 2/2 agitated delirium precedex was started and as such CCM was consulted for management of gtt.     Pt is drowsy on NIV and unable to get ROS.  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   Cancer    Colon polyp    Contact lens/glasses fitting    HAS LENS IMPLANTS   Lung cancer 2015   LYMPHNODE REMOVAL    Medullary carcinoma of thyroid 2011   Osteoporosis    Significant Hospital Events: Including procedures, antibiotic start and stop dates in addition to other pertinent events   4/12 Admit with multifocal PNA, precedex initiated for agitated delirium  4/15 HFNC, awake/alert   Interim History / Subjective:  Afebrile  I/O 1.3L UOP, +406 in last 24 hours HHFNC 30L flow/90% FiO2 Pt reports runny nose, sneezing  Objective   Blood pressure (!) 161/73, pulse (!) 110, temperature 98.1 F (36.7 C), temperature source Oral, resp. rate (!) 33, height 5\' 4"  (1.626 m), weight 42.3 kg, SpO2 93 %.    FiO2 (%):  [90 %-91 %] 90 %   Intake/Output Summary (Last 24 hours) at 02/14/2023 0852 Last data filed at 02/14/2023 0630 Gross per 24 hour  Intake 1594.68 ml  Output 2045 ml  Net -450.32 ml   Filed Weights   02/10/23 1314 02/11/23 0008 02/14/23 0519  Weight: 43.1 kg 38.2 kg 42.3 kg    Examination: General: frail elderly female sitting up in bed, visiting with Daughter in Law HEENT: MM pink/moist,  [place] O2, anicteric, scant bloody on tissue when blowing nose/otherwise clear Neuro: Awake, alert /pleasant, MAE, generalized weakness  CV: s1s2 RRR, occ PAC's, no m/r/g PULM: non-labored at rest, lungs bilaterally clear  GI: soft, bsx4 active  Extremities: warm/dry, no edema  Skin: no rashes or lesions  Pleural Fluid 4/13 >  BC 4/12 >   Resolved Hospital Problem list     Assessment & Plan:   Acute Hypoxemic Respiratory Failure secondary to PNA -wean O2 for sats >90% -continue heated high flow for now  -PRN biPAP for increased work of breathing   Multifocal Pneumonia / CAP  -follow up cultures -continue zosyn  -pulmonary hygiene - IS, mobilize  -follow CXR   Parapneumonic Right Pleural Effusion  Thora on 4/13 with LD 97, TNC 1,894, neutrophil predominant c/w parapneumonic process, 500 ml drained  -reassess with [place] on 4/16  -abx as above   Sepsis (POA) in setting of PNA -abx as above  -Continue empiric antibiotics  Agitated Delirium / Acute Metabolic Encephalopathy -off precedex, appears to be at baseline mental status  Medullary Thyroid Carcinoma -follow up with ONC at discharge, Dr. Candise Che   Hx Breast, Lung CA -supportive care    Best Practice (right click and "Reselect all SmartList Selections" daily)  Diet/type: NPO DVT prophylaxis: LMWH GI prophylaxis: N/A Lines: N/A Foley:  N/A Code Status:  full code Last date of multidisciplinary goals of care discussion [discussed  with son]     Canary Brim, MSN, APRN, NP-C, AGACNP-BC [facility] 02/14/2023, 8:52 AM   Please see [email or web address] for pager details.   From 7A-7P if no response, please call [phone] After hours, please call ELink [phone]

## 2023-02-14 NOTE — Progress Notes (Signed)
eLink Physician-Brief Progress Note Patient Name: Shannon [name] DOB: 11/09/1940 MRN: [id number]   Date of Service  02/14/2023  HPI/Events of Note  Patient is a bit anxious, she takes Xanax at home.  eICU Interventions  Xanax 0.25 mg via G-tube x 1 dose.        Thomasene Lot Hazim Treadway 02/14/2023, 1:36 AM

## 2023-02-15 DIAGNOSIS — J189 Pneumonia, unspecified organism: Secondary | ICD-10-CM | POA: Diagnosis not present

## 2023-02-15 DIAGNOSIS — R410 Disorientation, unspecified: Secondary | ICD-10-CM | POA: Diagnosis not present

## 2023-02-15 DIAGNOSIS — J9601 Acute respiratory failure with hypoxia: Secondary | ICD-10-CM | POA: Diagnosis not present

## 2023-02-15 DIAGNOSIS — E43 Unspecified severe protein-calorie malnutrition: Secondary | ICD-10-CM | POA: Insufficient documentation

## 2023-02-15 DIAGNOSIS — J90 Pleural effusion, not elsewhere classified: Secondary | ICD-10-CM | POA: Diagnosis not present

## 2023-02-15 LAB — GLUCOSE, CAPILLARY
Glucose-Capillary: 112 mg/dL — ABNORMAL HIGH (ref 70–99)
Glucose-Capillary: 122 mg/dL — ABNORMAL HIGH (ref 70–99)
Glucose-Capillary: 130 mg/dL — ABNORMAL HIGH (ref 70–99)
Glucose-Capillary: 135 mg/dL — ABNORMAL HIGH (ref 70–99)
Glucose-Capillary: 142 mg/dL — ABNORMAL HIGH (ref 70–99)
Glucose-Capillary: 157 mg/dL — ABNORMAL HIGH (ref 70–99)
Glucose-Capillary: 159 mg/dL — ABNORMAL HIGH (ref 70–99)

## 2023-02-15 LAB — CBC
HCT: 45.4 % (ref 36.0–46.0)
Hemoglobin: 13.6 g/dL (ref 12.0–15.0)
MCH: 25.6 pg — ABNORMAL LOW (ref 26.0–34.0)
MCHC: 30 g/dL (ref 30.0–36.0)
MCV: 85.5 fL (ref 80.0–100.0)
Platelets: 309 10*3/uL (ref 150–400)
RBC: 5.31 MIL/uL — ABNORMAL HIGH (ref 3.87–5.11)
RDW: 16.8 % — ABNORMAL HIGH (ref 11.5–15.5)
WBC: 13.5 10*3/uL — ABNORMAL HIGH (ref 4.0–10.5)
nRBC: 0 % (ref 0.0–0.2)

## 2023-02-15 LAB — BASIC METABOLIC PANEL
Anion gap: 8 (ref 5–15)
BUN: 14 mg/dL (ref 8–23)
CO2: 25 mmol/L (ref 22–32)
Calcium: 8.4 mg/dL — ABNORMAL LOW (ref 8.9–10.3)
Chloride: 107 mmol/L (ref 98–111)
Creatinine, Ser: 0.7 mg/dL (ref 0.44–1.00)
GFR, Estimated: >60 mL/min (ref >60)
Glucose, Bld: 141 mg/dL — ABNORMAL HIGH (ref 70–99)
Potassium: 3.7 mmol/L (ref 3.5–5.1)
Sodium: 140 mmol/L (ref 135–145)

## 2023-02-15 LAB — BODY FLUID CULTURE W GRAM STAIN

## 2023-02-15 LAB — CULTURE, BLOOD (ROUTINE X 2)
Culture: NO GROWTH
Special Requests: ADEQUATE

## 2023-02-15 MED ORDER — ONDANSETRON HCL 4 MG PO TABS: 8.0000 mg | ORAL_TABLET | Freq: Three times a day (TID) | ORAL | Status: DC | PRN

## 2023-02-15 MED ORDER — LOPERAMIDE HCL 2 MG PO CAPS
4.0000 mg | ORAL_CAPSULE | Freq: Three times a day (TID) | ORAL | Status: DC
  Administered 2023-02-16 – 2023-02-20 (×18): 4 mg via ORAL
  Filled 2023-02-15 (×18): qty 2

## 2023-02-15 MED ORDER — LOPERAMIDE HCL 2 MG PO CAPS
2.0000 mg | ORAL_CAPSULE | Freq: Three times a day (TID) | ORAL | Status: DC
  Administered 2023-02-15 (×3): 2 mg via ORAL
  Filled 2023-02-15 (×3): qty 1

## 2023-02-15 NOTE — Progress Notes (Signed)
Speech Language Pathology Treatment: Dysphagia  Patient Details Name: Shannon [name] MRN: [id number] DOB: 11/23/1940 Today's Date: 02/15/2023 Time: 4098-1191 SLP Time Calculation (min) (ACUTE ONLY): 13 min  Assessment / Plan / Recommendation Clinical Impression  Pt seen to address dysphagia goals - Today pt remains in HHFNC at 40 Liters and was greeted sitting in bed with her neck extended requiring physical assist to move head to neutral position.  Son reports pt had just received Xanax and was resting.  Pt was xerostomic and agreeable to consume a small amount of water but required SLP to administer to her.  Provided water via tsp x2 boluses and cup x1 bolus only - bolus from cup followed by delayed weak coughing concerning for airway infiltration.    Pt's voice and cough are grossly weak - near whisper resulting in impaired airway protection and intelligibility respectively.    Pt endorses having XRT for her thyroid cancer but does not recall if she had dysphagia during or after treatment. Via yes,no pt acknowledges she received a PEG for nutrition due to her dysphagia preventing adequate nutrition.      At this time due to pt's gross weakness, respiratory status, h/o dysphagia, recommend pt continue water by mouth and defer MBS until her breathing is better.    Son and pt agreeable to plan.    HPI HPI: Patient is an 82 y.o. female with PMH: medullary thyroid cancer, oropharyngeal dysphagia s/p PEG, L breast cancer, lung cancer who presented to [facility] with multifocal PNA, requring HFNC and started on antibiotics. She developed progressive agitation/intoleration for NIV secondary to agitated delirium and was started on Precedex. Of note, her spouse of 60 years passed away last month which has understandably been difficult for her.  Patient had G-tube changed to J tube to decrease aspiration risk.  It was recommended she be n.p.o. except water as of evaluation on April 16.  Follow-up to assess readiness  for MBS.      SLP Plan         Recommendations for follow up therapy are one component of a multi-disciplinary discharge planning process, led by the attending physician.  Recommendations may be updated based on patient status, additional functional criteria and insurance authorization.    Recommendations  Diet recommendations: NPO (water and single small ice chip) Liquids provided via: Cup;Straw;Teaspoon Medication Administration: Via alternative means Compensations: Slow rate;Small sips/bites Postural Changes and/or Swallow Maneuvers: Seated upright 90 degrees;Upright 30-60 min after meal                  Oral care BID;Staff/trained caregiver to provide oral care;Oral care prior to ice chip/H20                Rolena Infante, MS [facility] SLP Acute Rehab Services Office [phone]  Chales Abrahams  02/15/2023, 1:46 PM

## 2023-02-15 NOTE — Progress Notes (Signed)
NAME:  Shannon [name], MRN:  [id number], DOB:  Apr 26, 1941, LOS: 5 ADMISSION DATE:  02/10/2023, CONSULTATION DATE: 4/14 REFERRING MD: Dr. Caleb Popp, CHIEF COMPLAINT: Agitated delirium  History of Present Illness:  82 y/o F with PMH hypothyroidism with medullary thyroid cancer, oropharyngeal dysphagia s/p peg, L breast cancer, lung cancer presented to [facility] with multifocal pneumonia, started on abx req high flow [place] and intermittent NIV. Developed progressive agitation / intolerance for NIV 2/2 agitated delirium precedex was started and as such CCM was consulted for management of gtt.      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   Objective   Blood pressure 139/74, pulse (!) 113, temperature 98.5 F (36.9 C), temperature source Oral, resp. rate (!) 23, height  (1.626 m), weight 42.3 kg, SpO2 94 %.    FiO2 (%):  [90 %] 90 %   Intake/Output Summary (Last 24 hours) at 02/15/2023 0804 Last data filed at 02/15/2023 0536 Gross per 24 hour  Intake 1247.59 ml  Output 1840 ml  Net -592.41 ml    Filed Weights   02/10/23 1314 02/11/23 0008 02/14/23 0519  Weight: 43.1 kg 38.2 kg 42.3 kg    Examination:  General: Frail elderly female in NAD. Weak.  HEENT: [place]/AT, PERRL, no JVD. MM moist Neuro: Awake, alert, oriented,  non-focal.  CV: RRR, no MRG PULM: Unlabored at rest. Coarse crackles throughout all lung fields.  GI: Soft, NT, ND Extremities: No acute deformity or edema Skin: no rashes or lesions  Pleural Fluid 4/13 >  BC 4/12 > negative  Resolved Hospital Problem list     Assessment & Plan:   Acute Hypoxemic Respiratory Failure secondary to PNA -wean O2 for sats > 90% -continue heated high flow for now (90% and 30lpm) Can likely wean some today.  -PRN BiPAP for increased work of breathing  Multifocal Pneumonia / CAP  -continue zosyn  -pulmonary hygiene  -IS, mobilize  -follow CXR intermittently  Parapneumonic Right Pleural Effusion  Thora on 4/13 with LD 97, TNC 1,894, neutrophil predominant c/w parapneumonic process, 500 ml drained  -abx as above   Sepsis (POA) in setting of PNA -abx as above  -Continue empiric antibiotics  Agitated Delirium / Acute Metabolic Encephalopathy -off precedex, appears to be at baseline mental status  Medullary Thyroid Carcinoma -follow up with ONC at discharge, Dr. Candise Che   Hx Breast, Lung CA -supportive care    Best Practice (right click and "Reselect all SmartList Selections" daily)  Diet/type: NPO DVT prophylaxis: LMWH GI prophylaxis: N/A Lines: N/A Foley:  N/A Code Status:  full code Last date of multidisciplinary goals of care discussion [discussed with son]      Joneen Roach, AGACNP-BC [facility]  See Amion for personal pager PCCM on  call pager [phone] until 7pm. Please call Elink 7p-7a. [phone]  02/15/2023 8:32 AM

## 2023-02-15 NOTE — Evaluation (Signed)
Physical Therapy Evaluation Patient Details Name: Shannon [name] MRN: [id number] DOB: [birth year] Today's Date: 02/15/2023  History of Present Illness  Patient is a 82 year old female who presented with increased difficulty with breathing. Patient was admitted with respiratory failure, respiratory distress,R pleural effusion, severe sepsis, and multifocal pneumonia.Transthoracic Echocardiogram obtained on 4/14 and is significant for an LVEF of 45-50% with associated grade 1 diastolic dysfunction and no regional wall motion abnormalityPatient was transitioned to 30 L HFNC. PMH: left sided breast cancer, PEG placement, lung cancer,  Clinical Impression  Pt admitted with above diagnosis. Pt from home, ind at baseline but was using RW for a couple days leading up to hospitalization. Pt very soft spoken, family at bedside assisting to answer PLOF and home setup, they confirm 24/7 assist at home upon discharge. Pt needing significant assist coming to sitting EOB, strong posterior lean upon sitting EOB needing increased time and assist to reorient to upright sitting, then able to maintain static sitting EOB with close supv. Pt performs step pivot to recliner, min A+2 with HHA at either side, therapists managing all lines and cuing pt on sequencing.  Pt HR and SpO2 monitored throughout session with SpO2 >92% on 30L HFNC, pt denies pain, denies dizziness, without complaints. Pt tolerates remaining up in recliner with family in room and RN aware. Pt currently with functional limitations due to the deficits listed below (see PT Problem List). Pt will benefit from acute skilled PT to increase their independence and safety with mobility to allow discharge.          Recommendations for follow up therapy are one component of a multi-disciplinary discharge planning process, led by the attending physician.  Recommendations may be updated based on patient status, additional functional criteria and insurance  authorization.  Follow Up Recommendations       Assistance Recommended at Discharge Frequent or constant Supervision/Assistance  Patient can return home with the following  A lot of help with walking and/or transfers;A lot of help with bathing/dressing/bathroom;Assistance with cooking/housework;Assist for transportation;Help with stairs or ramp for entrance    Equipment Recommendations Other (comment) (TBD)  Recommendations for Other Services       Functional Status Assessment Patient has had a recent decline in their functional status and demonstrates the ability to make significant improvements in function in a reasonable and predictable amount of time.     Precautions / Restrictions Precautions Precautions: Fall Precaution Comments: monitor O2, 30L HFNC Restrictions Weight Bearing Restrictions: No      Mobility  Bed Mobility Overal bed mobility: Needs Assistance Bed Mobility: Supine to Sit  Supine to sit: Mod assist, +2 for physical assistance  General bed mobility comments: mod A +2 for BLE and trunk management, therapists managing all lines, cues for hand placement, strong posterior lean once sitting EOB needing max A, progressing to supv while seated EOB with increased time    Transfers Overall transfer level: Needs assistance Equipment used: 2 person hand held assist Transfers: Sit to/from Stand, Bed to chair/wheelchair/BSC Sit to Stand: Min assist, +2 physical assistance, +2 safety/equipment  Step pivot transfers: Min assist, +2 physical assistance, +2 safety/equipment  General transfer comment: min A+2 for powering to stand, shifting weight anterior, cues for flat foot posture, able to take 1 step to recliner at bedside with bil HHA, therapists managing lines for safety    Ambulation/Gait   Stairs            Wheelchair Mobility    Modified Rankin (Stroke  Patients Only)       Balance Overall balance assessment: Needs assistance Sitting-balance  support: Feet supported Sitting balance-Leahy Scale: Fair  Standing balance support: During functional activity, Bilateral upper extremity supported Standing balance-Leahy Scale: Poor Standing balance comment: bil HHA for pivot to recliner       Pertinent Vitals/Pain Pain Assessment Pain Assessment: No/denies pain    Home Living Family/patient expects to be discharged to:: Private residence Living Arrangements: Alone Available Help at Discharge: Family;Available 24 hours/day Type of Home: House Home Access: Stairs to enter Entrance Stairs-Rails: Left Entrance Stairs-Number of Steps: 5   Home Layout: One level Home Equipment: Grab bars - tub/shower;Shower Counsellor (2 wheels)      Prior Function Prior Level of Function : Independent/Modified Independent  Mobility Comments: family reports ind, but was using RW for a couple days prior to hospitalization due to feeling weaker ADLs Comments: family reports ind     Hand Dominance   Dominant Hand: Right    Extremity/Trunk Assessment   Upper Extremity Assessment Upper Extremity Assessment: Defer to OT evaluation RUE Deficits / Details: this is patients dominant arm ROM WFL. unable to hold against resistance patient is very slender with muscle waisting. LUE Deficits / Details: patient noted to have difficult time raising arm with IV and other leads in place on this side    Lower Extremity Assessment Lower Extremity Assessment: RLE deficits/detail;LLE deficits/detail RLE Deficits / Details: ankle AROM WFL, able to achieve knee extension via LAQ, hip flexion ~1 inch lift in seated 90/90 position at EOB RLE Sensation: WNL RLE Coordination: WNL LLE Deficits / Details: ankle AROM WFL, able to achieve knee extension via LAQ, hip flexion ~1 inch lift in seated 90/90 position at EOB LLE Sensation: WNL LLE Coordination: WNL    Cervical / Trunk Assessment Cervical / Trunk Assessment: Other exceptions Cervical / Trunk  Exceptions: cachectic appearing  Communication   Communication: Other (comment) (soft spoken)  Cognition Arousal/Alertness: Awake/alert Behavior During Therapy: WFL for tasks assessed/performed Overall Cognitive Status: Within Functional Limits for tasks assessed  General Comments: patient was very soft spoken with global weakness noted. patient eager to participate. family present in room assisted with collection of PLOF.        General Comments General comments (skin integrity, edema, etc.): SpO2 >92% on 30L HFNC, HR 128 max noted momentarily    Exercises General Exercises - Lower Extremity Ankle Circles/Pumps: Seated, AROM, Both, 5 reps Long Arc Quad: Seated, AROM, Both, 5 reps   Assessment/Plan    PT Assessment Patient needs continued PT services  PT Problem List Decreased strength;Decreased activity tolerance;Decreased balance;Decreased mobility;Decreased cognition;Decreased knowledge of use of DME;Cardiopulmonary status limiting activity       PT Treatment Interventions DME instruction;Gait training;Stair training;Functional mobility training;Therapeutic activities;Therapeutic exercise;Balance training;Neuromuscular re-education;Patient/family education    PT Goals (Current goals can be found in the Care Plan section)  Acute Rehab PT Goals Patient Stated Goal: regain strength PT Goal Formulation: With patient/family Time For Goal Achievement: 03/01/23 Potential to Achieve Goals: Fair    Frequency Min 1X/week     Co-evaluation   Reason for Co-Treatment: To address functional/ADL transfers PT goals addressed during session: Mobility/safety with mobility OT goals addressed during session: ADL's and self-care       AM-PAC PT "6 Clicks" Mobility  Outcome Measure Help needed turning from your back to your side while in a flat bed without using bedrails?: Total Help needed moving from lying on your back to sitting on the side  of a flat bed without using bedrails?:  Total Help needed moving to and from a bed to a chair (including a wheelchair)?: Total Help needed standing up from a chair using your arms (e.g., wheelchair or bedside chair)?: Total Help needed to walk in hospital room?: Total Help needed climbing 3-5 steps with a railing? : Total 6 Click Score: 6    End of Session Equipment Utilized During Treatment: Gait belt Activity Tolerance: Patient tolerated treatment well Patient left: in chair;with call bell/phone within reach;with chair alarm set;with family/visitor present Nurse Communication: Mobility status (SpO2, HR) PT Visit Diagnosis: Other abnormalities of gait and mobility (R26.89);Muscle weakness (generalized) (M62.81)    Time: 1610-9604 PT Time Calculation (min) (ACUTE ONLY): 36 min   Charges:   PT Evaluation $PT Eval Moderate Complexity: 1 Mod           Tori Tinslee Klare PT, DPT 02/15/23, 1:32 PM

## 2023-02-15 NOTE — Progress Notes (Signed)
PROGRESS NOTE    Shannon [name]  ZMO:[id number] DOB: [birth year] DOA: 02/10/2023 PCP: Richmond Campbell., PA-C   Brief Narrative: Shannon [name] is a 82 y.o. 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 Patient presented secondary to difficulty breathing and was found to have evidence of multifocal pneumonia in addition to respiratory distress and respiratory failure. Empiric antibiotics started, blood cultures obtained and supplemental oxygen/BiPAP provided. Likely etiology is aspiration. PEG tube converted to G-J tube.   Assessment and Plan:  Severe sepsis secondary to multifocal pneumonia versus aspiration pneumonia -Present on admission with noted tachypnea and tachycardia respiratory distress/failure with presumed pneumonia -Complicated by right-sided pleural effusion status post thoracentesis -Continue Zosyn x 5 days, patient was not improving on initial antibiotics azithromycin/ceftriaxone -Cultures pending, blood culture and pleural fluid preliminary negative  Acute respiratory failure with hypoxia Respiratory distress -Secondary to above multifocal/aspiration pneumonia and right-sided pleural effusion presumed to be parapneumonic -Initially BiPAP dependent, titrating down currently on heated high flow  Right pleural effusion -Thoracentesis with 500 mL hazy fluid aspirated medial lights criteria consistent with parapneumonic effusion -Cultures remain preliminary negative  Acute combined systolic and diastolic heart failure -Transthoracic Echocardiogram obtained on 4/14 and is significant for an LVEF of 45-50% with associated grade 1 diastolic dysfunction and no regional wall motion abnormality. -Improving with diuresis  Urinary retention Foley removed, continue to follow urine output  Hypokalemia Continue to follow replete as  appropriate  Hypothyroidism -Continue Synthroid  Chronic diarrhea Possible related to tube feeds. Stable. Resume home Imodium  Chronic oropharyngeal dysphagia Underweight Dysphagia related to prior neck surgery. PEG tube intact at intake, evidence of significant reflux noted on CT imaging. IR consulted and converted G tube to G/J tube on 4/15. -Resume tube feedings -SLP consult per patient request  Medullary thyroid cancer Patient follows with Dr. Candise Che as an outpatient. Not currently on therapy.  Breast cancer Lung cancer Noted history.  DVT prophylaxis: Lovenox Code Status:   Code Status: Full Code Family Communication: Sister in law at bedside Disposition Plan: Discharge pending ability to wean oxygen if able, continued treatment of pneumonia. Anticipate discharge in 5+ days   Consultants:  Palliative care medicine PCCM  Procedures:  4/13: Right thoracentesis 4/14: Conversion of gastrostomy to single-lumen gastrojejunostomy catheter  Antimicrobials: Ceftriaxone Azithromycin Zosyn IV   Subjective: Patient with anxiety overnight. No dyspnea.  Objective: BP 139/74   Pulse (!) 113   Temp 98.5 F (36.9 C) (Oral)   Resp (!) 23   Ht 5\' 4"  (1.626 m)   Wt 42.3 kg   SpO2 94%   BMI 16.01 kg/m   Examination:  General exam: Appears calm and comfortable. Thin/frail appearing Respiratory system: Rales on auscultation. Respiratory effort normal Cardiovascular system: S1 & S2 heard, tachycardia, normal rhythm Gastrointestinal system: Abdomen is nondistended, soft and nontender. Normal bowel sounds heard. Central nervous system: Alert and oriented. Musculoskeletal: No edema. No calf tenderness Psychiatry: Judgement and insight appear normal. Mood & affect appropriate.    Data Reviewed: I have personally reviewed following labs and imaging studies  CBC Lab Results  Component Value Date   WBC 13.5 (H) 02/15/2023   RBC 5.31 (H) 02/15/2023   HGB 13.6 02/15/2023    HCT 45.4 02/15/2023   MCV 85.5 02/15/2023   MCH 25.6 (L) 02/15/2023   PLT 309 02/15/2023   MCHC 30.0 02/15/2023   RDW 16.8 (H) 02/15/2023   LYMPHSABS  0.4 (L) 02/14/2023   MONOABS 0.6 02/14/2023   EOSABS 0.1 02/14/2023   BASOSABS 0.1 02/14/2023     Last metabolic panel Lab Results  Component Value Date   NA 140 02/15/2023   K 3.7 02/15/2023   CL 107 02/15/2023   CO2 25 02/15/2023   BUN 14 02/15/2023   CREATININE 0.70 02/15/2023   GLUCOSE 141 (H) 02/15/2023   GFRNONAA >60 02/15/2023   GFRAA >60 06/22/2020   CALCIUM 8.4 (L) 02/15/2023   PHOS 2.9 02/12/2023   PROT 6.3 (L) 02/12/2023   ALBUMIN 2.5 (L) 02/12/2023   BILITOT 0.6 02/12/2023   ALKPHOS 111 02/12/2023   AST 25 02/12/2023   ALT 27 02/12/2023   ANIONGAP 8 02/15/2023    GFR: Estimated Creatinine Clearance: 36.2 mL/min (by C-G formula based on SCr of 0.7 mg/dL).  Recent Results (from the past 240 hour(s))  SARS Coronavirus 2 by RT PCR (hospital order, performed in [facility] hospital lab) *cepheid single result test* Anterior Nasal Swab     Status: None   Collection Time: 02/10/23  1:17 PM   Specimen: Anterior Nasal Swab  Result Value Ref Range Status   SARS Coronavirus 2 by RT PCR NEGATIVE NEGATIVE Final    Comment: (NOTE) SARS-CoV-2 target nucleic acids are NOT DETECTED.  The SARS-CoV-2 RNA is generally detectable in upper and lower respiratory specimens during the acute phase of infection. The lowest concentration of SARS-CoV-2 viral copies this assay can detect is 250 copies / mL. A negative result does not preclude SARS-CoV-2 infection and should not be used as the sole basis for treatment or other patient management decisions.  A negative result may occur with improper specimen collection / handling, submission of specimen other than nasopharyngeal swab, presence of viral mutation(s) within the areas targeted by this assay, and inadequate number of viral copies (<250 copies / mL). A negative result  must be combined with clinical observations, patient history, and epidemiological information.  Fact Sheet for Patients:   RoadLapTop.co.za  Fact Sheet for Healthcare Providers: http://kim-miller.com/  This test is not yet approved or  cleared by the [place] FDA and has been authorized for detection and/or diagnosis of SARS-CoV-2 by FDA under an Emergency Use Authorization (EUA).  This EUA will remain in effect (meaning this test can be used) for the duration of the COVID-19 declaration under Section 564(b)(1) of the Act, 21 U.S.C. section 360bbb-3(b)(1), unless the authorization is terminated or revoked sooner.  Performed at [facility], [address], [place], [place] [address]   Blood Culture (routine x 2)     Status: None   Collection Time: 02/10/23  1:17 PM   Specimen: BLOOD  Result Value Ref Range Status   Specimen Description   Final    BLOOD RIGHT ANTECUBITAL Performed at [facility], [address], [place], [place] [address]    Special Requests   Final    BOTTLES DRAWN AEROBIC AND ANAEROBIC Blood Culture adequate volume Performed at [facility], [address], [place], [place] [address]    Culture   Final    NO GROWTH 5 DAYS Performed at [facility], [address] [place], [place] [address]    Report Status 02/15/2023 FINAL  Final  Blood Culture (routine x 2)     Status: None   Collection Time: 02/10/23  1:22 PM   Specimen: BLOOD  Result Value Ref Range Status   Specimen Description   Final  BLOOD LEFT ANTECUBITAL Performed at [facility], [address], [place], [place] [address]    Special Requests   Final    BOTTLES DRAWN AEROBIC AND ANAEROBIC Blood Culture adequate volume Performed at [facility], [address], [place], [place] [address]    Culture   Final    NO GROWTH 5 DAYS Performed at  [facility], [address], [place], [place] [address]    Report Status 02/15/2023 FINAL  Final  Body fluid culture w Gram Stain     Status: None (Preliminary result)   Collection Time: 02/11/23 12:50 PM   Specimen: PATH Cytology Pleural fluid  Result Value Ref Range Status   Specimen Description PLEURAL FLUID RIGHT  Final   Special Requests SYRINGE  Final   Gram Stain NO ORGANISMS SEEN  Final   Culture   Final    NO GROWTH 3 DAYS Performed at [facility], [address] [place], [place] [address]    Report Status PENDING  Incomplete  MRSA Next Gen by PCR, Nasal     Status: None   Collection Time: 02/11/23  6:13 PM   Specimen: Nasal Mucosa; Nasal Swab  Result Value Ref Range Status   MRSA by PCR Next Gen NOT DETECTED NOT DETECTED Final    Comment: (NOTE) The GeneXpert MRSA Assay (FDA approved for NASAL specimens only), is one component of a comprehensive MRSA colonization surveillance program. It is not intended to diagnose MRSA infection nor to guide or monitor treatment for MRSA infections. Test performance is not FDA approved in patients less than 28 years old. Performed at [facility], [address] [place], [place] [address]   Respiratory (~20 pathogens) panel by PCR     Status: None   Collection Time: 02/11/23  6:14 PM   Specimen: Nasopharyngeal Swab; Respiratory  Result Value Ref Range Status   Adenovirus NOT DETECTED NOT DETECTED Final   Coronavirus 229E NOT DETECTED NOT DETECTED Final    Comment: (NOTE) The Coronavirus on the Respiratory Panel, DOES NOT test for the novel  Coronavirus (2019 nCoV)    Coronavirus HKU1 NOT DETECTED NOT DETECTED Final   Coronavirus NL63 NOT DETECTED NOT DETECTED Final   Coronavirus OC43 NOT DETECTED NOT DETECTED Final   Metapneumovirus NOT DETECTED NOT DETECTED Final   Rhinovirus / Enterovirus NOT DETECTED NOT DETECTED Final   Influenza A NOT DETECTED NOT DETECTED Final   Influenza B NOT DETECTED  NOT DETECTED Final   Parainfluenza Virus 1 NOT DETECTED NOT DETECTED Final   Parainfluenza Virus 2 NOT DETECTED NOT DETECTED Final   Parainfluenza Virus 3 NOT DETECTED NOT DETECTED Final   Parainfluenza Virus 4 NOT DETECTED NOT DETECTED Final   Respiratory Syncytial Virus NOT DETECTED NOT DETECTED Final   Bordetella pertussis NOT DETECTED NOT DETECTED Final   Bordetella Parapertussis NOT DETECTED NOT DETECTED Final   Chlamydophila pneumoniae NOT DETECTED NOT DETECTED Final   Mycoplasma pneumoniae NOT DETECTED NOT DETECTED Final    Comment: Performed at [facility], [address] [place], [place] [address]      Radiology Studies: DG Chest Port 1 View  Result Date: 02/14/2023 CLINICAL DATA:  96295 Respiratory failure [id number] [phone] Chest pain [id number] EXAM: PORTABLE CHEST 1 VIEW COMPARISON:  Radiograph 02/11/2023 FINDINGS: Unchanged cardiomediastinal silhouette. There is increased, mid to lower lung predominant airspace disease bilaterally, right greater than left. Small pleural effusions, right greater than left. No evidence of pneumothorax.  Unchanged surgical clips overlying the lower neck. Bones are unchanged. IMPRESSION: Increased bilateral airspace disease, right greater left, with small pleural effusions. Electronically Signed   By: Caprice Renshaw M.D.   On: 02/14/2023 08:08   IR Replc Duoden/Jejuno Tube Percut W/Fluoro  Result Date: 02/13/2023 INDICATION: Long-term indwelling gastrostomy catheter, aspiration pneumonia, reflux, conversion into a post pyloric catheter requested EXAM: CONVERT GASTROSTOMY TO GASTROJEJUNOSTOMY CATHETER UNDER FLUOROSCOPY MEDICATIONS: no periprocedural antibiotics were indicated ANESTHESIA/SEDATION: Viscous lidocaine topical CONTRAST:  10 mL Omnipaque 300-administered into the gastric lumen. FLUOROSCOPY: Radiation Exposure Index (as provided by the fluoroscopic device): 10 mGy Kerma COMPLICATIONS: None immediate. PROCEDURE: Informed written consent was obtained  from the patient after a thorough discussion of the procedural risks, benefits and alternatives. All questions were addressed. Maximal Sterile Barrier Technique was utilized including caps, mask, sterile gowns, sterile gloves, sterile drape, hand hygiene and skin antiseptic. A timeout was performed prior to the initiation of the procedure. Small contrast injection through the previously placed gastrostomy catheter opacified the decompressed lumen of the stomach. Stiff angled Glidewire was advanced to the pylorus. Gastrostomy tube was exchanged for a 5 French angled angiographic catheter, advanced to ligament of Treitz. Catheter was exchanged over the guidewire for a 18 French single-lumen gastrojejunostomy catheter, positioned with its tip at the ligament of Treitz. Contrast injection confirms good positioning and patency. The retention balloon was inflated with 10 mL sterile saline within the gastric lumen. The patient tolerated the procedure well. IMPRESSION: Technically successful conversion of gastrostomy to single-lumen gastrojejunostomy catheter, okay for routine post pyloric use. Electronically Signed   By: Corlis Leak M.D.   On: 02/13/2023 14:19      LOS: 5 days    Carma Leaven DO [facility] 02/15/2023, 7:22 AM   If 7PM-7AM, please contact night-coverage [email or web address]

## 2023-02-15 NOTE — Progress Notes (Signed)
Pt agitated and confused. Bilateral wrist restraints ordered but not needed after talking with patient. Bethany daughter in law at bedside assisted with reorienting patient and decreasing agitation.

## 2023-02-15 NOTE — Progress Notes (Signed)
Pharmacy Antibiotic Note  Shannon [name] is a 82 y.o. female admitted on 02/10/2023 with pneumonia.  Pharmacy has been consulted for Zosyn dosing.  Day #6 antibiotics, day #4 Zosyn SCr remains low/stable Afebrile, WBC 13.5  Plan: Continue Zosyn 3.375g IV q8h (4 hour infusion). Pharmacy will sign off notes, but will continue to follow peripherally for renal dosing, clinical condition, stop date.    Height:  (162.6 cm) Weight: 42.3 kg (93 lb 4.1 oz) IBW/kg (Calculated) : 54.7  Temp (24hrs), Avg:98.4 F (36.9 C), Min:97.7 F (36.5 C), Max:98.7 F (37.1 C)  Recent Labs  Lab 02/10/23 1317 02/10/23 1608 02/10/23 1804 02/11/23 0027 02/12/23 0305 02/13/23 0307 02/14/23 0709 02/15/23 0316  WBC 9.2  --    < > 9.9 12.1* 10.7* 13.3* 13.5*  CREATININE 0.67  --    < > 0.54 0.54 0.59 0.60 0.70  LATICACIDVEN 1.3 1.0  --   --   --   --   --   --    < > = values in this interval not displayed.     Estimated Creatinine Clearance: 36.2 mL/min (by C-G formula based on SCr of 0.7 mg/dL).    Allergies  Allergen Reactions   Codeine Other (See Comments)    hyper Other reaction(s): insomnia   Antibiotics: 4/12 azith >>4/14 4/12 CTX >> 4/14 4/14 ZEI>>   Micro:  4/12 BCx2: NGF 4/13 RVP neg 4/13 MRSA neg 4/13 R pleural fluid: NGF   Thank you for allowing pharmacy to be a part of this patient's care.  Lynann Beaver PharmD, BCPS [facility] main pharmacy [phone] 02/15/2023 9:14 AM

## 2023-02-15 NOTE — Evaluation (Signed)
Occupational Therapy Evaluation Patient Details Name: Shannon [name] MRN: [id number] DOB: 07/18/1941 Today's Date: 02/15/2023   History of Present Illness Patient is a 82 year old female who presented with increased difficulty with breathing. Patient was admitted with respiratory failure, respiratory distress,R pleural effusion, severe sepsis, and multifocal pneumonia.Transthoracic Echocardiogram obtained on 4/14 and is significant for an LVEF of 45-50% with associated grade 1 diastolic dysfunction and no regional wall motion abnormalityPatient was transitioned to 30 L HFNC. PMH: left sided breast cancer, PEG placement, lung cancer,   Clinical Impression   Patient is a  82 year old female who was admitted for above.  Patient was living at home alone prior level. Currently, patient was min A +2 with HHA for transfer from edge of bed to recliner in room with notable fatigue with minimal tasks. Patient was noted to have decreased functional activity tolerance, decreased endurance, decreased standing balance, decreased safety awareness, and decreased knowledge of AD/AE impacting participation in ADLs. plans to transition home with son support at time of d/c. Patient would continue to benefit from skilled OT services at this time while admitted and after d/c to address noted deficits in order to improve overall safety and independence in ADLs.       Recommendations for follow up therapy are one component of a multi-disciplinary discharge planning process, led by the attending physician.  Recommendations may be updated based on patient status, additional functional criteria and insurance authorization.   Assistance Recommended at Discharge Frequent or constant Supervision/Assistance  Patient can return home with the following Two people to help with walking and/or transfers;A lot of help with bathing/dressing/bathroom;Assistance with cooking/housework;Direct supervision/assist for medications  management;Assist for transportation;Help with stairs or ramp for entrance;Direct supervision/assist for financial management    Functional Status Assessment  Patient has had a recent decline in their functional status and demonstrates the ability to make significant improvements in function in a reasonable and predictable amount of time.  Equipment Recommendations  None recommended by OT       Precautions / Restrictions Precautions Precautions: Fall Precaution Comments: monitor O2, 30L HFNC Restrictions Weight Bearing Restrictions: No      Mobility Bed Mobility Overal bed mobility: Needs Assistance Bed Mobility: Supine to Sit     Supine to sit: Mod assist, +2 for physical assistance     General bed mobility comments: mod A +2 for BLE and trunk management, therapists managing all lines, cues for hand placement, strong posterior lean once sitting EOB needing max A, progressing to supv while seated EOB with increased time    Transfers Overall transfer level: Needs assistance Equipment used: 2 person hand held assist Transfers: Sit to/from Stand, Bed to chair/wheelchair/BSC Sit to Stand: Min assist, +2 physical assistance, +2 safety/equipment           General transfer comment: min A+2 for powering to stand, shifting weight anterior, cues for flat foot posture, able to take 1 step to recliner at bedside with bil HHA, therapists managing lines for safety      Balance Overall balance assessment: Needs assistance Sitting-balance support: Feet supported Sitting balance-Leahy Scale: Fair     Standing balance support: During functional activity, Bilateral upper extremity supported Standing balance-Leahy Scale: Poor Standing balance comment: bil HHA for pivot to recliner         ADL either performed or assessed with clinical judgement   ADL Overall ADL's : Needs assistance/impaired Eating/Feeding: NPO Eating/Feeding Details (indicate cue type and reason): PEG tube with  TPN  going at this time. Grooming: Maximal assistance Grooming Details (indicate cue type and reason): patient unable to comb hair with increased time. Upper Body Bathing: Maximal assistance;Bed level   Lower Body Bathing: Bed level;Maximal assistance   Upper Body Dressing : Bed level;Maximal assistance   Lower Body Dressing: Bed level;Maximal assistance   Toilet Transfer: +2 for physical assistance;+2 for safety/equipment;Maximal assistance Toilet Transfer Details (indicate cue type and reason): with HHA with increased time. Toileting- Clothing Manipulation and Hygiene: +2 for safety/equipment;+2 for physical assistance;Bed level                Pertinent Vitals/Pain Pain Assessment Pain Assessment: No/denies pain     Hand Dominance Right   Extremity/Trunk Assessment Upper Extremity Assessment Upper Extremity Assessment: Defer to OT evaluation RUE Deficits / Details: this is patients dominant arm ROM WFL. unable to hold against resistance patient is very slender with muscle waisting. LUE Deficits / Details: patient noted to have difficult time raising arm with IV and other leads in place on this side   Lower Extremity Assessment Lower Extremity Assessment: RLE deficits/detail;LLE deficits/detail RLE Deficits / Details: ankle AROM WFL, able to achieve knee extension via LAQ, hip flexion ~1 inch lift in seated 90/90 position at EOB RLE Sensation: WNL RLE Coordination: WNL LLE Deficits / Details: ankle AROM WFL, able to achieve knee extension via LAQ, hip flexion ~1 inch lift in seated 90/90 position at EOB LLE Sensation: WNL LLE Coordination: WNL   Cervical / Trunk Assessment Cervical / Trunk Assessment: Other exceptions Cervical / Trunk Exceptions: cachectic appearing   Communication Communication Communication: Other (comment) (soft spoken)   Cognition Arousal/Alertness: Awake/alert Behavior During Therapy: WFL for tasks assessed/performed Overall Cognitive Status:  Within Functional Limits for tasks assessed           General Comments: patient was very soft spoken with global weakness noted. patient eager to participate. family present in room assisted with collection of PLOF.     General Comments  SpO2 >92% on 30L HFNC, HR 128 max noted momentarily            Home Living Family/patient expects to be discharged to:: Private residence Living Arrangements: Alone Available Help at Discharge: Family;Available 24 hours/day Type of Home: House Home Access: Stairs to enter [employer] of Steps: 5 Entrance Stairs-Rails: Left Home Layout: One level     Bathroom Shower/Tub: Producer, television/film/video: Standard     Home Equipment: Grab bars - tub/shower;Shower Counsellor (2 wheels)          Prior Functioning/Environment Prior Level of Function : Independent/Modified Independent             Mobility Comments: family reports ind, but was using RW for a couple days prior to hospitalization due to feeling weaker ADLs Comments: family reports ind        OT Problem List: Decreased strength;Decreased activity tolerance;Impaired balance (sitting and/or standing);Decreased coordination;Decreased cognition;Decreased safety awareness;Decreased knowledge of use of DME or AE;Decreased knowledge of precautions      OT Treatment/Interventions: Self-care/ADL training;Therapeutic exercise;Energy conservation;Patient/family education;Therapeutic activities;Balance training    OT Goals(Current goals can be found in the care plan section) Acute Rehab OT Goals Patient Stated Goal: to get better OT Goal Formulation: With patient Time For Goal Achievement: 03/01/23 Potential to Achieve Goals: Fair  OT Frequency: Min 2X/week    Co-evaluation PT/OT/SLP Co-Evaluation/Treatment: Yes Reason for Co-Treatment: To address functional/ADL transfers PT goals addressed during session: Mobility/safety with mobility OT goals  addressed during session: ADL's and self-care      AM-PAC OT "6 Clicks" Daily Activity     Outcome Measure Help from another person eating meals?: Total (NPO) Help from another person taking care of personal grooming?: A Lot Help from another person toileting, which includes using toliet, bedpan, or urinal?: Total Help from another person bathing (including washing, rinsing, drying)?: Total Help from another person to put on and taking off regular upper body clothing?: Total Help from another person to put on and taking off regular lower body clothing?: Total 6 Click Score: 7   End of Session Equipment Utilized During Treatment: Gait belt Nurse Communication: Mobility status  Activity Tolerance: Patient tolerated treatment well Patient left: in chair;with call bell/phone within reach;with chair alarm set  OT Visit Diagnosis: Unsteadiness on feet (R26.81);Muscle weakness (generalized) (M62.81)                Time: 1610-9604 OT Time Calculation (min): 36 min Charges:  OT General Charges $OT Visit: 1 Visit OT Evaluation $OT Eval Moderate Complexity: 1 Mod  Meliyah Simon OTR/L, MS Acute Rehabilitation Department Office# [phone]   Selinda Flavin 02/15/2023, 2:00 PM

## 2023-02-16 DIAGNOSIS — J189 Pneumonia, unspecified organism: Secondary | ICD-10-CM | POA: Diagnosis not present

## 2023-02-16 LAB — CBC
HCT: 42.1 % (ref 36.0–46.0)
Hemoglobin: 12.8 g/dL (ref 12.0–15.0)
MCH: 25.8 pg — ABNORMAL LOW (ref 26.0–34.0)
MCHC: 30.4 g/dL (ref 30.0–36.0)
MCV: 84.9 fL (ref 80.0–100.0)
Platelets: 357 10*3/uL (ref 150–400)
RBC: 4.96 MIL/uL (ref 3.87–5.11)
RDW: 17.2 % — ABNORMAL HIGH (ref 11.5–15.5)
WBC: 12.8 10*3/uL — ABNORMAL HIGH (ref 4.0–10.5)
nRBC: 0 % (ref 0.0–0.2)

## 2023-02-16 LAB — BASIC METABOLIC PANEL
Anion gap: 7 (ref 5–15)
BUN: 18 mg/dL (ref 8–23)
CO2: 25 mmol/L (ref 22–32)
Calcium: 8.1 mg/dL — ABNORMAL LOW (ref 8.9–10.3)
Chloride: 108 mmol/L (ref 98–111)
Creatinine, Ser: 0.64 mg/dL (ref 0.44–1.00)
GFR, Estimated: >60 mL/min (ref >60)
Glucose, Bld: 178 mg/dL — ABNORMAL HIGH (ref 70–99)
Potassium: 3.9 mmol/L (ref 3.5–5.1)
Sodium: 140 mmol/L (ref 135–145)

## 2023-02-16 LAB — GLUCOSE, CAPILLARY
Glucose-Capillary: 120 mg/dL — ABNORMAL HIGH (ref 70–99)
Glucose-Capillary: 167 mg/dL — ABNORMAL HIGH (ref 70–99)
Glucose-Capillary: 119 mg/dL — ABNORMAL HIGH (ref 70–99)
Glucose-Capillary: 119 mg/dL — ABNORMAL HIGH (ref 70–99)
Glucose-Capillary: 149 mg/dL — ABNORMAL HIGH (ref 70–99)
Glucose-Capillary: 102 mg/dL — ABNORMAL HIGH (ref 70–99)

## 2023-02-16 LAB — CYTOLOGY - NON PAP

## 2023-02-16 MED ORDER — ALPRAZOLAM 0.5 MG PO TABS
1.0000 mg | ORAL_TABLET | Freq: Every day | ORAL | Status: DC
  Administered 2023-02-16 – 2023-02-19 (×4): 1 mg via JEJUNOSTOMY
  Filled 2023-02-16 (×5): qty 2

## 2023-02-16 MED ORDER — ALPRAZOLAM 0.5 MG PO TABS
0.5000 mg | ORAL_TABLET | Freq: Every day | ORAL | Status: DC
  Administered 2023-02-17 – 2023-02-20 (×4): 0.5 mg
  Filled 2023-02-16 (×5): qty 1

## 2023-02-16 NOTE — Progress Notes (Signed)
Physical Therapy Treatment Patient Details Name: Shannon [name] MRN: [id number] DOB: 29-Nov-1940 Today's Date: 02/16/2023   History of Present Illness Patient is a 82 year old female who presented with increased difficulty with breathing. Patient was admitted with respiratory failure, respiratory distress,R pleural effusion, severe sepsis, and multifocal pneumonia.Transthoracic Echocardiogram obtained on 4/14 and is significant for an LVEF of 45-50% with associated grade 1 diastolic dysfunction and no regional wall motion abnormalityPatient was transitioned to 30 L HFNC. PMH: left sided breast cancer, PEG placement, lung cancer,    PT Comments    The patient is resting in bed SPO2  98% on 30 L HHFNC. Patient required min assistance for moving to EOB and  step pivot to [facility]  with  hand hold assistance, stood again for recliner to be placed .  HR 112, SPo2 98% during activity.   BP  after settled in recliner 110/55, HR 70.   Recommendations for follow up therapy are one component of a multi-disciplinary discharge planning process, led by the attending physician.  Recommendations may be updated based on patient status, additional functional criteria and insurance authorization.  Follow Up Recommendations       Assistance Recommended at Discharge Frequent or constant Supervision/Assistance  Patient can return home with the following A lot of help with walking and/or transfers;A lot of help with bathing/dressing/bathroom;Assistance with cooking/housework;Assist for transportation;Help with stairs or ramp for entrance   Equipment Recommendations   (TBD)    Recommendations for Other Services       Precautions / Restrictions Precautions Precaution Comments: monitor O2, 30L HHFNC, PEG     Mobility  Bed Mobility Overal bed mobility: Needs Assistance Bed Mobility: Supine to Sit     Supine to sit: Min assist     General bed mobility comments: extra time, asssist with trunk  to sit  upright     Transfers Overall transfer level: Needs assistance Equipment used: 1 person hand held assist Transfers: Sit to/from Stand, Bed to chair/wheelchair/BSC Sit to Stand: Min assist, +2 safety/equipment   Step pivot transfers: Min assist, +2 physical assistance, +2 safety/equipment       General transfer comment: multimodal cues,  assist to stand and pivot to [facility], stood again and held bed rail  for recliner to be brought up.    Ambulation/Gait                   Stairs             Wheelchair Mobility    Modified Rankin (Stroke Patients Only)       Balance Overall balance assessment: Needs assistance Sitting-balance support: Feet supported Sitting balance-Leahy Scale: Fair     Standing balance support: During functional activity, Bilateral upper extremity supported Standing balance-Leahy Scale: Poor Standing balance comment: reliant on  UE support                            Cognition Arousal/Alertness: Awake/alert Behavior During Therapy: WFL for tasks assessed/performed Overall Cognitive Status: Within Functional Limits for tasks assessed                                 General Comments: patient was very soft spoken . patient eager to participate. family present in room        Exercises      General Comments  Pertinent Vitals/Pain Pain Assessment Pain Assessment: 0-10 Faces Pain Scale: Hurts even more Pain Location: back    Home Living                          Prior Function            PT Goals (current goals can now be found in the care plan section) Progress towards PT goals: Progressing toward goals    Frequency    Min 1X/week      PT Plan Current plan remains appropriate    Co-evaluation              AM-PAC PT "6 Clicks" Mobility   Outcome Measure  Help needed turning from your back to your side while in a flat bed without using bedrails?: A Lot Help needed moving from  lying on your back to sitting on the side of a flat bed without using bedrails?: A Lot Help needed moving to and from a bed to a chair (including a wheelchair)?: A Lot Help needed standing up from a chair using your arms (e.g., wheelchair or bedside chair)?: A Lot Help needed to walk in hospital room?: Total Help needed climbing 3-5 steps with a railing? : Total 6 Click Score: 10    End of Session Equipment Utilized During Treatment: Oxygen Activity Tolerance: Patient tolerated treatment well;Patient limited by fatigue Patient left: in chair;with call bell/phone within reach;with chair alarm set;with family/visitor present Nurse Communication: Mobility status PT Visit Diagnosis: Other abnormalities of gait and mobility (R26.89);Muscle weakness (generalized) (M62.81)     Time: 1610-9604 PT Time Calculation (min) (ACUTE ONLY): 32 min  Charges:  $Therapeutic Activity: 23-37 mins                     Blanchard Kelch PT Acute Rehabilitation Services Office [phone] Weekend pager-[phone]    Rada Hay 02/16/2023, 1:12 PM

## 2023-02-16 NOTE — Progress Notes (Signed)
PROGRESS NOTE    Shannon SLAVEN  [name]:[id number] DOB: 1941/04/28 DOA: 02/10/2023 PCP: Richmond Campbell., PA-C   Brief Narrative: Shannon [name] is a 82 y.o. 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 Patient presented secondary to difficulty breathing and was found to have evidence of multifocal pneumonia in addition to respiratory distress and respiratory failure. Empiric antibiotics started, blood cultures obtained and supplemental oxygen/BiPAP provided. Likely etiology is aspiration. PEG tube converted to G-J tube.   Assessment and Plan:  Severe sepsis secondary to multifocal pneumonia versus aspiration pneumonia -Present on admission with noted tachypnea and tachycardia respiratory distress/failure with presumed pneumonia -Complicated by right-sided pleural effusion status post thoracentesis -Continue Zosyn x 5 days, patient was not improving on initial antibiotics azithromycin/ceftriaxone -Cultures pending, remain negative  Acute respiratory failure with hypoxia, improving Respiratory distress -Secondary to above multifocal/aspiration pneumonia and right-sided pleural effusion presumed to be parapneumonic -Initially BiPAP dependent, titrating down currently on heated high flow  Right pleural effusion -Thoracentesis with 500 mL hazy fluid aspirated medial lights criteria consistent with parapneumonic effusion -Cultures remain preliminary negative  Acute combined systolic and diastolic heart failure -Transthoracic Echocardiogram obtained on 4/14 and is significant for an LVEF of 45-50% with associated grade 1 diastolic dysfunction and no regional wall motion abnormality. -Improving with diuresis  Urinary retention Foley removed, continue to follow urine output  Hypokalemia Continue to follow replete as appropriate  Hypothyroidism -Continue Synthroid  Chronic  diarrhea Possible related to tube feeds. Stable. Resume home Imodium  Chronic oropharyngeal dysphagia Underweight Dysphagia related to prior neck surgery. PEG tube intact at intake, evidence of significant reflux noted on CT imaging. IR consulted and converted G tube to G/J tube on 4/15. -Resume tube feedings -SLP consult per patient request - remains NPO for now  Medullary thyroid cancer Patient follows with Dr. Candise Che as an outpatient. Not currently on therapy.  Breast cancer Lung cancer Noted history.  DVT prophylaxis: Lovenox Code Status:   Code Status: DNR Family Communication: Sister in law at bedside Disposition Plan: Discharge pending ability to wean oxygen if able, continued treatment of pneumonia. Anticipate discharge in 5+ days   Consultants:  Palliative care medicine PCCM  Procedures:  4/13: Right thoracentesis 4/14: Conversion of gastrostomy to single-lumen gastrojejunostomy catheter  Antimicrobials: Ceftriaxone Azithromycin Zosyn IV   Subjective: Patient with anxiety overnight. No dyspnea.  Objective: BP (!) 107/54   Pulse (!) 108   Temp 97.7 F (36.5 C) (Oral)   Resp (!) 25   Ht  (1.626 m)   Wt 42.3 kg   SpO2 97%   BMI 16.01 kg/m   Examination:  General exam: Appears calm and comfortable. Thin/frail appearing Respiratory system: Rales on auscultation. Respiratory effort normal Cardiovascular system: S1 & S2 heard, tachycardia, normal rhythm Gastrointestinal system: Abdomen is nondistended, soft and nontender. Normal bowel sounds heard. Central nervous system: Alert and oriented. Musculoskeletal: No edema. No calf tenderness Psychiatry: Judgement and insight appear normal. Mood & affect appropriate.    Data Reviewed: I have personally reviewed following labs and imaging studies  CBC Lab Results  Component Value Date   WBC 13.5 (H) 02/15/2023   RBC 5.31 (H) 02/15/2023   HGB 13.6 02/15/2023   HCT 45.4 02/15/2023   MCV 85.5  02/15/2023   MCH 25.6 (L) 02/15/2023   PLT 309 02/15/2023   MCHC 30.0 02/15/2023   RDW 16.8 (H) 02/15/2023  LYMPHSABS 0.4 (L) 02/14/2023   MONOABS 0.6 02/14/2023   EOSABS 0.1 02/14/2023   BASOSABS 0.1 02/14/2023     Last metabolic panel Lab Results  Component Value Date   NA 140 02/15/2023   K 3.7 02/15/2023   CL 107 02/15/2023   CO2 25 02/15/2023   BUN 14 02/15/2023   CREATININE 0.70 02/15/2023   GLUCOSE 141 (H) 02/15/2023   GFRNONAA >60 02/15/2023   GFRAA >60 06/22/2020   CALCIUM 8.4 (L) 02/15/2023   PHOS 2.9 02/12/2023   PROT 6.3 (L) 02/12/2023   ALBUMIN 2.5 (L) 02/12/2023   BILITOT 0.6 02/12/2023   ALKPHOS 111 02/12/2023   AST 25 02/12/2023   ALT 27 02/12/2023   ANIONGAP 8 02/15/2023    GFR: Estimated Creatinine Clearance: 36.2 mL/min (by C-G formula based on SCr of 0.7 mg/dL).  Recent Results (from the past 240 hour(s))  SARS Coronavirus 2 by RT PCR (hospital order, performed in [facility] hospital lab) *cepheid single result test* Anterior Nasal Swab     Status: None   Collection Time: 02/10/23  1:17 PM   Specimen: Anterior Nasal Swab  Result Value Ref Range Status   SARS Coronavirus 2 by RT PCR NEGATIVE NEGATIVE Final    Comment: (NOTE) SARS-CoV-2 target nucleic acids are NOT DETECTED.  The SARS-CoV-2 RNA is generally detectable in upper and lower respiratory specimens during the acute phase of infection. The lowest concentration of SARS-CoV-2 viral copies this assay can detect is 250 copies / mL. A negative result does not preclude SARS-CoV-2 infection and should not be used as the sole basis for treatment or other patient management decisions.  A negative result may occur with improper specimen collection / handling, submission of specimen other than nasopharyngeal swab, presence of viral mutation(s) within the areas targeted by this assay, and inadequate number of viral copies (<250 copies / mL). A negative result must be combined with  clinical observations, patient history, and epidemiological information.  Fact Sheet for Patients:   RoadLapTop.co.za  Fact Sheet for Healthcare Providers: http://kim-miller.com/  This test is not yet approved or  cleared by the [place] FDA and has been authorized for detection and/or diagnosis of SARS-CoV-2 by FDA under an Emergency Use Authorization (EUA).  This EUA will remain in effect (meaning this test can be used) for the duration of the COVID-19 declaration under Section 564(b)(1) of the Act, 21 U.S.C. section 360bbb-3(b)(1), unless the authorization is terminated or revoked sooner.  Performed at [facility], [address], [place], [place] [address]   Blood Culture (routine x 2)     Status: None   Collection Time: 02/10/23  1:17 PM   Specimen: BLOOD  Result Value Ref Range Status   Specimen Description   Final    BLOOD RIGHT ANTECUBITAL Performed at [facility], [address], [place], [place] [address]    Special Requests   Final    BOTTLES DRAWN AEROBIC AND ANAEROBIC Blood Culture adequate volume Performed at [facility], [address] [place], [place] [address]    Culture   Final    NO GROWTH 5 DAYS Performed at [facility], [address] [place], [place] [address]    Report Status 02/15/2023 FINAL  Final  Blood Culture (routine x 2)     Status: None   Collection Time: 02/10/23  1:22 PM   Specimen: BLOOD  Result Value Ref Range Status   Specimen Description   Final  BLOOD LEFT ANTECUBITAL Performed at [facility], [address], [place], [place] [address]    Special Requests   Final    BOTTLES DRAWN AEROBIC AND ANAEROBIC Blood Culture adequate volume Performed at [facility], [address], [place], [place] [address]    Culture   Final    NO GROWTH 5 DAYS Performed at [facility], [address] [place], [place] [address]    Report Status 02/15/2023 FINAL  Final  Body fluid culture w Gram Stain     Status: None   Collection Time: 02/11/23 12:50 PM   Specimen: PATH Cytology Pleural fluid  Result Value Ref Range Status   Specimen Description PLEURAL FLUID RIGHT  Final   Special Requests SYRINGE  Final   Gram Stain NO ORGANISMS SEEN  Final   Culture   Final    NO GROWTH 3 DAYS Performed at [facility], [address], [place], [place] [address]    Report Status 02/15/2023 FINAL  Final  MRSA Next Gen by PCR, Nasal     Status: None   Collection Time: 02/11/23  6:13 PM   Specimen: Nasal Mucosa; Nasal Swab  Result Value Ref Range Status   MRSA by PCR Next Gen NOT DETECTED NOT DETECTED Final    Comment: (NOTE) The GeneXpert MRSA Assay (FDA approved for NASAL specimens only), is one component of a comprehensive MRSA colonization surveillance program. It is not intended to diagnose MRSA infection nor to guide or monitor treatment for MRSA infections. Test performance is not FDA approved in patients less than 29 years old. Performed at [facility], [address], [place], [place] [address]   Respiratory (~20 pathogens) panel by PCR     Status: None   Collection Time: 02/11/23  6:14 PM   Specimen: Nasopharyngeal Swab; Respiratory  Result Value Ref Range Status   Adenovirus NOT DETECTED NOT DETECTED Final   Coronavirus 229E NOT DETECTED NOT DETECTED Final    Comment: (NOTE) The Coronavirus on the Respiratory Panel, DOES NOT test for the novel  Coronavirus (2019 nCoV)    Coronavirus HKU1 NOT DETECTED NOT DETECTED Final   Coronavirus NL63 NOT DETECTED NOT DETECTED Final   Coronavirus OC43 NOT DETECTED NOT DETECTED Final   Metapneumovirus NOT DETECTED NOT DETECTED Final   Rhinovirus / Enterovirus NOT DETECTED NOT DETECTED Final   Influenza A NOT DETECTED NOT DETECTED Final   Influenza B NOT DETECTED NOT DETECTED Final   Parainfluenza  Virus 1 NOT DETECTED NOT DETECTED Final   Parainfluenza Virus 2 NOT DETECTED NOT DETECTED Final   Parainfluenza Virus 3 NOT DETECTED NOT DETECTED Final   Parainfluenza Virus 4 NOT DETECTED NOT DETECTED Final   Respiratory Syncytial Virus NOT DETECTED NOT DETECTED Final   Bordetella pertussis NOT DETECTED NOT DETECTED Final   Bordetella Parapertussis NOT DETECTED NOT DETECTED Final   Chlamydophila pneumoniae NOT DETECTED NOT DETECTED Final   Mycoplasma pneumoniae NOT DETECTED NOT DETECTED Final    Comment: Performed at [facility], [address] [place], [place] [address]      Radiology Studies: No results found.    LOS: 6 days    Carma Leaven DO [facility] 02/16/2023, 9:11 AM   If 7PM-7AM, please contact night-coverage [email or web address]

## 2023-02-16 NOTE — Progress Notes (Signed)
NAME:  Shannon [name], MRN:  [id number], DOB:  1941-07-21, LOS: 6 ADMISSION DATE:  02/10/2023, CONSULTATION DATE: 4/14 REFERRING MD: Dr. Caleb Popp, CHIEF COMPLAINT: Agitated delirium  History of Present Illness:  82 y/o F with PMH hypothyroidism with medullary thyroid cancer, oropharyngeal dysphagia s/p peg, L breast cancer, lung cancer presented to [facility] with multifocal pneumonia, started on abx req high flow [place] and intermittent NIV. Developed progressive agitation / intolerance for NIV 2/2 agitated delirium precedex was started and as such CCM was consulted for management of gtt.     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   Cancer    Colon polyp    Contact lens/glasses fitting    HAS LENS IMPLANTS   Lung cancer 2015   LYMPHNODE REMOVAL    Medullary carcinoma of thyroid 2011   Osteoporosis    Significant Hospital Events: Including procedures, antibiotic start and stop dates in addition to other pertinent events   4/12 Admit with multifocal PNA, precedex initiated for agitated delirium  4/15 HFNC, awake/alert > off precedex 4/17 Did not rest well overnight, tired. 30L/90% HHFNC  Interim History / Subjective:  Afebrile  O2 weaned to 30L/60% Pt denies acute complaints  Objective   Blood pressure (!) 112/45, pulse (!) 103, temperature 97.7 F (36.5 C), temperature source Oral, resp. rate (!) 24, height 5\' 4"  (1.626 m), weight 42.3 kg, SpO2 96 %.    FiO2 (%):  [62 %-90 %] 63 %   Intake/Output Summary (Last 24 hours) at 02/16/2023 0727 Last data filed at 02/16/2023 0424 Gross per 24 hour  Intake 284.68 ml  Output 1571 ml  Net -1286.32 ml   Filed Weights   02/10/23 1314 02/11/23 0008 02/14/23 0519  Weight: 43.1 kg 38.2 kg 42.3 kg    Examination:  General: frail elderly female sitting up in bed in NAD HEENT: MM pink/moist, high flow [place] in place, anicteric Neuro:  AAOx4, speech clear, MAE  CV: s1s2 RRR, no m/r/g PULM: non-labored at rest, lungs bilaterally diminished, basilar crackles  GI: soft, bsx4 active, PEG in place  Extremities: warm/dry, no peripheral edema  Skin: no rashes or lesions  Pleural Fluid 4/13 > negative  BC 4/12 > negative  Resolved Hospital Problem list     Assessment & Plan:   Acute Hypoxemic Respiratory Failure secondary to PNA -HHFNC, wean O2 for sats >90% -pulmonary hygiene -IS, mobilize  Multifocal Pneumonia / CAP  -zosyn, D5/7  -follow intermittent CXR -pleural fluid negative / NOS -aspiration precautions  Parapneumonic Right Pleural Effusion  Thora on 4/13 with LD 97, TNC 1,894, neutrophil predominant c/w parapneumonic process, 500 ml drained  -pleural fluid negative culture -follow up cytology  -treat underlying PNA  Sepsis (POA) in setting of PNA -abx as above   Agitated Delirium / Acute Metabolic Encephalopathy -resolved   Medullary Thyroid Carcinoma -ONC follow up as outpatient with Dr. Candise Che   Hx Breast, Lung CA -supportive care   Best Practice (right click and "Reselect all SmartList Selections" daily)  Diet/type: NPO, sips DVT prophylaxis: LMWH GI prophylaxis: N/A Lines: N/A Foley:  N/A Code Status:  DNR Last date of multidisciplinary goals of care discussion [discussed with son]  PCCM will be available PRN. Please call back if new needs arise.     Canary Brim, MSN, APRN, NP-C, AGACNP-BC [facility] 02/16/2023, 7:28 AM  Please see [email or web address] for pager details.   From 7A-7P if no response, please call [phone] After hours, please call ELink [phone]

## 2023-02-16 NOTE — Progress Notes (Signed)
Nutrition Follow-up  DOCUMENTATION CODES:   Severe malnutrition in context of chronic illness  INTERVENTION:  - Patient will need continuous tube feeds now that G tube exchanged for a J tube.   - Advance to 24 hour continuous TF regimen as below: [employer] 1.4 at 70 ml/h (1680 ml per day) Provides 2352 kcal, 103 gm protein, 1210 ml free water daily *note: regimen exceeds kcal/protein needs but is similar to what patient has been receiving and tolerating outpatient.   - Monitor magnesium, potassium, and phosphorus, MD to replete as needed.   - FWF per CCM/MD.                - Would recommend Q4H as appropriate.   - Monitor weight trends.  - Patient and family would like to have patient transition to cyclic feeds to allow time off pump when appropriate. Recommend: Molli Posey 1.4 @ 193mL/hr x16 hours (consider 6pm-10am) Provides 2464 kcal, 108g protein, and free water from formula, consider free water flushes 6x/day for total of 1945mL/day - Can transition slowly as below for better tolerance if needed:  - 29mL/hr x22 hours  - 67mL x19 hours  - x16 hours   NUTRITION DIAGNOSIS:   Severe Malnutrition related to chronic illness (cancer) as evidenced by severe fat depletion, severe muscle depletion. *ongoing  GOAL:   Patient will meet greater than or equal to 90% of their needs *progressing, on TF  MONITOR:   Diet advancement, Labs, Weight trends, TF tolerance  REASON FOR ASSESSMENT:   Consult Assessment of nutrition requirement/status  ASSESSMENT:   82 year old female with PMHx of left-sided breast cancer, lung cancer, chronic diarrhea, metastatic medullary thyroid carcinoma, G-tube dependence admitted with multifocal PNA.  Patient resting in bed at time of visit, son and daughter-in-law at bedside.   Tube feeds running at 73mL/hr this AM, patient tolerating well. Anticipate patient will be able to advance to 24 hour goal of 22mL/hr  today.  Patient remains NPO other than ice chips. Had SLP eval yesterday and MBS recommended but plan to wait until breathing improves.  Discussed plan to eventually transition to cyclic tube feeds once patient tolerating goal 24 hour regimen.  Plan for 144mL/hr x16 hours.  Son and daughter-in-law as well as patient agreeable with plan. Recs as above.    Medications reviewed and include: Insulin, Imodium  Labs reviewed:  -   Diet Order:   Diet Order             Diet NPO time specified Except for: Other (See Comments)  Diet effective now                   EDUCATION NEEDS:  Education needs have been addressed  Skin:  Skin Assessment: Reviewed RN Assessment  Last BM:  4/18 - rectal tube  Height:  Ht Readings from Last 1 Encounters:  02/11/23 5\' 4"  (1.626 m)   Weight:  Wt Readings from Last 1 Encounters:  02/14/23 42.3 kg   Ideal Body Weight:  54.5 kg  BMI:  Body mass index is 16.01 kg/m.  Estimated Nutritional Needs:  Kcal:  1650-1850 Protein:  70-85 grams Fluid:  >/= 1.6 L/day    Shelle Iron RD, LDN For contact information, refer to [place] [facility].

## 2023-02-16 NOTE — Progress Notes (Signed)
Chaplain engaged in a follow-up visit with Shannon [name]'s son and healthcare POA. He desired to get more clarity on the "Living Will" portion of the document. He wanted to ensure Shannon [name] would continue to receive tube feeding (nutrition and hydration). Chaplain spent time explaining how the Advanced Directive document works in that because Shannon [name] can speak for herself and has been coherent, physicians should continue to talk directly to her about medical care needs and desires. Chaplain stressed that she is still able to give consent or not consent to interventions. If she ever becomes unable to speak for herself physically or due to capacity, Chaplain expressed to her son that he then has the ability to make decisions on her behalf. In the document, Shannon [name] checked that the medical team should always refer back to her healthcare agent and not simply go by the "Living Will" document.   Chaplain offered support around [place] agency, as well as his, to continue to make the right and best decisions for her. Chaplain assessed some anxiety around interventions being stopped or not given to her. Chaplain worked to define purpose of the document and provide clarity.     02/16/23 1000  Spiritual Encounters  Type of Visit Follow up  Care provided to: Family  Reason for visit Routine spiritual support

## 2023-02-17 ENCOUNTER — Inpatient Hospital Stay (HOSPITAL_COMMUNITY): Payer: Medicare Other

## 2023-02-17 DIAGNOSIS — J189 Pneumonia, unspecified organism: Secondary | ICD-10-CM | POA: Diagnosis not present

## 2023-02-17 LAB — GLUCOSE, CAPILLARY
Glucose-Capillary: 161 mg/dL — ABNORMAL HIGH (ref 70–99)
Glucose-Capillary: 144 mg/dL — ABNORMAL HIGH (ref 70–99)
Glucose-Capillary: 133 mg/dL — ABNORMAL HIGH (ref 70–99)
Glucose-Capillary: 125 mg/dL — ABNORMAL HIGH (ref 70–99)
Glucose-Capillary: 127 mg/dL — ABNORMAL HIGH (ref 70–99)

## 2023-02-17 LAB — CREATININE, SERUM
Creatinine, Ser: 0.71 mg/dL (ref 0.44–1.00)
GFR, Estimated: >60 mL/min (ref >60)

## 2023-02-17 MED ORDER — KATE FARMS STANDARD 1.4 PO LIQD
1680.0000 mL | ORAL | Status: DC
  Administered 2023-02-18 – 2023-02-19 (×2): 1680 mL
  Filled 2023-02-17 (×3): qty 1950

## 2023-02-17 MED ORDER — BANATROL TF EN LIQD: 60.0000 mL | Freq: Four times a day (QID) | ENTERAL | Status: DC

## 2023-02-17 NOTE — Progress Notes (Signed)
PROGRESS NOTE    Shannon [name]  ZOX:[id number] DOB: [birth year] DOA: 02/10/2023 PCP: Richmond Campbell., PA-C   Brief Narrative: Shannon [name] is a 82 y.o. 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 Patient presented secondary to difficulty breathing and was found to have evidence of multifocal pneumonia in addition to respiratory distress and respiratory failure. Empiric antibiotics started, blood cultures obtained and supplemental oxygen/BiPAP provided. Likely etiology is aspiration. PEG tube converted to G-J tube.   Assessment and Plan:  Severe sepsis secondary to multifocal pneumonia versus aspiration pneumonia -Present on admission with noted tachypnea and tachycardia respiratory distress/failure with presumed pneumonia -Complicated by right-sided pleural effusion status post thoracentesis -Continue Zosyn x 5 days, patient was not improving on initial antibiotics azithromycin/ceftriaxone -Cultures pending, remain negative  Acute respiratory failure with hypoxia, improving Respiratory distress -Secondary to above multifocal/aspiration pneumonia and right-sided pleural effusion presumed to be parapneumonic -Initially BiPAP dependent, continue to wean oxygen aggressively given her ongoing improvement -Flutter and incentive spirometry ongoing  Right pleural effusion -Thoracentesis with 500 mL hazy fluid aspirated medial lights criteria consistent with parapneumonic effusion -Cultures remain preliminary negative, cytology negative for malignant cells -No indication for repeat imaging given ongoing improvement in respiratory status and oxygen  Acute combined systolic and diastolic heart failure -Transthoracic Echocardiogram obtained on 4/14 and is significant for an LVEF of 45-50% with associated grade 1 diastolic dysfunction and no regional wall motion  abnormality. -Improving with diuresis  Urinary retention Foley removed, continue to follow urine output -Continues to have postvoid residual but this may be her baseline, usually low-volume around 100 cc and asymptomatic  Hypokalemia Continue to follow replete as appropriate  Hypothyroidism -Continue Synthroid  Chronic diarrhea Possible related to tube feeds. Stable. Resume home Imodium  Chronic oropharyngeal dysphagia Underweight Dysphagia related to prior neck surgery. PEG tube intact at intake, evidence of significant reflux noted on CT imaging. IR consulted and converted G tube to G/J tube on 4/15. -Continue tube feedings -SLP consult per patient request - allowed sips of water/ice for now; remains high risk for aspiration.  Patient understands the risks of ongoing p.o. intake  Medullary thyroid cancer Patient follows with Dr. Candise Che as an outpatient. Not currently on therapy.  Breast cancer Lung cancer Noted history.  DVT prophylaxis: Lovenox Code Status:   Code Status: DNR Family Communication: Sister in law at bedside Disposition Plan: Discharge pending ability to wean oxygen - likely in the next 48-72h  Consultants:  Palliative care medicine PCCM  Procedures:  4/13: Right thoracentesis 4/14: Conversion of gastrostomy to single-lumen gastrojejunostomy catheter  Antimicrobials: Ceftriaxone Azithromycin Zosyn IV   Subjective: Patient with anxiety overnight. No dyspnea.  Objective: BP (!) 117/47   Pulse (!) 108   Temp 98 F (36.7 C) (Oral)   Resp (!) 26   Ht  (1.626 m)   Wt 35.5 kg   SpO2 99%   BMI 13.43 kg/m   Examination:  General exam: Appears calm and comfortable. Thin/frail appearing Respiratory system: Rales on auscultation. Respiratory effort normal Cardiovascular system: S1 & S2 heard, tachycardia, normal rhythm Gastrointestinal system: Abdomen is nondistended, soft and nontender. Normal bowel sounds heard. Central nervous system:  Alert and oriented. Musculoskeletal: No edema. No calf tenderness Psychiatry: Judgement and insight appear normal. Mood & affect appropriate.    Data Reviewed: I have personally reviewed following labs and imaging studies  CBC Lab Results  Component  Value Date   WBC 12.8 (H) 02/16/2023   RBC 4.96 02/16/2023   HGB 12.8 02/16/2023   HCT 42.1 02/16/2023   MCV 84.9 02/16/2023   MCH 25.8 (L) 02/16/2023   PLT 357 02/16/2023   MCHC 30.4 02/16/2023   RDW 17.2 (H) 02/16/2023   LYMPHSABS 0.4 (L) 02/14/2023   MONOABS 0.6 02/14/2023   EOSABS 0.1 02/14/2023   BASOSABS 0.1 02/14/2023     Last metabolic panel Lab Results  Component Value Date   NA 140 02/16/2023   K 3.9 02/16/2023   CL 108 02/16/2023   CO2 25 02/16/2023   BUN 18 02/16/2023   CREATININE 0.64 02/16/2023   GLUCOSE 178 (H) 02/16/2023   GFRNONAA >60 02/16/2023   GFRAA >60 06/22/2020   CALCIUM 8.1 (L) 02/16/2023   PHOS 2.9 02/12/2023   PROT 6.3 (L) 02/12/2023   ALBUMIN 2.5 (L) 02/12/2023   BILITOT 0.6 02/12/2023   ALKPHOS 111 02/12/2023   AST 25 02/12/2023   ALT 27 02/12/2023   ANIONGAP 7 02/16/2023    GFR: Estimated Creatinine Clearance: 30.4 mL/min (by C-G formula based on SCr of 0.64 mg/dL).  Recent Results (from the past 240 hour(s))  SARS Coronavirus 2 by RT PCR (hospital order, performed in [facility] hospital lab) *cepheid single result test* Anterior Nasal Swab     Status: None   Collection Time: 02/10/23  1:17 PM   Specimen: Anterior Nasal Swab  Result Value Ref Range Status   SARS Coronavirus 2 by RT PCR NEGATIVE NEGATIVE Final    Comment: (NOTE) SARS-CoV-2 target nucleic acids are NOT DETECTED.  The SARS-CoV-2 RNA is generally detectable in upper and lower respiratory specimens during the acute phase of infection. The lowest concentration of SARS-CoV-2 viral copies this assay can detect is 250 copies / mL. A negative result does not preclude SARS-CoV-2 infection and should not be used as the  sole basis for treatment or other patient management decisions.  A negative result may occur with improper specimen collection / handling, submission of specimen other than nasopharyngeal swab, presence of viral mutation(s) within the areas targeted by this assay, and inadequate number of viral copies (<250 copies / mL). A negative result must be combined with clinical observations, patient history, and epidemiological information.  Fact Sheet for Patients:   RoadLapTop.co.za  Fact Sheet for Healthcare Providers: http://kim-miller.com/  This test is not yet approved or  cleared by the [place] FDA and has been authorized for detection and/or diagnosis of SARS-CoV-2 by FDA under an Emergency Use Authorization (EUA).  This EUA will remain in effect (meaning this test can be used) for the duration of the COVID-19 declaration under Section 564(b)(1) of the Act, 21 U.S.C. section 360bbb-3(b)(1), unless the authorization is terminated or revoked sooner.  Performed at [facility], [address], [place], [place] [address]   Blood Culture (routine x 2)     Status: None   Collection Time: 02/10/23  1:17 PM   Specimen: BLOOD  Result Value Ref Range Status   Specimen Description   Final    BLOOD RIGHT ANTECUBITAL Performed at [facility], [address], [place], [place] [address]    Special Requests   Final    BOTTLES DRAWN AEROBIC AND ANAEROBIC Blood Culture adequate volume Performed at [facility], [address] [place], [place] [address]    Culture   Final    NO GROWTH 5 DAYS Performed at [facility], [address] [place], [place] [address]    Report Status 02/15/2023 FINAL  Final  Blood Culture (routine x 2)     Status: None   Collection Time: 04/12Vilinda Blanks:22 PM   Specimen: BLOOD  Result Value Ref Range Status   Specimen Description   Final    BLOOD LEFT  ANTECUBITAL Performed at [facility], [address], [place], [place] [address]    Special Requests   Final    BOTTLES DRAWN AEROBIC AND ANAEROBIC Blood Culture adequate volume Performed at [facility], [address], [place], [place] [address]    Culture   Final    NO GROWTH 5 DAYS Performed at [facility], [address] [place], [place] [address]    Report Status 02/15/2023 FINAL  Final  Body fluid culture w Gram Stain     Status: None   Collection Time: 02/11/23 12:50 PM   Specimen: PATH Cytology Pleural fluid  Result Value Ref Range Status   Specimen Description PLEURAL FLUID RIGHT  Final   Special Requests SYRINGE  Final   Gram Stain NO ORGANISMS SEEN  Final   Culture   Final    NO GROWTH 3 DAYS Performed at [facility], [address], [place], [place] [address]    Report Status 02/15/2023 FINAL  Final  MRSA Next Gen by PCR, Nasal     Status: None   Collection Time: 02/11/23  6:13 PM   Specimen: Nasal Mucosa; Nasal Swab  Result Value Ref Range Status   MRSA by PCR Next Gen NOT DETECTED NOT DETECTED Final    Comment: (NOTE) The GeneXpert MRSA Assay (FDA approved for NASAL specimens only), is one component of a comprehensive MRSA colonization surveillance program. It is not intended to diagnose MRSA infection nor to guide or monitor treatment for MRSA infections. Test performance is not FDA approved in patients less than 71 years old. Performed at [facility], [address], [place], [place] [address]   Respiratory (~20 pathogens) panel by PCR     Status: None   Collection Time: 02/11/23  6:14 PM   Specimen: Nasopharyngeal Swab; Respiratory  Result Value Ref Range Status   Adenovirus NOT DETECTED NOT DETECTED Final   Coronavirus 229E NOT DETECTED NOT DETECTED Final    Comment: (NOTE) The Coronavirus on the Respiratory Panel, DOES NOT test for the novel  Coronavirus (2019 nCoV)     Coronavirus HKU1 NOT DETECTED NOT DETECTED Final   Coronavirus NL63 NOT DETECTED NOT DETECTED Final   Coronavirus OC43 NOT DETECTED NOT DETECTED Final   Metapneumovirus NOT DETECTED NOT DETECTED Final   Rhinovirus / Enterovirus NOT DETECTED NOT DETECTED Final   Influenza A NOT DETECTED NOT DETECTED Final   Influenza B NOT DETECTED NOT DETECTED Final   Parainfluenza Virus 1 NOT DETECTED NOT DETECTED Final   Parainfluenza Virus 2 NOT DETECTED NOT DETECTED Final   Parainfluenza Virus 3 NOT DETECTED NOT DETECTED Final   Parainfluenza Virus 4 NOT DETECTED NOT DETECTED Final   Respiratory Syncytial Virus NOT DETECTED NOT DETECTED Final   Bordetella pertussis NOT DETECTED NOT DETECTED Final   Bordetella Parapertussis NOT DETECTED NOT DETECTED Final   Chlamydophila pneumoniae NOT DETECTED NOT DETECTED Final   Mycoplasma pneumoniae NOT DETECTED NOT DETECTED Final    Comment: Performed at [facility], [address] [place], [place] [address]      Radiology Studies: No results found.  LOS: 7 days    Carma Leaven DO [facility] 02/17/2023, 7:14 AM   If 7PM-7AM, please contact night-coverage [email or web address]

## 2023-02-17 NOTE — Evaluation (Signed)
Modified Barium Swallow Study  Patient Details  Name: Shannon [name] MRN: [id number] Date of Birth: Apr 18, 1941  Today's Date: 02/17/2023  HPI/PMH: HPI: Patient is an 82 y.o. female with PMH: medullary thyroid cancer, oropharyngeal dysphagia s/p PEG, L breast cancer, lung cancer who presented to [facility] with multifocal PNA, requring HFNC and started on antibiotics. She developed progressive agitation/intoleration for NIV secondary to agitated delirium and was started on Precedex. Of note, her spouse of 60 years passed away last month which has understandably been difficult for her.  Patient had G-tube changed to J tube to decrease aspiration risk.  It was recommended she be n.p.o. except water as of evaluation on April 16.  Follow-up to assess readiness for MBS.   Clinical Impression: Clinical Impression: Patient presents with moderately severe oropharyngeal dysphagia with worsening function since her 2021 MBS *likely due to deconditoning, muscle mass loss and ongoing iatrogenic effects of XRT. Wet phonation heard at baseline, indicative of poor secretion management/secretion aspiration.Testing was limited to only thin and nectar liquids due to pt's tenous respiratory status and level of dysphagia. She demonstrates weakness across all swallow musculature - Did not perform adequate suction on straw and right labial loss noted with thin via tsp. Delayed oral transiting with discoordination noted with swallow triggered at pyriform sinus Oral secretions spilled into pharynx and mixed with retained barium. Laryngeal penetration of thin noted and significant pharyngeal retention remained despite multiple dry swallows.  Cued cough nor expectoration effectively cleared barium from pharynx.  In A-P view, retention appeared bilateral.  Pt does not sense retention but makes great effort to dry swallow to clear.  Head of chair reclined to approx 40* without pillow - marginally improved containing retention in posterior  pharynx - but doubtful this would consistently be helpful and with pt's oral control issues -may be more precarious then positive.  At this time, recommend pt continue single ice chips *allow to FULLY MELT* and small amounts of water with dry swallows and oral suctioning PRN.  Pt admits to coughing with intake at home PTA - thus suspect chronic aspiration *of even secretions* for which she has managed.  Her prognosis tolerance of ongoing aspiration at this time is guarded to poor given level of deconditoning and pulmonary status.  Reviewed MBS and recommendations with pt using teach back.  Factors that may increase risk of adverse event in presence of aspiration Rubye Oaks & Clearance Coots 2021): Factors that may increase risk of adverse event in presence of aspiration Rubye Oaks & Clearance Coots 2021): Poor general health and/or compromised immunity; Frail or deconditioned; Limited mobility; Weak cough   Recommendations/Plan: Swallowing Evaluation Recommendations Swallowing Evaluation Recommendations Recommendations: Ice chips PRN after oral care; Free water protocol after oral care Medication Administration: Via alternative means Supervision: Full supervision/cueing for swallowing strategies Swallowing strategies  : Small bites/sips; effortful swallow; Multiple dry swallows after each bite/sip (If reflexively coughing, cough and expectorate as able) Postural changes: Stay upright 30-60 min after meals Oral care recommendations: Oral care QID (4x/day)    Treatment Plan Treatment Plan Treatment recommendations: Therapy as outlined in treatment plan below Follow-up recommendations: Follow physicians's recommendations for discharge plan and follow up therapies Functional status assessment: Patient has had a recent decline in their functional status and/or demonstrates limited ability to make significant improvements in function in a reasonable and predictable amount of time. Treatment frequency: Min  1x/week Treatment duration: 1 week Interventions: Aspiration precaution training; Compensatory techniques; Patient/family education     Recommendations Recommendations for follow up therapy  are one component of a multi-disciplinary discharge planning process, led by the attending physician.  Recommendations may be updated based on patient status, additional functional criteria and insurance authorization.  Assessment: Orofacial Exam: Orofacial Exam Oral Cavity: Oral Hygiene: Pooled secretions Oral Cavity - Dentition: Adequate natural dentition Orofacial Anatomy: WFL    Anatomy:  Anatomy: WFL   Boluses Administered: Boluses Administered Boluses Administered: Thin liquids (Level 0); Mildly thick liquids (Level 2, nectar thick)     Oral Impairment Domain: Oral Impairment Domain Lip Closure: Escape beyond mid-chin Tongue control during bolus hold: Cohesive bolus between tongue to palatal seal Bolus transport/lingual motion: Repetitive/disorganized tongue motion Oral residue: Trace residue lining oral structures; Residue collection on oral structures Location of oral residue : Tongue Initiation of pharyngeal swallow : Pyriform sinuses     Pharyngeal Impairment Domain: Pharyngeal Impairment Domain Soft palate elevation: No bolus between soft palate (SP)/pharyngeal wall (PW) Laryngeal elevation: Partial superior movement of thyroid cartilage/partial approximation of arytenoids to epiglottic petiole Anterior hyoid excursion: Partial anterior movement Epiglottic movement: Partial inversion Laryngeal vestibule closure: Incomplete, narrow column air/contrast in laryngeal vestibule Pharyngeal stripping wave : Present - diminished Pharyngeal contraction (A/P view only): Bilateral bulging Pharyngoesophageal segment opening: Partial distention/partial duration, partial obstruction of flow Tongue base retraction: Wide column of contrast or air between tongue base and PPW Pharyngeal  residue: Minimal to no pharyngeal clearance Location of pharyngeal residue: Pyriform sinuses; Aryepiglottic folds; Pharyngeal wall; Valleculae; Tongue base; Diffuse (>3 areas)     Esophageal Impairment Domain: Esophageal Impairment Domain Esophageal clearance upright position: -- (did not conduct sweep due to minimal amount of barium able to be consumed)    Pill: Esophageal Impairment Domain Esophageal clearance upright position: -- (did not conduct sweep due to minimal amount of barium able to be consumed)    Penetration/Aspiration Scale Score: Penetration/Aspiration Scale Score 6.  Material enters airway, passes BELOW cords then ejected out: Thin liquids (Level 0); Mildly thick liquids (Level 2, nectar thick)    Compensatory Strategies: Compensatory Strategies Compensatory strategies: Yes Straw: Ineffective (pt unable to form suction on straw) Ineffective Straw: Thin liquid (Level 0); Mildly thick liquid (Level 2, nectar thick) Effortful swallow: Effective Effective Effortful Swallow: Thin liquid (Level 0); Mildly thick liquid (Level 2, nectar thick) Multiple swallows: Effective Effective Multiple Swallows: Thin liquid (Level 0); Mildly thick liquid (Level 2, nectar thick) Reclining posture: Effective (partially) Effective Reclining Posture: Mildly thick liquid (Level 2, nectar thick); Thin liquid (Level 0)       General Information: Caregiver present: No   Diet Prior to this Study: NPO; Other (Comment) (Jtube)    Temperature : Normal    Respiratory Status: WFL    Supplemental O2: High flow nasal cannula    History of Recent Intubation: No   Behavior/Cognition: Alert; Cooperative; Pleasant mood  Self-Feeding Abilities: Able to self-feed  Baseline vocal quality/speech: Other (comment) (wet voice)  Volitional Cough: Able to elicit (weak)  Volitional Swallow: Able to elicit  Exam Limitations: No limitations   Goal Planning: Prognosis for improved  oropharyngeal function: Guarded  Barriers to Reach Goals: Time post onset; Severity of deficits; Overall medical prognosis  No data recorded Patient/Family Stated Goal: patient would like to be able to eat some "food" (liquids, puddings, etc)  Consulted and agree with results and recommendations: Patient; Nurse   Pain: Pain Assessment Pain Assessment: 0-10 Faces Pain Scale: 6 Pain Location: back    End of Session: Start Time:SLP Start Time (ACUTE ONLY): 1214  Stop Time: SLP Stop  Time (ACUTE ONLY): 1241  Time Calculation:SLP Time Calculation (min) (ACUTE ONLY): 27 min  Charges: SLP Evaluations $ SLP Speech Visit: 1 Visit  SLP Evaluations $BSS Swallow: 1 Procedure $MBS Swallow: 1 Procedure $Swallowing Treatment: 1 Procedure   SLP visit diagnosis: SLP Visit Diagnosis: Dysphagia, oropharyngeal phase (R13.12); Dysphagia, pharyngoesophageal phase (R13.14)    Past Medical History:  Past Medical History:  Diagnosis Date   Anxiety    Breast cancer 1999   left lumpectomy/radiation/ductal carcinoma in situ   Cancer    Colon polyp    Contact lens/glasses fitting    HAS LENS IMPLANTS   Lung cancer 2015   LYMPHNODE REMOVAL    Medullary carcinoma of thyroid 2011   Osteoporosis    Past Surgical History:  Past Surgical History:  Procedure Laterality Date   ABDOMINAL HYSTERECTOMY     ABDOMINAL SURGERY  1989   RUPTURED APPENDIX   BREAST SURGERY  1999   LEFT BREAST LUMPECTOMY. DUCTAL CARCINOMA IN SITU./ RADIATION   CATARACT EXTRACTION W/ INTRAOCULAR LENS IMPLANT  2009   BOTH EYES   FEMUR IM NAIL Left 10/23/2017   Procedure: INTRAMEDULLARY (IM) NAIL FEMORAL;  Surgeon: Durene Romans, MD;  Location: [facility] OR;  Service: Orthopedics;  Laterality: Left;   FOOT SURGERY  1070   RIGHT FOOT - PINCHED NERVE   IR GASTROSTOMY TUBE MOD SED  01/13/2021   IR REPLACE G-TUBE SIMPLE WO FLUORO  03/08/2022   IR REPLACE G-TUBE SIMPLE WO FLUORO  11/01/2022   IR REPLC DUODEN/JEJUNO TUBE PERCUT  W/FLUORO  02/13/2023   IR REPLC GASTRO/COLONIC TUBE PERCUT W/FLUORO  05/17/2022   LUNG SURGERY     CALCIFICATIONS, POSITIVE - CANCER LYMPHNODE    MELANOMA EXCISION  2008   CHEST AREA   NECK SURGERY  Nov 09 2011   LYMPHNODES REMOVED 2 POSITIVE    RIGHT COLECTOMY  06/02/2009   THYROIDECTOMY  11/06/2009   TONSILLECTOMY AND ADENOIDECTOMY    Rolena Infante, MS [facility] SLP Acute Rehab Services Office [phone]   Chales Abrahams 02/17/2023, 2:39 PM

## 2023-02-17 NOTE — Progress Notes (Signed)
Patient not on HHFNC at this time 

## 2023-02-17 NOTE — TOC Progression Note (Signed)
Transition of Care [facility]) - Progression Note    Patient Details  Name: Shannon [name] MRN: [id number] Date of Birth: 11/22/1940  Transition of Care [facility]) CM/SW Contact  Larrie Kass, LCSW Phone Number: 02/17/2023, 1:49 PM  Clinical Narrative:     CSW spoke with pt's son regarding PT recommendations for Home Health services.PT's son stated he was told his mother would benefit from SNF placement. Pt's son stated his mother thinks she would do better in a facility than going home with home health. CSW explained that short-term rehab will need to be recommended by PT due to needing insurance authorization. CSW reached out to MD and PT for recommendations. TOC to follow.        Expected Discharge Plan and Services                                               Social Determinants of Health (SDOH) Interventions SDOH Screenings   Food Insecurity: No Food Insecurity (02/12/2023)  Housing: Low Risk  (02/12/2023)  Transportation Needs: No Transportation Needs (02/12/2023)  Utilities: Not At Risk (02/12/2023)  Tobacco Use: Medium Risk (02/13/2023)    Readmission Risk Interventions     No data to display

## 2023-02-17 NOTE — Progress Notes (Signed)
Nutrition Brief Note   INTERVENTION:  Speak with RN about TF  Advance patient's TF's to 80 ml/hr x22hrs/day    - Monitor magnesium, potassium, and phosphorus, MD to replete as needed.   - FWF per CCM/MD.                - Would recommend Q4H as appropriate.   - Monitor weight trends.  - Patient and family would like to have patient transition to cyclic feeds to allow time off pump when appropriate. Recommend: Molli Posey 1.4 @ 135mL/hr x16 hours (consider 6pm-10am) Provides 2464 kcal, 108g protein, and free water from formula, consider free water flushes 6x/day for total of 1974mL/day - Can transition slowly as below for better tolerance if needed:  - 44mL/hr x22 hours  - 90mL x19 hours  - x16 hours  ASSESSMENT  Visited patient at bedside who is tolerating Molli Posey @ 70 ml/hr with no issues. Her diarrhea has decreased from 2L to 800 ml in 24hrs with the help of several Imodium per day. Pateint's son at bedside asking about next steps and fiber.   Labs: Glu 178 Meds: insulin, imodium, protonix  Leodis Rains, RDN, LDN  Clinical Nutrition

## 2023-02-18 DIAGNOSIS — J189 Pneumonia, unspecified organism: Secondary | ICD-10-CM | POA: Diagnosis not present

## 2023-02-18 LAB — GLUCOSE, CAPILLARY
Glucose-Capillary: 118 mg/dL — ABNORMAL HIGH (ref 70–99)
Glucose-Capillary: 131 mg/dL — ABNORMAL HIGH (ref 70–99)
Glucose-Capillary: 136 mg/dL — ABNORMAL HIGH (ref 70–99)
Glucose-Capillary: 142 mg/dL — ABNORMAL HIGH (ref 70–99)
Glucose-Capillary: 144 mg/dL — ABNORMAL HIGH (ref 70–99)

## 2023-02-18 MED ORDER — CARVEDILOL 3.125 MG PO TABS
3.1250 mg | ORAL_TABLET | Freq: Two times a day (BID) | ORAL | Status: DC
  Administered 2023-02-18 – 2023-02-20 (×5): 3.125 mg via ORAL
  Filled 2023-02-18 (×5): qty 1

## 2023-02-18 NOTE — [place] FL2 (Signed)
[place] MEDICAID FL2 LEVEL OF CARE FORM     IDENTIFICATION  Patient Name: Shannon [name] Birthdate: 10-Jan-1941 Sex: female Admission Date (Current Location): 02/10/2023  [facility] and [place] Number:  Producer, television/film/video and Address:  [facility],  [address] [place], [place] [address]      Provider Number: [id number]  Attending Physician Name and Address:  Azucena Fallen, MD  Relative Name and Phone Number:  Nyia Tsao son [phone]    Current Level of Care: Hospital Recommended Level of Care: Skilled Nursing Facility Prior Approval Number:    Date Approved/Denied: 02/18/23 PASRR Number: [id number]  Discharge Plan:      Current Diagnoses: Patient Active Problem List   Diagnosis Date Noted   Protein-calorie malnutrition, severe 02/15/2023   Acute respiratory failure with hypoxia 02/13/2023   Delirium 02/13/2023   Need for emotional support 02/13/2023   Palliative care encounter 02/12/2023   Respiratory distress 02/12/2023   Counseling and coordination of care 02/12/2023   Goals of care, counseling/discussion 02/12/2023   Pleural effusion 02/12/2023   Pneumonia due to infectious organism 02/10/2023   Parapneumonic effusion 02/10/2023   Malignant neoplasm metastatic to bone 08/09/2021   Skin lesion 01/15/2020   Age-related osteoporosis without current pathological fracture 07/15/2019   Chronic diarrhea 07/15/2019   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   Medullary carcinoma 07/15/2019   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   Diarrhea 12/12/2017   Hypercalcitonemia 10/24/2017   Thyroid cancer, medullary carcinoma 10/24/2017   Closed left hip fracture 10/23/2017   Hip fracture 10/23/2017   Hydrosalpinx 12/23/2015   Intramural leiomyoma of uterus 12/23/2015   Bloating symptom 08/17/2012   Osteoporosis 08/11/2011   History of breast cancer in female 08/11/2011   Hypothyroidism 08/11/2011   Vitamin D deficiency 08/11/2011    Thyroid cancer 08/11/2011   History of colonic polyps 08/11/2011    Orientation RESPIRATION BLADDER Height & Weight     Self, Time, Situation, Place  Normal Continent Weight: 70 lb 8.8 oz (32 kg) Height:   (162.6 cm)  BEHAVIORAL SYMPTOMS/MOOD NEUROLOGICAL BOWEL NUTRITION STATUS      Continent    AMBULATORY STATUS COMMUNICATION OF NEEDS Skin   Limited Assist   Normal                       Personal Care Assistance Level of Assistance  Bathing, Feeding, Dressing Bathing Assistance: Maximum assistance Feeding assistance: Limited assistance Dressing Assistance: Maximum assistance     Functional Limitations Info  Sight, Hearing, Speech Sight Info: Adequate Hearing Info: Adequate Speech Info: Adequate    SPECIAL CARE FACTORS FREQUENCY  PT (By licensed PT)     PT Frequency: x5              Contractures Contractures Info: Not present    Additional Factors Info  Code Status, Allergies Code Status Info: DNR Allergies Info: Codeine           Current Medications (02/18/2023):  This is the current hospital active medication list Current Facility-Administered Medications  Medication Dose Route Frequency Provider Last Rate Last Admin   acetaminophen (TYLENOL) tablet 650 mg  650 mg Oral Q6H PRN Kirby Crigler, Mir M, MD       Or   acetaminophen (TYLENOL) suppository 650 mg  650 mg Rectal Q6H PRN Kirby Crigler, Mir M, MD       ALPRAZolam Prudy Feeler) tablet 0.5 mg  0.5 mg Per Tube Daily Azucena Fallen, MD  0.5 mg at 02/18/23 4540   ALPRAZolam (XANAX) tablet 1 mg  1 mg Per J Tube Q2000 Azucena Fallen, MD   1 mg at 02/17/23 2034   carvedilol (COREG) tablet 3.125 mg  3.125 mg Oral BID WC Azucena Fallen, MD   3.125 mg at 02/18/23 0820   Chlorhexidine Gluconate Cloth 2 % PADS 6 each  6 each Topical Daily Maryln Gottron, MD   6 each at 02/17/23 2215   enoxaparin (LOVENOX) injection 30 mg  30 mg Subcutaneous Q24H Kirby Crigler, Mir M, MD   30 mg at 02/17/23 2209    feeding supplement (KATE FARMS STANDARD 1.4) liquid 1,680 mL  1,680 mL Per Tube Continuous Azucena Fallen, MD 80 mL/hr at 02/18/23 0602 1,680 mL at 02/18/23 0602   fluticasone (FLONASE) 50 MCG/ACT nasal spray 1 spray  1 spray Each Nare Daily Canary Brim L, NP   1 spray at 02/18/23 0824   insulin aspart (novoLOG) injection 0-15 Units  0-15 Units Subcutaneous Q4H Narda Bonds, MD   2 Units at 02/18/23 0824   latanoprost (XALATAN) 0.005 % ophthalmic solution 1 drop  1 drop Both Eyes QHS Narda Bonds, MD   1 drop at 02/17/23 2210   levalbuterol (XOPENEX) nebulizer solution 0.63 mg  0.63 mg Nebulization Q6H PRN Anthoney Harada, NP       levothyroxine (SYNTHROID) tablet 125 mcg  125 mcg Per J Tube [id number] Narda Bonds, MD   125 mcg at 02/18/23 0554   liver oil-zinc oxide (DESITIN) 40 % ointment   Topical PRN Luiz Iron, NP   Given at 02/12/23 2100   loperamide (IMODIUM) capsule 4 mg  4 mg Oral TID AC & HS Anthoney Harada, NP   4 mg at 02/18/23 1313   loratadine (CLARITIN) tablet 10 mg  10 mg Per Tube Daily Narda Bonds, MD   10 mg at 02/18/23 0820   metoprolol tartrate (LOPRESSOR) injection 5 mg  5 mg Intravenous Q6H PRN Kirby Crigler, Mir M, MD       ondansetron [facility]) injection 4 mg  4 mg Intravenous Q6H PRN Kirby Crigler, Mir M, MD       ondansetron [place]) tablet 8 mg  8 mg Oral Q8H PRN Azucena Fallen, MD       Oral care mouth rinse  15 mL Mouth Rinse 4 times per day Kirby Crigler, Mir M, MD   15 mL at 02/18/23 0825   Oral care mouth rinse  15 mL Mouth Rinse PRN Kirby Crigler, Mir M, MD       pantoprazole (PROTONIX) injection 40 mg  40 mg Intravenous Q12H Narda Bonds, MD   40 mg at 02/18/23 0824   sodium chloride (OCEAN) 0.65 % nasal spray 1 spray  1 spray Each Nare Daily Canary Brim L, NP   1 spray at 02/18/23 0825   traZODone (DESYREL) tablet 25 mg  25 mg Per Tube QHS PRN Narda Bonds, MD   25 mg at 02/17/23 2209     Discharge Medications: Please see discharge  summary for a list of discharge medications.  Relevant Imaging Results:  Relevant Lab Results:   Additional Information Patient's SSN# [id number]  Georgie Chard, LCSW

## 2023-02-18 NOTE — Progress Notes (Signed)
Physical Therapy Treatment Patient Details Name: Shannon [name] MRN: [id number] DOB: 1941/07/12 Today's Date: 02/18/2023   History of Present Illness Patient is a 82 year old female who presented with increased difficulty with breathing. Patient was admitted with respiratory failure, respiratory distress,R pleural effusion, severe sepsis, and multifocal pneumonia.Transthoracic Echocardiogram obtained on 4/14 and is significant for an LVEF of 45-50% with associated grade 1 diastolic dysfunction and no regional wall motion abnormalityPatient was transitioned to 30 L HFNC. PMH: left sided breast cancer, PEG placement, lung cancer,    PT Comments    Pt agreeable to working with therapy. Vitals: 92% 5L, HR 105 bpm, BP 163/79. O2 89% on 5L, HR 108 bpm, dyspnea 2/4 with activity. Cues for pursed lip breathing. Encouraged pt to continue using flutter valve. She tolerated activity fairly well. Per chart review, pt and family feel post acute rehab may be required. Will continue to follow and progress activity as tolerated.    Recommendations for follow up therapy are one component of a multi-disciplinary discharge planning process, led by the attending physician.  Recommendations may be updated based on patient status, additional functional criteria and insurance authorization.  Follow Up Recommendations  Can patient physically be transported by private vehicle: No    Assistance Recommended at Discharge Frequent or constant Supervision/Assistance  Patient can return home with the following A lot of help with walking and/or transfers;A lot of help with bathing/dressing/bathroom;Assistance with cooking/housework;Assist for transportation;Help with stairs or ramp for entrance   Equipment Recommendations   (TBD at next venue)    Recommendations for Other Services       Precautions / Restrictions Precautions Precautions: Fall Precaution Comments: monitor O2, HR; PEG; flexiseal Restrictions Weight  Bearing Restrictions: No     Mobility  Bed Mobility Overal bed mobility: Needs Assistance Bed Mobility: Rolling, Sidelying to Sit, Sit to Sidelying Rolling: Min guard Sidelying to sit: Min guard     Sit to sidelying: Min assist General bed mobility comments: Assist for LEs. Increased time. Cues provided. Close monitoring of lines    Transfers Overall transfer level: Needs assistance Equipment used: Standard walker Transfers: Sit to/from Stand Sit to Stand: Min assist           General transfer comment: x 2. Assist to rise, steady, control descent. Cues for safety, hand placement.    Ambulation/Gait Min Assist               General Gait Details: Pre-gait marching and side steps along bedside with standard walker. Cues provided as needed. O2 89% on 5L, HR 108 bpm, dyspnea 2/4 with activity   Stairs             Wheelchair Mobility    Modified Rankin (Stroke Patients Only)       Balance Overall balance assessment: Needs assistance         Standing balance support: During functional activity, Bilateral upper extremity supported Standing balance-Leahy Scale: Poor                              Cognition Arousal/Alertness: Awake/alert Behavior During Therapy: WFL for tasks assessed/performed Overall Cognitive Status: Within Functional Limits for tasks assessed                                 General Comments: patient was very soft spoken-difficult to understand at times . pleasant. participates  well.        Exercises      General Comments        Pertinent Vitals/Pain Pain Assessment Pain Assessment: No/denies pain    Home Living                          Prior Function            PT Goals (current goals can now be found in the care plan section) Progress towards PT goals: Progressing toward goals    Frequency    Min 1X/week      PT Plan Discharge plan needs to be updated     Co-evaluation              AM-PAC PT "6 Clicks" Mobility   Outcome Measure  Help needed turning from your back to your side while in a flat bed without using bedrails?: A Little Help needed moving from lying on your back to sitting on the side of a flat bed without using bedrails?: A Little Help needed moving to and from a bed to a chair (including a wheelchair)?: A Lot Help needed standing up from a chair using your arms (e.g., wheelchair or bedside chair)?: A Lot Help needed to walk in hospital room?: Total Help needed climbing 3-5 steps with a railing? : Total 6 Click Score: 12    End of Session Equipment Utilized During Treatment: Oxygen Activity Tolerance: Patient tolerated treatment well;Patient limited by fatigue Patient left: in bed;with call bell/phone within reach (pt declined transfer to recliner-uncomfortable for long periods)   PT Visit Diagnosis: Muscle weakness (generalized) (M62.81);Difficulty in walking, not elsewhere classified (R26.2)     Time: 4098-1191 PT Time Calculation (min) (ACUTE ONLY): 29 min  Charges:  $Therapeutic Activity: 23-37 mins                        Faye Ramsay, PT Acute Rehabilitation  Office: [phone]

## 2023-02-18 NOTE — TOC Progression Note (Signed)
Transition of Care [facility]) - Progression Note    Patient Details  Name: Shannon [name] MRN: [id number] Date of Birth: 1940/12/17  [facility] CM/SW Contact  Georgie Chard, LCSW Phone Number: 02/18/2023, 3:53 PM  Clinical Narrative:    CSW has spoke to the patient's son In regards to SNF placement. AT this time PT has recommended SNF. The son is aware of the process. CSW has faxed the patient out at this time. TOC will continue to follow the patient and present bed offers as they come.          Expected Discharge Plan and Services                                               Social Determinants of Health (SDOH) Interventions SDOH Screenings   Food Insecurity: No Food Insecurity (02/12/2023)  Housing: Low Risk  (02/12/2023)  Transportation Needs: No Transportation Needs (02/12/2023)  Utilities: Not At Risk (02/12/2023)  Tobacco Use: Medium Risk (02/13/2023)    Readmission Risk Interventions     No data to display

## 2023-02-18 NOTE — Progress Notes (Signed)
PROGRESS NOTE    Shannon [name]  ZOX:[id number] DOB: 1941/06/28 DOA: 02/10/2023 PCP: Richmond Campbell., PA-C   Brief Narrative: Shannon [name] is a 82 y.o. 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 Patient presented secondary to difficulty breathing and was found to have evidence of multifocal pneumonia in addition to respiratory distress and respiratory failure. Empiric antibiotics started, blood cultures obtained and supplemental oxygen/BiPAP provided. Likely etiology is aspiration. PEG tube converted to G-J tube.   Assessment and Plan:  Severe sepsis secondary to multifocal pneumonia versus aspiration pneumonia -Present on admission with noted tachypnea and tachycardia respiratory distress/failure with presumed pneumonia -Complicated by right-sided pleural effusion status post thoracentesis -Continue Zosyn x 5 days, patient was not improving on initial antibiotics azithromycin/ceftriaxone -Cultures pending, remain negative  Acute respiratory failure with hypoxia, resolving Respiratory distress -Secondary to above multifocal/aspiration pneumonia and right-sided pleural effusion presumed to be parapneumonic -Initially BiPAP dependent, continue to wean oxygen aggressively given her ongoing improvement - currently on nasal canula - continue ambulatory O2 screening -Flutter and incentive spirometry ongoing  Right pleural effusion -Thoracentesis with 500 mL hazy fluid aspirated medial lights criteria consistent with parapneumonic effusion -Cultures remain preliminary negative, cytology negative for malignant cells -No indication for repeat imaging given ongoing improvement in respiratory status and oxygen  Acute combined systolic and diastolic heart failure Hypertension Tachycardia -Transthoracic Echocardiogram obtained on 4/14 and is significant for an LVEF of 45-50% with  associated grade 1 diastolic dysfunction and no regional wall motion abnormality. -Improving with diuresis -initiate carvedilol low dose for tachycardia/hypertension  Urinary retention Foley removed, continue to follow urine output -Continues to have postvoid residual but this may be her baseline, usually low-volume around 100 cc and asymptomatic  Hypokalemia Continue to follow replete as appropriate  Hypothyroidism -Continue Synthroid  Chronic diarrhea Possible related to tube feeds. Stable. Resume home Imodium  Chronic oropharyngeal dysphagia Underweight Dysphagia related to prior neck surgery. PEG tube intact at intake, evidence of significant reflux noted on CT imaging. IR consulted and converted G tube to G/J tube on 4/15. -Continue tube feedings -SLP consult per patient request - allowed sips of water/ice for now; remains high risk for aspiration.  Patient understands the risks of ongoing p.o. intake  Medullary thyroid cancer Patient follows with Dr. Candise Che as an outpatient. Not currently on therapy.  Breast cancer Lung cancer Noted history.  DVT prophylaxis: Lovenox Code Status:   Code Status: DNR Family Communication: Sister in law at bedside Disposition Plan: Discharge to SNF pending bed/insurance availability  Consultants:  Palliative care medicine PCCM  Procedures:  4/13: Right thoracentesis 4/14: Conversion of gastrostomy to single-lumen gastrojejunostomy catheter  Antimicrobials: Ceftriaxone Azithromycin Zosyn IV   Subjective: Patient with anxiety overnight. No dyspnea.  Objective: BP (!) 149/64   Pulse (!) 116   Temp 97.8 F (36.6 C) (Oral)   Resp (!) 24   Ht 5\' 4"  (1.626 m)   Wt 32 kg   SpO2 95%   BMI 12.11 kg/m   Examination:  General exam: Appears calm and comfortable. Thin/frail/cachectic appearing Respiratory system: Rales on auscultation. Respiratory effort normal Cardiovascular system: S1 & S2 heard, tachycardia, normal  rhythm Gastrointestinal system: Abdomen is nondistended, soft and nontender. Normal bowel sounds heard. Central nervous system: Alert and oriented. Musculoskeletal: No edema. No calf tenderness Psychiatry: Judgement and insight appear normal. Mood & affect appropriate.    Data Reviewed: I have personally  reviewed following labs and imaging studies  CBC Lab Results  Component Value Date   WBC 12.8 (H) 02/16/2023   RBC 4.96 02/16/2023   HGB 12.8 02/16/2023   HCT 42.1 02/16/2023   MCV 84.9 02/16/2023   MCH 25.8 (L) 02/16/2023   PLT 357 02/16/2023   MCHC 30.4 02/16/2023   RDW 17.2 (H) 02/16/2023   LYMPHSABS 0.4 (L) 02/14/2023   MONOABS 0.6 02/14/2023   EOSABS 0.1 02/14/2023   BASOSABS 0.1 02/14/2023     Last metabolic panel Lab Results  Component Value Date   NA 140 02/16/2023   K 3.9 02/16/2023   CL 108 02/16/2023   CO2 25 02/16/2023   BUN 18 02/16/2023   CREATININE 0.71 02/17/2023   GLUCOSE 178 (H) 02/16/2023   GFRNONAA >60 02/17/2023   GFRAA >60 06/22/2020   CALCIUM 8.1 (L) 02/16/2023   PHOS 2.9 02/12/2023   PROT 6.3 (L) 02/12/2023   ALBUMIN 2.5 (L) 02/12/2023   BILITOT 0.6 02/12/2023   ALKPHOS 111 02/12/2023   AST 25 02/12/2023   ALT 27 02/12/2023   ANIONGAP 7 02/16/2023    GFR: Estimated Creatinine Clearance: 27.4 mL/min (by C-G formula based on SCr of 0.71 mg/dL).  Recent Results (from the past 240 hour(s))  SARS Coronavirus 2 by RT PCR (hospital order, performed in [facility] hospital lab) *cepheid single result test* Anterior Nasal Swab     Status: None   Collection Time: 02/10/23  1:17 PM   Specimen: Anterior Nasal Swab  Result Value Ref Range Status   SARS Coronavirus 2 by RT PCR NEGATIVE NEGATIVE Final    Comment: (NOTE) SARS-CoV-2 target nucleic acids are NOT DETECTED.  The SARS-CoV-2 RNA is generally detectable in upper and lower respiratory specimens during the acute phase of infection. The lowest concentration of SARS-CoV-2 viral copies  this assay can detect is 250 copies / mL. A negative result does not preclude SARS-CoV-2 infection and should not be used as the sole basis for treatment or other patient management decisions.  A negative result may occur with improper specimen collection / handling, submission of specimen other than nasopharyngeal swab, presence of viral mutation(s) within the areas targeted by this assay, and inadequate number of viral copies (<250 copies / mL). A negative result must be combined with clinical observations, patient history, and epidemiological information.  Fact Sheet for Patients:   RoadLapTop.co.za  Fact Sheet for Healthcare Providers: http://kim-miller.com/  This test is not yet approved or  cleared by the [place] FDA and has been authorized for detection and/or diagnosis of SARS-CoV-2 by FDA under an Emergency Use Authorization (EUA).  This EUA will remain in effect (meaning this test can be used) for the duration of the COVID-19 declaration under Section 564(b)(1) of the Act, 21 U.S.C. section 360bbb-3(b)(1), unless the authorization is terminated or revoked sooner.  Performed at [facility], [address] [place], [place] [address]   Blood Culture (routine x 2)     Status: None   Collection Time: 02/10/23  1:17 PM   Specimen: BLOOD  Result Value Ref Range Status   Specimen Description   Final    BLOOD RIGHT ANTECUBITAL Performed at [facility], [address], [place], [place] [address]    Special Requests   Final    BOTTLES DRAWN AEROBIC AND ANAEROBIC Blood Culture adequate volume Performed at [facility], [address] [place], [place] [address]    Culture   Final  NO GROWTH 5 DAYS Performed at [facility], [address] [place], [place] [address]    Report Status 02/15/2023 FINAL  Final  Blood Culture (routine x 2)     Status: None    Collection Time: 02/10/23  1:22 PM   Specimen: BLOOD  Result Value Ref Range Status   Specimen Description   Final    BLOOD LEFT ANTECUBITAL Performed at [facility], [address], [place], [place] [address]    Special Requests   Final    BOTTLES DRAWN AEROBIC AND ANAEROBIC Blood Culture adequate volume Performed at [facility], [address], [place], [place] [address]    Culture   Final    NO GROWTH 5 DAYS Performed at [facility], [address] [place], [place] [address]    Report Status 02/15/2023 FINAL  Final  Body fluid culture w Gram Stain     Status: None   Collection Time: 02/11/23 12:50 PM   Specimen: PATH Cytology Pleural fluid  Result Value Ref Range Status   Specimen Description PLEURAL FLUID RIGHT  Final   Special Requests SYRINGE  Final   Gram Stain NO ORGANISMS SEEN  Final   Culture   Final    NO GROWTH 3 DAYS Performed at [facility], [address], [place], [place] [address]    Report Status 02/15/2023 FINAL  Final  MRSA Next Gen by PCR, Nasal     Status: None   Collection Time: 02/11/23  6:13 PM   Specimen: Nasal Mucosa; Nasal Swab  Result Value Ref Range Status   MRSA by PCR Next Gen NOT DETECTED NOT DETECTED Final    Comment: (NOTE) The GeneXpert MRSA Assay (FDA approved for NASAL specimens only), is one component of a comprehensive MRSA colonization surveillance program. It is not intended to diagnose MRSA infection nor to guide or monitor treatment for MRSA infections. Test performance is not FDA approved in patients less than 74 years old. Performed at [facility], [address], [place], [place] [address]   Respiratory (~20 pathogens) panel by PCR     Status: None   Collection Time: 02/11/23  6:14 PM   Specimen: Nasopharyngeal Swab; Respiratory  Result Value Ref Range Status   Adenovirus NOT DETECTED NOT DETECTED Final   Coronavirus 229E NOT DETECTED NOT DETECTED  Final    Comment: (NOTE) The Coronavirus on the Respiratory Panel, DOES NOT test for the novel  Coronavirus (2019 nCoV)    Coronavirus HKU1 NOT DETECTED NOT DETECTED Final   Coronavirus NL63 NOT DETECTED NOT DETECTED Final   Coronavirus OC43 NOT DETECTED NOT DETECTED Final   Metapneumovirus NOT DETECTED NOT DETECTED Final   Rhinovirus / Enterovirus NOT DETECTED NOT DETECTED Final   Influenza A NOT DETECTED NOT DETECTED Final   Influenza B NOT DETECTED NOT DETECTED Final   Parainfluenza Virus 1 NOT DETECTED NOT DETECTED Final   Parainfluenza Virus 2 NOT DETECTED NOT DETECTED Final   Parainfluenza Virus 3 NOT DETECTED NOT DETECTED Final   Parainfluenza Virus 4 NOT DETECTED NOT DETECTED Final   Respiratory Syncytial Virus NOT DETECTED NOT DETECTED Final   Bordetella pertussis NOT DETECTED NOT DETECTED Final   Bordetella Parapertussis NOT DETECTED NOT DETECTED Final   Chlamydophila pneumoniae NOT DETECTED NOT DETECTED Final   Mycoplasma pneumoniae NOT DETECTED NOT DETECTED Final    Comment: Performed at [facility], [address], [place], [place] [address]  Radiology Studies: DG Swallowing Func-Speech Pathology  Result Date: 02/17/2023 Table formatting from the original result was not included. Modified Barium Swallow Study Patient Details Name: BLONDIE RIGGSBEE MRN: [id number] Date of Birth: 11-24-1940 Today's Date: 02/17/2023 HPI/PMH: HPI: Patient is an 82 y.o. female with PMH: medullary thyroid cancer, oropharyngeal dysphagia s/p PEG, L breast cancer, lung cancer who presented to [facility] with multifocal PNA, requring HFNC and started on antibiotics. She developed progressive agitation/intoleration for NIV secondary to agitated delirium and was started on Precedex. Of note, her spouse of 60 years passed away last month which has understandably been difficult for her.  Patient had G-tube changed to J tube to decrease aspiration risk.  It was recommended she be n.p.o. except water as of  evaluation on April 16.  Follow-up to assess readiness for MBS. Clinical Impression: Clinical Impression: Patient presents with moderately severe oropharyngeal dysphagia with worsening function since her 2021 MBS *likely due to deconditoning, muscle mass loss and ongoing iatrogenic effects of XRT. Wet phonation heard at baseline, indicative of poor secretion management/secretion aspiration.Testing was limited to only thin and nectar liquids due to pt's tenous respiratory status and level of dysphagia. She demonstrates weakness across all swallow musculature - Did not perform adequate suction on straw and right labial loss noted with thin via tsp. Delayed oral transiting with discoordination noted with swallow triggered at pyriform sinus Oral secretions spilled into pharynx and mixed with retained barium. Laryngeal penetration of thin noted and significant pharyngeal retention remained despite multiple dry swallows.  Cued cough nor expectoration effectively cleared barium from pharynx.  In A-P view, retention appeared bilateral.  Pt does not sense retention but makes great effort to dry swallow to clear.  Head of chair reclined to approx 40* without pillow - marginally improved containing retention in posterior pharynx - but doubtful this would consistently be helpful and with pt's oral control issues -may be more precarious then positive.  At this time, recommend pt continue single ice chips *allow to FULLY MELT* and small amounts of water with dry swallows and oral suctioning PRN.  Pt admits to coughing with intake at home PTA - thus suspect chronic aspiration *of even secretions* for which she has managed.  Her prognosis tolerance of ongoing aspiration at this time is guarded to poor given level of deconditoning and pulmonary status.  Reviewed MBS and recommendations with pt using teach back. Factors that may increase risk of adverse event in presence of aspiration Rubye Oaks & Clearance Coots 2021): Factors that may  increase risk of adverse event in presence of aspiration Rubye Oaks & Clearance Coots 2021): Poor general health and/or compromised immunity; Frail or deconditioned; Limited mobility; Weak cough Recommendations/Plan: Swallowing Evaluation Recommendations Swallowing Evaluation Recommendations Recommendations: Ice chips PRN after oral care; Free water protocol after oral care Medication Administration: Via alternative means Supervision: Full supervision/cueing for swallowing strategies Swallowing strategies  : Small bites/sips; effortful swallow; Multiple dry swallows after each bite/sip (If reflexively coughing, cough and expectorate as able) Postural changes: Stay upright 30-60 min after meals Oral care recommendations: Oral care QID (4x/day) Treatment Plan Treatment Plan Treatment recommendations: Therapy as outlined in treatment plan below Follow-up recommendations: Follow physicians's recommendations for discharge plan and follow up therapies Functional status assessment: Patient has had a recent decline in their functional status and/or demonstrates limited ability to make significant improvements in function in a reasonable and predictable amount of time. Treatment frequency: Min 1x/week Treatment duration: 1 week Interventions: Aspiration precaution training; Compensatory techniques; Patient/family education Recommendations Recommendations for  follow up therapy are one component of a multi-disciplinary discharge planning process, led by the attending physician.  Recommendations may be updated based on patient status, additional functional criteria and insurance authorization. Assessment: Orofacial Exam: Orofacial Exam Oral Cavity: Oral Hygiene: Pooled secretions Oral Cavity - Dentition: Adequate natural dentition Orofacial Anatomy: WFL Anatomy: Anatomy: WFL Boluses Administered: Boluses Administered Boluses Administered: Thin liquids (Level 0); Mildly thick liquids (Level 2, nectar thick)  Oral Impairment Domain: Oral  Impairment Domain Lip Closure: Escape beyond mid-chin Tongue control during bolus hold: Cohesive bolus between tongue to palatal seal Bolus transport/lingual motion: Repetitive/disorganized tongue motion Oral residue: Trace residue lining oral structures; Residue collection on oral structures Location of oral residue : Tongue Initiation of pharyngeal swallow : Pyriform sinuses  Pharyngeal Impairment Domain: Pharyngeal Impairment Domain Soft palate elevation: No bolus between soft palate (SP)/pharyngeal wall (PW) Laryngeal elevation: Partial superior movement of thyroid cartilage/partial approximation of arytenoids to epiglottic petiole Anterior hyoid excursion: Partial anterior movement Epiglottic movement: Partial inversion Laryngeal vestibule closure: Incomplete, narrow column air/contrast in laryngeal vestibule Pharyngeal stripping wave : Present - diminished Pharyngeal contraction (A/P view only): Bilateral bulging Pharyngoesophageal segment opening: Partial distention/partial duration, partial obstruction of flow Tongue base retraction: Wide column of contrast or air between tongue base and PPW Pharyngeal residue: Minimal to no pharyngeal clearance Location of pharyngeal residue: Pyriform sinuses; Aryepiglottic folds; Pharyngeal wall; Valleculae; Tongue base; Diffuse (>3 areas)  Esophageal Impairment Domain: Esophageal Impairment Domain Esophageal clearance upright position: -- (did not conduct sweep due to minimal amount of barium able to be consumed) Pill: Esophageal Impairment Domain Esophageal clearance upright position: -- (did not conduct sweep due to minimal amount of barium able to be consumed) Penetration/Aspiration Scale Score: Penetration/Aspiration Scale Score 6.  Material enters airway, passes BELOW cords then ejected out: Thin liquids (Level 0); Mildly thick liquids (Level 2, nectar thick) Compensatory Strategies: Compensatory Strategies Compensatory strategies: Yes Straw: Ineffective (pt unable  to form suction on straw) Ineffective Straw: Thin liquid (Level 0); Mildly thick liquid (Level 2, nectar thick) Effortful swallow: Effective Effective Effortful Swallow: Thin liquid (Level 0); Mildly thick liquid (Level 2, nectar thick) Multiple swallows: Effective Effective Multiple Swallows: Thin liquid (Level 0); Mildly thick liquid (Level 2, nectar thick) Reclining posture: Effective (partially) Effective Reclining Posture: Mildly thick liquid (Level 2, nectar thick); Thin liquid (Level 0)   General Information: Caregiver present: No  Diet Prior to this Study: NPO; Other (Comment) (Jtube)   Temperature : Normal   Respiratory Status: WFL   Supplemental O2: High flow nasal cannula   History of Recent Intubation: No  Behavior/Cognition: Alert; Cooperative; Pleasant mood Self-Feeding Abilities: Able to self-feed Baseline vocal quality/speech: Other (comment) (wet voice) Volitional Cough: Able to elicit (weak) Volitional Swallow: Able to elicit Exam Limitations: No limitations Goal Planning: Prognosis for improved oropharyngeal function: Guarded Barriers to Reach Goals: Time post onset; Severity of deficits; Overall medical prognosis No data recorded Patient/Family Stated Goal: patient would like to be able to eat some "food" (liquids, puddings, etc) Consulted and agree with results and recommendations: Patient; Nurse Pain: Pain Assessment Pain Assessment: 0-10 Faces Pain Scale: 6 Pain Location: back End of Session: Start Time:SLP Start Time (ACUTE ONLY): 1214 Stop Time: SLP Stop Time (ACUTE ONLY): 1241 Time Calculation:SLP Time Calculation (min) (ACUTE ONLY): 27 min Charges: SLP Evaluations $ SLP Speech Visit: 1 Visit SLP Evaluations $BSS Swallow: 1 Procedure $MBS Swallow: 1 Procedure $Swallowing Treatment: 1 Procedure SLP visit diagnosis: SLP Visit Diagnosis: Dysphagia, oropharyngeal phase (R13.12); Dysphagia, pharyngoesophageal  phase (R13.14) Past Medical History: Past Medical History: Diagnosis Date  Anxiety    Breast cancer 1999  left lumpectomy/radiation/ductal carcinoma in situ  Cancer   Colon polyp   Contact lens/glasses fitting   HAS LENS IMPLANTS  Lung cancer 2015  LYMPHNODE REMOVAL   Medullary carcinoma of thyroid 2011  Osteoporosis  Past Surgical History: Past Surgical History: Procedure Laterality Date  ABDOMINAL HYSTERECTOMY    ABDOMINAL SURGERY  1989  RUPTURED APPENDIX  BREAST SURGERY  1999  LEFT BREAST LUMPECTOMY. DUCTAL CARCINOMA IN SITU./ RADIATION  CATARACT EXTRACTION W/ INTRAOCULAR LENS IMPLANT  2009  BOTH EYES  FEMUR IM NAIL Left 10/23/2017  Procedure: INTRAMEDULLARY (IM) NAIL FEMORAL;  Surgeon: Durene Romans, MD;  Location: [facility] OR;  Service: Orthopedics;  Laterality: Left;  FOOT SURGERY  1070  RIGHT FOOT - PINCHED NERVE  IR GASTROSTOMY TUBE MOD SED  01/13/2021  IR REPLACE G-TUBE SIMPLE WO FLUORO  03/08/2022  IR REPLACE G-TUBE SIMPLE WO FLUORO  11/01/2022  IR REPLC DUODEN/JEJUNO TUBE PERCUT W/FLUORO  02/13/2023  IR REPLC GASTRO/COLONIC TUBE PERCUT W/FLUORO  05/17/2022  LUNG SURGERY    CALCIFICATIONS, POSITIVE - CANCER LYMPHNODE   MELANOMA EXCISION  2008  CHEST AREA  NECK SURGERY  Nov 09 2011  LYMPHNODES REMOVED 2 POSITIVE   RIGHT COLECTOMY  06/02/2009  THYROIDECTOMY  11/06/2009  TONSILLECTOMY AND ADENOIDECTOMY   Rolena Infante, MS [facility] SLP Acute Rehab Services Office [phone] Chales Abrahams 02/17/2023, 2:40 PM     LOS: 8 days    Carma Leaven DO [facility] 02/18/2023, 7:39 AM   If 7PM-7AM, please contact night-coverage [email or web address]

## 2023-02-19 DIAGNOSIS — J189 Pneumonia, unspecified organism: Secondary | ICD-10-CM | POA: Diagnosis not present

## 2023-02-19 LAB — BASIC METABOLIC PANEL
Anion gap: 7 (ref 5–15)
BUN: 27 mg/dL — ABNORMAL HIGH (ref 8–23)
CO2: 30 mmol/L (ref 22–32)
Calcium: 8.5 mg/dL — ABNORMAL LOW (ref 8.9–10.3)
Chloride: 103 mmol/L (ref 98–111)
Creatinine, Ser: 0.68 mg/dL (ref 0.44–1.00)
GFR, Estimated: >60 mL/min (ref >60)
Glucose, Bld: 112 mg/dL — ABNORMAL HIGH (ref 70–99)
Potassium: 4.6 mmol/L (ref 3.5–5.1)
Sodium: 140 mmol/L (ref 135–145)

## 2023-02-19 LAB — GLUCOSE, CAPILLARY
Glucose-Capillary: 110 mg/dL — ABNORMAL HIGH (ref 70–99)
Glucose-Capillary: 122 mg/dL — ABNORMAL HIGH (ref 70–99)
Glucose-Capillary: 134 mg/dL — ABNORMAL HIGH (ref 70–99)
Glucose-Capillary: 136 mg/dL — ABNORMAL HIGH (ref 70–99)
Glucose-Capillary: 143 mg/dL — ABNORMAL HIGH (ref 70–99)
Glucose-Capillary: 153 mg/dL — ABNORMAL HIGH (ref 70–99)

## 2023-02-19 LAB — CBC
HCT: 43 % (ref 36.0–46.0)
Hemoglobin: 13 g/dL (ref 12.0–15.0)
MCH: 25.4 pg — ABNORMAL LOW (ref 26.0–34.0)
MCHC: 30.2 g/dL (ref 30.0–36.0)
MCV: 84.1 fL (ref 80.0–100.0)
Platelets: 406 10*3/uL — ABNORMAL HIGH (ref 150–400)
RBC: 5.11 MIL/uL (ref 3.87–5.11)
RDW: 17.4 % — ABNORMAL HIGH (ref 11.5–15.5)
WBC: 9.4 10*3/uL (ref 4.0–10.5)
nRBC: 0 % (ref 0.0–0.2)

## 2023-02-19 MED ORDER — KATE FARMS STANDARD 1.4 PO LIQD
1680.0000 mL | ORAL | Status: DC
  Administered 2023-02-20: 650 mL
  Filled 2023-02-19 (×2): qty 1950

## 2023-02-19 NOTE — Plan of Care (Signed)
  Problem: Education: Goal: Knowledge of General Education information will improve Description Including pain rating scale, medication(s)/side effects and non-pharmacologic comfort measures Outcome: Progressing   Problem: Health Behavior/Discharge Planning: Goal: Ability to manage health-related needs will improve Outcome: Progressing   

## 2023-02-19 NOTE — Progress Notes (Signed)
PROGRESS NOTE    Shannon [name]  ZOX:[id number] DOB: 1941/01/25 DOA: 02/10/2023 PCP: Richmond Campbell., PA-C   Brief Narrative: Shannon [name] is a 82 y.o. 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 Patient presented secondary to difficulty breathing and was found to have evidence of multifocal pneumonia in addition to respiratory distress and respiratory failure. Empiric antibiotics started, blood cultures obtained and supplemental oxygen/BiPAP provided. Likely etiology is aspiration. PEG tube converted to G-J tube.   Assessment and Plan:  Severe sepsis secondary to multifocal pneumonia versus aspiration pneumonia -Present on admission with noted tachypnea and tachycardia respiratory distress/failure with presumed pneumonia -Complicated by right-sided pleural effusion status post thoracentesis -Continue Zosyn x 5 days, patient was not improving on initial antibiotics azithromycin/ceftriaxone -Cultures pending, remain negative  Acute respiratory failure with hypoxia, resolving Respiratory distress -Secondary to above multifocal/aspiration pneumonia and right-sided pleural effusion presumed to be parapneumonic -Initially BiPAP dependent, continue to wean oxygen aggressively given her ongoing improvement - currently on nasal canula - continue ambulatory O2 screening -Flutter and incentive spirometry ongoing  Right pleural effusion -Thoracentesis with 500 mL hazy fluid aspirated medial lights criteria consistent with parapneumonic effusion -Cultures remain preliminary negative, cytology negative for malignant cells -No indication for repeat imaging given ongoing improvement in respiratory status and oxygen  Acute combined systolic and diastolic heart failure Hypertension Tachycardia -Transthoracic Echocardiogram obtained on 4/14 and is significant for an LVEF of 45-50% with  associated grade 1 diastolic dysfunction and no regional wall motion abnormality. -Improving with diuresis -initiate carvedilol low dose for tachycardia/hypertension  Urinary retention Foley removed, continue to follow urine output -Continues to have postvoid residual but this may be her baseline, usually low-volume around 100 cc and asymptomatic  Hypokalemia Continue to follow replete as appropriate  Hypothyroidism -Continue Synthroid  Chronic diarrhea Possible related to tube feeds. Stable. Resume home Imodium  Chronic oropharyngeal dysphagia Underweight Dysphagia related to prior neck surgery. PEG tube intact at intake, evidence of significant reflux noted on CT imaging. IR consulted and converted G tube to G/J tube on 4/15. -Continue tube feedings -increase rate as tolerated per dietary -SLP consult per patient request - allowed sips of water/ice for now; remains high risk for aspiration.  Patient understands the risks of ongoing p.o. intake  Medullary thyroid cancer Patient follows with Dr. Candise Che as an outpatient. Not currently on therapy.  Breast cancer Lung cancer Noted history.  DVT prophylaxis: Lovenox Code Status:   Code Status: DNR Family Communication: Family updated over the phone Disposition Plan: Discharge to SNF pending bed/insurance availability  Consultants:  Palliative care medicine PCCM  Procedures:  4/13: Right thoracentesis 4/14: Conversion of gastrostomy to single-lumen gastrojejunostomy catheter  Antimicrobials: Ceftriaxone Azithromycin Zosyn IV   Subjective: No acute issues or events overnight, denies nausea vomiting constipation headache fever chills or chest pain.  Remains in good spirits, hoping to continue to advance p.o. intake to maintain or increase safe intake in the future.  Objective: BP (!) 120/57   Pulse (!) 103   Temp 97.7 F (36.5 C) (Oral)   Resp (!) 26   Ht 5\' 4"  (1.626 m)   Wt 31.2 kg   SpO2 93%   BMI 11.81 kg/m    Examination:  General exam: Appears calm and comfortable. Thin/frail/cachectic appearing Respiratory system: Rales on auscultation. Respiratory effort normal Cardiovascular system: S1 & S2 heard, tachycardia, normal rhythm Gastrointestinal system: Abdomen is nondistended,  soft and nontender. Normal bowel sounds heard. Central nervous system: Alert and oriented. Musculoskeletal: No edema. No calf tenderness Psychiatry: Judgement and insight appear normal. Mood & affect appropriate.    Data Reviewed: I have personally reviewed following labs and imaging studies  CBC Lab Results  Component Value Date   WBC 9.4 02/19/2023   RBC 5.11 02/19/2023   HGB 13.0 02/19/2023   HCT 43.0 02/19/2023   MCV 84.1 02/19/2023   MCH 25.4 (L) 02/19/2023   PLT 406 (H) 02/19/2023   MCHC 30.2 02/19/2023   RDW 17.4 (H) 02/19/2023   LYMPHSABS 0.4 (L) 02/14/2023   MONOABS 0.6 02/14/2023   EOSABS 0.1 02/14/2023   BASOSABS 0.1 02/14/2023     Last metabolic panel Lab Results  Component Value Date   NA 140 02/16/2023   K 3.9 02/16/2023   CL 108 02/16/2023   CO2 25 02/16/2023   BUN 18 02/16/2023   CREATININE 0.71 02/17/2023   GLUCOSE 178 (H) 02/16/2023   GFRNONAA >60 02/17/2023   GFRAA >60 06/22/2020   CALCIUM 8.1 (L) 02/16/2023   PHOS 2.9 02/12/2023   PROT 6.3 (L) 02/12/2023   ALBUMIN 2.5 (L) 02/12/2023   BILITOT 0.6 02/12/2023   ALKPHOS 111 02/12/2023   AST 25 02/12/2023   ALT 27 02/12/2023   ANIONGAP 7 02/16/2023    GFR: Estimated Creatinine Clearance: 26.7 mL/min (by C-G formula based on SCr of 0.71 mg/dL).  Recent Results (from the past 240 hour(s))  SARS Coronavirus 2 by RT PCR (hospital order, performed in [facility] hospital lab) *cepheid single result test* Anterior Nasal Swab     Status: None   Collection Time: 02/10/23  1:17 PM   Specimen: Anterior Nasal Swab  Result Value Ref Range Status   SARS Coronavirus 2 by RT PCR NEGATIVE NEGATIVE Final    Comment:  (NOTE) SARS-CoV-2 target nucleic acids are NOT DETECTED.  The SARS-CoV-2 RNA is generally detectable in upper and lower respiratory specimens during the acute phase of infection. The lowest concentration of SARS-CoV-2 viral copies this assay can detect is 250 copies / mL. A negative result does not preclude SARS-CoV-2 infection and should not be used as the sole basis for treatment or other patient management decisions.  A negative result may occur with improper specimen collection / handling, submission of specimen other than nasopharyngeal swab, presence of viral mutation(s) within the areas targeted by this assay, and inadequate number of viral copies (<250 copies / mL). A negative result must be combined with clinical observations, patient history, and epidemiological information.  Fact Sheet for Patients:   RoadLapTop.co.za  Fact Sheet for Healthcare Providers: http://kim-miller.com/  This test is not yet approved or  cleared by the [place] FDA and has been authorized for detection and/or diagnosis of SARS-CoV-2 by FDA under an Emergency Use Authorization (EUA).  This EUA will remain in effect (meaning this test can be used) for the duration of the COVID-19 declaration under Section 564(b)(1) of the Act, 21 U.S.C. section 360bbb-3(b)(1), unless the authorization is terminated or revoked sooner.  Performed at [facility], [address] [place], [place] [address]   Blood Culture (routine x 2)     Status: None   Collection Time: 02/10/23  1:17 PM   Specimen: BLOOD  Result Value Ref Range Status   Specimen Description   Final    BLOOD RIGHT ANTECUBITAL Performed at [facility], [address] [place], [place] [address]    Special Requests  Final    BOTTLES DRAWN AEROBIC AND ANAEROBIC Blood Culture adequate volume Performed at [facility], [address],  [place], [place] [address]    Culture   Final    NO GROWTH 5 DAYS Performed at [facility], [address] [place], [place] [address]    Report Status 02/15/2023 FINAL  Final  Blood Culture (routine x 2)     Status: None   Collection Time: 02/10/23  1:22 PM   Specimen: BLOOD  Result Value Ref Range Status   Specimen Description   Final    BLOOD LEFT ANTECUBITAL Performed at [facility], [address] [place], [place] [address]    Special Requests   Final    BOTTLES DRAWN AEROBIC AND ANAEROBIC Blood Culture adequate volume Performed at [facility], [address], [place], [place] [address]    Culture   Final    NO GROWTH 5 DAYS Performed at [facility], [address], [place], [place] [address]    Report Status 02/15/2023 FINAL  Final  Body fluid culture w Gram Stain     Status: None   Collection Time: 02/11/23 12:50 PM   Specimen: PATH Cytology Pleural fluid  Result Value Ref Range Status   Specimen Description PLEURAL FLUID RIGHT  Final   Special Requests SYRINGE  Final   Gram Stain NO ORGANISMS SEEN  Final   Culture   Final    NO GROWTH 3 DAYS Performed at [facility], [address] [place], [place] [address]    Report Status 02/15/2023 FINAL  Final  MRSA Next Gen by PCR, Nasal     Status: None   Collection Time: 02/11/23  6:13 PM   Specimen: Nasal Mucosa; Nasal Swab  Result Value Ref Range Status   MRSA by PCR Next Gen NOT DETECTED NOT DETECTED Final    Comment: (NOTE) The GeneXpert MRSA Assay (FDA approved for NASAL specimens only), is one component of a comprehensive MRSA colonization surveillance program. It is not intended to diagnose MRSA infection nor to guide or monitor treatment for MRSA infections. Test performance is not FDA approved in patients less than 21 years old. Performed at [facility], [address], [place], [place] [address]   Respiratory (~20 pathogens) panel  by PCR     Status: None   Collection Time: 02/11/23  6:14 PM   Specimen: Nasopharyngeal Swab; Respiratory  Result Value Ref Range Status   Adenovirus NOT DETECTED NOT DETECTED Final   Coronavirus 229E NOT DETECTED NOT DETECTED Final    Comment: (NOTE) The Coronavirus on the Respiratory Panel, DOES NOT test for the novel  Coronavirus (2019 nCoV)    Coronavirus HKU1 NOT DETECTED NOT DETECTED Final   Coronavirus NL63 NOT DETECTED NOT DETECTED Final   Coronavirus OC43 NOT DETECTED NOT DETECTED Final   Metapneumovirus NOT DETECTED NOT DETECTED Final   Rhinovirus / Enterovirus NOT DETECTED NOT DETECTED Final   Influenza A NOT DETECTED NOT DETECTED Final   Influenza B NOT DETECTED NOT DETECTED Final   Parainfluenza Virus 1 NOT DETECTED NOT DETECTED Final   Parainfluenza Virus 2 NOT DETECTED NOT DETECTED Final   Parainfluenza Virus 3 NOT DETECTED NOT DETECTED Final   Parainfluenza Virus 4 NOT DETECTED NOT DETECTED Final   Respiratory Syncytial Virus NOT DETECTED NOT DETECTED Final   Bordetella pertussis NOT DETECTED NOT DETECTED Final   Bordetella Parapertussis NOT DETECTED NOT DETECTED Final  Chlamydophila pneumoniae NOT DETECTED NOT DETECTED Final   Mycoplasma pneumoniae NOT DETECTED NOT DETECTED Final    Comment: Performed at [facility], [address] [place], [place] [address]      Radiology Studies: DG Swallowing Func-Speech Pathology  Result Date: 02/17/2023 Table formatting from the original result was not included. Modified Barium Swallow Study Patient Details Name: KYM SCANNELL MRN: [id number] Date of Birth: [birth year] Today's Date: 02/17/2023 HPI/PMH: HPI: Patient is an 82 y.o. female with PMH: medullary thyroid cancer, oropharyngeal dysphagia s/p PEG, L breast cancer, lung cancer who presented to [facility] with multifocal PNA, requring HFNC and started on antibiotics. She developed progressive agitation/intoleration for NIV secondary to agitated delirium and was started on  Precedex. Of note, her spouse of 60 years passed away last month which has understandably been difficult for her.  Patient had G-tube changed to J tube to decrease aspiration risk.  It was recommended she be n.p.o. except water as of evaluation on April 16.  Follow-up to assess readiness for MBS. Clinical Impression: Clinical Impression: Patient presents with moderately severe oropharyngeal dysphagia with worsening function since her 2021 MBS *likely due to deconditoning, muscle mass loss and ongoing iatrogenic effects of XRT. Wet phonation heard at baseline, indicative of poor secretion management/secretion aspiration.Testing was limited to only thin and nectar liquids due to pt's tenous respiratory status and level of dysphagia. She demonstrates weakness across all swallow musculature - Did not perform adequate suction on straw and right labial loss noted with thin via tsp. Delayed oral transiting with discoordination noted with swallow triggered at pyriform sinus Oral secretions spilled into pharynx and mixed with retained barium. Laryngeal penetration of thin noted and significant pharyngeal retention remained despite multiple dry swallows.  Cued cough nor expectoration effectively cleared barium from pharynx.  In A-P view, retention appeared bilateral.  Pt does not sense retention but makes great effort to dry swallow to clear.  Head of chair reclined to approx 40* without pillow - marginally improved containing retention in posterior pharynx - but doubtful this would consistently be helpful and with pt's oral control issues -may be more precarious then positive.  At this time, recommend pt continue single ice chips *allow to FULLY MELT* and small amounts of water with dry swallows and oral suctioning PRN.  Pt admits to coughing with intake at home PTA - thus suspect chronic aspiration *of even secretions* for which she has managed.  Her prognosis tolerance of ongoing aspiration at this time is guarded to poor  given level of deconditoning and pulmonary status.  Reviewed MBS and recommendations with pt using teach back. Factors that may increase risk of adverse event in presence of aspiration Rubye Oaks & Clearance Coots 2021): Factors that may increase risk of adverse event in presence of aspiration Rubye Oaks & Clearance Coots 2021): Poor general health and/or compromised immunity; Frail or deconditioned; Limited mobility; Weak cough Recommendations/Plan: Swallowing Evaluation Recommendations Swallowing Evaluation Recommendations Recommendations: Ice chips PRN after oral care; Free water protocol after oral care Medication Administration: Via alternative means Supervision: Full supervision/cueing for swallowing strategies Swallowing strategies  : Small bites/sips; effortful swallow; Multiple dry swallows after each bite/sip (If reflexively coughing, cough and expectorate as able) Postural changes: Stay upright 30-60 min after meals Oral care recommendations: Oral care QID (4x/day) Treatment Plan Treatment Plan Treatment recommendations: Therapy as outlined in treatment plan below Follow-up recommendations: Follow physicians's recommendations for discharge plan and follow up therapies Functional status assessment: Patient has had a recent decline in their functional  status and/or demonstrates limited ability to make significant improvements in function in a reasonable and predictable amount of time. Treatment frequency: Min 1x/week Treatment duration: 1 week Interventions: Aspiration precaution training; Compensatory techniques; Patient/family education Recommendations Recommendations for follow up therapy are one component of a multi-disciplinary discharge planning process, led by the attending physician.  Recommendations may be updated based on patient status, additional functional criteria and insurance authorization. Assessment: Orofacial Exam: Orofacial Exam Oral Cavity: Oral Hygiene: Pooled secretions Oral Cavity - Dentition: Adequate  natural dentition Orofacial Anatomy: WFL Anatomy: Anatomy: WFL Boluses Administered: Boluses Administered Boluses Administered: Thin liquids (Level 0); Mildly thick liquids (Level 2, nectar thick)  Oral Impairment Domain: Oral Impairment Domain Lip Closure: Escape beyond mid-chin Tongue control during bolus hold: Cohesive bolus between tongue to palatal seal Bolus transport/lingual motion: Repetitive/disorganized tongue motion Oral residue: Trace residue lining oral structures; Residue collection on oral structures Location of oral residue : Tongue Initiation of pharyngeal swallow : Pyriform sinuses  Pharyngeal Impairment Domain: Pharyngeal Impairment Domain Soft palate elevation: No bolus between soft palate (SP)/pharyngeal wall (PW) Laryngeal elevation: Partial superior movement of thyroid cartilage/partial approximation of arytenoids to epiglottic petiole Anterior hyoid excursion: Partial anterior movement Epiglottic movement: Partial inversion Laryngeal vestibule closure: Incomplete, narrow column air/contrast in laryngeal vestibule Pharyngeal stripping wave : Present - diminished Pharyngeal contraction (A/P view only): Bilateral bulging Pharyngoesophageal segment opening: Partial distention/partial duration, partial obstruction of flow Tongue base retraction: Wide column of contrast or air between tongue base and PPW Pharyngeal residue: Minimal to no pharyngeal clearance Location of pharyngeal residue: Pyriform sinuses; Aryepiglottic folds; Pharyngeal wall; Valleculae; Tongue base; Diffuse (>3 areas)  Esophageal Impairment Domain: Esophageal Impairment Domain Esophageal clearance upright position: -- (did not conduct sweep due to minimal amount of barium able to be consumed) Pill: Esophageal Impairment Domain Esophageal clearance upright position: -- (did not conduct sweep due to minimal amount of barium able to be consumed) Penetration/Aspiration Scale Score: Penetration/Aspiration Scale Score 6.  Material  enters airway, passes BELOW cords then ejected out: Thin liquids (Level 0); Mildly thick liquids (Level 2, nectar thick) Compensatory Strategies: Compensatory Strategies Compensatory strategies: Yes Straw: Ineffective (pt unable to form suction on straw) Ineffective Straw: Thin liquid (Level 0); Mildly thick liquid (Level 2, nectar thick) Effortful swallow: Effective Effective Effortful Swallow: Thin liquid (Level 0); Mildly thick liquid (Level 2, nectar thick) Multiple swallows: Effective Effective Multiple Swallows: Thin liquid (Level 0); Mildly thick liquid (Level 2, nectar thick) Reclining posture: Effective (partially) Effective Reclining Posture: Mildly thick liquid (Level 2, nectar thick); Thin liquid (Level 0)   General Information: Caregiver present: No  Diet Prior to this Study: NPO; Other (Comment) (Jtube)   Temperature : Normal   Respiratory Status: WFL   Supplemental O2: High flow nasal cannula   History of Recent Intubation: No  Behavior/Cognition: Alert; Cooperative; Pleasant mood Self-Feeding Abilities: Able to self-feed Baseline vocal quality/speech: Other (comment) (wet voice) Volitional Cough: Able to elicit (weak) Volitional Swallow: Able to elicit Exam Limitations: No limitations Goal Planning: Prognosis for improved oropharyngeal function: Guarded Barriers to Reach Goals: Time post onset; Severity of deficits; Overall medical prognosis No data recorded Patient/Family Stated Goal: patient would like to be able to eat some "food" (liquids, puddings, etc) Consulted and agree with results and recommendations: Patient; Nurse Pain: Pain Assessment Pain Assessment: 0-10 Faces Pain Scale: 6 Pain Location: back End of Session: Start Time:SLP Start Time (ACUTE ONLY): 1214 Stop Time: SLP Stop Time (ACUTE ONLY): 1241 Time Calculation:SLP Time Calculation (min) (ACUTE  ONLY): 27 min Charges: SLP Evaluations $ SLP Speech Visit: 1 Visit SLP Evaluations $BSS Swallow: 1 Procedure $MBS Swallow: 1 Procedure  $Swallowing Treatment: 1 Procedure SLP visit diagnosis: SLP Visit Diagnosis: Dysphagia, oropharyngeal phase (R13.12); Dysphagia, pharyngoesophageal phase (R13.14) Past Medical History: Past Medical History: Diagnosis Date  Anxiety   Breast cancer 1999  left lumpectomy/radiation/ductal carcinoma in situ  Cancer   Colon polyp   Contact lens/glasses fitting   HAS LENS IMPLANTS  Lung cancer 2015  LYMPHNODE REMOVAL   Medullary carcinoma of thyroid 2011  Osteoporosis  Past Surgical History: Past Surgical History: Procedure Laterality Date  ABDOMINAL HYSTERECTOMY    ABDOMINAL SURGERY  1989  RUPTURED APPENDIX  BREAST SURGERY  1999  LEFT BREAST LUMPECTOMY. DUCTAL CARCINOMA IN SITU./ RADIATION  CATARACT EXTRACTION W/ INTRAOCULAR LENS IMPLANT  2009  BOTH EYES  FEMUR IM NAIL Left 10/23/2017  Procedure: INTRAMEDULLARY (IM) NAIL FEMORAL;  Surgeon: Durene Romans, MD;  Location: [facility] OR;  Service: Orthopedics;  Laterality: Left;  FOOT SURGERY  1070  RIGHT FOOT - PINCHED NERVE  IR GASTROSTOMY TUBE MOD SED  01/13/2021  IR REPLACE G-TUBE SIMPLE WO FLUORO  03/08/2022  IR REPLACE G-TUBE SIMPLE WO FLUORO  11/01/2022  IR REPLC DUODEN/JEJUNO TUBE PERCUT W/FLUORO  02/13/2023  IR REPLC GASTRO/COLONIC TUBE PERCUT W/FLUORO  05/17/2022  LUNG SURGERY    CALCIFICATIONS, POSITIVE - CANCER LYMPHNODE   MELANOMA EXCISION  2008  CHEST AREA  NECK SURGERY  Nov 09 2011  LYMPHNODES REMOVED 2 POSITIVE   RIGHT COLECTOMY  06/02/2009  THYROIDECTOMY  11/06/2009  TONSILLECTOMY AND ADENOIDECTOMY   Rolena Infante, MS [facility] SLP Acute Rehab Services Office [phone] Chales Abrahams 02/17/2023, 2:40 PM     LOS: 9 days    Carma Leaven DO [facility] 02/19/2023, 7:25 AM   If 7PM-7AM, please contact night-coverage [email or web address]

## 2023-02-19 NOTE — TOC Progression Note (Signed)
Transition of Care [facility]) - Progression Note    Patient Details  Name: Shannon [name] MRN: [id number] Date of Birth: 11-26-1940  [facility] of [facility] CM/SW Contact  Georgie Chard, LCSW Phone Number: 02/19/2023, 2:30 PM  Clinical Narrative:     CSW spoke to the son, the son has picked a facility which is [facility]. This CSW spoke to [facility] to confirm offer. Toniann Fail has confirmed patient is able to DC there. The facility will need to start AUTH. TOC will follow up with janie/ facility tomorrow.        Expected Discharge Plan and Services                                               Social Determinants of Health (SDOH) Interventions SDOH Screenings   Food Insecurity: No Food Insecurity (02/12/2023)  Housing: Low Risk  (02/12/2023)  Transportation Needs: No Transportation Needs (02/12/2023)  Utilities: Not At Risk (02/12/2023)  Tobacco Use: Medium Risk (02/13/2023)    Readmission Risk Interventions     No data to display

## 2023-02-19 NOTE — TOC Progression Note (Signed)
Transition of Care [facility]) - Progression Note    Patient Details  Name: Shannon [name] MRN: [id number] Date of Birth: [birth year]  Transition of Care [facility]) CM/SW Contact  Georgie Chard, LCSW Phone Number: 02/19/2023, 9:30 AM  Clinical Narrative:    CSW has contacted the son, at this time the son was driving and unable to take the names of the bed offers. [name] has asked to be called back in 1 hour. This CSW will reach back out. TOC will continue to follow.        Expected Discharge Plan and Services                                               Social Determinants of Health (SDOH) Interventions SDOH Screenings   Food Insecurity: No Food Insecurity (02/12/2023)  Housing: Low Risk  (02/12/2023)  Transportation Needs: No Transportation Needs (02/12/2023)  Utilities: Not At Risk (02/12/2023)  Tobacco Use: Medium Risk (02/13/2023)    Readmission Risk Interventions     No data to display

## 2023-02-19 NOTE — Plan of Care (Signed)
02/19/2023  Problem: Education: Goal: Knowledge of General Education information will improve Description: Including pain rating scale, medication(s)/side effects and non-pharmacologic comfort measures Outcome: Progressing   Problem: Health Behavior/Discharge Planning: Goal: Ability to manage health-related needs will improve Outcome: Progressing   Problem: Clinical Measurements: Goal: Ability to maintain clinical measurements within normal limits will improve Outcome: Progressing Goal: Will remain free from infection Outcome: Progressing Goal: Diagnostic test results will improve Outcome: Progressing Goal: Respiratory complications will improve Outcome: Progressing Goal: Cardiovascular complication will be avoided Outcome: Progressing   Problem: Activity: Goal: Risk for activity intolerance will decrease Outcome: Progressing   Problem: Nutrition: Goal: Adequate nutrition will be maintained Outcome: Progressing   Problem: Coping: Goal: Level of anxiety will decrease Outcome: Progressing   Problem: Elimination: Goal: Will not experience complications related to bowel motility Outcome: Progressing Goal: Will not experience complications related to urinary retention Outcome: Progressing   Problem: Pain Managment: Goal: General experience of comfort will improve Outcome: Progressing   Problem: Safety: Goal: Ability to remain free from injury will improve Outcome: Progressing   Problem: Skin Integrity: Goal: Risk for impaired skin integrity will decrease Outcome: Progressing   Problem: Coping: Goal: Ability to adjust to condition or change in health will improve Outcome: Progressing   Problem: Fluid Volume: Goal: Ability to maintain a balanced intake and output will improve Outcome: Progressing   Problem: Health Behavior/Discharge Planning: Goal: Ability to identify and utilize available resources and services will improve Outcome: Progressing Goal: Ability  to manage health-related needs will improve Outcome: Progressing   Problem: Metabolic: Goal: Ability to maintain appropriate glucose levels will improve Outcome: Progressing   Problem: Nutritional: Goal: Maintenance of adequate nutrition will improve Outcome: Progressing Goal: Progress toward achieving an optimal weight will improve Outcome: Progressing   Problem: Skin Integrity: Goal: Risk for impaired skin integrity will decrease Outcome: Progressing   Problem: Tissue Perfusion: Goal: Adequacy of tissue perfusion will improve Outcome: Progressing   Cindy S. Clelia Croft BSN, RN, CCRP 02/19/2023 4:51 AM

## 2023-02-20 DIAGNOSIS — J189 Pneumonia, unspecified organism: Secondary | ICD-10-CM | POA: Diagnosis not present

## 2023-02-20 DIAGNOSIS — R410 Disorientation, unspecified: Secondary | ICD-10-CM | POA: Diagnosis not present

## 2023-02-20 DIAGNOSIS — E43 Unspecified severe protein-calorie malnutrition: Secondary | ICD-10-CM

## 2023-02-20 DIAGNOSIS — Z7189 Other specified counseling: Secondary | ICD-10-CM | POA: Diagnosis not present

## 2023-02-20 DIAGNOSIS — E039 Hypothyroidism, unspecified: Secondary | ICD-10-CM

## 2023-02-20 DIAGNOSIS — K529 Noninfective gastroenteritis and colitis, unspecified: Secondary | ICD-10-CM | POA: Diagnosis not present

## 2023-02-20 DIAGNOSIS — Z853 Personal history of malignant neoplasm of breast: Secondary | ICD-10-CM

## 2023-02-20 LAB — GLUCOSE, CAPILLARY
Glucose-Capillary: 121 mg/dL — ABNORMAL HIGH (ref 70–99)
Glucose-Capillary: 124 mg/dL — ABNORMAL HIGH (ref 70–99)
Glucose-Capillary: 127 mg/dL — ABNORMAL HIGH (ref 70–99)
Glucose-Capillary: 150 mg/dL — ABNORMAL HIGH (ref 70–99)

## 2023-02-20 MED ORDER — CARVEDILOL 3.125 MG PO TABS: 3.1250 mg | ORAL_TABLET | Freq: Two times a day (BID) | ORAL | 0 refills | Status: DC

## 2023-02-20 MED ORDER — ALPRAZOLAM 1 MG PO TABS: 0.5000 mg | ORAL_TABLET | Freq: Two times a day (BID) | ORAL | 0 refills | Status: DC

## 2023-02-20 MED ORDER — ZINC OXIDE 40 % EX OINT: TOPICAL_OINTMENT | CUTANEOUS | 0 refills | Status: DC | PRN

## 2023-02-20 MED ORDER — FLUTICASONE PROPIONATE 50 MCG/ACT NA SUSP: 1.0000 | Freq: Every day | NASAL | 0 refills | Status: DC

## 2023-02-20 NOTE — Progress Notes (Signed)
Speech Language Pathology Treatment: Dysphagia  Patient Details Name: Shannon [name] MRN: [id number] DOB: 1941/08/02 Today's Date: 02/20/2023 Time: 1914-7829 SLP Time Calculation (min) (ACUTE ONLY): 20 min  Assessment / Plan / Recommendation Clinical Impression  Patient seen by SLP for skilled treatment focused on dysphagia goals. Patient appears more lucid than when this SLP first evaluated her at bedside on 4/16 however she reports being a little confused and she was not able to recall the results of her MBS which the SLP had discussed with her on 02/17/23. SLP briefly reviewed results and recommendations. Patient asked what she should do if she is worried a piece of ice "goes down" before it melts. SLP recommended she try to make sure she is sitting up adequately if she is feeling that she is not swallowing the ice well, she should stop and take a break, but that small amounts of ice/water can be aspirated. SLP then introduced and trained patient on use of EMST device, set at 9cm H20. She was able to return-demonstrate. SLP provided her with printed instructions, recommendations/precautions. SLP will continue to follow patient while admitted.   HPI HPI: Patient is an 82 y.o. female with PMH: medullary thyroid cancer, oropharyngeal dysphagia s/p PEG, L breast cancer, lung cancer who presented to [facility] with multifocal PNA, requring HFNC and started on antibiotics. She developed progressive agitation/intoleration for NIV secondary to agitated delirium and was started on Precedex. Of note, her spouse of 60 years passed away last month which has understandably been difficult for her.  Patient had G-tube changed to J tube to decrease aspiration risk.  It was recommended she be n.p.o. except water as of evaluation on April 16.  Follow-up to assess readiness for MBS.      SLP Plan  Continue with current plan of care      Recommendations for follow up therapy are one component of a multi-disciplinary  discharge planning process, led by the attending physician.  Recommendations may be updated based on patient status, additional functional criteria and insurance authorization.    Recommendations  Diet recommendations: NPO (small ice chips one at a time for comfort/pleasaure) Liquids provided via: Teaspoon Medication Administration: Via alternative means Compensations: Slow rate;Small sips/bites Postural Changes and/or Swallow Maneuvers: Seated upright 90 degrees                  Oral care BID;Staff/trained caregiver to provide oral care;Oral care prior to ice chip/H20   Set up Supervision/Assistance Dysphagia, oropharyngeal phase (R13.12);Dysphagia, pharyngoesophageal phase (R13.14)     Continue with current plan of care     Angela Nevin, MA, CCC-SLP Speech Therapy

## 2023-02-20 NOTE — TOC Transition Note (Signed)
Transition of Care [facility]) - CM/SW Discharge Note  Patient Details  Name: Shannon [name] MRN: [id number] Date of Birth: [birth year]  [facility] CM/SW Contact:  Ewing Schlein, LCSW Phone Number: 02/20/2023, 11:53 AM  Clinical Narrative: Patient will go to [facility] today pending insurance authorization. CSW completed insurance authorization on [employer] portal. Authorization ID # is: [id number]. Patient has been approved for 02/20/2023-02/22/2023. Son notified of discharge and insurance approval.  Patient will go to room 3235 and the number fore report is [id number]. Discharge summary, discharge orders, and SNF transfer report faxed to facility in hub. Medical necessity form done; PTAR scheduled. Discharge packet completed. RN updated. TOC signing off.  Final next level of care: Skilled Nursing Facility Barriers to Discharge: Barriers Resolved  Patient Goals and CMS Choice CMS Medicare.gov Compare Post Acute Care list provided to:: Patient Represenative (must comment) Choice offered to / list presented to : Adult Children  Discharge Placement Existing PASRR number confirmed : 02/18/23          Patient chooses bed at: [facility] Patient to be transferred to facility by: PTAR Name of family member notified: Daryle Boyington (son) Patient and family notified of of transfer: 02/20/23  Discharge Plan and Services Additional resources added to the After Visit Summary for         DME Arranged: N/A DME Agency: NA  Social Determinants of Health (SDOH) Interventions SDOH Screenings   Food Insecurity: No Food Insecurity (02/12/2023)  Housing: Low Risk  (02/12/2023)  Transportation Needs: No Transportation Needs (02/12/2023)  Utilities: Not At Risk (02/12/2023)  Tobacco Use: Medium Risk (02/13/2023)   Readmission Risk Interventions     No data to display

## 2023-02-20 NOTE — Progress Notes (Addendum)
Nutrition Follow-up  DOCUMENTATION CODES:   Severe malnutrition in context of chronic illness  INTERVENTION:  - Continue goal TF regimen:  [employer] 1.4 @ 160mL/hr x16 hours (consider 6pm-10am) Provides 2464 kcal, 108g protein, and free water from formula - Consider free water flushes 6x/day for total of 1931mL/day  - Follow up with outpatient RD for tolerance of TF regimen.   NUTRITION DIAGNOSIS:   Severe Malnutrition related to chronic illness (cancer) as evidenced by severe fat depletion, severe muscle depletion. *ongoing  GOAL:   Patient will meet greater than or equal to 90% of their needs *met with TF  MONITOR:   Diet advancement, Labs, Weight trends, TF tolerance  REASON FOR ASSESSMENT:   Consult Assessment of nutrition requirement/status  ASSESSMENT:   82 year old female with PMHx of left-sided breast cancer, lung cancer, chronic diarrhea, metastatic medullary thyroid carcinoma, G-tube dependence admitted with multifocal PNA.  Met with patient and family at bedside this AM.  TF infusing at goal of 167mL/hr (increased to goal this AM) and patient reports tolerating.  Family at bedside concerned about patient's diarrhea worsening and weight decreasing after change to [facility] 1.4.   Discussed that there are many factors that can affect diarrhea. Patient now receives continuous tube feeds via J tube which could impact bowel movements. Molli Posey also contains fiber (old formula Osmolite 1.5 did not) however would suspect this would aid in bulking of stools instead of increased diarrhea.  Patient has been receiving Imodium QID however family reports she was on this same regimen for imodium at home.   Admit weight: 95#  Current weight: 69# Also discussed that weight has trended down over the past few days however would not suspect such a major decrease in actual body weight over short time period. Especially as current TF regimen is very similar in  calories and protein to patient's previous home TF regimen.  Suspect patient's increased diarrhea could be resulting in fluid losses (patient -1.8L) that are likely affecting weight status.  Will still need to monitor weight trends closely.  As patient just reached goal continuous rate today, discussed continuing current TF regimen another 2 days to assess goal tolerance. If diarrhea does not improve can consider changing back to Osmolite 1.5.  Patient follows with outpatient RD and family has plans to see her soon.   After visit, discharge summary note added to chart. Plan for patient to discharge to SNF today. Recommend close follow up with outpatient RD.   Medications reviewed and include: Insulin, Imodium  Labs reviewed:  HA1C 6.0   Diet Order:   Diet Order             Diet NPO time specified Except for: Other (See Comments)  Diet effective now                   EDUCATION NEEDS:  Education needs have been addressed  Skin:  Skin Assessment: Reviewed RN Assessment  Last BM:  4/22  Height:  Ht Readings from Last 1 Encounters:  02/11/23  (1.626 m)   Weight:  Wt Readings from Last 1 Encounters:  02/19/23 31.2 kg   Ideal Body Weight:  54.5 kg  BMI:  Body mass index is 11.81 kg/m.  Estimated Nutritional Needs:  Kcal:  1650-1850 Protein:  70-85 grams Fluid:  >/= 1.6 L/day    Shelle Iron RD, LDN For contact information, refer to [facility].

## 2023-02-20 NOTE — Plan of Care (Signed)
Pt ready to DC to [facility] via PTAR

## 2023-02-20 NOTE — Care Management Important Message (Signed)
Important Message  Patient Details IM Letter given. Name: LATTIE RIEGE MRN: [id number] Date of Birth: 1941/08/13   Medicare Important Message Given:  Yes     Caren Macadam 02/20/2023, 2:25 PM

## 2023-02-20 NOTE — Progress Notes (Signed)
   02/20/23 0455  Assess: MEWS Score  Temp 98.2 F (36.8 C)  BP (!) 123/54  MAP (mmHg) 74  Pulse Rate (!) 112  Resp 17  SpO2 92 %  O2 Device Nasal Cannula  Assess: MEWS Score  MEWS Temp 0  MEWS Systolic 0  MEWS Pulse 2  MEWS RR 0  MEWS LOC 0  MEWS Score 2  MEWS Score Color Yellow  Assess: if the MEWS score is Yellow or Red  Were vital signs taken at a resting state? Yes  Focused Assessment No change from prior assessment  Does the patient meet 2 or more of the SIRS criteria? No  Does the patient have a confirmed or suspected source of infection? Yes  Provider and Rapid Response Notified? Yes (on call NP J. Garner Nash notified)  MEWS guidelines implemented  Yes, yellow  Treat  MEWS Interventions Considered administering scheduled or prn medications/treatments as ordered  Take Vital Signs  Increase Vital Sign Frequency  Yellow: Q2hr x1, continue Q4hrs until patient remains green for 12hrs  Escalate  MEWS: Escalate Yellow: Discuss with charge nurse and consider notifying provider and/or RRT  Notify: Charge Nurse/RN  Name of Charge Nurse/RN Notified  (discussed with RN Suzette Battiest)  Provider Notification  Provider Name/Title NP J. Garner Nash  Date Provider Notified 02/20/23  Time Provider Notified 0505  Method of Notification  (secure chat)  Notification Reason Other (Comment) (HR 112)  Provider response No new orders (Coreg adjusted previously)  Date of Provider Response 02/20/23  Time of Provider Response 0510  Notify: Rapid Response  Name of Rapid Response RN Notified not necessary at this time  Assess: SIRS CRITERIA  SIRS Temperature  0  SIRS Pulse 1  SIRS Respirations  0  SIRS WBC 0  SIRS Score Sum  1

## 2023-02-20 NOTE — Discharge Summary (Addendum)
Physician Discharge Summary  Shannon [name] RUE:[id number] DOB: 1940/12/04 DOA: 02/10/2023  PCP: Richmond Campbell., PA-C  Admit date: 02/10/2023 Discharge date: 02/20/2023  Admitted From: Home Disposition:  SNF  Recommendations for Outpatient Follow-up:  Follow up with PCP in 1-2 weeks  Discharge Condition:Stable  CODE STATUS:DNR  Diet recommendation: NPO - tube feeds ongoing - patient may use as an alternative Isosource 1.5 tube feeding  Brief/Interim Summary: Shannon [name] is a 82 y.o. 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 Patient presented secondary to difficulty breathing and was found to have evidence of multifocal pneumonia in addition to respiratory distress and respiratory failure. Empiric antibiotics started, blood cultures obtained and supplemental oxygen/BiPAP provided. Likely etiology is aspiration. PEG tube converted to G-J tube.   Patient's respiratory status improved drastically over the past few days - ambulation continues to be limited by respiratory status. PT recommending ongoing evaluation and treatment via SNF. Patient otherwise stable and agreeable for DC - ultimate plan is to discharge home after completing physical therapy at skilled nursing facility.  Patient severe sepsis multifactorial pneumonia in the setting of aspiration pneumonia, acute hypoxic respiratory failure, pleural effusion have all improved drastically, she completed her antibiotic course, pleural effusion drained and has not yet returned.  Hypoxia continues to improve, not yet back to baseline but markedly improved as she was previously BiPAP dependent now on nasal cannula.  Transient episodes of urinary retention appear to be ongoing, likely positional secondary to prolonged bedbound status but improving with ambulation.  Discharge Diagnoses:  Principal Problem:   Pneumonia due to  infectious organism Active Problems:   History of breast cancer in female   Hypothyroidism   Thyroid cancer, medullary carcinoma   Chronic diarrhea   Parapneumonic effusion   Palliative care encounter   Respiratory distress   Counseling and coordination of care   Goals of care, counseling/discussion   Pleural effusion   Acute respiratory failure with hypoxia   Delirium   Need for emotional support   Protein-calorie malnutrition, severe  Severe sepsis secondary to multifocal pneumonia versus aspiration pneumonia -Present on admission with noted tachypnea and tachycardia respiratory distress/failure with presumed pneumonia -Complicated by right-sided pleural effusion status post thoracentesis -Zosyn completed   Acute respiratory failure with hypoxia, resolving Respiratory distress -Secondary to above multifocal/aspiration pneumonia and right-sided pleural effusion presumed to be parapneumonic -Initially BiPAP dependent, continue to wean oxygen aggressively given her ongoing improvement - currently on nasal canula    Right pleural effusion -Thoracentesis with 500 mL hazy fluid aspirated medial lights criteria consistent with parapneumonic effusion -Cultures remain preliminary negative, cytology negative for malignant cells -No indication for repeat imaging given ongoing improvement in respiratory status and oxygen   Acute combined systolic and diastolic heart failure Hypertension Tachycardia -Transthoracic Echocardiogram obtained on 4/14 and is significant for an LVEF of 45-50% with associated grade 1 diastolic dysfunction and no regional wall motion abnormality. -Improving with diuresis -Continue carvedilol   Urinary retention Foley removed, continue to follow urine output -Continues to have postvoid residual but this may be her baseline, usually low-volume around 100 cc and asymptomatic   Hypokalemia Continue to follow replete as appropriate   Hypothyroidism -Continue  Synthroid   Chronic diarrhea Possible related to tube feeds. Stable. Resume home Imodium   Chronic oropharyngeal dysphagia Underweight Dysphagia related to prior neck surgery. PEG tube intact at intake, evidence of significant reflux noted on CT  imaging. IR consulted and converted G tube to G/J tube on 4/15. -Continue tube feedings -increase rate as tolerated per dietary -SLP consult per patient request - allowed sips of water/ice for now; remains high risk for aspiration.  Patient understands the risks of ongoing p.o. intake   Medullary thyroid cancer Patient follows with Dr. Candise Che as an outpatient. Not currently on therapy.   Breast cancer Lung cancer Noted history.  Discharge Instructions   Allergies as of 02/20/2023       Reactions   Codeine Other (See Comments)   hyper Other reaction(s): insomnia        Medication List     STOP taking these medications    HYDROcodone-acetaminophen 5-325 MG tablet Commonly known as: NORCO/VICODIN       TAKE these medications    albuterol 108 (90 Base) MCG/ACT inhaler Commonly known as: VENTOLIN HFA Inhale 1-2 puffs into the lungs every 6 (six) hours as needed for wheezing or shortness of breath.   ALPRAZolam 1 MG tablet Commonly known as: XANAX Take 0.5 tablets (0.5 mg total) by mouth 2 (two) times daily.   bacitracin ophthalmic ointment Place 1 Application into both eyes 3 (three) times daily.   CALCIUM 600 + MINERALS PO Take 1 tablet by mouth daily.   carvedilol 3.125 MG tablet Commonly known as: COREG Take 1 tablet (3.125 mg total) by mouth 2 (two) times daily with a meal.   cetirizine 10 MG tablet Commonly known as: ZYRTEC Take 10 mg by mouth daily.   feeding supplement (OSMOLITE 1.5 CAL) Liqd Take 237 mLs by mouth 5 (five) times daily.   fluticasone 50 MCG/ACT nasal spray Commonly known as: FLONASE Place 1 spray into both nostrils daily.   Glucosamine-Chondroitin 250-200 MG Caps Take 1 tablet by mouth  daily. Name of table is SCHIFF   latanoprost 0.005 % ophthalmic solution Commonly known as: XALATAN Place 1 drop into both eyes at bedtime.   levothyroxine 125 MCG tablet Commonly known as: SYNTHROID Take 1 tablet by mouth once daily   liver oil-zinc oxide 40 % ointment Commonly known as: DESITIN Apply topically as needed for irritation.   loperamide 2 MG tablet Commonly known as: IMODIUM A-D Take 2 mg by mouth 3 (three) times daily.   multivitamin tablet Take 1 tablet by mouth daily.   ondansetron 8 MG tablet Commonly known as: ZOFRAN Take 1 tablet by mouth every 8 hours as needed for nausea or vomiting.   pantoprazole 40 MG tablet Commonly known as: PROTONIX TAKE 1 TABLET BY MOUTH TWICE DAILY BEFORE A MEAL   sucralfate 1 g tablet Commonly known as: CARAFATE TAKE 1 TABLET BY MOUTH 4 TIMES DAILY WITH MEALS AND AT BEDTIME What changed:  how much to take how to take this when to take this   Tylenol PM Extra Strength 25-500 MG Tabs tablet Generic drug: diphenhydramine-acetaminophen Take 1 tablet by mouth at bedtime as needed (sleep / pain).   Vitamin D 1000 units capsule Take 1,000 Units by mouth daily.   vitamin E 180 MG (400 UNITS) capsule Take 400 Units by mouth daily.   zoledronic acid 5 MG/100ML Soln injection Commonly known as: RECLAST Inject 5 mg into the vein once.        Contact information for after-discharge care     Destination     [facility] Preferred SNF .   Service: Skilled Paramedic information: [address] [place] [address] [phone]  Allergies  Allergen Reactions   Codeine Other (See Comments)    hyper Other reaction(s): insomnia    Consultations: PCCM, Palliative   Procedures/Studies: DG Swallowing Func-Speech Pathology  Result Date: 02/17/2023 Table formatting from the original result was not included. Modified Barium Swallow Study  Patient Details Name: Shannon [name] MRN: [id number] Date of Birth: 1941-10-09 Today's Date: 02/17/2023 HPI/PMH: HPI: Patient is an 82 y.o. female with PMH: medullary thyroid cancer, oropharyngeal dysphagia s/p PEG, L breast cancer, lung cancer who presented to [facility] with multifocal PNA, requring HFNC and started on antibiotics. She developed progressive agitation/intoleration for NIV secondary to agitated delirium and was started on Precedex. Of note, her spouse of 60 years passed away last month which has understandably been difficult for her.  Patient had G-tube changed to J tube to decrease aspiration risk.  It was recommended she be n.p.o. except water as of evaluation on April 16.  Follow-up to assess readiness for MBS. Clinical Impression: Clinical Impression: Patient presents with moderately severe oropharyngeal dysphagia with worsening function since her 2021 MBS *likely due to deconditoning, muscle mass loss and ongoing iatrogenic effects of XRT. Wet phonation heard at baseline, indicative of poor secretion management/secretion aspiration.Testing was limited to only thin and nectar liquids due to pt's tenous respiratory status and level of dysphagia. She demonstrates weakness across all swallow musculature - Did not perform adequate suction on straw and right labial loss noted with thin via tsp. Delayed oral transiting with discoordination noted with swallow triggered at pyriform sinus Oral secretions spilled into pharynx and mixed with retained barium. Laryngeal penetration of thin noted and significant pharyngeal retention remained despite multiple dry swallows.  Cued cough nor expectoration effectively cleared barium from pharynx.  In A-P view, retention appeared bilateral.  Pt does not sense retention but makes great effort to dry swallow to clear.  Head of chair reclined to approx 40* without pillow - marginally improved containing retention in posterior pharynx - but doubtful this would consistently be  helpful and with pt's oral control issues -may be more precarious then positive.  At this time, recommend pt continue single ice chips *allow to FULLY MELT* and small amounts of water with dry swallows and oral suctioning PRN.  Pt admits to coughing with intake at home PTA - thus suspect chronic aspiration *of even secretions* for which she has managed.  Her prognosis tolerance of ongoing aspiration at this time is guarded to poor given level of deconditoning and pulmonary status.  Reviewed MBS and recommendations with pt using teach back. Factors that may increase risk of adverse event in presence of aspiration Rubye Oaks & Clearance Coots 2021): Factors that may increase risk of adverse event in presence of aspiration Rubye Oaks & Clearance Coots 2021): Poor general health and/or compromised immunity; Frail or deconditioned; Limited mobility; Weak cough Recommendations/Plan: Swallowing Evaluation Recommendations Swallowing Evaluation Recommendations Recommendations: Ice chips PRN after oral care; Free water protocol after oral care Medication Administration: Via alternative means Supervision: Full supervision/cueing for swallowing strategies Swallowing strategies  : Small bites/sips; effortful swallow; Multiple dry swallows after each bite/sip (If reflexively coughing, cough and expectorate as able) Postural changes: Stay upright 30-60 min after meals Oral care recommendations: Oral care QID (4x/day) Treatment Plan Treatment Plan Treatment recommendations: Therapy as outlined in treatment plan below Follow-up recommendations: Follow physicians's recommendations for discharge plan and follow up therapies Functional status assessment: Patient has had a recent decline in their functional status and/or demonstrates limited ability to make significant improvements in function in a reasonable  and predictable amount of time. Treatment frequency: Min 1x/week Treatment duration: 1 week Interventions: Aspiration precaution training; Compensatory  techniques; Patient/family education Recommendations Recommendations for follow up therapy are one component of a multi-disciplinary discharge planning process, led by the attending physician.  Recommendations may be updated based on patient status, additional functional criteria and insurance authorization. Assessment: Orofacial Exam: Orofacial Exam Oral Cavity: Oral Hygiene: Pooled secretions Oral Cavity - Dentition: Adequate natural dentition Orofacial Anatomy: WFL Anatomy: Anatomy: WFL Boluses Administered: Boluses Administered Boluses Administered: Thin liquids (Level 0); Mildly thick liquids (Level 2, nectar thick)  Oral Impairment Domain: Oral Impairment Domain Lip Closure: Escape beyond mid-chin Tongue control during bolus hold: Cohesive bolus between tongue to palatal seal Bolus transport/lingual motion: Repetitive/disorganized tongue motion Oral residue: Trace residue lining oral structures; Residue collection on oral structures Location of oral residue : Tongue Initiation of pharyngeal swallow : Pyriform sinuses  Pharyngeal Impairment Domain: Pharyngeal Impairment Domain Soft palate elevation: No bolus between soft palate (SP)/pharyngeal wall (PW) Laryngeal elevation: Partial superior movement of thyroid cartilage/partial approximation of arytenoids to epiglottic petiole Anterior hyoid excursion: Partial anterior movement Epiglottic movement: Partial inversion Laryngeal vestibule closure: Incomplete, narrow column air/contrast in laryngeal vestibule Pharyngeal stripping wave : Present - diminished Pharyngeal contraction (A/P view only): Bilateral bulging Pharyngoesophageal segment opening: Partial distention/partial duration, partial obstruction of flow Tongue base retraction: Wide column of contrast or air between tongue base and PPW Pharyngeal residue: Minimal to no pharyngeal clearance Location of pharyngeal residue: Pyriform sinuses; Aryepiglottic folds; Pharyngeal wall; Valleculae; Tongue base;  Diffuse (>3 areas)  Esophageal Impairment Domain: Esophageal Impairment Domain Esophageal clearance upright position: -- (did not conduct sweep due to minimal amount of barium able to be consumed) Pill: Esophageal Impairment Domain Esophageal clearance upright position: -- (did not conduct sweep due to minimal amount of barium able to be consumed) Penetration/Aspiration Scale Score: Penetration/Aspiration Scale Score 6.  Material enters airway, passes BELOW cords then ejected out: Thin liquids (Level 0); Mildly thick liquids (Level 2, nectar thick) Compensatory Strategies: Compensatory Strategies Compensatory strategies: Yes Straw: Ineffective (pt unable to form suction on straw) Ineffective Straw: Thin liquid (Level 0); Mildly thick liquid (Level 2, nectar thick) Effortful swallow: Effective Effective Effortful Swallow: Thin liquid (Level 0); Mildly thick liquid (Level 2, nectar thick) Multiple swallows: Effective Effective Multiple Swallows: Thin liquid (Level 0); Mildly thick liquid (Level 2, nectar thick) Reclining posture: Effective (partially) Effective Reclining Posture: Mildly thick liquid (Level 2, nectar thick); Thin liquid (Level 0)   General Information: Caregiver present: No  Diet Prior to this Study: NPO; Other (Comment) (Jtube)   Temperature : Normal   Respiratory Status: WFL   Supplemental O2: High flow nasal cannula   History of Recent Intubation: No  Behavior/Cognition: Alert; Cooperative; Pleasant mood Self-Feeding Abilities: Able to self-feed Baseline vocal quality/speech: Other (comment) (wet voice) Volitional Cough: Able to elicit (weak) Volitional Swallow: Able to elicit Exam Limitations: No limitations Goal Planning: Prognosis for improved oropharyngeal function: Guarded Barriers to Reach Goals: Time post onset; Severity of deficits; Overall medical prognosis No data recorded Patient/Family Stated Goal: patient would like to be able to eat some "food" (liquids, puddings, etc) Consulted and  agree with results and recommendations: Patient; Nurse Pain: Pain Assessment Pain Assessment: 0-10 Faces Pain Scale: 6 Pain Location: back End of Session: Start Time:SLP Start Time (ACUTE ONLY): 1214 Stop Time: SLP Stop Time (ACUTE ONLY): 1241 Time Calculation:SLP Time Calculation (min) (ACUTE ONLY): 27 min Charges: SLP Evaluations $ SLP Speech Visit: 1 Visit SLP  Evaluations $BSS Swallow: 1 Procedure $MBS Swallow: 1 Procedure $Swallowing Treatment: 1 Procedure SLP visit diagnosis: SLP Visit Diagnosis: Dysphagia, oropharyngeal phase (R13.12); Dysphagia, pharyngoesophageal phase (R13.14) Past Medical History: Past Medical History: Diagnosis Date  Anxiety   Breast cancer 1999  left lumpectomy/radiation/ductal carcinoma in situ  Cancer   Colon polyp   Contact lens/glasses fitting   HAS LENS IMPLANTS  Lung cancer 2015  LYMPHNODE REMOVAL   Medullary carcinoma of thyroid 2011  Osteoporosis  Past Surgical History: Past Surgical History: Procedure Laterality Date  ABDOMINAL HYSTERECTOMY    ABDOMINAL SURGERY  1989  RUPTURED APPENDIX  BREAST SURGERY  1999  LEFT BREAST LUMPECTOMY. DUCTAL CARCINOMA IN SITU./ RADIATION  CATARACT EXTRACTION W/ INTRAOCULAR LENS IMPLANT  2009  BOTH EYES  FEMUR IM NAIL Left 10/23/2017  Procedure: INTRAMEDULLARY (IM) NAIL FEMORAL;  Surgeon: Durene Romans, MD;  Location: [facility] OR;  Service: Orthopedics;  Laterality: Left;  FOOT SURGERY  1070  RIGHT FOOT - PINCHED NERVE  IR GASTROSTOMY TUBE MOD SED  01/13/2021  IR REPLACE G-TUBE SIMPLE WO FLUORO  03/08/2022  IR REPLACE G-TUBE SIMPLE WO FLUORO  11/01/2022  IR REPLC DUODEN/JEJUNO TUBE PERCUT W/FLUORO  02/13/2023  IR REPLC GASTRO/COLONIC TUBE PERCUT W/FLUORO  05/17/2022  LUNG SURGERY    CALCIFICATIONS, POSITIVE - CANCER LYMPHNODE   MELANOMA EXCISION  2008  CHEST AREA  NECK SURGERY  Nov 09 2011  LYMPHNODES REMOVED 2 POSITIVE   RIGHT COLECTOMY  06/02/2009  THYROIDECTOMY  11/06/2009  TONSILLECTOMY AND ADENOIDECTOMY   Rolena Infante, MS [facility] SLP Acute Rehab Services Office  [phone] Chales Abrahams 02/17/2023, 2:40 PM  DG Chest Port 1 View  Result Date: 02/14/2023 CLINICAL DATA:  09811 Respiratory failure [id number] Chest pain [id number] EXAM: PORTABLE CHEST 1 VIEW COMPARISON:  Radiograph 02/11/2023 FINDINGS: Unchanged cardiomediastinal silhouette. There is increased, mid to lower lung predominant airspace disease bilaterally, right greater than left. Small pleural effusions, right greater than left. No evidence of pneumothorax. Unchanged surgical clips overlying the lower neck. Bones are unchanged. IMPRESSION: Increased bilateral airspace disease, right greater left, with small pleural effusions. Electronically Signed   By: Caprice Renshaw M.D.   On: 02/14/2023 08:08   IR Replc Duoden/Jejuno Tube Percut W/Fluoro  Result Date: 02/13/2023 INDICATION: Long-term indwelling gastrostomy catheter, aspiration pneumonia, reflux, conversion into a post pyloric catheter requested EXAM: CONVERT GASTROSTOMY TO GASTROJEJUNOSTOMY CATHETER UNDER FLUOROSCOPY MEDICATIONS: no periprocedural antibiotics were indicated ANESTHESIA/SEDATION: Viscous lidocaine topical CONTRAST:  10 mL Omnipaque 300-administered into the gastric lumen. FLUOROSCOPY: Radiation Exposure Index (as provided by the fluoroscopic device): 10 mGy Kerma COMPLICATIONS: None immediate. PROCEDURE: Informed written consent was obtained from the patient after a thorough discussion of the procedural risks, benefits and alternatives. All questions were addressed. Maximal Sterile Barrier Technique was utilized including caps, mask, sterile gowns, sterile gloves, sterile drape, hand hygiene and skin antiseptic. A timeout was performed prior to the initiation of the procedure. Small contrast injection through the previously placed gastrostomy catheter opacified the decompressed lumen of the stomach. Stiff angled Glidewire was advanced to the pylorus. Gastrostomy tube was exchanged for a 5 French angled angiographic catheter, advanced  to ligament of Treitz. Catheter was exchanged over the guidewire for a 18 French single-lumen gastrojejunostomy catheter, positioned with its tip at the ligament of Treitz. Contrast injection confirms good positioning and patency. The retention balloon was inflated with 10 mL sterile saline within the gastric lumen. The patient tolerated the procedure well. IMPRESSION: Technically successful conversion of gastrostomy to single-lumen gastrojejunostomy catheter, okay for  routine post pyloric use. Electronically Signed   By: Corlis Leak M.D.   On: 02/13/2023 14:19   CT Angio Chest Pulmonary Embolism (PE) W or WO Contrast  Result Date: 02/12/2023 CLINICAL DATA:  Acute hypoxic respiratory failure. Multifocal pneumonia. Bowel obstruction suspected. EXAM: CT ANGIOGRAPHY CHEST CT ABDOMEN AND PELVIS WITH CONTRAST TECHNIQUE: Multidetector CT imaging of the chest was performed using the standard protocol during bolus administration of intravenous contrast. Multiplanar CT image reconstructions and MIPs were obtained to evaluate the vascular anatomy. Multidetector CT imaging of the abdomen and pelvis was performed using the standard protocol during bolus administration of intravenous contrast. RADIATION DOSE REDUCTION: This exam was performed according to the departmental dose-optimization program which includes automated exposure control, adjustment of the mA and/or kV according to patient size and/or use of iterative reconstruction technique. CONTRAST:  OMNIPAQUE IOHEXOL 350 MG/ML SOLN COMPARISON:  Chest abdomen and pelvis CT dated 06/18/2019. Chest CT dated 06/22/2020. CT thoracic and lumbar spine dated 01/16/2023. PET-CT dated 04/06/2022. FINDINGS: Cardiovascular: There is no pulmonary embolism identified within the main, lobar or segmental pulmonary arteries of either lung. No thoracic aortic aneurysm or evidence of aortic dissection. No clinically significant pericardial effusion. Mediastinum/Nodes: No mass or  enlarged lymph nodes are seen within the mediastinum or perihilar regions. Esophagus appears to be filled with fluid/debris to the level of the mid trachea. Trachea and central bronchi are unremarkable. Lungs/Pleura: Extensive biapical pleural-parenchymal scarring with associated architectural distortion, not significantly changed compared to the chest CT of 06/22/2020. Bibasilar consolidations, RIGHT greater than LEFT. Smaller patchy consolidations within the lingula and RIGHT middle lobe. Focal ground-glass opacity within the anterior aspect of the RIGHT upper lobe. Small bilateral pleural effusions. Musculoskeletal: Widespread osseous metastases, as previously described. Review of the MIP images confirms the above findings. CT ABDOMEN and PELVIS FINDINGS Hepatobiliary: Stable hypodense lesions within the LEFT liver lobe, consistent with benign cysts. New small hypodense lesion within the upper aspect of the RIGHT liver lobe (series 12, image 16), cyst versus early metastasis. Gallbladder appears normal. No significant bile duct dilatation seen. Pancreas: Unremarkable. No pancreatic ductal dilatation or surrounding inflammatory changes. Spleen: Normal in size without focal abnormality. Adrenals/Urinary Tract: Adrenal glands are unremarkable. Kidneys are unremarkable without suspicious mass, stone or hydronephrosis. No ureteral or bladder calculi identified. Bladder decompressed by Foley catheter. Stomach/Bowel: No dilated large or small bowel loops. Gastrostomy tube appears appropriately positioned within the body of the stomach. Vascular/Lymphatic: No abdominal aortic aneurysm. No acute-appearing vascular abnormality. No enlarged lymph nodes are identified in the abdomen or pelvis. Reproductive: No adnexal mass or free fluid seen. Other: No free fluid or abscess collection. No free intraperitoneal air. Musculoskeletal: Widespread osseous metastases, as previously described. Review of the MIP images confirms the  above findings. IMPRESSION: 1. Bibasilar consolidations, RIGHT greater than LEFT, compatible with multifocal pneumonia and/or aspiration. Favor aspiration as the esophagus is filled with fluid/debris to the level of the upper mediastinum predisposing the patient to potential aspirations. 2. Small bilateral pleural effusions. 3. Widespread osseous metastases, as previously described. 4. New small hypodense lesion within the upper aspect of the RIGHT liver lobe, additional cyst versus early metastasis (2 benign cysts within the LEFT liver lobe). Recommend nonemergent liver MRI for further characterization if clinically needed. 5. No bowel obstruction. No evidence of acute solid organ abnormality within the abdomen or pelvis. No renal or ureteral calculi. 6. Gastrostomy tube appears appropriately positioned within the body of the stomach. Electronically Signed   By: Weyman Croon  Linde Gillis M.D.   On: 02/12/2023 13:25   CT ABDOMEN PELVIS W CONTRAST  Result Date: 02/12/2023 CLINICAL DATA:  Acute hypoxic respiratory failure. Multifocal pneumonia. Bowel obstruction suspected. EXAM: CT ANGIOGRAPHY CHEST CT ABDOMEN AND PELVIS WITH CONTRAST TECHNIQUE: Multidetector CT imaging of the chest was performed using the standard protocol during bolus administration of intravenous contrast. Multiplanar CT image reconstructions and MIPs were obtained to evaluate the vascular anatomy. Multidetector CT imaging of the abdomen and pelvis was performed using the standard protocol during bolus administration of intravenous contrast. RADIATION DOSE REDUCTION: This exam was performed according to the departmental dose-optimization program which includes automated exposure control, adjustment of the mA and/or kV according to patient size and/or use of iterative reconstruction technique. CONTRAST:  OMNIPAQUE IOHEXOL 350 MG/ML SOLN COMPARISON:  Chest abdomen and pelvis CT dated 06/18/2019. Chest CT dated 06/22/2020. CT thoracic and lumbar spine  dated 01/16/2023. PET-CT dated 04/06/2022. FINDINGS: Cardiovascular: There is no pulmonary embolism identified within the main, lobar or segmental pulmonary arteries of either lung. No thoracic aortic aneurysm or evidence of aortic dissection. No clinically significant pericardial effusion. Mediastinum/Nodes: No mass or enlarged lymph nodes are seen within the mediastinum or perihilar regions. Esophagus appears to be filled with fluid/debris to the level of the mid trachea. Trachea and central bronchi are unremarkable. Lungs/Pleura: Extensive biapical pleural-parenchymal scarring with associated architectural distortion, not significantly changed compared to the chest CT of 06/22/2020. Bibasilar consolidations, RIGHT greater than LEFT. Smaller patchy consolidations within the lingula and RIGHT middle lobe. Focal ground-glass opacity within the anterior aspect of the RIGHT upper lobe. Small bilateral pleural effusions. Musculoskeletal: Widespread osseous metastases, as previously described. Review of the MIP images confirms the above findings. CT ABDOMEN and PELVIS FINDINGS Hepatobiliary: Stable hypodense lesions within the LEFT liver lobe, consistent with benign cysts. New small hypodense lesion within the upper aspect of the RIGHT liver lobe (series 12, image 16), cyst versus early metastasis. Gallbladder appears normal. No significant bile duct dilatation seen. Pancreas: Unremarkable. No pancreatic ductal dilatation or surrounding inflammatory changes. Spleen: Normal in size without focal abnormality. Adrenals/Urinary Tract: Adrenal glands are unremarkable. Kidneys are unremarkable without suspicious mass, stone or hydronephrosis. No ureteral or bladder calculi identified. Bladder decompressed by Foley catheter. Stomach/Bowel: No dilated large or small bowel loops. Gastrostomy tube appears appropriately positioned within the body of the stomach. Vascular/Lymphatic: No abdominal aortic aneurysm. No acute-appearing  vascular abnormality. No enlarged lymph nodes are identified in the abdomen or pelvis. Reproductive: No adnexal mass or free fluid seen. Other: No free fluid or abscess collection. No free intraperitoneal air. Musculoskeletal: Widespread osseous metastases, as previously described. Review of the MIP images confirms the above findings. IMPRESSION: 1. Bibasilar consolidations, RIGHT greater than LEFT, compatible with multifocal pneumonia and/or aspiration. Favor aspiration as the esophagus is filled with fluid/debris to the level of the upper mediastinum predisposing the patient to potential aspirations. 2. Small bilateral pleural effusions. 3. Widespread osseous metastases, as previously described. 4. New small hypodense lesion within the upper aspect of the RIGHT liver lobe, additional cyst versus early metastasis (2 benign cysts within the LEFT liver lobe). Recommend nonemergent liver MRI for further characterization if clinically needed. 5. No bowel obstruction. No evidence of acute solid organ abnormality within the abdomen or pelvis. No renal or ureteral calculi. 6. Gastrostomy tube appears appropriately positioned within the body of the stomach. Electronically Signed   By: Bary Richard M.D.   On: 02/12/2023 13:25   ECHOCARDIOGRAM COMPLETE  Result Date: 02/12/2023  ECHOCARDIOGRAM REPORT   Patient Name:   Shannon [name] Date of Exam: 02/12/2023 Medical Rec #:  [id number]     Height:       64.0 in Accession #:    [id number]    Weight:       84.2 lb Date of Birth:  11/19/1940      BSA:          1.355 m Patient Age:    82 years      BP:           136/72 mmHg Patient Gender: F             HR:           96 bpm. Exam Location:  Inpatient Procedure: 2D Echo, Cardiac Doppler and Color Doppler Indications:    A-fib  History:        Patient has no prior history of Echocardiogram examinations.  Sonographer:    Lucy Antigua Referring Phys: [id number] Ranee Gosselin IMPRESSIONS  1. Left ventricular ejection fraction, by  estimation, is 45 to 50%. The left ventricle has mildly decreased function. The left ventricle has no regional wall motion abnormalities. Left ventricular diastolic parameters are consistent with Grade I diastolic dysfunction (impaired relaxation).  2. Right ventricular systolic function is normal. The right ventricular size is normal. There is normal pulmonary artery systolic pressure. The estimated right ventricular systolic pressure is 23.8 mmHg.  3. Left atrial size was mildly dilated.  4. The mitral valve is normal in structure. Mild mitral valve regurgitation. No evidence of mitral stenosis.  5. The aortic valve is tricuspid. Aortic valve regurgitation is moderate. No aortic stenosis is present.  6. The inferior vena cava is normal in size with <50% respiratory variability, suggesting right atrial pressure of 8 mmHg. Comparison(s): No prior Echocardiogram. FINDINGS  Left Ventricle: Left ventricular ejection fraction, by estimation, is 45 to 50%. The left ventricle has mildly decreased function. The left ventricle has no regional wall motion abnormalities. The left ventricular internal cavity size was normal in size. There is no left ventricular hypertrophy. Left ventricular diastolic parameters are consistent with Grade I diastolic dysfunction (impaired relaxation). Right Ventricle: The right ventricular size is normal. No increase in right ventricular wall thickness. Right ventricular systolic function is normal. There is normal pulmonary artery systolic pressure. The tricuspid regurgitant velocity is 2.28 m/s, and  with an assumed right atrial pressure of 3 mmHg, the estimated right ventricular systolic pressure is 23.8 mmHg. Left Atrium: Left atrial size was mildly dilated. Right Atrium: Right atrial size was normal in size. Pericardium: There is no evidence of pericardial effusion. Mitral Valve: The mitral valve is normal in structure. Mild mitral annular calcification. Mild mitral valve regurgitation. No  evidence of mitral valve stenosis. Tricuspid Valve: The tricuspid valve is normal in structure. Tricuspid valve regurgitation is trivial. No evidence of tricuspid stenosis. Aortic Valve: The aortic valve is tricuspid. Aortic valve regurgitation is moderate. No aortic stenosis is present. Aortic valve mean gradient measures 5.0 mmHg. Aortic valve peak gradient measures 6.9 mmHg. Aortic valve area, by VTI measures 1.61 cm. Pulmonic Valve: The pulmonic valve was not well visualized. Pulmonic valve regurgitation is not visualized. No evidence of pulmonic stenosis. Aorta: The aortic root and ascending aorta are structurally normal, with no evidence of dilitation. Venous: The inferior vena cava is normal in size with less than 50% respiratory variability, suggesting right atrial pressure of 8 mmHg. IAS/Shunts: No atrial level shunt detected by color  flow Doppler.  LEFT VENTRICLE PLAX 2D LVIDd:         5.00 cm     Diastology LVIDs:         4.00 cm     LV e' medial:    5.22 cm/s LV PW:         0.90 cm     LV E/e' medial:  14.4 LV IVS:        0.80 cm     LV e' lateral:   8.81 cm/s LVOT diam:     1.70 cm     LV E/e' lateral: 8.6 LV SV:         44 LV SV Index:   32 LVOT Area:     2.27 cm  LV Volumes (MOD) LV vol d, MOD A4C: 82.7 ml LV vol s, MOD A4C: 37.9 ml LV SV MOD A4C:     82.7 ml RIGHT VENTRICLE             IVC RV S prime:     14.40 cm/s  IVC diam: 1.90 cm TAPSE (M-mode): 2.4 cm LEFT ATRIUM             Index        RIGHT ATRIUM           Index LA Vol (A2C):   35.1 ml 25.91 ml/m  RA Area:     12.40 cm LA Vol (A4C):   50.3 ml 37.13 ml/m  RA Volume:   28.60 ml  21.11 ml/m LA Biplane Vol: 45.3 ml 33.44 ml/m  AORTIC VALVE AV Area (Vmax):    1.75 cm AV Area (Vmean):   1.48 cm AV Area (VTI):     1.61 cm AV Vmax:           131.00 cm/s AV Vmean:          104.000 cm/s AV VTI:            0.271 m AV Peak Grad:      6.9 mmHg AV Mean Grad:      5.0 mmHg LVOT Vmax:         101.00 cm/s LVOT Vmean:        67.700 cm/s LVOT VTI:           0.192 m LVOT/AV VTI ratio: 0.71  AORTA Ao Root diam: 2.90 cm Ao Asc diam:  2.90 cm MITRAL VALVE               TRICUSPID VALVE MV Area (PHT): 3.72 cm    TR Peak grad:   20.8 mmHg MV Decel Time: 204 msec    TR Vmax:        228.00 cm/s MR Peak grad: 94.5 mmHg MR Vmax:      486.00 cm/s  SHUNTS MV E velocity: 75.40 cm/s  Systemic VTI:  0.19 m MV A velocity: 96.40 cm/s  Systemic Diam: 1.70 cm MV E/A ratio:  0.78 Vishnu Priya Mallipeddi Electronically signed by Winfield Rast Mallipeddi Signature Date/Time: 02/12/2023/12:08:49 PM    Final    DG ABDOMEN PEG TUBE LOCATION  Result Date: 02/11/2023 CLINICAL DATA:  PEG tube placement EXAM: ABDOMEN - 1 VIEW COMPARISON:  11/01/2022 FINDINGS: Contrast administered via indwelling gastrostomy tube opacifies the stomach, confirming intragastric placement. Proximal small bowel is also opacified. No extraluminal contrast is visualized. IMPRESSION: Contrast administered via indwelling gastrostomy tube opacifies the stomach, confirming intragastric placement. Electronically Signed   By: Charline Bills M.D.   On: 02/11/2023  20:53   US THORACENTESIS ASP PLEURAL SPACE W/IMG GUIDE  Result Date: 02/11/2023 INDICATION: Shortness of breath. Right-sided pleural effusion. Request for thoracentesis. EXAM: ULTRASOUND GUIDED RIGHT THORACENTESIS MEDICATIONS: 1% plain lidocaine, 4 mL COMPLICATIONS: None immediate. PROCEDURE: An ultrasound guided thoracentesis was thoroughly discussed with the patient and questions answered. The benefits, risks, alternatives and complications were also discussed. The patient understands and wishes to proceed with the procedure. Written consent was obtained. Ultrasound was performed to localize and mark an adequate pocket of fluid in the right chest. The area was then prepped and draped in the normal sterile fashion. 1% Lidocaine was used for local anesthesia. Under ultrasound guidance a 6 Fr Safe-T-Centesis catheter was introduced. Thoracentesis was  performed. The catheter was removed and a dressing applied. FINDINGS: A total of approximately 500 mL of slightly hazy yellow fluid was removed. Samples were obtained for labs if requested by the clinical team. IMPRESSION: Successful ultrasound guided right thoracentesis yielding 500 mL of pleural fluid. Read by: Brayton El PA-C Electronically Signed   By: Gilmer Mor D.O.   On: 02/11/2023 11:36   DG Chest 1 View  Result Date: 02/11/2023 CLINICAL DATA:  Thoracentesis today EXAM: CHEST  1 VIEW COMPARISON:  Yesterday FINDINGS: Lucency at the right base could be reaerated lung or small loculated pneumothorax. No apical pneumothorax. Bilateral airspace disease and pleural thickening at the apices. IMPRESSION: 1. Improved aeration on the right. Lucency at the lateral right costophrenic sulcus could be aerated lung or tiny loculated pneumothorax. No large or apical pneumothorax. 2. Bilateral airspace disease. Electronically Signed   By: Tiburcio Pea M.D.   On: 02/11/2023 09:58   DG Chest Port 1 View  Result Date: 02/10/2023 CLINICAL DATA:  Shortness of breath EXAM: PORTABLE CHEST 1 VIEW COMPARISON:  Radiograph 06/17/2022 FINDINGS: Unchanged cardiomediastinal silhouette. Mild interstitial opacities. There is a small to moderate size right pleural effusion with adjacent basilar opacities. Additional patchy left basilar and left midlung opacities. Unchanged right apical calcification and associated lung opacity. No evidence of pneumothorax. Diffuse osseous metastatic lesions, best visualized on prior CTs. IMPRESSION: Small to moderate size right pleural effusion. Adjacent right lower lung opacities and additional patchy left basilar and left mid lung opacities, could reflect atelectasis and/or pneumonia. Interstitial prominence could reflect a degree of pulmonary edema. Electronically Signed   By: Caprice Renshaw M.D.   On: 02/10/2023 13:59     Subjective:  No acute issues/events overnight   Discharge  Exam: Vitals:   02/20/23 0738 02/20/23 1119  BP: (!) 132/59 (!) 113/58  Pulse: (!) 111 (!) 101  Resp: 19 20  Temp: 98 F (36.7 C) 98.4 F (36.9 C)  SpO2: 92% 94%   Vitals:   02/20/23 0144 02/20/23 0455 02/20/23 0738 02/20/23 1119  BP: (!) 125/58 (!) 123/54 (!) 132/59 (!) 113/58  Pulse: (!) 110 (!) 112 (!) 111 (!) 101  Resp: 17 17 19 20   Temp: 98.7 F (37.1 C) 98.2 F (36.8 C) 98 F (36.7 C) 98.4 F (36.9 C)  TempSrc: Oral Oral  Oral  SpO2: 92% 92% 92% 94%  Weight:      Height:        General: Pt is alert, awake, not in acute distress Cardiovascular: RRR, S1/S2 +, no rubs, no gallops Respiratory: CTA bilaterally, no wheezing, no rhonchi Abdominal: Soft, NT, ND, bowel sounds + Extremities: no edema, no cyanosis    The results of significant diagnostics from this hospitalization (including imaging, microbiology, ancillary and laboratory) are listed  below for reference.     Microbiology: Recent Results (from the past 240 hour(s))  SARS Coronavirus 2 by RT PCR (hospital order, performed in [facility] hospital lab) *cepheid single result test* Anterior Nasal Swab     Status: None   Collection Time: 02/10/23  1:17 PM   Specimen: Anterior Nasal Swab  Result Value Ref Range Status   SARS Coronavirus 2 by RT PCR NEGATIVE NEGATIVE Final    Comment: (NOTE) SARS-CoV-2 target nucleic acids are NOT DETECTED.  The SARS-CoV-2 RNA is generally detectable in upper and lower respiratory specimens during the acute phase of infection. The lowest concentration of SARS-CoV-2 viral copies this assay can detect is 250 copies / mL. A negative result does not preclude SARS-CoV-2 infection and should not be used as the sole basis for treatment or other patient management decisions.  A negative result may occur with improper specimen collection / handling, submission of specimen other than nasopharyngeal swab, presence of viral mutation(s) within the areas targeted by this assay, and  inadequate number of viral copies (<250 copies / mL). A negative result must be combined with clinical observations, patient history, and epidemiological information.  Fact Sheet for Patients:   RoadLapTop.co.za  Fact Sheet for Healthcare Providers: http://kim-miller.com/  This test is not yet approved or  cleared by the [place] FDA and has been authorized for detection and/or diagnosis of SARS-CoV-2 by FDA under an Emergency Use Authorization (EUA).  This EUA will remain in effect (meaning this test can be used) for the duration of the COVID-19 declaration under Section 564(b)(1) of the Act, 21 U.S.C. section 360bbb-3(b)(1), unless the authorization is terminated or revoked sooner.  Performed at [facility], [address], [place], [place] [address]   Blood Culture (routine x 2)     Status: None   Collection Time: 02/10/23  1:17 PM   Specimen: BLOOD  Result Value Ref Range Status   Specimen Description   Final    BLOOD RIGHT ANTECUBITAL Performed at [facility], [address], [place], [place] [address]    Special Requests   Final    BOTTLES DRAWN AEROBIC AND ANAEROBIC Blood Culture adequate volume Performed at [facility], [address] [place], [place] [address]    Culture   Final    NO GROWTH 5 DAYS Performed at [facility], [address], [place], [place] [address]    Report Status 02/15/2023 FINAL  Final  Blood Culture (routine x 2)     Status: None   Collection Time: 02/10/23  1:22 PM   Specimen: BLOOD  Result Value Ref Range Status   Specimen Description   Final    BLOOD LEFT ANTECUBITAL Performed at [facility], [address] [place], [place] [address]    Special Requests   Final    BOTTLES DRAWN AEROBIC AND ANAEROBIC Blood Culture adequate volume Performed at [facility], [address]  [place], [place] [address]    Culture   Final    NO GROWTH 5 DAYS Performed at [facility], [address], [place], [place] [address]    Report Status 02/15/2023 FINAL  Final  Body fluid culture w Gram Stain     Status: None   Collection Time: 02/11/23 12:50 PM   Specimen: PATH Cytology Pleural fluid  Result Value Ref Range Status   Specimen Description PLEURAL FLUID RIGHT  Final   Special Requests SYRINGE  Final   Gram Stain NO ORGANISMS SEEN  Final   Culture   Final    NO GROWTH 3 DAYS Performed at [facility], [address], [place], [place] [address]    Report Status 02/15/2023 FINAL  Final  MRSA Next Gen by PCR, Nasal     Status: None   Collection Time: 02/11/23  6:13 PM   Specimen: Nasal Mucosa; Nasal Swab  Result Value Ref Range Status   MRSA by PCR Next Gen NOT DETECTED NOT DETECTED Final    Comment: (NOTE) The GeneXpert MRSA Assay (FDA approved for NASAL specimens only), is one component of a comprehensive MRSA colonization surveillance program. It is not intended to diagnose MRSA infection nor to guide or monitor treatment for MRSA infections. Test performance is not FDA approved in patients less than 16 years old. Performed at [facility], [address], [place], [place] [address]   Respiratory (~20 pathogens) panel by PCR     Status: None   Collection Time: 02/11/23  6:14 PM   Specimen: Nasopharyngeal Swab; Respiratory  Result Value Ref Range Status   Adenovirus NOT DETECTED NOT DETECTED Final   Coronavirus 229E NOT DETECTED NOT DETECTED Final    Comment: (NOTE) The Coronavirus on the Respiratory Panel, DOES NOT test for the novel  Coronavirus (2019 nCoV)    Coronavirus HKU1 NOT DETECTED NOT DETECTED Final   Coronavirus NL63 NOT DETECTED NOT DETECTED Final   Coronavirus OC43 NOT DETECTED NOT DETECTED Final   Metapneumovirus NOT DETECTED NOT DETECTED Final   Rhinovirus / Enterovirus NOT DETECTED NOT DETECTED Final   Influenza A  NOT DETECTED NOT DETECTED Final   Influenza B NOT DETECTED NOT DETECTED Final   Parainfluenza Virus 1 NOT DETECTED NOT DETECTED Final   Parainfluenza Virus 2 NOT DETECTED NOT DETECTED Final   Parainfluenza Virus 3 NOT DETECTED NOT DETECTED Final   Parainfluenza Virus 4 NOT DETECTED NOT DETECTED Final   Respiratory Syncytial Virus NOT DETECTED NOT DETECTED Final   Bordetella pertussis NOT DETECTED NOT DETECTED Final   Bordetella Parapertussis NOT DETECTED NOT DETECTED Final   Chlamydophila pneumoniae NOT DETECTED NOT DETECTED Final   Mycoplasma pneumoniae NOT DETECTED NOT DETECTED Final    Comment: Performed at [facility], [address] [place], [place] [address]     Labs: BNP (last 3 results) Recent Labs    02/10/23 1317  BNP 205.1*   Basic Metabolic Panel: Recent Labs  Lab 02/14/23 0709 02/15/23 0316 02/16/23 0824 02/17/23 0731 02/19/23 0542  NA 138 140 140  --  140  K 3.0* 3.7 3.9  --  4.6  CL 100 107 108  --  103  CO2 24 25 25   --  30  GLUCOSE 129* 141* 178*  --  112*  BUN 12 14 18   --  27*  CREATININE 0.60 0.70 0.64 0.71 0.68  CALCIUM 8.0* 8.4* 8.1*  --  8.5*  MG 1.9  --   --   --   --    CBC: Recent Labs  Lab 02/14/23 0709 02/15/23 0316 02/16/23 0824 02/19/23 0542  WBC 13.3* 13.5* 12.8* 9.4  NEUTROABS 11.6*  --   --   --   HGB 13.7 13.6 12.8 13.0  HCT 43.2 45.4 42.1 43.0  MCV 82.4 85.5 84.9 84.1  PLT 347 309 357 406*   CBG: Recent Labs  Lab 02/19/23 2046 02/20/23 0012 02/20/23 0451 02/20/23 0750 02/20/23 1121  GLUCAP 134* 127*  124* 121* 150*   Urinalysis    Component Value Date/Time   COLORURINE YELLOW 11/05/2009 0924   APPEARANCEUR CLEAR 11/05/2009 0924   LABSPEC 1.017 11/05/2009 0924   PHURINE 6.0 11/05/2009 0924   GLUCOSEU NEGATIVE 11/05/2009 0924   HGBUR NEGATIVE 11/05/2009 0924   BILIRUBINUR NEGATIVE 11/05/2009 0924   KETONESUR NEGATIVE 11/05/2009 0924   PROTEINUR NEGATIVE 11/05/2009 0924   UROBILINOGEN 0.2 11/05/2009 0924    NITRITE NEGATIVE 11/05/2009 0924   LEUKOCYTESUR TRACE (A) 11/05/2009 0924   Sepsis Labs Recent Labs  Lab 02/14/23 0709 02/15/23 0316 02/16/23 0824 02/19/23 0542  WBC 13.3* 13.5* 12.8* 9.4   Microbiology Recent Results (from the past 240 hour(s))  SARS Coronavirus 2 by RT PCR (hospital order, performed in [facility] hospital lab) *cepheid single result test* Anterior Nasal Swab     Status: None   Collection Time: 02/10/23  1:17 PM   Specimen: Anterior Nasal Swab  Result Value Ref Range Status   SARS Coronavirus 2 by RT PCR NEGATIVE NEGATIVE Final    Comment: (NOTE) SARS-CoV-2 target nucleic acids are NOT DETECTED.  The SARS-CoV-2 RNA is generally detectable in upper and lower respiratory specimens during the acute phase of infection. The lowest concentration of SARS-CoV-2 viral copies this assay can detect is 250 copies / mL. A negative result does not preclude SARS-CoV-2 infection and should not be used as the sole basis for treatment or other patient management decisions.  A negative result may occur with improper specimen collection / handling, submission of specimen other than nasopharyngeal swab, presence of viral mutation(s) within the areas targeted by this assay, and inadequate number of viral copies (<250 copies / mL). A negative result must be combined with clinical observations, patient history, and epidemiological information.  Fact Sheet for Patients:   RoadLapTop.co.za  Fact Sheet for Healthcare Providers: http://kim-miller.com/  This test is not yet approved or  cleared by the [place] FDA and has been authorized for detection and/or diagnosis of SARS-CoV-2 by FDA under an Emergency Use Authorization (EUA).  This EUA will remain in effect (meaning this test can be used) for the duration of the COVID-19 declaration under Section 564(b)(1) of the Act, 21 U.S.C. section 360bbb-3(b)(1), unless the  authorization is terminated or revoked sooner.  Performed at [facility], [address] [place], [place] [address]   Blood Culture (routine x 2)     Status: None   Collection Time: 02/10/23  1:17 PM   Specimen: BLOOD  Result Value Ref Range Status   Specimen Description   Final    BLOOD RIGHT ANTECUBITAL Performed at [facility], [address], [place], [place] [address]    Special Requests   Final    BOTTLES DRAWN AEROBIC AND ANAEROBIC Blood Culture adequate volume Performed at [facility], [address] [place], [place] [address]    Culture   Final    NO GROWTH 5 DAYS Performed at [facility], [address] [place], [place] [address]    Report Status 02/15/2023 FINAL  Final  Blood Culture (routine x 2)     Status: None   Collection Time: 02/10/23  1:22 PM   Specimen: BLOOD  Result Value Ref Range Status   Specimen Description   Final    BLOOD LEFT ANTECUBITAL Performed at [facility], [address] [place], [place] [address]    Special Requests   Final    BOTTLES DRAWN AEROBIC AND  ANAEROBIC Blood Culture adequate volume Performed at [facility], [address] [place], [place] [address]    Culture   Final    NO GROWTH 5 DAYS Performed at [facility], [address], [place], [place] [address]    Report Status 02/15/2023 FINAL  Final  Body fluid culture w Gram Stain     Status: None   Collection Time: 02/11/23 12:50 PM   Specimen: PATH Cytology Pleural fluid  Result Value Ref Range Status   Specimen Description PLEURAL FLUID RIGHT  Final   Special Requests SYRINGE  Final   Gram Stain NO ORGANISMS SEEN  Final   Culture   Final    NO GROWTH 3 DAYS Performed at [facility], [address], [place], [place] [address]    Report Status 02/15/2023 FINAL  Final  MRSA Next Gen by PCR, Nasal     Status: None   Collection Time: 02/11/23  6:13 PM    Specimen: Nasal Mucosa; Nasal Swab  Result Value Ref Range Status   MRSA by PCR Next Gen NOT DETECTED NOT DETECTED Final    Comment: (NOTE) The GeneXpert MRSA Assay (FDA approved for NASAL specimens only), is one component of a comprehensive MRSA colonization surveillance program. It is not intended to diagnose MRSA infection nor to guide or monitor treatment for MRSA infections. Test performance is not FDA approved in patients less than 78 years old. Performed at [facility], [address] [place], [place] [address]   Respiratory (~20 pathogens) panel by PCR     Status: None   Collection Time: 02/11/23  6:14 PM   Specimen: Nasopharyngeal Swab; Respiratory  Result Value Ref Range Status   Adenovirus NOT DETECTED NOT DETECTED Final   Coronavirus 229E NOT DETECTED NOT DETECTED Final    Comment: (NOTE) The Coronavirus on the Respiratory Panel, DOES NOT test for the novel  Coronavirus (2019 nCoV)    Coronavirus HKU1 NOT DETECTED NOT DETECTED Final   Coronavirus NL63 NOT DETECTED NOT DETECTED Final   Coronavirus OC43 NOT DETECTED NOT DETECTED Final   Metapneumovirus NOT DETECTED NOT DETECTED Final   Rhinovirus / Enterovirus NOT DETECTED NOT DETECTED Final   Influenza A NOT DETECTED NOT DETECTED Final   Influenza B NOT DETECTED NOT DETECTED Final   Parainfluenza Virus 1 NOT DETECTED NOT DETECTED Final   Parainfluenza Virus 2 NOT DETECTED NOT DETECTED Final   Parainfluenza Virus 3 NOT DETECTED NOT DETECTED Final   Parainfluenza Virus 4 NOT DETECTED NOT DETECTED Final   Respiratory Syncytial Virus NOT DETECTED NOT DETECTED Final   Bordetella pertussis NOT DETECTED NOT DETECTED Final   Bordetella Parapertussis NOT DETECTED NOT DETECTED Final   Chlamydophila pneumoniae NOT DETECTED NOT DETECTED Final   Mycoplasma pneumoniae NOT DETECTED NOT DETECTED Final    Comment: Performed at [facility], [address] [place], [place] [address]     Time  coordinating discharge: Over 30 minutes  SIGNED:   Azucena Fallen, DO [facility] 02/20/2023, 12:26 PM Pager   If 7PM-7AM, please contact night-coverage [email or web address]

## 2023-03-07 ENCOUNTER — Ambulatory Visit: Payer: Medicare Other | Admitting: Nutrition

## 2023-03-07 NOTE — Progress Notes (Signed)
Received call from [facility] requesting help with TF orders.  Patient recently discharged from [facility] on continuous TF. Patient tried Molli Posey 1.5 in the hospital and Isosource in the SNF and did not tolerate either. Reports diarrhea got worse on both fiber containing formulas. Patient wants to resume Osmolite 1.5.   Bethany states the DME did not deliver a pump for feedings. (Patient has a j-tube and needs continuous feedings.)  Advised to call [facility] and speak to RN or case manager who can provide the DME name. She then will need to contact the DME and let them know she did not receive the pump. Dr. Marca Ancona wrote the TF orders for Osmolite 1.5 prior to hospitalization so will need to write new orders discontinuing current TF.

## 2023-03-09 ENCOUNTER — Ambulatory Visit: Payer: Medicare Other | Admitting: Nutrition

## 2023-03-09 NOTE — Progress Notes (Signed)
I received a phone call from Dr. Laurey Morale office with need for tube feeding clarification/new orders.  Patient has been discharged from skilled nursing facility and is now home after J-tube placement.  Patient wishes to change tube feedings back to Osmolite 1.5 which she has tolerated well in the past.  Patient trialed Molli Posey 1.4 and Isosource 1.5 and reported increased diarrhea.  Recommend running Osmolite 1.5 via J-tube at 120 mL/h for 14 hours from 6 PM to 8 AM.  Flush feeding tube with 200 mL free water 6 times a day.  Tube feeding and free water flushes provides 2485 cal, 104 g protein and 2467 mL free water/greater than 100% estimated nutrition needs to promote weight gain.  I have been in communication with [facility] regarding above orders.  I sent Dr. Marca Ancona an in basket message with the above information so tube feeding orders could be placed and faxed to home health agency.  I have left contact information for questions.

## 2023-03-13 ENCOUNTER — Other Ambulatory Visit: Payer: Self-pay | Admitting: Nutrition

## 2023-03-13 MED ORDER — OSMOLITE 1.5 CAL PO LIQD: ORAL | 12 refills | Status: DC

## 2023-03-17 ENCOUNTER — Encounter: Payer: Self-pay | Admitting: Nutrition

## 2023-03-17 NOTE — Progress Notes (Signed)
Patient sees Dr. 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 PET from 03/2022 per MD note no progressive disease. She is not currently undergoing treatment for her cancer. G-tube placed January 13, 2021. During recent hospitalization, patient had G-tube converted to J-tube. RD at [facility] can be contacted for questions about TF.  Home Health agency: [facility]  MD ordering/managing TF: Dr. Marca Ancona with [facility]  Enteral Access: J-tube  Formula: Osmolite 1.5 via j-tube at 95 mL/hr for 18 hours daily. Free water flush of 200 mL 6 times daily.  Provides 2485 kcal, 104 gm protein, 2467 mL free water  RD at [facility]: Montine Circle, RD

## 2023-04-13 ENCOUNTER — Other Ambulatory Visit (HOSPITAL_COMMUNITY): Payer: Self-pay

## 2023-04-14 ENCOUNTER — Other Ambulatory Visit: Payer: Self-pay | Admitting: Internal Medicine

## 2023-04-21 ENCOUNTER — Ambulatory Visit (HOSPITAL_COMMUNITY): Payer: Medicare Other

## 2023-04-21 ENCOUNTER — Ambulatory Visit (HOSPITAL_COMMUNITY)
Admission: RE | Admit: 2023-04-21 | Discharge: 2023-04-21 | Disposition: A | Discharge status: Home / Self Care | Payer: Medicare Other | Source: Ambulatory Visit | Attending: Hematology | Admitting: Hematology

## 2023-04-21 DIAGNOSIS — I2510 Atherosclerotic heart disease of native coronary artery without angina pectoris: Secondary | ICD-10-CM | POA: Insufficient documentation

## 2023-04-21 DIAGNOSIS — R911 Solitary pulmonary nodule: Secondary | ICD-10-CM | POA: Insufficient documentation

## 2023-04-21 DIAGNOSIS — I700 Atherosclerosis of aorta: Secondary | ICD-10-CM | POA: Diagnosis not present

## 2023-04-21 DIAGNOSIS — C7951 Secondary malignant neoplasm of bone: Secondary | ICD-10-CM | POA: Diagnosis not present

## 2023-04-21 DIAGNOSIS — C73 Malignant neoplasm of thyroid gland: Secondary | ICD-10-CM

## 2023-04-21 DIAGNOSIS — J90 Pleural effusion, not elsewhere classified: Secondary | ICD-10-CM | POA: Insufficient documentation

## 2023-04-21 MED ORDER — SODIUM CHLORIDE (PF) 0.9 % IJ SOLN
INTRAMUSCULAR | Status: AC
  Filled 2023-04-21: qty 50

## 2023-04-21 MED ORDER — IOHEXOL 300 MG/ML  SOLN
100.0000 mL | Freq: Once | INTRAMUSCULAR | Status: AC | PRN
  Administered 2023-04-21: 100 mL via INTRAVENOUS

## 2023-05-05 ENCOUNTER — Other Ambulatory Visit: Payer: Self-pay | Admitting: Hematology

## 2023-05-05 DIAGNOSIS — C73 Malignant neoplasm of thyroid gland: Secondary | ICD-10-CM

## 2023-05-08 ENCOUNTER — Encounter: Payer: Self-pay | Admitting: Hematology

## 2023-05-09 ENCOUNTER — Encounter: Payer: Self-pay | Admitting: Internal Medicine

## 2023-05-10 ENCOUNTER — Emergency Department (HOSPITAL_COMMUNITY): Payer: Medicare Other

## 2023-05-10 ENCOUNTER — Inpatient Hospital Stay (HOSPITAL_COMMUNITY): Payer: Medicare Other

## 2023-05-10 ENCOUNTER — Inpatient Hospital Stay (HOSPITAL_COMMUNITY)
Admission: EM | Admit: 2023-05-10 | Discharge: 2023-06-01 | DRG: 871 | Disposition: E | Payer: Medicare Other | Attending: Pulmonary Disease | Admitting: Pulmonary Disease

## 2023-05-10 ENCOUNTER — Other Ambulatory Visit: Payer: Self-pay

## 2023-05-10 ENCOUNTER — Encounter (HOSPITAL_COMMUNITY): Payer: Medicare Other

## 2023-05-10 ENCOUNTER — Encounter (HOSPITAL_COMMUNITY): Payer: Self-pay

## 2023-05-10 DIAGNOSIS — I480 Paroxysmal atrial fibrillation: Secondary | ICD-10-CM | POA: Diagnosis present

## 2023-05-10 DIAGNOSIS — J690 Pneumonitis due to inhalation of food and vomit: Secondary | ICD-10-CM | POA: Diagnosis present

## 2023-05-10 DIAGNOSIS — G9340 Encephalopathy, unspecified: Secondary | ICD-10-CM | POA: Diagnosis not present

## 2023-05-10 DIAGNOSIS — T17908A Unspecified foreign body in respiratory tract, part unspecified causing other injury, initial encounter: Secondary | ICD-10-CM | POA: Diagnosis not present

## 2023-05-10 DIAGNOSIS — R54 Age-related physical debility: Secondary | ICD-10-CM | POA: Diagnosis present

## 2023-05-10 DIAGNOSIS — Z961 Presence of intraocular lens: Secondary | ICD-10-CM | POA: Diagnosis present

## 2023-05-10 DIAGNOSIS — Z85820 Personal history of malignant melanoma of skin: Secondary | ICD-10-CM

## 2023-05-10 DIAGNOSIS — F419 Anxiety disorder, unspecified: Secondary | ICD-10-CM | POA: Diagnosis present

## 2023-05-10 DIAGNOSIS — R404 Transient alteration of awareness: Secondary | ICD-10-CM | POA: Diagnosis present

## 2023-05-10 DIAGNOSIS — E890 Postprocedural hypothyroidism: Secondary | ICD-10-CM | POA: Diagnosis present

## 2023-05-10 DIAGNOSIS — M7989 Other specified soft tissue disorders: Secondary | ICD-10-CM | POA: Diagnosis not present

## 2023-05-10 DIAGNOSIS — Z66 Do not resuscitate: Secondary | ICD-10-CM | POA: Diagnosis present

## 2023-05-10 DIAGNOSIS — Z515 Encounter for palliative care: Secondary | ICD-10-CM

## 2023-05-10 DIAGNOSIS — Z9841 Cataract extraction status, right eye: Secondary | ICD-10-CM

## 2023-05-10 DIAGNOSIS — Z9842 Cataract extraction status, left eye: Secondary | ICD-10-CM

## 2023-05-10 DIAGNOSIS — Z885 Allergy status to narcotic agent status: Secondary | ICD-10-CM

## 2023-05-10 DIAGNOSIS — C7951 Secondary malignant neoplasm of bone: Secondary | ICD-10-CM | POA: Diagnosis present

## 2023-05-10 DIAGNOSIS — Z86010 Personal history of colonic polyps: Secondary | ICD-10-CM

## 2023-05-10 DIAGNOSIS — E43 Unspecified severe protein-calorie malnutrition: Secondary | ICD-10-CM | POA: Diagnosis present

## 2023-05-10 DIAGNOSIS — K529 Noninfective gastroenteritis and colitis, unspecified: Secondary | ICD-10-CM | POA: Diagnosis present

## 2023-05-10 DIAGNOSIS — R1312 Dysphagia, oropharyngeal phase: Secondary | ICD-10-CM | POA: Diagnosis present

## 2023-05-10 DIAGNOSIS — Z85850 Personal history of malignant neoplasm of thyroid: Secondary | ICD-10-CM | POA: Diagnosis not present

## 2023-05-10 DIAGNOSIS — Z90710 Acquired absence of both cervix and uterus: Secondary | ICD-10-CM

## 2023-05-10 DIAGNOSIS — Z6821 Body mass index (BMI) 21.0-21.9, adult: Secondary | ICD-10-CM

## 2023-05-10 DIAGNOSIS — R627 Adult failure to thrive: Secondary | ICD-10-CM | POA: Diagnosis present

## 2023-05-10 DIAGNOSIS — C7802 Secondary malignant neoplasm of left lung: Secondary | ICD-10-CM | POA: Diagnosis present

## 2023-05-10 DIAGNOSIS — A419 Sepsis, unspecified organism: Secondary | ICD-10-CM | POA: Diagnosis present

## 2023-05-10 DIAGNOSIS — Z79890 Hormone replacement therapy (postmenopausal): Secondary | ICD-10-CM

## 2023-05-10 DIAGNOSIS — J9601 Acute respiratory failure with hypoxia: Secondary | ICD-10-CM | POA: Diagnosis present

## 2023-05-10 DIAGNOSIS — R6521 Severe sepsis with septic shock: Secondary | ICD-10-CM | POA: Diagnosis present

## 2023-05-10 DIAGNOSIS — E875 Hyperkalemia: Secondary | ICD-10-CM | POA: Diagnosis present

## 2023-05-10 DIAGNOSIS — M810 Age-related osteoporosis without current pathological fracture: Secondary | ICD-10-CM | POA: Diagnosis present

## 2023-05-10 DIAGNOSIS — J90 Pleural effusion, not elsewhere classified: Secondary | ICD-10-CM | POA: Diagnosis present

## 2023-05-10 DIAGNOSIS — Z87891 Personal history of nicotine dependence: Secondary | ICD-10-CM

## 2023-05-10 DIAGNOSIS — G9341 Metabolic encephalopathy: Secondary | ICD-10-CM | POA: Diagnosis present

## 2023-05-10 DIAGNOSIS — Z931 Gastrostomy status: Secondary | ICD-10-CM

## 2023-05-10 DIAGNOSIS — N390 Urinary tract infection, site not specified: Secondary | ICD-10-CM | POA: Diagnosis present

## 2023-05-10 DIAGNOSIS — J9602 Acute respiratory failure with hypercapnia: Secondary | ICD-10-CM | POA: Diagnosis present

## 2023-05-10 DIAGNOSIS — Z79899 Other long term (current) drug therapy: Secondary | ICD-10-CM

## 2023-05-10 DIAGNOSIS — C7801 Secondary malignant neoplasm of right lung: Secondary | ICD-10-CM | POA: Diagnosis present

## 2023-05-10 DIAGNOSIS — Z853 Personal history of malignant neoplasm of breast: Secondary | ICD-10-CM

## 2023-05-10 LAB — CBC WITH DIFFERENTIAL/PLATELET
Abs Immature Granulocytes: 0.19 10*3/uL — ABNORMAL HIGH (ref 0.00–0.07)
Basophils Absolute: 0.1 10*3/uL (ref 0.0–0.1)
Basophils Relative: 0 %
Eosinophils Absolute: 0 10*3/uL (ref 0.0–0.5)
Eosinophils Relative: 0 %
HCT: 42.5 % (ref 36.0–46.0)
Hemoglobin: 12.2 g/dL (ref 12.0–15.0)
Immature Granulocytes: 1 %
Lymphocytes Relative: 4 %
Lymphs Abs: 0.7 10*3/uL (ref 0.7–4.0)
MCH: 25.1 pg — ABNORMAL LOW (ref 26.0–34.0)
MCHC: 28.7 g/dL — ABNORMAL LOW (ref 30.0–36.0)
MCV: 87.3 fL (ref 80.0–100.0)
Monocytes Absolute: 2.1 10*3/uL — ABNORMAL HIGH (ref 0.1–1.0)
Monocytes Relative: 11 %
Neutro Abs: 16.4 10*3/uL — ABNORMAL HIGH (ref 1.7–7.7)
Neutrophils Relative %: 84 %
Platelets: 364 10*3/uL (ref 150–400)
RBC: 4.87 MIL/uL (ref 3.87–5.11)
RDW: 16.3 % — ABNORMAL HIGH (ref 11.5–15.5)
WBC: 19.5 10*3/uL — ABNORMAL HIGH (ref 4.0–10.5)
nRBC: 0 % (ref 0.0–0.2)

## 2023-05-10 LAB — BASIC METABOLIC PANEL
Anion gap: 12 (ref 5–15)
BUN: 42 mg/dL — ABNORMAL HIGH (ref 8–23)
CO2: 25 mmol/L (ref 22–32)
Calcium: 8.6 mg/dL — ABNORMAL LOW (ref 8.9–10.3)
Chloride: 100 mmol/L (ref 98–111)
Creatinine, Ser: 0.76 mg/dL (ref 0.44–1.00)
GFR, Estimated: >60 mL/min (ref >60)
Glucose, Bld: 144 mg/dL — ABNORMAL HIGH (ref 70–99)
Potassium: 4.9 mmol/L (ref 3.5–5.1)
Sodium: 137 mmol/L (ref 135–145)

## 2023-05-10 LAB — MRSA NEXT GEN BY PCR, NASAL: MRSA by PCR Next Gen: NOT DETECTED

## 2023-05-10 LAB — URINALYSIS, W/ REFLEX TO CULTURE (INFECTION SUSPECTED)
Bilirubin Urine: NEGATIVE
Glucose, UA: NEGATIVE mg/dL
Hgb urine dipstick: NEGATIVE
Ketones, ur: NEGATIVE mg/dL
Nitrite: NEGATIVE
Protein, ur: 30 mg/dL — AB
Specific Gravity, Urine: 1.019 (ref 1.005–1.030)
WBC, UA: >50 WBC/hpf (ref 0–5)
pH: 5 (ref 5.0–8.0)

## 2023-05-10 LAB — GLUCOSE, CAPILLARY
Glucose-Capillary: 148 mg/dL — ABNORMAL HIGH (ref 70–99)
Glucose-Capillary: 115 mg/dL — ABNORMAL HIGH (ref 70–99)
Glucose-Capillary: 128 mg/dL — ABNORMAL HIGH (ref 70–99)

## 2023-05-10 LAB — COMPREHENSIVE METABOLIC PANEL
ALT: 65 U/L — ABNORMAL HIGH (ref 0–44)
ALT: 70 U/L — ABNORMAL HIGH (ref 0–44)
AST: 74 U/L — ABNORMAL HIGH (ref 15–41)
AST: 91 U/L — ABNORMAL HIGH (ref 15–41)
Albumin: 3.2 g/dL — ABNORMAL LOW (ref 3.5–5.0)
Albumin: 2.8 g/dL — ABNORMAL LOW (ref 3.5–5.0)
Alkaline Phosphatase: 125 U/L (ref 38–126)
Alkaline Phosphatase: 108 U/L (ref 38–126)
Anion gap: 8 (ref 5–15)
Anion gap: 10 (ref 5–15)
BUN: 46 mg/dL — ABNORMAL HIGH (ref 8–23)
BUN: 43 mg/dL — ABNORMAL HIGH (ref 8–23)
CO2: 28 mmol/L (ref 22–32)
CO2: 24 mmol/L (ref 22–32)
Calcium: 8.3 mg/dL — ABNORMAL LOW (ref 8.9–10.3)
Calcium: 7.8 mg/dL — ABNORMAL LOW (ref 8.9–10.3)
Chloride: 99 mmol/L (ref 98–111)
Chloride: 101 mmol/L (ref 98–111)
Creatinine, Ser: 0.83 mg/dL (ref 0.44–1.00)
Creatinine, Ser: 0.72 mg/dL (ref 0.44–1.00)
GFR, Estimated: >60 mL/min (ref >60)
GFR, Estimated: >60 mL/min (ref >60)
Glucose, Bld: 195 mg/dL — ABNORMAL HIGH (ref 70–99)
Glucose, Bld: 158 mg/dL — ABNORMAL HIGH (ref 70–99)
Potassium: 6.5 mmol/L (ref 3.5–5.1)
Potassium: 6.1 mmol/L — ABNORMAL HIGH (ref 3.5–5.1)
Sodium: 135 mmol/L (ref 135–145)
Sodium: 135 mmol/L (ref 135–145)
Total Bilirubin: 0.7 mg/dL (ref 0.3–1.2)
Total Bilirubin: 1.3 mg/dL — ABNORMAL HIGH (ref 0.3–1.2)
Total Protein: 7.3 g/dL (ref 6.5–8.1)
Total Protein: 6.2 g/dL — ABNORMAL LOW (ref 6.5–8.1)

## 2023-05-10 LAB — BLOOD GAS, ARTERIAL
Acid-base deficit: 3.5 mmol/L — ABNORMAL HIGH (ref 0.0–2.0)
Bicarbonate: 25.2 mmol/L (ref 20.0–28.0)
FIO2: 100 %
MECHVT: 430 mL
O2 Saturation: 98.6 %
PEEP: 5 cmH2O
Patient temperature: 32.6
RATE: 20 resp/min
pCO2 arterial: 52 mmHg — ABNORMAL HIGH (ref 32–48)
pH, Arterial: 7.27 — ABNORMAL LOW (ref 7.35–7.45)
pO2, Arterial: 69 mmHg — ABNORMAL LOW (ref 83–108)

## 2023-05-10 LAB — LACTIC ACID, PLASMA
Lactic Acid, Venous: 1.8 mmol/L (ref 0.5–1.9)
Lactic Acid, Venous: 1.9 mmol/L (ref 0.5–1.9)

## 2023-05-10 LAB — TSH: TSH: 14.549 u[IU]/mL — ABNORMAL HIGH (ref 0.350–4.500)

## 2023-05-10 LAB — T4, FREE: Free T4: 1.14 ng/dL — ABNORMAL HIGH (ref 0.61–1.12)

## 2023-05-10 LAB — PROCALCITONIN: Procalcitonin: 105.07 ng/mL

## 2023-05-10 LAB — CBG MONITORING, ED: Glucose-Capillary: 165 mg/dL — ABNORMAL HIGH (ref 70–99)

## 2023-05-10 LAB — CK: Total CK: 75 U/L (ref 38–234)

## 2023-05-10 MED ORDER — ROCURONIUM BROMIDE 10 MG/ML (PF) SYRINGE: 100.0000 mg | PREFILLED_SYRINGE | Freq: Once | INTRAVENOUS | Status: DC

## 2023-05-10 MED ORDER — ROCURONIUM BROMIDE 10 MG/ML (PF) SYRINGE
PREFILLED_SYRINGE | INTRAVENOUS | Status: AC
  Filled 2023-05-10: qty 10

## 2023-05-10 MED ORDER — ETOMIDATE 2 MG/ML IV SOLN
INTRAVENOUS | Status: AC
  Filled 2023-05-10: qty 10

## 2023-05-10 MED ORDER — ACETAMINOPHEN 160 MG/5ML PO SOLN
650.0000 mg | ORAL | Status: DC | PRN
  Administered 2023-05-11: 650 mg
  Filled 2023-05-10: qty 20.3

## 2023-05-10 MED ORDER — ROCURONIUM BROMIDE 50 MG/5ML IV SOLN
INTRAVENOUS | Status: AC | PRN
  Administered 2023-05-10: 100 mg via INTRAVENOUS

## 2023-05-10 MED ORDER — PIPERACILLIN-TAZOBACTAM 3.375 G IVPB 30 MIN
3.3750 g | Freq: Once | INTRAVENOUS | Status: AC
  Administered 2023-05-10: 3.375 g via INTRAVENOUS
  Filled 2023-05-10: qty 50

## 2023-05-10 MED ORDER — POLYETHYLENE GLYCOL 3350 17 G PO PACK: 17.0000 g | PACK | Freq: Every day | ORAL | Status: DC

## 2023-05-10 MED ORDER — FAMOTIDINE 20 MG PO TABS: 20.0000 mg | ORAL_TABLET | Freq: Two times a day (BID) | ORAL | Status: DC

## 2023-05-10 MED ORDER — POLYETHYLENE GLYCOL 3350 17 G PO PACK: 17.0000 g | PACK | Freq: Every day | ORAL | Status: DC | PRN

## 2023-05-10 MED ORDER — ETOMIDATE 2 MG/ML IV SOLN: 20.0000 mg | Freq: Once | INTRAVENOUS | Status: DC

## 2023-05-10 MED ORDER — FAMOTIDINE 20 MG PO TABS
20.0000 mg | ORAL_TABLET | Freq: Two times a day (BID) | ORAL | Status: DC
  Administered 2023-05-10 – 2023-05-12 (×4): 20 mg
  Filled 2023-05-10 (×5): qty 1

## 2023-05-10 MED ORDER — EPINEPHRINE 1 MG/10ML IJ SOSY
PREFILLED_SYRINGE | INTRAMUSCULAR | Status: AC
  Filled 2023-05-10: qty 10

## 2023-05-10 MED ORDER — IOHEXOL 350 MG/ML SOLN
75.0000 mL | Freq: Once | INTRAVENOUS | Status: AC | PRN
  Administered 2023-05-10: 75 mL via INTRAVENOUS

## 2023-05-10 MED ORDER — HEPARIN SODIUM (PORCINE) 5000 UNIT/ML IJ SOLN
5000.0000 [IU] | Freq: Three times a day (TID) | INTRAMUSCULAR | Status: DC
  Administered 2023-05-11 – 2023-05-12 (×3): 5000 [IU] via SUBCUTANEOUS
  Filled 2023-05-10 (×4): qty 1

## 2023-05-10 MED ORDER — ALBUMIN HUMAN 25 % IV SOLN
25.0000 g | Freq: Once | INTRAVENOUS | Status: AC
  Administered 2023-05-10: 25 g via INTRAVENOUS
  Filled 2023-05-10: qty 100

## 2023-05-10 MED ORDER — FENTANYL CITRATE PF 50 MCG/ML IJ SOSY
25.0000 ug | PREFILLED_SYRINGE | INTRAMUSCULAR | Status: DC | PRN
  Administered 2023-05-10 – 2023-05-11 (×3): 50 ug via INTRAVENOUS
  Filled 2023-05-10 (×3): qty 1
  Filled 2023-05-10: qty 2

## 2023-05-10 MED ORDER — PHENYLEPHRINE 80 MCG/ML (10ML) SYRINGE FOR IV PUSH (FOR BLOOD PRESSURE SUPPORT)
120.0000 ug | PREFILLED_SYRINGE | Freq: Once | INTRAVENOUS | Status: AC | PRN
  Administered 2023-05-10: 120 ug via INTRAVENOUS
  Filled 2023-05-10: qty 10

## 2023-05-10 MED ORDER — DEXMEDETOMIDINE HCL IN NACL 400 MCG/100ML IV SOLN
0.0000 ug/kg/h | INTRAVENOUS | Status: DC
  Administered 2023-05-11: 1.5 ug/kg/h via INTRAVENOUS
  Administered 2023-05-11: 1.2 ug/kg/h via INTRAVENOUS
  Administered 2023-05-11 (×2): 1.5 ug/kg/h via INTRAVENOUS
  Administered 2023-05-11: 1.3 ug/kg/h via INTRAVENOUS
  Administered 2023-05-12 (×2): 1.5 ug/kg/h via INTRAVENOUS
  Filled 2023-05-10 (×8): qty 100

## 2023-05-10 MED ORDER — CALCIUM GLUCONATE 10 % IV SOLN
1.0000 g | Freq: Once | INTRAVENOUS | Status: AC
  Administered 2023-05-10: 1 g via INTRAVENOUS
  Filled 2023-05-10: qty 10

## 2023-05-10 MED ORDER — SODIUM CHLORIDE 0.9 % IV BOLUS
500.0000 mL | Freq: Once | INTRAVENOUS | Status: AC
  Administered 2023-05-10: 500 mL via INTRAVENOUS

## 2023-05-10 MED ORDER — DEXMEDETOMIDINE HCL IN NACL 200 MCG/50ML IV SOLN
0.0000 ug/kg/h | INTRAVENOUS | Status: DC
  Administered 2023-05-10: 1 ug/kg/h via INTRAVENOUS
  Administered 2023-05-10: 0.4 ug/kg/h via INTRAVENOUS
  Filled 2023-05-10 (×2): qty 50

## 2023-05-10 MED ORDER — ORAL CARE MOUTH RINSE: 15.0000 mL | OROMUCOSAL | Status: DC | PRN

## 2023-05-10 MED ORDER — FUROSEMIDE 10 MG/ML IJ SOLN
40.0000 mg | Freq: Once | INTRAMUSCULAR | Status: AC
  Administered 2023-05-10: 40 mg via INTRAVENOUS
  Filled 2023-05-10: qty 4

## 2023-05-10 MED ORDER — ORAL CARE MOUTH RINSE
15.0000 mL | OROMUCOSAL | Status: DC
  Administered 2023-05-10 – 2023-05-12 (×21): 15 mL via OROMUCOSAL

## 2023-05-10 MED ORDER — MIDODRINE HCL 5 MG PO TABS
10.0000 mg | ORAL_TABLET | Freq: Three times a day (TID) | ORAL | Status: DC
  Administered 2023-05-11 – 2023-05-12 (×5): 10 mg
  Filled 2023-05-10 (×6): qty 2

## 2023-05-10 MED ORDER — PIPERACILLIN-TAZOBACTAM 3.375 G IVPB
3.3750 g | Freq: Three times a day (TID) | INTRAVENOUS | Status: DC
  Administered 2023-05-10 – 2023-05-12 (×7): 3.375 g via INTRAVENOUS
  Filled 2023-05-10 (×7): qty 50

## 2023-05-10 MED ORDER — ALBUTEROL SULFATE (2.5 MG/3ML) 0.083% IN NEBU
10.0000 mg | INHALATION_SOLUTION | Freq: Once | RESPIRATORY_TRACT | Status: AC
  Administered 2023-05-10: 10 mg via RESPIRATORY_TRACT
  Filled 2023-05-10: qty 12

## 2023-05-10 MED ORDER — SODIUM CHLORIDE 0.9 % IV SOLN
250.0000 mL | INTRAVENOUS | Status: DC
  Administered 2023-05-10: 250 mL via INTRAVENOUS

## 2023-05-10 MED ORDER — ETOMIDATE 2 MG/ML IV SOLN
INTRAVENOUS | Status: AC | PRN
  Administered 2023-05-10: 20 mg via INTRAVENOUS

## 2023-05-10 MED ORDER — ONDANSETRON HCL 4 MG/2ML IJ SOLN: 4.0000 mg | Freq: Four times a day (QID) | INTRAMUSCULAR | Status: DC | PRN

## 2023-05-10 MED ORDER — DOCUSATE SODIUM 50 MG/5ML PO LIQD: 100.0000 mg | Freq: Two times a day (BID) | ORAL | Status: DC

## 2023-05-10 MED ORDER — FENTANYL CITRATE PF 50 MCG/ML IJ SOSY
25.0000 ug | PREFILLED_SYRINGE | INTRAMUSCULAR | Status: DC | PRN
  Administered 2023-05-10: 25 ug via INTRAVENOUS

## 2023-05-10 MED ORDER — DEXMEDETOMIDINE HCL IN NACL 200 MCG/50ML IV SOLN: 0.0000 ug/kg/h | INTRAVENOUS | Status: DC

## 2023-05-10 MED ORDER — CHLORHEXIDINE GLUCONATE CLOTH 2 % EX PADS
6.0000 | MEDICATED_PAD | Freq: Every day | CUTANEOUS | Status: DC
  Administered 2023-05-10 – 2023-05-12 (×2): 6 via TOPICAL

## 2023-05-10 MED ORDER — NOREPINEPHRINE 4 MG/250ML-% IV SOLN
2.0000 ug/min | INTRAVENOUS | Status: DC
  Administered 2023-05-10: 2 ug/min via INTRAVENOUS
  Administered 2023-05-11: 3 ug/min via INTRAVENOUS
  Administered 2023-05-12 (×2): 7 ug/min via INTRAVENOUS
  Filled 2023-05-10 (×4): qty 250

## 2023-05-10 MED ORDER — DOCUSATE SODIUM 50 MG/5ML PO LIQD: 100.0000 mg | Freq: Two times a day (BID) | ORAL | Status: DC | PRN

## 2023-05-10 NOTE — ED Notes (Signed)
Bare hugger applied. Patients rectal temperature 90.59F.

## 2023-05-10 NOTE — Progress Notes (Signed)
RT decreased RR to 16 from 20 due to ABG.

## 2023-05-10 NOTE — Progress Notes (Signed)
An USGPIV (ultrasound guided PIV) has been placed for short-term vasopressor infusion. A correctly placed ivWatch must be used when administering Vasopressors. Should this treatment be needed beyond 72 hours, central line access should be obtained.  It will be the responsibility of the bedside nurse to follow best practice to prevent extravasations.   

## 2023-05-10 NOTE — Progress Notes (Addendum)
eLink Physician-Brief Progress Note Patient Name: JOVANI COLQUHOUN DOB: 10/02/1941 MRN: [id number]   Date of Service  05/10/2023  HPI/Events of Note  82 year old with history of medullary thyroid cancer with bony and lung metastasis and previous breast cancer that presented to the emergency department on 7/10 found to be unresponsive by family.  She was intubated and treated for acute hypoxic hypercapnic respiratory failure in the setting of aspiration pneumonia.  She was diuresed earlier today, now hypotensive 91/37.  Concurrently on Precedex at 1 mcg.  eICU Interventions  Attempt albumin 25% bolus.  Add midodrine.  May need to initiate peripheral norepinephrine   2145 -  BP had dropped down in the 60's but is back to 84/62; Add peripheral NE  2321 -pulling at ET tube, and restraints, renew Precedex order 0435 - Still agitated and attempting to mouth words. Nods yes to wanting her ETT out. Resume her home alprazolam.  Give one-time Versed.  Add Versed as needed to maintain RASS goal of -2.  Maintain spontaneous awakening and breathing trial in the morning.  Intervention Category Intermediate Interventions: Hypotension - evaluation and management  Kia Stavros 05/10/2023, 8:33 PM

## 2023-05-10 NOTE — ED Notes (Addendum)
Dr. Rubin Payor ordering medication for intubation. Patient unable to protect airway. GCS 3 on arrival.  Verbal order: -20mg  IV etomidate (given at 9:44am) -100 Roc IV (given at 9:45am)

## 2023-05-10 NOTE — H&P (Addendum)
NAME:  Shannon [name], MRN:  [id number], DOB:  1941-04-14, LOS: 0 ADMISSION DATE:  05/10/2023, CONSULTATION DATE:  05/10/2023 REFERRING MD:  Dr. 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  Last seen normal around 10:30 on 7/9 found a.m. 7/10 around 8:30.  EMS assisted ventilations and route to ED.  On ED arrival patient was intubated with etomidate and roc.  Vital signs on arrival significant for significant hypothermia with rectal temperature 85.7 and hypotension.  Lab work significant for K6.5, glucose 195, BUN 46, calcium 8.3, albumin 3.2, AST 74, ALT 65, WBC 19.5, lactic within normal at 1.8, CK also normal at 75.  UA with rare bacteria negative nitrates and large leukocytes.  Chest x-ray with likely aspiration and underlying pulmonary edema.  Head CT negative for acute intracranial abnormalities and osseous metastatic disease including left mandible.  CT spine with no acute abnormalities, diffuse osseous metastatic disease seen.  Given need for advanced airway PCCM consulted for further management and admission.  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 rate 20, height 5\' 4"  (1.626 m), weight 32 kg, SpO2 100 %.    Vent Mode:  PRVC FiO2 (%):  [100 %] 100 % Set Rate:  [16 bmp-20 bmp] 16 bmp Vt Set:  [435 mL] 435 mL PEEP:  [5 cmH20] 5 cmH20 Plateau Pressure:  [26 cmH20] 26 cmH20  No intake or output data in the 24 hours ending 05/10/23 1141 Filed Weights   05/10/23 0937  Weight: 32 kg    Examination: General: Chronically ill appearing elderly woman on mechanical ventilation, in NAD HEENT: ETT, MM pink/moist, PERRL,  Neuro: Unresponsive, RASS -3, no meningeal signs, pupils 3 mm bilateral reactive to light CV: s1s2 regular rate and rhythm, no murmur, rubs, or gallops,  PULM: Bilateral ventilated breath sounds, no accessory muscle use GI: soft, bowel sounds active in all 4 quadrants, non-tender, non-distended, Extremities: warm/dry, 1+ edema  Skin: no rashes or lesions  ABG shows acute respiratory acidosis and severe hypoxia with pO2 69 on 100% Repeat labs show potassium decreased to 6.1, BUN/creatinine 43 /0.7 Lactic 1.9 UA shows WBC clumps and more than 50 white cells  Resolved Hospital Problem list     Assessment & Plan:  Acute Hypoxic and Hypercapnic Respiratory Failure  Aspiration pneumonia -In the setting of aspiration pneumonia and likely underlying pulmonary edema P: Continue ventilator support with lung protective strategies  Wean PEEP and FiO2 for sats greater than 90%. Head of bed elevated 30 degrees. Plateau pressures less than 30 cm H20.  Follow intermittent chest x-ray and ABG.   SAT/SBT as tolerated, mentation preclude extubation  Ensure adequate pulmonary hygiene  Follow cultures  VAP bundle in place  PAD protocol with fentanyl as needed, goal RASS 0 Given severe hypoxia, obtain  CT angio chest Lasix 40 mg x 1  Sepsis -hypothermia and leukocytosis raises the question of sepsis likely due to aspiration pneumonia or UTI -Empiric Zosyn, await culture data  Hyperkalemia -Potassium 6.5 on admission, creatinine stable but BUN is also elevated at 46 P: Hydration provided on  admission, has history of chronic diarrhea so we will hold off Lokelma, will use bicarbonate and calcium x 1 Trend CHEM panel Monitor urine output Avoid nephrotoxins  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  Hypothyroidism s/p total thyroidectomy January 2011 with bilateral neck dissection P: Outpatient follow-up Supportive care  Paroxysmal atrial fibrillation -Per PCP note in care everywhere patient experienced intermittent episodes of atrial fibrillation during ICU stay for pneumonia May of this year  P: Continue beta-blocker when able  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  Out of facility DNR reassured.  He requested intubation but no CPR no cardioversion, if does not improve within the next 48 hours we will proceed with full comfort.  He is also waiting for his brother to return from [place]  Labs   CBC: Recent Labs  Lab 05/10/23 0945  WBC 19.5*  NEUTROABS 16.4*  HGB 12.2  HCT 42.5  MCV 87.3  PLT 364    Basic Metabolic Panel: Recent Labs  Lab 05/10/23 0945  NA 135  K 6.5*  CL 99  CO2 28  GLUCOSE 195*  BUN 46*  CREATININE 0.83  CALCIUM 8.3*   GFR: Estimated Creatinine Clearance: 26.4 mL/min (by C-G formula based on SCr of 0.83 mg/dL). Recent Labs  Lab 05/10/23 0945  WBC 19.5*  LATICACIDVEN 1.8    Liver Function Tests: Recent Labs  Lab 05/10/23 0945  AST 74*  ALT 65*  ALKPHOS 125  BILITOT 0.7  PROT 7.3  ALBUMIN  3.2*   No results for input(s): "LIPASE", "AMYLASE" in the last 168 hours. No results for input(s): "AMMONIA" in the last 168 hours.  ABG    Component Value Date/Time   PHART 7.27 (L) 05/10/2023 1059   PCO2ART 52 (H) 05/10/2023 1059   PO2ART 69 (L) 05/10/2023 1059   HCO3 25.2 05/10/2023 1059   ACIDBASEDEF 3.5 (H) 05/10/2023 1059   O2SAT 98.6 05/10/2023 1059     Coagulation Profile: No results for input(s): "INR", "PROTIME" in the last 168 hours.  Cardiac Enzymes: Recent Labs  Lab 05/10/23 0945  CKTOTAL 75    HbA1C: Hgb A1c MFr Bld  Date/Time Value Ref Range Status  02/12/2023 03:05 AM 6.0 (H) 4.8 - 5.6 % Final    Comment:    (NOTE) Pre diabetes:          5.7%-6.4%  Diabetes:              >6.4%  Glycemic control for   <7.0% adults with diabetes     CBG: Recent Labs  Lab 05/10/23 0936  GLUCAP 165*    Review of Systems:   Unable to assess   Past Medical History:  She,  has a past  medical history of Anxiety, Breast cancer (HCC) (1999), Cancer [facility]), Colon polyp, Contact lens/glasses fitting, Lung cancer (HCC) (2015), Medullary carcinoma of thyroid (HCC) (2011), and Osteoporosis.   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   CATARACT EXTRACTION W/ INTRAOCULAR LENS IMPLANT  2009   BOTH EYES   FEMUR IM NAIL Left 10/23/2017   Procedure: INTRAMEDULLARY (IM) NAIL FEMORAL;  Surgeon: Durene Romans, MD;  Location: [facility] OR;  Service: Orthopedics;  Laterality: Left;   FOOT SURGERY  1070   RIGHT FOOT - PINCHED NERVE   IR GASTROSTOMY TUBE MOD SED  01/13/2021   IR REPLACE G-TUBE SIMPLE WO FLUORO  03/08/2022   IR REPLACE G-TUBE SIMPLE WO FLUORO  11/01/2022   IR REPLC DUODEN/JEJUNO TUBE PERCUT W/FLUORO  02/13/2023   IR REPLC GASTRO/COLONIC TUBE PERCUT W/FLUORO  05/17/2022   LUNG SURGERY      CALCIFICATIONS, POSITIVE - CANCER LYMPHNODE    MELANOMA EXCISION  2008   CHEST AREA   NECK SURGERY  Nov 09 2011   LYMPHNODES REMOVED 2 POSITIVE    RIGHT COLECTOMY  06/02/2009   THYROIDECTOMY  11/06/2009   TONSILLECTOMY AND ADENOIDECTOMY       Social History:   reports that she quit smoking about 62 years ago. Her smoking use included cigarettes. She has never used smokeless tobacco. She reports that she does not drink alcohol and does not use drugs.   Family History:  Her family history includes Breast cancer in her maternal aunt and sister; Cancer in her brother, cousin, father, paternal aunt, sister, and another family member.   Allergies Allergies  Allergen Reactions   Codeine Other (See Comments)    hyper Other reaction(s): insomnia     Home Medications  Prior to Admission medications   Medication Sig Start Date End Date Taking? Authorizing Provider  acetaminophen (TYLENOL) 500 MG tablet Take 500 mg by mouth 3 (three) times daily.   Yes [provider]  albuterol (VENTOLIN HFA) 108 (90 Base) MCG/ACT inhaler Inhale 1-2 puffs into the lungs every 6 (six) hours as needed for wheezing or shortness of breath. 06/16/22  Yes [provider]  ALPRAZolam Prudy Feeler) 1 MG tablet Take 0.5 tablets (0.5 mg total) by mouth 2 (two) times daily. 02/20/23  Yes Azucena Fallen, MD  bacitracin ophthalmic ointment Place 1 Application into both eyes 3 (three) times daily. 08/15/20  Yes [provider]  carvedilol (COREG) 3.125 MG tablet Take 1 tablet (3.125 mg total) by mouth 2 (two) times daily with a meal. 02/20/23  Yes Azucena Fallen, MD  cetirizine (ZYRTEC) 10 MG tablet Take 10 mg by mouth daily.   Yes [provider]  Cholecalciferol (VITAMIN D) 1000 UNITS capsule Take 1,000 Units by mouth daily.   Yes [provider]  diphenhydramine-acetaminophen (TYLENOL PM) 25-500 MG TABS tablet Take 1 tablet by mouth at bedtime as needed (Sleep/Pain).   Yes  [provider]  fluticasone (FLONASE) 50 MCG/ACT nasal spray Place 1 spray into both nostrils daily. 02/20/23  Yes Azucena Fallen, MD  Glucosamine-Chondroitin 250-200 MG CAPS Take 1 tablet by mouth daily. 12/23/10  Yes [provider]  glycopyrrolate (ROBINUL) 1 MG tablet Place 1 mg into feeding tube in the morning and at bedtime. 04/06/23  Yes [provider]  Infant Care Products [facility] EX) Apply 1 Application topically See admin instructions. Apply to buttocks with each incontinent episode.   Yes [provider]  latanoprost (XALATAN) 0.005 % ophthalmic solution Place 1 drop into both eyes at bedtime. 01/18/23  Yes [provider]  levothyroxine (SYNTHROID) 137 MCG tablet Take 137 mcg by mouth daily before breakfast.   Yes [provider]  liver oil-zinc oxide (DESITIN) 40 % ointment Apply topically as needed for irritation. 02/20/23  Yes Azucena Fallen, MD  loperamide (IMODIUM A-D) 2 MG tablet Take 2 mg by mouth 3 (three) times daily.   Yes [provider]  Multiple Vitamins-Minerals (CEROVITE SENIOR PO) Take 1 tablet by mouth daily.   Yes [provider]  Nutritional Supplements (FEEDING SUPPLEMENT, OSMOLITE 1.5 CAL,) LIQD Osmolite 1.5 via J tube at 120 mL/hr for 14 hours from 6 pm to 8 am. Flush with 200 mL water 6 times daily including before and after continuous feeding. Provides 2485 cal, 104 gm, 2467 mL free water/100% estimated needs. SEND PUMP ASAP. 03/13/23   Johney Maine, MD  nystatin (MYCOSTATIN/NYSTOP) powder Apply 1 Application topically in the morning and at bedtime. Apply to groin and vaginal area twice a day. 02/21/23  Yes [provider]  ondansetron (ZOFRAN) 8 MG tablet TAKE 1 TABLET BY MOUTH EVERY 8 HOURS AS NEEDED FOR NAUSEA OR VOMITING 05/08/23  Yes Johney Maine, MD  pantoprazole (PROTONIX) 2 mg/mL suspension Place 40 mg into feeding tube 2 (two) times daily.   Yes [provider]  sucralfate (CARAFATE) 1 GM/10ML suspension Place 1 g into feeding tube 4 (four) times daily. 02/28/23  Yes [provider]  Vitamin D, Ergocalciferol, (DRISDOL) 1.25 MG (50000 UNIT) CAPS capsule Take 1 capsule by mouth once a week. 03/21/23 03/20/24 Yes [provider]  vitamin E 180 MG (400 UNITS) capsule Take 400 Units by mouth daily.   Yes [provider]     Critical care time: 29 m     Cyril Mourning MD. FCCP. [facility] Pager : [phone]  If no response to pager , please call [phone] until 7 pm After 7:00 pm call Elink  [phone]   05/10/2023

## 2023-05-10 NOTE — Progress Notes (Signed)
RT NOTE:  Pt transported to and from CT via ventilator with no complications noted. BMV at bedside at all times.

## 2023-05-10 NOTE — ED Triage Notes (Addendum)
Patient BIB [facility] from home. Was last seen normal last night at 1030pm. Found today at 830am on the floor face down in the bathroom. Unknown when she fell. En route and on arrival EMS assisted ventilations. Not on a blood thinner per son. Congestion in chest. Has a J tube for feeds. Patient complained of a posterior headache yesterday.  DNR at bedside. Dr. Rubin Payor at bedside.  EMS 118/70 70 HR 75% room air saturation

## 2023-05-10 NOTE — ED Notes (Signed)
ED TO INPATIENT HANDOFF REPORT  Name/Age/Gender Shannon [name] 82 y.o. female  Code Status    Code Status Orders  (From admission, onward)           Start     Ordered   05/10/23 1249  Do not attempt resuscitation (DNR)  Continuous       Question Answer Comment  If patient has no pulse and is not breathing Do Not Attempt Resuscitation   If patient has a pulse and/or is breathing: Medical Treatment Goals MEDICAL INTERVENTIONS DESIRED: Use advanced airway interventions, mechanical ventilation or cardioversion in appropriate circumstances; Use medication/IV fluids as indicated; Provide comfort medications; Transfer to Progressive/Stepdown/ICU as indicated.   Consent: Discussion documented in EHR or advanced directives reviewed      05/10/23 1257           Code Status History     Date Active Date Inactive Code Status Order ID Comments User Context   02/16/2023 0911 02/20/2023 2036 DNR [id number]  Azucena Fallen, MD Inpatient   02/10/2023 1759 02/16/2023 0911 Full Code [id number]  Maryln Gottron, MD ED   10/23/2017 1733 10/27/2017 1757 Full Code [id number]  Calvert Cantor, MD Inpatient      Advance Directive Documentation    Flowsheet Row Most Recent Value  Type of Advance Directive Out of facility DNR (pink MOST or yellow form)  Pre-existing out of facility DNR order (yellow form or pink MOST form) --  "MOST" Form in Place? --       Home/SNF/Other Home  Chief Complaint Acute encephalopathy [G93.40]  Level of Care/Admitting Diagnosis ED Disposition     ED Disposition  Admit   Condition  --   Comment  Hospital Area: [facility] [[id number]]  Level of Care: ICU [6]  May admit patient to [facility] or [facility] if equivalent level of care is available:: Yes  Covid Evaluation: Asymptomatic - no recent exposure (last 10 days) testing not required  Diagnosis: Acute encephalopathy [086578]  Admitting Physician: Oretha Milch [[id number]]  Attending  Physician: Oretha Milch [[id number]]  Certification:: I certify this patient will need inpatient services for at least 2 midnights  Estimated Length of Stay: 3          Medical History Past Medical History:  Diagnosis Date   Anxiety    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   Cancer (HCC)    Colon polyp    Contact lens/glasses fitting    HAS LENS IMPLANTS   Lung cancer (HCC) 2015   LYMPHNODE REMOVAL    Medullary carcinoma of thyroid (HCC) 2011   Osteoporosis     Allergies Allergies  Allergen Reactions   Codeine Other (See Comments)    hyper Other reaction(s): insomnia    IV Location/Drains/Wounds Patient Lines/Drains/Airways Status     Active Line/Drains/Airways     Name Placement date Placement time Site Days   Peripheral IV 05/10/23 20 G Left Antecubital 05/10/23  0948  Antecubital  less than 1   Peripheral IV 05/10/23 20 G Anterior;Right Forearm 05/10/23  0949  Forearm  less than 1   Peripheral IV 05/10/23 20 G Left;Posterior Hand 05/10/23  1005  Hand  less than 1   NG/OG Vented/Dual Lumen 14 Fr. Oral External length of tube 05/10/23  1003  Oral  less than 1   Gastrostomy/Enterostomy Jejunostomy 18 Fr. LLQ 02/13/23  1237  LLQ  86   Urethral Catheter Lillia Abed RN Temperature  probe 14 Fr. 05/10/23  1000  Temperature probe  less than 1   Airway 7.5 mm 05/10/23  0945  -- less than 1            Labs/Imaging Results for orders placed or performed during the hospital encounter of 05/10/23 (from the past 48 hour(s))  CBG monitoring, ED     Status: Abnormal   Collection Time: 05/10/23  9:36 AM  Result Value Ref Range   Glucose-Capillary 165 (H) 70 - 99 mg/dL    Comment: Glucose reference range applies only to samples taken after fasting for at least 8 hours.  Comprehensive metabolic panel     Status: Abnormal   Collection Time: 05/10/23  9:45 AM  Result Value Ref Range   Sodium 135 135 - 145 mmol/L   Potassium 6.5 (HH) 3.5 -  5.1 mmol/L    Comment: CRITICAL RESULT CALLED TO, READ BACK BY AND VERIFIED WITH KISER,C. RN AT 1048 05/10/23 MULLINS,T NO VISIBLE HEMOLYSIS    Chloride 99 98 - 111 mmol/L   CO2 28 22 - 32 mmol/L   Glucose, Bld 195 (H) 70 - 99 mg/dL    Comment: Glucose reference range applies only to samples taken after fasting for at least 8 hours.   BUN 46 (H) 8 - 23 mg/dL   Creatinine, Ser 4.09 0.44 - 1.00 mg/dL   Calcium 8.3 (L) 8.9 - 10.3 mg/dL   Total Protein 7.3 6.5 - 8.1 g/dL   Albumin 3.2 (L) 3.5 - 5.0 g/dL   AST 74 (H) 15 - 41 U/L   ALT 65 (H) 0 - 44 U/L   Alkaline Phosphatase 125 38 - 126 U/L   Total Bilirubin 0.7 0.3 - 1.2 mg/dL   GFR, Estimated >81 >19 mL/min    Comment: (NOTE) Calculated using the CKD-EPI Creatinine Equation (2021)    Anion gap 8 5 - 15    Comment: Performed at [facility], [address], [place], [place] [address]  CBC with Differential     Status: Abnormal   Collection Time: 05/10/23  9:45 AM  Result Value Ref Range   WBC 19.5 (H) 4.0 - 10.5 K/uL   RBC 4.87 3.87 - 5.11 MIL/uL   Hemoglobin 12.2 12.0 - 15.0 g/dL   HCT 95.6 21.3 - 08.6 %   MCV 87.3 80.0 - 100.0 fL   MCH 25.1 (L) 26.0 - 34.0 pg   MCHC 28.7 (L) 30.0 - 36.0 g/dL   RDW 57.8 (H) 46.9 - 62.9 %   Platelets 364 150 - 400 K/uL    Comment: REPEATED TO VERIFY   nRBC 0.0 0.0 - 0.2 %   Neutrophils Relative % 84 %   Neutro Abs 16.4 (H) 1.7 - 7.7 K/uL   Lymphocytes Relative 4 %   Lymphs Abs 0.7 0.7 - 4.0 K/uL   Monocytes Relative 11 %   Monocytes Absolute 2.1 (H) 0.1 - 1.0 K/uL   Eosinophils Relative 0 %   Eosinophils Absolute 0.0 0.0 - 0.5 K/uL   Basophils Relative 0 %   Basophils Absolute 0.1 0.0 - 0.1 K/uL   Immature Granulocytes 1 %   Abs Immature Granulocytes 0.19 (H) 0.00 - 0.07 K/uL    Comment: Performed at [facility], [address] [place], [place] [address]  Lactic acid, plasma     Status: None   Collection Time: 05/10/23  9:45 AM  Result Value  Ref Range   Lactic Acid, Venous 1.8 0.5 -  1.9 mmol/L    Comment: Performed at [facility], [address] [place], [place] [address]  CK     Status: None   Collection Time: 05/10/23  9:45 AM  Result Value Ref Range   Total CK 75 38 - 234 U/L    Comment: Performed at [facility], [address] [place], [place] [address]  Urinalysis, w/ Reflex to Culture (Infection Suspected) -Urine, Unspecified Source     Status: Abnormal   Collection Time: 05/10/23 10:06 AM  Result Value Ref Range   Specimen Source URINE, UNSPE    Color, Urine YELLOW YELLOW   APPearance CLOUDY (A) CLEAR   Specific Gravity, Urine 1.019 1.005 - 1.030   pH 5.0 5.0 - 8.0   Glucose, UA NEGATIVE NEGATIVE mg/dL   Hgb urine dipstick NEGATIVE NEGATIVE   Bilirubin Urine NEGATIVE NEGATIVE   Ketones, ur NEGATIVE NEGATIVE mg/dL   Protein, ur 30 (A) NEGATIVE mg/dL   Nitrite NEGATIVE NEGATIVE   Leukocytes,Ua LARGE (A) NEGATIVE   RBC / HPF 11-20 0 - 5 RBC/hpf   WBC, UA >50 0 - 5 WBC/hpf    Comment:        Reflex urine culture not performed if WBC <=10, OR if Squamous epithelial cells >5. If Squamous epithelial cells >5 suggest recollection.    Bacteria, UA RARE (A) NONE SEEN   Squamous Epithelial / HPF 0-5 0 - 5 /HPF   WBC Clumps PRESENT    Mucus PRESENT    Hyaline Casts, UA PRESENT     Comment: Performed at [facility], [address] [place], [place] [address]  Blood gas, arterial     Status: Abnormal   Collection Time: 05/10/23 10:59 AM  Result Value Ref Range   FIO2 100 %   Delivery systems VENTILATOR    Mode PRESSURE REGULATED VOLUME CONTROL    MECHVT 430 mL   RATE 20 resp/min   PEEP 5 cm H20   pH, Arterial 7.27 (L) 7.35 - 7.45   pCO2 arterial 52 (H) 32 - 48 mmHg   pO2, Arterial 69 (L) 83 - 108 mmHg   Bicarbonate 25.2 20.0 - 28.0 mmol/L   Acid-base deficit 3.5 (H) 0.0 - 2.0 mmol/L   O2 Saturation 98.6 %   Patient temperature 32.6    Collection  site LEFT BRACHIAL    Allens test (pass/fail) PASS PASS    Comment: Performed at [facility], [address], [place], [place] [address]  Lactic acid, plasma     Status: None   Collection Time: 05/10/23 11:15 AM  Result Value Ref Range   Lactic Acid, Venous 1.9 0.5 - 1.9 mmol/L    Comment: Performed at [facility], [address] [place], [place] [address]  Comprehensive metabolic panel     Status: Abnormal   Collection Time: 05/10/23 11:15 AM  Result Value Ref Range   Sodium 135 135 - 145 mmol/L   Potassium 6.1 (H) 3.5 - 5.1 mmol/L   Chloride 101 98 - 111 mmol/L   CO2 24 22 - 32 mmol/L   Glucose, Bld 158 (H) 70 - 99 mg/dL    Comment: Glucose reference range applies only to samples taken after fasting for at least 8 hours.   BUN 43 (H) 8 - 23 mg/dL   Creatinine, Ser 1.30 0.44 - 1.00 mg/dL   Calcium 7.8 (L) 8.9 - 10.3 mg/dL   Total Protein 6.2 (L) 6.5 - 8.1 g/dL  Albumin 2.8 (L) 3.5 - 5.0 g/dL   AST 91 (H) 15 - 41 U/L   ALT 70 (H) 0 - 44 U/L   Alkaline Phosphatase 108 38 - 126 U/L   Total Bilirubin 1.3 (H) 0.3 - 1.2 mg/dL   GFR, Estimated >69 >62 mL/min    Comment: (NOTE) Calculated using the CKD-EPI Creatinine Equation (2021)    Anion gap 10 5 - 15    Comment: Performed at [facility], [address] [place], [place] [address]   CT Cervical Spine Wo Contrast  Result Date: 05/10/2023 CLINICAL DATA:  82 year old female found down at 0830 hours, last known well 2230 hours yesterday. History of recurrent medullary thyroid cancer. EXAM: CT CERVICAL SPINE WITHOUT CONTRAST TECHNIQUE: Multidetector CT imaging of the cervical spine was performed without intravenous contrast. Multiplanar CT image reconstructions were also generated. RADIATION DOSE REDUCTION: This exam was performed according to the departmental dose-optimization program which includes automated exposure control, adjustment of the mA and/or kV according to patient  size and/or use of iterative reconstruction technique. COMPARISON:  Neck CT last month.  Cervical spine CT 01/16/2023. FINDINGS: Alignment: Stable straightening of cervical lordosis since March. Cervicothoracic junction alignment is within normal limits. Bilateral posterior element alignment is within normal limits. Skull base and vertebrae: Heterogeneous sclerotic right occipital condyle and C1 ring metastases. But Visualized skull base is intact. No atlanto-occipital dissociation. C1 and C2 appear intact and aligned. Heterogeneous vertebral metastases throughout the cervical spine, more often sclerotic than lytic. But there is a lytic C7 body metastasis to the right of midline. No superimposed acute cervical spine fracture is identified. Soft tissues and spinal canal: No visible canal hematoma. Intubated and oral enteric tube. These course appropriately into the airway and cervical esophagus. Numerous bilateral neck surgical clips, with generalized new indistinct neck soft tissue swelling, subcutaneous edema. This might be sequelae of XRT, uncertain. But there is a small superimposed retropharyngeal effusion, also new from last month. Disc levels: Stable since March. No CT evidence of cervical spinal stenosis. Upper chest: Partially calcified confluent apical lung scarring again noted. Upper thoracic sclerotic metastases. Other findings: Permeated left mandible condyle metastasis. IMPRESSION: 1. No acute osseous injury identified in the cervical spine. Diffuse spine and bone metastases. 2. New from last month nonspecific generalized neck soft tissue thickening and inflammation. Associated small retropharyngeal effusion. Has there been interval XRT? 3. Underlying chronic postoperative changes to the bilateral neck. 4. Satisfactory visible ET tube and enteric tube. Electronically Signed   By: Odessa Fleming M.D.   On: 05/10/2023 11:30   CT HEAD WO CONTRAST ( )  Result Date: 05/10/2023 CLINICAL DATA:  82 year old  female found down at 0830 hours, last known well 2230 hours yesterday. History of recurrent medullary thyroid cancer. EXAM: CT HEAD WITHOUT CONTRAST TECHNIQUE: Contiguous axial images were obtained from the base of the skull through the vertex without intravenous contrast. RADIATION DOSE REDUCTION: This exam was performed according to the departmental dose-optimization program which includes automated exposure control, adjustment of the mA and/or kV according to patient size and/or use of iterative reconstruction technique. COMPARISON:  Neck CT 04/21/2023.   PET-CT 01/28/2021. FINDINGS: Brain: No midline shift, ventriculomegaly, mass effect, evidence of mass lesion, intracranial hemorrhage or evidence of cortically based acute infarction. No cerebral edema identified. Mild for age periventricular white matter hypodensity with no convincing cortical encephalomalacia. Vascular: No suspicious intracranial vascular hyperdensity. Calcified atherosclerosis at the skull base. Skull: Mildly heterogeneous calvarium bone mineralization (series 13, image 33) with  lytic lesions noted on the comparison neck CT last month. And the left mandible condyle is permeated. No skull fracture identified. Sinuses/Orbits: Mild sinus and mastoid fluid in the setting of intubation. Other: No acute orbit or scalp soft tissue finding identified. IMPRESSION: 1. No acute intracranial abnormality or acute traumatic injury identified. 2. Osseous metastatic disease, including of the left mandible. Electronically Signed   By: Odessa Fleming M.D.   On: 05/10/2023 11:24   DG Pelvis Portable  Result Date: 05/10/2023 CLINICAL DATA:  Fall. EXAM: PORTABLE PELVIS 1-2 VIEWS COMPARISON:  CT abdomen pelvis dated April 21, 2023. FINDINGS: No acute fracture or dislocation. Prior gamma nail fixation of the left proximal femur. Scattered small sclerotic lesions throughout the bony pelvis again noted. Mild bilateral hip degenerative changes. Soft tissues are  unremarkable. IMPRESSION: 1. No acute osseous abnormality. 2. Unchanged sclerotic metastatic disease. Electronically Signed   By: Obie Dredge M.D.   On: 05/10/2023 11:18   DG Chest Portable 1 View  Result Date: 05/10/2023 CLINICAL DATA:  Found down.  History of metastatic thyroid cancer. EXAM: PORTABLE CHEST 1 VIEW COMPARISON:  CT chest dated April 21, 2023. Chest x-ray dated February 14, 2023. FINDINGS: The patient is rotated to the left. Endotracheal tube tip is 2.5 cm above the carina. Enteric tube entering the stomach with the tip below the field of view. Chronic cardiomegaly. Diffuse interstitial thickening and hazy airspace opacities with layering bilateral pleural effusions. Retrocardiac left lower lobe atelectasis versus consolidation. No pneumothorax. No acute osseous abnormality. IMPRESSION: 1. Endotracheal tube tip 2.5 cm above the carina. 2. Pulmonary edema versus aspiration with layering bilateral pleural effusions. Electronically Signed   By: Obie Dredge M.D.   On: 05/10/2023 11:16    Pending Labs Unresulted Labs (From admission, onward)     Start     Ordered   05/11/23 0500  CBC  Tomorrow morning,   R        05/10/23 1257   05/11/23 0500  Basic metabolic panel  Tomorrow morning,   R        05/10/23 1257   05/11/23 0500  Magnesium  Tomorrow morning,   R        05/10/23 1257   05/11/23 0500  Phosphorus  Tomorrow morning,   R        05/10/23 1257   05/10/23 1700  Basic metabolic panel  Once,   R        05/10/23 1257   05/10/23 1258  Culture, Respiratory w Gram Stain  Once,   R        05/10/23 1259   05/10/23 1257  Procalcitonin  Add-on,   AD       References:    Procalcitonin Lower Respiratory Tract Infection AND Sepsis Procalcitonin Algorithm   05/10/23 1257   05/10/23 1006  Urine Culture  Once,   R        05/10/23 1006   05/10/23 0935  Culture, blood (routine x 2)  BLOOD CULTURE X 2,   R (with STAT occurrences)      05/10/23 0934            Vitals/Pain Today's  Vitals   05/10/23 1145 05/10/23 1200 05/10/23 1215 05/10/23 1230  BP: 123/84 130/79 131/81 125/78  Pulse: 77 80 85 85  Resp: 16 16 16 16   Temp: (!) 90.2 F (32.3 C) (!) 90.6 F (32.6 C) (!) 91 F (32.8 C) (!) 91.5 F (33.1 C)  TempSrc:  SpO2: 97% 95% 93% 96%  Weight:      Height:      PainSc:        Isolation Precautions No active isolations  Medications Medications  etomidate (AMIDATE) 2 MG/ML injection (  Not Given 05/10/23 0944)  rocuronium bromide 100 MG/10ML SOSY (  Not Given 05/10/23 1012)  rocuronium bromide 10 mg/mL (PF) syringe (has no administration in time range)  etomidate (AMIDATE) injection 20 mg (has no administration in time range)  docusate sodium (COLACE) capsule 100 mg (has no administration in time range)  polyethylene glycol (MIRALAX / GLYCOLAX) packet 17 g (has no administration in time range)  heparin injection 5,000 Units (has no administration in time range)  famotidine (PEPCID) tablet 20 mg (has no administration in time range)  ondansetron (ZOFRAN) injection 4 mg (has no administration in time range)  acetaminophen (TYLENOL) tablet 650 mg (has no administration in time range)  furosemide (LASIX) injection 40 mg (has no administration in time range)  etomidate (AMIDATE) injection (20 mg Intravenous Given 05/10/23 0944)  rocuronium (ZEMURON) injection (100 mg Intravenous Given 05/10/23 0945)  PHENYLephrine 80 mcg/ml in normal saline Adult IV Push Syringe (For Blood Pressure Support) (120 mcg Intravenous Given by Other 05/10/23 0956)  sodium chloride 0.9 % bolus 500 mL (0 mLs Intravenous Stopped 05/10/23 1029)  piperacillin-tazobactam (ZOSYN) IVPB 3.375 g (0 g Intravenous Stopped 05/10/23 1126)  albuterol (PROVENTIL) (2.5 MG/3ML) 0.083% nebulizer solution 10 mg (10 mg Nebulization Given 05/10/23 1125)  calcium gluconate inj 10% (1 g) URGENT USE ONLY! (1 g Intravenous Given 05/10/23 1145)    Mobility non-ambulatory

## 2023-05-10 NOTE — Progress Notes (Signed)
Pharmacy Antibiotic Note  Shannon [name] is a 82 y.o. female admitted on 05/10/2023 with sepsis.  Pharmacy has been consulted for Zosyn dosing.  Plan: Zosyn 3.375g IV q8h (4 hour infusion).  Height: 5\' 4"  (162.6 cm) Weight: 32 kg (70 lb 8.8 oz) IBW/kg (Calculated) : 54.7  Temp (24hrs), Avg:89.7 F (32.1 C), Min:85.7 F (29.8 C), Max:91.5 F (33.1 C)  Recent Labs  Lab 05/10/23 0945 05/10/23 1115  WBC 19.5*  --   CREATININE 0.83 0.72  LATICACIDVEN 1.8 1.9    Estimated Creatinine Clearance: 27.4 mL/min (by C-G formula based on SCr of 0.72 mg/dL).    Allergies  Allergen Reactions   Codeine Other (See Comments)    hyper Other reaction(s): insomnia    Antimicrobials this admission: 7/10 Zosyn >>    Dose adjustments this admission:   Microbiology results: 7/10 BCx:  7/10 UCx:      Thank you for allowing pharmacy to be a part of this patient's care.  Adalberto Cole, PharmD, BCPS 05/10/2023 1:18 PM

## 2023-05-10 NOTE — ED Provider Notes (Signed)
[facility]-ICU/STEPDOWN Provider Note   CSN: [id number] Arrival date & time: 05/10/23  7829     History  Chief Complaint  Patient presents with   Altered Mental Status    Shannon [name] is a 82 y.o. female.   Altered Mental Status Patient with metastatic cancer.  Found unresponsive this morning.  Laying face down.  GCS of 3 for EMS but otherwise vital signs reassuring but hypoxic.  Assisting respirations.  Upon arrival minimally responsive.  However he does have sats that are in the 70s.  Bagged back up eventually into the 90s.  Discussed with the patient's son who is also power of attorney. Patient is a DNR but would want intubation.  He also called other family members in [place].  Decision was made to intubate.   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 Authorizing Provider  acetaminophen (TYLENOL) 500 MG tablet Take 500 mg by mouth 3 (three) times daily.   Yes [provider]  albuterol (VENTOLIN HFA) 108 (90 Base) MCG/ACT inhaler Inhale 1-2 puffs into the lungs every 6 (six) hours as needed for wheezing or shortness of breath. 06/16/22  Yes [provider]  ALPRAZolam Prudy Feeler) 1 MG tablet Take 0.5 tablets (0.5 mg total) by mouth 2 (two) times daily. 02/20/23  Yes Azucena Fallen, MD  bacitracin ophthalmic ointment Place 1 Application into both eyes 3 (three) times daily. 08/15/20  Yes [provider]  carvedilol (COREG) 3.125 MG tablet Take 1 tablet (3.125 mg total) by mouth 2 (two) times daily with a meal. 02/20/23  Yes Azucena Fallen, MD   cetirizine (ZYRTEC) 10 MG tablet Take 10 mg by mouth daily.   Yes [provider]  Cholecalciferol (VITAMIN D) 1000 UNITS capsule Take 1,000 Units by mouth daily.   Yes [provider]  diphenhydramine-acetaminophen (TYLENOL PM) 25-500 MG TABS tablet Take 1 tablet by mouth at bedtime as needed (Sleep/Pain).   Yes [provider]  fluticasone (FLONASE) 50 MCG/ACT nasal spray Place 1 spray into both nostrils daily. 02/20/23  Yes Azucena Fallen, MD  Glucosamine-Chondroitin 250-200 MG CAPS Take 1 tablet by mouth daily. 12/23/10  Yes [provider]  glycopyrrolate (ROBINUL) 1 MG tablet Place 1 mg into feeding tube in the morning and at bedtime. 04/06/23  Yes [provider]  Infant Care Products [facility] EX) Apply 1 Application topically See admin instructions. Apply to buttocks with each incontinent episode.   Yes [provider]  latanoprost (XALATAN) 0.005 % ophthalmic solution Place 1 drop into both eyes at bedtime. 01/18/23  Yes [provider]  levothyroxine (SYNTHROID) 137 MCG tablet Take 137 mcg by mouth daily before breakfast.   Yes [provider]  liver oil-zinc oxide (DESITIN) 40 % ointment Apply topically as needed for irritation. 02/20/23  Yes Azucena Fallen, MD  loperamide (IMODIUM A-D) 2 MG tablet Take 2 mg by mouth 3 (three) times daily.   Yes [provider]  Multiple Vitamins-Minerals (CEROVITE SENIOR PO) Take 1 tablet by mouth daily.  Yes [provider]  Nutritional Supplements (FEEDING SUPPLEMENT, OSMOLITE 1.5 CAL,) LIQD Osmolite 1.5 via J tube at 120 mL/hr for 14 hours from 6 pm to 8 am. Flush with 200 mL water 6 times daily including before and after continuous feeding. Provides 2485 cal, 104 gm, 2467 mL free water/100% estimated needs. SEND PUMP ASAP. 03/13/23   Johney Maine, MD  nystatin (MYCOSTATIN/NYSTOP) powder Apply 1 Application topically in the morning and at bedtime.  Apply to groin and vaginal area twice a day. 02/21/23  Yes [provider]  ondansetron (ZOFRAN) 8 MG tablet TAKE 1 TABLET BY MOUTH EVERY 8 HOURS AS NEEDED FOR NAUSEA OR VOMITING 05/08/23  Yes Johney Maine, MD  pantoprazole (PROTONIX) 2 mg/mL suspension Place 40 mg into feeding tube 2 (two) times daily.   Yes [provider]  sucralfate (CARAFATE) 1 GM/10ML suspension Place 1 g into feeding tube 4 (four) times daily. 02/28/23  Yes [provider]  Vitamin D, Ergocalciferol, (DRISDOL) 1.25 MG (50000 UNIT) CAPS capsule Take 1 capsule by mouth once a week. 03/21/23 03/20/24 Yes [provider]  vitamin E 180 MG (400 UNITS) capsule Take 400 Units by mouth daily.   Yes [provider]      Allergies    Codeine    Review of Systems   Review of Systems  Physical Exam Updated Vital Signs BP (!) 141/80   Pulse 98   Temp (!) 95.5 F (35.3 C)   Resp 16   Ht 5\' 4"  (1.626 m)   Wt 32 kg   SpO2 97%   BMI 12.11 kg/m  Physical Exam Vitals and nursing note reviewed.  HENT:     Head: Atraumatic.  Cardiovascular:     Rate and Rhythm: Regular rhythm.  Pulmonary:     Breath sounds: No wheezing or rhonchi.  Neurological:     Comments:   Unresponsive.  Breathing spontaneously but no response to pain.     ED Results / Procedures / Treatments   Labs (all labs ordered are listed, but only abnormal results are displayed) Labs Reviewed  COMPREHENSIVE METABOLIC PANEL - Abnormal; Notable for the following components:      Result Value   Potassium 6.5 (*)    Glucose, Bld 195 (*)    BUN 46 (*)    Calcium 8.3 (*)    Albumin 3.2 (*)    AST 74 (*)    ALT 65 (*)    All other components within normal limits  CBC WITH DIFFERENTIAL/PLATELET - Abnormal; Notable for the following components:   WBC 19.5 (*)    MCH 25.1 (*)    MCHC 28.7 (*)    RDW 16.3 (*)    Neutro Abs 16.4 (*)    Monocytes Absolute 2.1 (*)    Abs Immature Granulocytes 0.19 (*)    All  other components within normal limits  URINALYSIS, W/ REFLEX TO CULTURE (INFECTION SUSPECTED) - Abnormal; Notable for the following components:   APPearance CLOUDY (*)    Protein, ur 30 (*)    Leukocytes,Ua LARGE (*)    Bacteria, UA RARE (*)    All other components within normal limits  BLOOD GAS, ARTERIAL - Abnormal; Notable for the following components:   pH, Arterial 7.27 (*)    pCO2 arterial 52 (*)    pO2, Arterial 69 (*)    Acid-base deficit 3.5 (*)    All other components within normal limits  COMPREHENSIVE METABOLIC PANEL - Abnormal; Notable for  the following components:   Potassium 6.1 (*)    Glucose, Bld 158 (*)    BUN 43 (*)    Calcium 7.8 (*)    Total Protein 6.2 (*)    Albumin 2.8 (*)    AST 91 (*)    ALT 70 (*)    Total Bilirubin 1.3 (*)    All other components within normal limits  CBG MONITORING, ED - Abnormal; Notable for the following components:   Glucose-Capillary 165 (*)    All other components within normal limits  CULTURE, BLOOD (ROUTINE X 2)  CULTURE, BLOOD (ROUTINE X 2)  URINE CULTURE  CULTURE, RESPIRATORY W GRAM STAIN  MRSA NEXT GEN BY PCR, NASAL  LACTIC ACID, PLASMA  LACTIC ACID, PLASMA  CK  BASIC METABOLIC PANEL  PROCALCITONIN    EKG EKG Interpretation Date/Time:  Wednesday May 10 2023 10:07:53 EDT Ventricular Rate:  76 PR Interval:  154 QRS Duration:  104 QT Interval:  459 QTC Calculation: 517 R Axis:   65  Text Interpretation: duplicate Confirmed by Benjiman Core [phone]) on 05/10/2023 11:07:17 AM  Radiology CT Cervical Spine Wo Contrast  Result Date: 05/10/2023 CLINICAL DATA:  82 year old female found down at 0830 hours, last known well 2230 hours yesterday. History of recurrent medullary thyroid cancer. EXAM: CT CERVICAL SPINE WITHOUT CONTRAST TECHNIQUE: Multidetector CT imaging of the cervical spine was performed without intravenous contrast. Multiplanar CT image reconstructions were also generated. RADIATION DOSE REDUCTION:  This exam was performed according to the departmental dose-optimization program which includes automated exposure control, adjustment of the mA and/or kV according to patient size and/or use of iterative reconstruction technique. COMPARISON:  Neck CT last month.  Cervical spine CT 01/16/2023. FINDINGS: Alignment: Stable straightening of cervical lordosis since March. Cervicothoracic junction alignment is within normal limits. Bilateral posterior element alignment is within normal limits. Skull base and vertebrae: Heterogeneous sclerotic right occipital condyle and C1 ring metastases. But Visualized skull base is intact. No atlanto-occipital dissociation. C1 and C2 appear intact and aligned. Heterogeneous vertebral metastases throughout the cervical spine, more often sclerotic than lytic. But there is a lytic C7 body metastasis to the right of midline. No superimposed acute cervical spine fracture is identified. Soft tissues and spinal canal: No visible canal hematoma. Intubated and oral enteric tube. These course appropriately into the airway and cervical esophagus. Numerous bilateral neck surgical clips, with generalized new indistinct neck soft tissue swelling, subcutaneous edema. This might be sequelae of XRT, uncertain. But there is a small superimposed retropharyngeal effusion, also new from last month. Disc levels: Stable since March. No CT evidence of cervical spinal stenosis. Upper chest: Partially calcified confluent apical lung scarring again noted. Upper thoracic sclerotic metastases. Other findings: Permeated left mandible condyle metastasis. IMPRESSION: 1. No acute osseous injury identified in the cervical spine. Diffuse spine and bone metastases. 2. New from last month nonspecific generalized neck soft tissue thickening and inflammation. Associated small retropharyngeal effusion. Has there been interval XRT? 3. Underlying chronic postoperative changes to the bilateral neck. 4. Satisfactory visible ET  tube and enteric tube. Electronically Signed   By: Odessa Fleming M.D.   On: 05/10/2023 11:30   CT HEAD WO CONTRAST ( )  Result Date: 05/10/2023 CLINICAL DATA:  82 year old female found down at 0830 hours, last known well 2230 hours yesterday. History of recurrent medullary thyroid cancer. EXAM: CT HEAD WITHOUT CONTRAST TECHNIQUE: Contiguous axial images were obtained from the base of the skull through the vertex without intravenous contrast. RADIATION DOSE REDUCTION: This  exam was performed according to the departmental dose-optimization program which includes automated exposure control, adjustment of the mA and/or kV according to patient size and/or use of iterative reconstruction technique. COMPARISON:  Neck CT 04/21/2023.   PET-CT 01/28/2021. FINDINGS: Brain: No midline shift, ventriculomegaly, mass effect, evidence of mass lesion, intracranial hemorrhage or evidence of cortically based acute infarction. No cerebral edema identified. Mild for age periventricular white matter hypodensity with no convincing cortical encephalomalacia. Vascular: No suspicious intracranial vascular hyperdensity. Calcified atherosclerosis at the skull base. Skull: Mildly heterogeneous calvarium bone mineralization (series 13, image 33) with lytic lesions noted on the comparison neck CT last month. And the left mandible condyle is permeated. No skull fracture identified. Sinuses/Orbits: Mild sinus and mastoid fluid in the setting of intubation. Other: No acute orbit or scalp soft tissue finding identified. IMPRESSION: 1. No acute intracranial abnormality or acute traumatic injury identified. 2. Osseous metastatic disease, including of the left mandible. Electronically Signed   By: Odessa Fleming M.D.   On: 05/10/2023 11:24   DG Pelvis Portable  Result Date: 05/10/2023 CLINICAL DATA:  Fall. EXAM: PORTABLE PELVIS 1-2 VIEWS COMPARISON:  CT abdomen pelvis dated April 21, 2023. FINDINGS: No acute fracture or dislocation. Prior gamma nail  fixation of the left proximal femur. Scattered small sclerotic lesions throughout the bony pelvis again noted. Mild bilateral hip degenerative changes. Soft tissues are unremarkable. IMPRESSION: 1. No acute osseous abnormality. 2. Unchanged sclerotic metastatic disease. Electronically Signed   By: Obie Dredge M.D.   On: 05/10/2023 11:18   DG Chest Portable 1 View  Result Date: 05/10/2023 CLINICAL DATA:  Found down.  History of metastatic thyroid cancer. EXAM: PORTABLE CHEST 1 VIEW COMPARISON:  CT chest dated April 21, 2023. Chest x-ray dated February 14, 2023. FINDINGS: The patient is rotated to the left. Endotracheal tube tip is 2.5 cm above the carina. Enteric tube entering the stomach with the tip below the field of view. Chronic cardiomegaly. Diffuse interstitial thickening and hazy airspace opacities with layering bilateral pleural effusions. Retrocardiac left lower lobe atelectasis versus consolidation. No pneumothorax. No acute osseous abnormality. IMPRESSION: 1. Endotracheal tube tip 2.5 cm above the carina. 2. Pulmonary edema versus aspiration with layering bilateral pleural effusions. Electronically Signed   By: Obie Dredge M.D.   On: 05/10/2023 11:16    Procedures Date/Time: 05/10/2023 9:40 AM  Performed by: Benjiman Core, MDPre-anesthesia Checklist: Patient identified Oxygen Delivery Method: Ambu bag Preoxygenation: Pre-oxygenation with 100% oxygen Induction Type: Rapid sequence Ventilation: Mask ventilation without difficulty Laryngoscope Size: Glidescope and 3 Grade View: Grade II Tube size: 7.5 mm Number of attempts: 1 Placement Confirmation: ETT inserted through vocal cords under direct vision, Breath sounds checked- equal and bilateral and CO2 detector        Medications Ordered in ED Medications  etomidate (AMIDATE) 2 MG/ML injection (  Not Given 05/10/23 0944)  rocuronium bromide 100 MG/10ML SOSY (  Not Given 05/10/23 1012)  rocuronium bromide 10 mg/mL (PF)  syringe (has no administration in time range)  etomidate (AMIDATE) injection 20 mg (has no administration in time range)  docusate (COLACE) 50 MG/5ML liquid 100 mg (has no administration in time range)  polyethylene glycol (MIRALAX / GLYCOLAX) packet 17 g (has no administration in time range)  heparin injection 5,000 Units (0 Units Subcutaneous Hold 05/10/23 1429)  famotidine (PEPCID) tablet 20 mg (20 mg Per Tube Given 05/10/23 1428)  ondansetron (ZOFRAN) injection 4 mg (has no administration in time range)  acetaminophen (TYLENOL) 160 MG/5ML solution 650  mg (has no administration in time range)  piperacillin-tazobactam (ZOSYN) IVPB 3.375 g (has no administration in time range)  Oral care mouth rinse (15 mLs Mouth Rinse Given 05/10/23 1430)  Oral care mouth rinse (has no administration in time range)  Chlorhexidine Gluconate Cloth 2 % PADS 6 each (6 each Topical Given 05/10/23 1414)  docusate (COLACE) 50 MG/5ML liquid 100 mg (has no administration in time range)  polyethylene glycol (MIRALAX / GLYCOLAX) packet 17 g (has no administration in time range)  fentaNYL (SUBLIMAZE) injection 25 mcg (has no administration in time range)  fentaNYL (SUBLIMAZE) injection 25-100 mcg (has no administration in time range)  EPINEPHrine (ADRENALIN) 1 MG/10ML injection (has no administration in time range)  etomidate (AMIDATE) injection (20 mg Intravenous Given 05/10/23 0944)  rocuronium (ZEMURON) injection (100 mg Intravenous Given 05/10/23 0945)  PHENYLephrine 80 mcg/ml in normal saline Adult IV Push Syringe (For Blood Pressure Support) (120 mcg Intravenous Given by Other 05/10/23 0956)  sodium chloride 0.9 % bolus 500 mL (0 mLs Intravenous Stopped 05/10/23 1029)  piperacillin-tazobactam (ZOSYN) IVPB 3.375 g (0 g Intravenous Stopped 05/10/23 1126)  albuterol (PROVENTIL) (2.5 MG/3ML) 0.083% nebulizer solution 10 mg (10 mg Nebulization Given 05/10/23 1125)  calcium gluconate inj 10% (1 g) URGENT USE ONLY! (1 g  Intravenous Given 05/10/23 1145)  furosemide (LASIX) injection 40 mg (40 mg Intravenous Given 05/10/23 1429)    ED Course/ Medical Decision Making/ A&P                             Medical Decision Making Amount and/or Complexity of Data Reviewed Labs: ordered. Radiology: ordered.  Risk Prescription drug management. Decision regarding hospitalization.   Patient unresponsive.  Differential diagnosis includes intracranial hemorrhage or other traumatic cause.  Infection also considered.  Was hypothermic.  Antibiotics given.  Has DNR but wanted intubation.  Intubated by me without difficulty.  Did have hypotension after but improved with fluid and phenylephrine bolus.  Discussed with ICU who will see patient.  X-ray shows effusion versus potential pneumonia.  Zosyn has been given.  Urinalysis did show potential infection.   Also found to hypokalemic.  Initial EKG reassuring.  Good renal function and reportedly has had some diarrhea.  Repeated and it was elevated.  Discussed with Dr. Vassie Loll from ICU who will admit patient.    CRITICAL CARE Performed by: Benjiman Core Total critical care time: 30 minutes Critical care time was exclusive of separately billable procedures and treating other patients. Critical care was necessary to treat or prevent imminent or life-threatening deterioration. Critical care was time spent personally by me on the following activities: development of treatment plan with patient and/or surrogate as well as nursing, discussions with consultants, evaluation of patient's response to treatment, examination of patient, obtaining history from patient or surrogate, ordering and performing treatments and interventions, ordering and review of laboratory studies, ordering and review of radiographic studies, pulse oximetry and re-evaluation of patient's condition.         Final Clinical Impression(s) / ED Diagnoses Final diagnoses:  Encephalopathy  Hyperkalemia  Acute  respiratory failure with hypoxia [facility])    Rx / DC Orders ED Discharge Orders     None         Benjiman Core, MD 05/10/23 1455

## 2023-05-11 ENCOUNTER — Inpatient Hospital Stay (HOSPITAL_COMMUNITY): Payer: Medicare Other

## 2023-05-11 ENCOUNTER — Encounter: Payer: Self-pay | Admitting: Hematology

## 2023-05-11 DIAGNOSIS — G9340 Encephalopathy, unspecified: Secondary | ICD-10-CM | POA: Diagnosis not present

## 2023-05-11 DIAGNOSIS — M7989 Other specified soft tissue disorders: Secondary | ICD-10-CM | POA: Diagnosis not present

## 2023-05-11 DIAGNOSIS — T17908A Unspecified foreign body in respiratory tract, part unspecified causing other injury, initial encounter: Secondary | ICD-10-CM | POA: Diagnosis not present

## 2023-05-11 DIAGNOSIS — J9601 Acute respiratory failure with hypoxia: Secondary | ICD-10-CM | POA: Diagnosis not present

## 2023-05-11 LAB — MAGNESIUM
Magnesium: 2.3 mg/dL (ref 1.7–2.4)
Magnesium: 2.1 mg/dL (ref 1.7–2.4)

## 2023-05-11 LAB — BASIC METABOLIC PANEL
Anion gap: 13 (ref 5–15)
BUN: 36 mg/dL — ABNORMAL HIGH (ref 8–23)
CO2: 23 mmol/L (ref 22–32)
Calcium: 8.1 mg/dL — ABNORMAL LOW (ref 8.9–10.3)
Chloride: 100 mmol/L (ref 98–111)
Creatinine, Ser: 0.84 mg/dL (ref 0.44–1.00)
GFR, Estimated: >60 mL/min (ref >60)
Glucose, Bld: 158 mg/dL — ABNORMAL HIGH (ref 70–99)
Potassium: 4.7 mmol/L (ref 3.5–5.1)
Sodium: 136 mmol/L (ref 135–145)

## 2023-05-11 LAB — PHOSPHORUS
Phosphorus: 4.7 mg/dL — ABNORMAL HIGH (ref 2.5–4.6)
Phosphorus: 4 mg/dL (ref 2.5–4.6)

## 2023-05-11 LAB — CBC
HCT: 39.7 % (ref 36.0–46.0)
Hemoglobin: 11.8 g/dL — ABNORMAL LOW (ref 12.0–15.0)
MCH: 24.9 pg — ABNORMAL LOW (ref 26.0–34.0)
MCHC: 29.7 g/dL — ABNORMAL LOW (ref 30.0–36.0)
MCV: 83.8 fL (ref 80.0–100.0)
Platelets: 304 10*3/uL (ref 150–400)
RBC: 4.74 MIL/uL (ref 3.87–5.11)
RDW: 16.5 % — ABNORMAL HIGH (ref 11.5–15.5)
WBC: 18.1 10*3/uL — ABNORMAL HIGH (ref 4.0–10.5)
nRBC: 0 % (ref 0.0–0.2)

## 2023-05-11 LAB — GLUCOSE, CAPILLARY
Glucose-Capillary: 141 mg/dL — ABNORMAL HIGH (ref 70–99)
Glucose-Capillary: 135 mg/dL — ABNORMAL HIGH (ref 70–99)
Glucose-Capillary: 128 mg/dL — ABNORMAL HIGH (ref 70–99)
Glucose-Capillary: 160 mg/dL — ABNORMAL HIGH (ref 70–99)
Glucose-Capillary: 174 mg/dL — ABNORMAL HIGH (ref 70–99)

## 2023-05-11 MED ORDER — MIDAZOLAM HCL 2 MG/2ML IJ SOLN
1.0000 mg | INTRAMUSCULAR | Status: DC | PRN
  Administered 2023-05-11 (×2): 2 mg via INTRAVENOUS
  Administered 2023-05-12: 1 mg via INTRAVENOUS
  Administered 2023-05-12 (×2): 2 mg via INTRAVENOUS
  Filled 2023-05-11 (×5): qty 2

## 2023-05-11 MED ORDER — MIDAZOLAM HCL 2 MG/2ML IJ SOLN: 2.0000 mg | Freq: Once | INTRAMUSCULAR | Status: AC

## 2023-05-11 MED ORDER — FENTANYL BOLUS VIA INFUSION
25.0000 ug | INTRAVENOUS | Status: DC | PRN
  Administered 2023-05-11: 100 ug via INTRAVENOUS
  Administered 2023-05-11: 75 ug via INTRAVENOUS
  Administered 2023-05-11 (×3): 100 ug via INTRAVENOUS
  Administered 2023-05-11: 75 ug via INTRAVENOUS
  Administered 2023-05-12: 100 ug via INTRAVENOUS
  Administered 2023-05-12: 50 ug via INTRAVENOUS
  Administered 2023-05-12 (×3): 100 ug via INTRAVENOUS

## 2023-05-11 MED ORDER — FENTANYL 2500MCG IN NS 250ML (10MCG/ML) PREMIX INFUSION
0.0000 ug/h | INTRAVENOUS | Status: DC
  Administered 2023-05-11: 25 ug/h via INTRAVENOUS
  Administered 2023-05-12: 150 ug/h via INTRAVENOUS
  Filled 2023-05-11 (×2): qty 250

## 2023-05-11 MED ORDER — MIDAZOLAM HCL 2 MG/2ML IJ SOLN
2.0000 mg | INTRAMUSCULAR | Status: DC | PRN
  Administered 2023-05-11 (×3): 2 mg via INTRAVENOUS
  Filled 2023-05-11 (×3): qty 2

## 2023-05-11 MED ORDER — OSMOLITE 1.5 CAL PO LIQD
1000.0000 mL | ORAL | Status: DC
  Administered 2023-05-11: 1000 mL
  Filled 2023-05-11 (×2): qty 1000

## 2023-05-11 MED ORDER — AMIODARONE HCL IN DEXTROSE 360-4.14 MG/200ML-% IV SOLN
30.0000 mg/h | INTRAVENOUS | Status: DC
  Administered 2023-05-12 (×2): 30 mg/h via INTRAVENOUS
  Filled 2023-05-11 (×2): qty 200

## 2023-05-11 MED ORDER — ALPRAZOLAM 0.5 MG PO TABS
0.5000 mg | ORAL_TABLET | Freq: Two times a day (BID) | ORAL | Status: DC
  Administered 2023-05-11 – 2023-05-12 (×3): 0.5 mg
  Filled 2023-05-11 (×3): qty 1

## 2023-05-11 MED ORDER — MIDAZOLAM-SODIUM CHLORIDE 100-0.9 MG/100ML-% IV SOLN
0.0000 mg/h | INTRAVENOUS | Status: DC
  Administered 2023-05-11: 2 mg/h via INTRAVENOUS
  Filled 2023-05-11: qty 100

## 2023-05-11 MED ORDER — PROSOURCE TF20 ENFIT COMPATIBL EN LIQD
60.0000 mL | Freq: Every day | ENTERAL | Status: DC
  Administered 2023-05-11: 60 mL
  Filled 2023-05-11: qty 60

## 2023-05-11 MED ORDER — MIDAZOLAM BOLUS VIA INFUSION
0.0000 mg | INTRAVENOUS | Status: DC | PRN
  Administered 2023-05-11 (×2): 2 mg via INTRAVENOUS

## 2023-05-11 MED ORDER — AMIODARONE HCL IN DEXTROSE 360-4.14 MG/200ML-% IV SOLN
60.0000 mg/h | INTRAVENOUS | Status: AC
  Administered 2023-05-11 (×2): 60 mg/h via INTRAVENOUS
  Filled 2023-05-11: qty 200

## 2023-05-11 MED ORDER — MIDAZOLAM HCL 2 MG/2ML IJ SOLN
2.0000 mg | Freq: Once | INTRAMUSCULAR | Status: AC
  Administered 2023-05-11: 2 mg via INTRAVENOUS

## 2023-05-11 MED ORDER — MIDAZOLAM HCL 2 MG/2ML IJ SOLN
INTRAMUSCULAR | Status: AC
  Filled 2023-05-11: qty 2

## 2023-05-11 MED ORDER — FENTANYL CITRATE PF 50 MCG/ML IJ SOSY
25.0000 ug | PREFILLED_SYRINGE | Freq: Once | INTRAMUSCULAR | Status: AC
  Administered 2023-05-11: 25 ug via INTRAVENOUS

## 2023-05-11 MED ORDER — FUROSEMIDE 10 MG/ML IJ SOLN
20.0000 mg | Freq: Once | INTRAMUSCULAR | Status: AC
  Administered 2023-05-11: 20 mg via INTRAVENOUS
  Filled 2023-05-11: qty 2

## 2023-05-11 NOTE — Progress Notes (Signed)
Initial Nutrition Assessment  DOCUMENTATION CODES:   Severe malnutrition in context of chronic illness  INTERVENTION:  - Per CCM MD, can start tube feeds today.  Osmolite 1.5 at 50 ml/h (1200 ml per day) *Start at 56mL/hr and advance by 10mL Q4H Prosource TF20 60 ml daily Provides 1880 kcal, 95 gm protein, 914 ml free water daily  - Monitor magnesium, potassium, and phosphorus BID for at least 3 days, MD to replete as needed.  - FWF per CCM/MD.  - Monitor weight trends.   - Will monitor for further GOC discussions.   NUTRITION DIAGNOSIS:   Severe Malnutrition related to chronic illness as evidenced by severe fat depletion, severe muscle depletion.  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  7/10 Admit, Intubated  Patient intubated and sedated at time of visit. Several family members at bedside. Family members called daughter-in-law Waynetta Sandy, who provided most of history.   Recent UBW reported to be 116# (as of June 27th). Family reports she has slowly been gaining weight over the past few weeks to months.  Per EMR, weight stable around 90-95# the past year until April when patient was weighed at 68#. Weighed back up at 105# in May. Weights this admission at 70# then 125#, suspect 125# may be more accurate given report of weight gain.   Beth reports patient's home TF regimen as below: [employer] 1.4 at 143mL/hr for 12 hours + free water flushes 5x/day ( ) This provides 2100 kcals, 92g protein, and free water from formula (total  1616mL/day)  Beth reports patient has been tolerating regimen well but notes her chronic diarrhea remains ongoing. She feels the [employer] made this worse than the Osmolite and would like to change back to Osmolite.  Of note, patient follows with outpatient cancer center dietitians. Per chart review, this issues was reported in May to outpatient RD who adjusted orders back to Osmolite. However, it appears the formula did not change at home.  OGT in place, to LIS at this time. Per MD read of xray "enteric tube entering the stomach with the tip below the field of view".    Per discussion with CCM MD Dr. Vassie Loll, can start tube feeds today. Will plan to start on Osmolite 1.5 at this time and start with 24 hour continuous feeds.  Goals of care discussions remain ongoing. Per CCM note today, awaiting arrival of more family members to discuss possible transition to more comfort approach.   Medications reviewed and include: Levophed  Labs reviewed:  -   NUTRITION - FOCUSED PHYSICAL EXAM:  Flowsheet Row Most Recent Value  Orbital Region Severe depletion  Upper Arm Region Moderate depletion  Thoracic and Lumbar Region Moderate depletion  Buccal Region Severe depletion  Temple Region Severe depletion  Clavicle Bone Region Severe depletion  Clavicle and Acromion Bone Region Severe depletion  Scapular Bone Region Unable to assess  Dorsal Hand Severe depletion  Patellar Region Mild depletion  Anterior Thigh Region Mild depletion  Posterior Calf Region Mild depletion  Edema (RD Assessment) Mild  Hair Reviewed  Eyes Unable to assess  Mouth Unable to assess  Skin Reviewed  Nails Reviewed       Diet Order:   Diet Order             Diet NPO time specified  Diet effective now                   EDUCATION NEEDS:  Education needs have been addressed  Skin:  Skin Assessment: Skin Integrity Issues: Skin Integrity Issues:: Other (Comment) Other: Irritant Dermatitis (Moisture  Associated Skin Damage) on Buttocks  Last BM:  7/10  Height:  Ht Readings from Last 1 Encounters:  05/10/23 5\' 4"  (1.626 m)   Weight: Wt Readings from Last 1 Encounters:  05/11/23 57 kg    BMI:  Body mass index is 21.57 kg/m.  Estimated Nutritional Needs:  Kcal:  1600-1850 kcals Protein:  85-100 grams Fluid:  >/= 1.6L    Shelle Iron RD, LDN For contact information, refer to [facility].

## 2023-05-11 NOTE — Progress Notes (Signed)
PCCM Progress note   Notified by nursing of rhythm change with associated cardiac pauses upwards of 1.7 current x 2.  Chart reviewed and patient has known history of paroxysmal atrial fibrillation.  Twelve-lead EKG obtained-and confirmed A-fib RVR.  Will start amiodarone drip.   Both sons updated regarding change in clinical status, family wishes to continue with current interventions.  Janiaya Ryser D. Harris, NP-C [facility] Personal contact information can be found on Amion  If no contact or response made please call [phone] 05/11/2023, 4:56 PM

## 2023-05-11 NOTE — Progress Notes (Signed)
Bilateral lower extremity venous study completed.   Preliminary results relayed to MD and RN.  Please see CV Procedures for preliminary results.  Monice Lundy, RVT  9:39 AM 05/11/23

## 2023-05-11 NOTE — Plan of Care (Signed)

## 2023-05-11 NOTE — TOC Progression Note (Signed)
Transition of Care [facility]) - Progression Note    Patient Details  Name: Shannon [name] MRN: [id number] Date of Birth: October 29, 1941  Transition of Care [facility]) CM/SW Contact  Coralyn Helling, [place] Phone Number: 05/11/2023, 9:46 AM  Clinical Narrative:   Transition of Care (TOC) - Inpatient Brief Assessment  TOC will follow for needs. Patient dc to SNF in April.     Patient Details  Name: Shannon [name] MRN: [id number] Date of Birth: Mar 05, 1941  Transition of Care [facility]) CM/SW Contact:    Coralyn Helling, LCSW Phone Number: 05/11/2023, 9:46 AM   Clinical Narrative:    Transition of Care Asessment: Insurance and Status: Insurance coverage has been reviewed Patient has primary care physician: Yes Home environment has been reviewed: Yes Prior level of function:: Uses walker has feeding tube Prior/Current Home Services: No current home services Social Determinants of Health Reivew: SDOH reviewed no interventions necessary Readmission risk has been reviewed: Yes Transition of care needs: transition of care needs identified, TOC will continue to follow          Expected Discharge Plan and Services                                               Social Determinants of Health (SDOH) Interventions SDOH Screenings   Food Insecurity: No Food Insecurity (02/12/2023)  Housing: Low Risk  (02/12/2023)  Transportation Needs: No Transportation Needs (02/12/2023)  Utilities: Not At Risk (02/12/2023)  Tobacco Use: Medium Risk (05/10/2023)    Readmission Risk Interventions    05/11/2023    9:44 AM  Readmission Risk Prevention Plan  Transportation Screening Complete  HRI or Home Care Consult Complete  SW Recovery Care/Counseling Consult Complete

## 2023-05-11 NOTE — Progress Notes (Signed)
NAME:  Shannon [name], MRN:  [id number], DOB:  July 28, 1941, LOS: 1 ADMISSION DATE:  05/10/2023, CONSULTATION DATE:  05/10/2023 REFERRING MD:  Dr. 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  Last seen normal around 10:30 on 7/9 found a.m. 7/10 around 8:30.  EMS assisted ventilations and route to ED.  On ED arrival patient was intubated with etomidate and roc.  Vital signs on arrival significant for significant hypothermia with rectal temperature 85.7 and hypotension.  Lab work significant for K6.5, glucose 195, BUN 46, calcium 8.3, albumin 3.2, AST 74, ALT 65, WBC 19.5, lactic within normal at 1.8, CK also normal at 75.  UA with rare bacteria negative nitrates and large leukocytes.  Chest x-ray with likely aspiration and underlying pulmonary edema.  Head CT negative for acute intracranial abnormalities and osseous metastatic disease including left mandible.  CT spine with no acute abnormalities, diffuse osseous metastatic disease seen.  Given need for advanced airway PCCM consulted for further management and admission.  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  During episodes of agitation/wakefulness patient indicates she would not like to remain intubated.  Interim History / Subjective:  Sedated on 1 L this morning 90% FiO2 on  vent  Objective   Blood pressure 110/61, pulse 91, temperature 100 F (37.8 C), resp. rate (!) 21, height 5\' 4"  (1.626 m), weight 57 kg, SpO2 96%.    Vent Mode: PRVC FiO2 (%):  [90 %-100 %] 90 % Set Rate:  [16 bmp-20 bmp] 16 bmp Vt Set:  [430 mL-435 mL] 430 mL PEEP:  [5 cmH20] 5 cmH20 Plateau Pressure:  [16 cmH20-26 cmH20] 19 cmH20   Intake/Output Summary (Last 24 hours) at 05/11/2023 0747 Last data filed at 05/11/2023 4098 Gross per 24 hour  Intake 684.61 ml  Output 2550 ml  Net -1865.39 ml   Filed Weights   05/10/23 0937 05/10/23 1530 05/11/23 0500  Weight: 32 kg 57.2 kg 57 kg    Examination: General: Acute on chronic deconditioned frail elderly female lying in bed on mechanical ventilation in no acute distress HEENT: ETT, MM pink/moist, PERRL,  Neuro: Dated on ventilator, opens eyes to verbal and physical stimuli CV: s1s2 regular rate and rhythm, no murmur, rubs, or gallops,  PULM: Clear to auscultation bilaterally, no increased work of breathing, no added breath sounds GI: soft, bowel sounds active in all 4 quadrants, non-tender, non-distended Extremities: warm/dry, no edema  Skin: no rashes or lesions   Resolved Hospital Problem list   Hyperkalemia  Assessment & Plan:  Acute Hypoxic and Hypercapnic Respiratory Failure  Aspiration pneumonia -In the setting of aspiration pneumonia and likely underlying pulmonary edema -CTA chest negative for acute PE P: Continue ventilator support with lung protective strategies with plans to further discuss goals of care, see below Wean PEEP and FiO2 for sats greater than 90%. Head of bed elevated 30 degrees. Plateau pressures less  than 30 cm H20.  Follow intermittent chest x-ray and ABG.   SAT/SBT as tolerated, mentation preclude extubation  Ensure adequate pulmonary hygiene  Follow cultures  VAP bundle in place  PAD protocol Continue empiric Zosyn  Sepsis  -hypothermia and leukocytosis raises the question of sepsis likely  due to aspiration pneumonia or UTI -UA with rare bacteria, large leukocytes and negative nitrates P: Continue empiric Zosyn Vent support as above Follow cultures  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  Hypothyroidism s/p total thyroidectomy January 2011 with bilateral neck dissection P: Outpatient follow-up Supportive care  Paroxysmal atrial fibrillation -Per PCP note in care everywhere patient experienced intermittent episodes of atrial fibrillation during ICU stay for pneumonia May of this year  P: Continuous telemetry Resume home beta-blocker when appropriate  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  Spoke with patient's daughter-in-law at bedside this a.m. she states that once her husband, patient's son, arrives would family like to further discuss plan of care with possible transition to more of a comfort approach   Best Practice (right click and "Reselect all SmartList Selections" daily)   Diet/type: tubefeeds DVT prophylaxis: LMWH GI prophylaxis: PPI Lines: N/A Foley:  Yes, and it is still needed Code Status:  full code Last date of multidisciplinary goals of care discussion discussed with son Shannon [name] who used to work for [employer].  Out of facility DNR reassured.  He requested intubation but no CPR no cardioversion, if does not improve within the next 48 hours we will proceed with full comfort.  He is also waiting for his brother to return from [place]  Critical care time:   CRITICAL CARE Performed by: Kyesha Balla D. Harris   Total critical care time: 38 minutes  Critical care time was exclusive of separately billable  procedures and treating other patients.  Critical care was necessary to treat or prevent imminent or life-threatening deterioration.  Critical care was time spent personally by me on the following activities: development of treatment plan with patient and/or surrogate as well as nursing, discussions with consultants, evaluation of patient's response to treatment, examination of patient, obtaining history from patient or surrogate, ordering and performing treatments and interventions, ordering and review of laboratory studies, ordering and review of radiographic studies, pulse oximetry and re-evaluation of patient's condition.  Camil Wilhelmsen D. Harris, NP-C [facility] Personal contact information can be found on Amion  If no contact or response made please call [phone] 05/11/2023, 7:53 AM

## 2023-05-12 ENCOUNTER — Inpatient Hospital Stay (HOSPITAL_COMMUNITY): Payer: Medicare Other

## 2023-05-12 ENCOUNTER — Other Ambulatory Visit: Payer: Self-pay

## 2023-05-12 DIAGNOSIS — A419 Sepsis, unspecified organism: Secondary | ICD-10-CM

## 2023-05-12 DIAGNOSIS — R6521 Severe sepsis with septic shock: Secondary | ICD-10-CM

## 2023-05-12 DIAGNOSIS — J9601 Acute respiratory failure with hypoxia: Secondary | ICD-10-CM | POA: Diagnosis not present

## 2023-05-12 DIAGNOSIS — J90 Pleural effusion, not elsewhere classified: Secondary | ICD-10-CM

## 2023-05-12 DIAGNOSIS — T17908A Unspecified foreign body in respiratory tract, part unspecified causing other injury, initial encounter: Secondary | ICD-10-CM | POA: Diagnosis not present

## 2023-05-12 LAB — CBC WITH DIFFERENTIAL/PLATELET
Abs Immature Granulocytes: 0.07 10*3/uL (ref 0.00–0.07)
Basophils Absolute: 0.1 10*3/uL (ref 0.0–0.1)
Basophils Relative: 0 %
Eosinophils Absolute: 0 10*3/uL (ref 0.0–0.5)
Eosinophils Relative: 0 %
HCT: 41.5 % (ref 36.0–46.0)
Hemoglobin: 11.5 g/dL — ABNORMAL LOW (ref 12.0–15.0)
Immature Granulocytes: 0 %
Lymphocytes Relative: 2 %
Lymphs Abs: 0.3 10*3/uL — ABNORMAL LOW (ref 0.7–4.0)
MCH: 25.2 pg — ABNORMAL LOW (ref 26.0–34.0)
MCHC: 27.7 g/dL — ABNORMAL LOW (ref 30.0–36.0)
MCV: 91 fL (ref 80.0–100.0)
Monocytes Absolute: 2.3 10*3/uL — ABNORMAL HIGH (ref 0.1–1.0)
Monocytes Relative: 14 %
Neutro Abs: 13.9 10*3/uL — ABNORMAL HIGH (ref 1.7–7.7)
Neutrophils Relative %: 84 %
Platelets: 293 10*3/uL (ref 150–400)
RBC: 4.56 MIL/uL (ref 3.87–5.11)
RDW: 16.8 % — ABNORMAL HIGH (ref 11.5–15.5)
WBC: 16.6 10*3/uL — ABNORMAL HIGH (ref 4.0–10.5)
nRBC: 0 % (ref 0.0–0.2)

## 2023-05-12 LAB — PROTEIN, PLEURAL OR PERITONEAL FLUID: Total protein, fluid: <3 g/dL

## 2023-05-12 LAB — COMPREHENSIVE METABOLIC PANEL
ALT: 65 U/L — ABNORMAL HIGH (ref 0–44)
AST: 44 U/L — ABNORMAL HIGH (ref 15–41)
Albumin: 2.9 g/dL — ABNORMAL LOW (ref 3.5–5.0)
Alkaline Phosphatase: 88 U/L (ref 38–126)
Anion gap: 12 (ref 5–15)
BUN: 34 mg/dL — ABNORMAL HIGH (ref 8–23)
CO2: 21 mmol/L — ABNORMAL LOW (ref 22–32)
Calcium: 7.7 mg/dL — ABNORMAL LOW (ref 8.9–10.3)
Chloride: 104 mmol/L (ref 98–111)
Creatinine, Ser: 0.74 mg/dL (ref 0.44–1.00)
GFR, Estimated: >60 mL/min (ref >60)
Glucose, Bld: 199 mg/dL — ABNORMAL HIGH (ref 70–99)
Potassium: 4 mmol/L (ref 3.5–5.1)
Sodium: 137 mmol/L (ref 135–145)
Total Bilirubin: 0.6 mg/dL (ref 0.3–1.2)
Total Protein: 6.5 g/dL (ref 6.5–8.1)

## 2023-05-12 LAB — BLOOD GAS, ARTERIAL
Bicarbonate: 28.7 mmol/L — ABNORMAL HIGH (ref 20.0–28.0)
MECHVT: 430 mL
O2 Saturation: 100 %
Patient temperature: 37.5
RATE: 16 resp/min
pCO2 arterial: 58 mmHg — ABNORMAL HIGH (ref 32–48)
pH, Arterial: 7.3 — ABNORMAL LOW (ref 7.35–7.45)
pO2, Arterial: 107 mmHg (ref 83–108)

## 2023-05-12 LAB — URINE CULTURE: Culture: 30000 — AB

## 2023-05-12 LAB — GLUCOSE, CAPILLARY
Glucose-Capillary: 182 mg/dL — ABNORMAL HIGH (ref 70–99)
Glucose-Capillary: 203 mg/dL — ABNORMAL HIGH (ref 70–99)
Glucose-Capillary: 171 mg/dL — ABNORMAL HIGH (ref 70–99)
Glucose-Capillary: 138 mg/dL — ABNORMAL HIGH (ref 70–99)
Glucose-Capillary: 159 mg/dL — ABNORMAL HIGH (ref 70–99)

## 2023-05-12 LAB — MAGNESIUM: Magnesium: 2.4 mg/dL (ref 1.7–2.4)

## 2023-05-12 LAB — BODY FLUID CELL COUNT WITH DIFFERENTIAL
Eos, Fluid: 0 %
Lymphs, Fluid: 5 %
Monocyte-Macrophage-Serous Fluid: 22 % — ABNORMAL LOW (ref 50–90)
Neutrophil Count, Fluid: 73 % — ABNORMAL HIGH (ref 0–25)
Total Nucleated Cell Count, Fluid: 1794 cu mm — ABNORMAL HIGH (ref 0–1000)

## 2023-05-12 LAB — PHOSPHORUS: Phosphorus: 4 mg/dL (ref 2.5–4.6)

## 2023-05-12 LAB — LACTATE DEHYDROGENASE, PLEURAL OR PERITONEAL FLUID: LD, Fluid: 151 U/L — ABNORMAL HIGH (ref 3–23)

## 2023-05-12 LAB — GLUCOSE, PLEURAL OR PERITONEAL FLUID: Glucose, Fluid: 142 mg/dL

## 2023-05-12 MED ORDER — GLYCOPYRROLATE 1 MG PO TABS
1.0000 mg | ORAL_TABLET | Freq: Three times a day (TID) | ORAL | Status: DC
  Administered 2023-05-12: 1 mg
  Filled 2023-05-12: qty 1

## 2023-05-12 MED ORDER — ARTIFICIAL TEARS OPHTHALMIC OINT
TOPICAL_OINTMENT | OPHTHALMIC | Status: DC | PRN
  Filled 2023-05-12: qty 3.5

## 2023-05-12 MED ORDER — FENTANYL CITRATE PF 50 MCG/ML IJ SOSY: 25.0000 ug | PREFILLED_SYRINGE | INTRAMUSCULAR | Status: DC | PRN

## 2023-05-12 MED ORDER — ORAL CARE MOUTH RINSE
15.0000 mL | OROMUCOSAL | Status: DC
  Administered 2023-05-12: 15 mL via OROMUCOSAL

## 2023-05-12 MED ORDER — ORAL CARE MOUTH RINSE: 15.0000 mL | OROMUCOSAL | Status: DC | PRN

## 2023-05-13 LAB — BLOOD GAS, ARTERIAL
Acid-Base Excess: 1.2 mmol/L (ref 0.0–2.0)
FIO2: 80 %
PEEP: 5 cmH2O

## 2023-05-15 ENCOUNTER — Other Ambulatory Visit: Payer: Medicare Other

## 2023-05-15 ENCOUNTER — Ambulatory Visit: Payer: Medicare Other | Admitting: Hematology

## 2023-05-15 LAB — CULTURE, BLOOD (ROUTINE X 2)
Culture: NO GROWTH
Culture: NO GROWTH
Special Requests: ADEQUATE

## 2023-05-15 LAB — BODY FLUID CULTURE W GRAM STAIN
Culture: NO GROWTH
Gram Stain: NONE SEEN

## 2023-05-15 LAB — CYTOLOGY - NON PAP

## 2023-05-19 LAB — MISC LABCORP TEST (SEND OUT): Labcorp test code: 9985

## 2023-05-23 LAB — MISC LABCORP TEST (SEND OUT)

## 2023-06-01 NOTE — Accreditation Note (Signed)
Restraint death within 24 hours of soft bilateral wrist restraints logged on 05/17/2023 at 1716 by Kristiane Morsch IacoucciRN

## 2023-06-01 NOTE — Procedures (Signed)
Thoracentesis  Procedure Note  Shannon [name]  [id number]  1941-07-25  Date:05/06/2023  Time:10:34 AM   Provider Performing:Bena Kobel V. Lua Feng   Procedure: Thoracentesis with imaging guidance (56213)  Indication(s) Pleural Effusion  Consent Risks of the procedure as well as the alternatives and risks of each were explained to the patient and/or caregiver.  Consent for the procedure was obtained and is signed in the bedside chart  Anesthesia Topical only with 1% lidocaine    Time Out Verified patient identification, verified procedure, site/side was marked, verified correct patient position, special equipment/implants available, medications/allergies/relevant history reviewed, required imaging and test results available.   Sterile Technique Maximal sterile technique including full sterile barrier drape, hand hygiene, sterile gown, sterile gloves, mask, hair covering, sterile ultrasound probe cover (if used).  Procedure Description Ultrasound was used to identify appropriate pleural anatomy for placement and overlying skin marked.  Area of drainage cleaned and draped in sterile fashion. Lidocaine was used to anesthetize the skin and subcutaneous tissue.  500 cc's of bloody appearing fluid was drained from the right pleural space. Catheter then removed and bandaid applied to site.   Complications/Tolerance None; patient tolerated the procedure well. Chest X-ray is ordered to confirm no post-procedural complication.   EBL Minimal   Specimen(s) Pleural fluid for cell count, LDH, protein, culture and cytology  [name] V. Vassie Loll MD

## 2023-06-01 NOTE — Death Summary Note (Signed)
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  Last seen normal around 10:30 on 7/9 found a.m. 7/10 around 8:30.  EMS assisted ventilations and route to ED.  On ED arrival patient was intubated with etomidate and roc.  Vital signs on arrival significant for significant hypothermia with rectal temperature 85.7 and hypotension.  Lab work significant for K6.5, glucose 195, BUN 46, calcium 8.3, albumin 3.2, AST 74, ALT 65, WBC 19.5, lactic within normal at 1.8, CK also normal at 75.  UA with rare bacteria negative nitrates and large leukocytes.  Chest x-ray with likely aspiration and underlying pulmonary edema.  Head CT negative for acute intracranial abnormalities and osseous metastatic disease including left mandible.  CT spine with no acute abnormalities, diffuse osseous metastatic disease seen.  Given need for advanced airway PCCM consulted for further management and admission.   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 Head of bed elevated 30 degrees. Plateau pressures less than 30 cm H20.  Follow intermittent chest x-ray and ABG.   SAT/SBT as tolerated, mentation preclude extubation  Ensure adequate pulmonary hygiene  VAP bundle in place  Continue empiric Zosyn   Septic shock -hypothermia and leukocytosis POA,due to aspiration pneumonia or UTI -Urine culture shows 30 K Enterococcus P: Continue empiric Zosyn Levophed to goal MAP 65   Right pleural effusion?  Parapneumonic -Proceed with thoracentesis, risks and benefits discussed   Acute intermittent agitation -PAD protocol with Precedex and fentanyl, goal RASS 0   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  Hypothyroidism s/p total thyroidectomy January 2011 with bilateral neck dissection P: Outpatient follow-up Supportive care   Paroxysmal atrial fibrillation/ RVR -Per PCP note in care everywhere patient experienced intermittent episodes of atrial fibrillation during ICU stay for pneumonia May of this year  P: Telemetry Amiodarone drip Resume home beta-blocker when appropriate   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  During episodes of agitation/wakefulness patient indicates she would not like to remain intubated. Jun 11, 2023 Goals of care Multiple discussions with son Casimiro Needle. Based on her agitation and previous known wishes, he agrees to palliative  extubation with comfort care if she fails.  He request  thoracentesis to be performed prior to give her the best chance of surviving. She was extubated, was able to interact with family but developed respiratory distress and was transitioned to comfort care and passed away soon after   Procedures: ETT, right thoracentesis    Signed: Comer Locket. Vassie Loll, MD 05/13/23

## 2023-06-01 NOTE — Inpatient Diabetes Management (Signed)
Inpatient Diabetes Program Recommendations  AACE/ADA: New Consensus Statement on Inpatient Glycemic Control   Target Ranges:  Prepandial:   less than 140 mg/dL      Peak postprandial:   less than 180 mg/dL (1-2 hours)      Critically ill patients:  140 - 180 mg/dL    Latest Reference Range & Units 05/27/2023 00:16 05/25/2023 03:41  Glucose-Capillary 70 - 99 mg/dL 161 (H) 096 (H)    Latest Reference Range & Units 05/06/2023 09:45 05/17/2023 11:15 05/29/2023 16:17 05/11/23 03:23 05/03/2023 03:20  Glucose 70 - 99 mg/dL 045 (H) 409 (H) 811 (H) 158 (H) 199 (H)   Review of Glycemic Control  Diabetes history: No Outpatient Diabetes medications: NA Current orders for Inpatient glycemic control: None; Osmolite @ 50 ml/hr  Inpatient Diabetes Program Recommendations:    Insulin: May want to consider ordering CBGs Q4H and Novolog 0-6 units Q4H if appropriate for patient's plan of care.  Thanks, Orlando Penner, RN, MSN, CDCES Diabetes Coordinator Inpatient Diabetes Program [phone] (Team Pager from 8am to 5pm)

## 2023-06-01 NOTE — Plan of Care (Signed)

## 2023-06-01 NOTE — Progress Notes (Signed)
Chaplain visited patient at nurse's request. Patient was not alert, but her family was at bedside. They were waiting for sedation to wear off so patient could be extubated. I provided reflective listening, a calm presence at prayer at family's request.  Chaplain Fuller Canada, Judie Petit Div   05/09/2023 1350  Spiritual Encounters  Type of Visit Initial  Care provided to: Pt and family  Referral source Nurse (RN/NT/LPN)  Reason for visit Routine spiritual support  OnCall Visit No  Spiritual Framework  Presenting Themes Impactful experiences and emotions  Community/Connection Family  Patient Stress Factors Health changes  Interventions  Spiritual Care Interventions Made Established relationship of care and support;Compassionate presence;Reflective listening;Prayer  Intervention Outcomes  Outcomes Connection to spiritual care;Awareness of support  Spiritual Care Plan  Spiritual Care Issues Still Outstanding Referring to oncoming chaplain for further support

## 2023-06-01 NOTE — Progress Notes (Signed)
NAME:  Shannon [name], MRN:  [id number], DOB:  06-20-1941, LOS: 2 ADMISSION DATE:  05/13/2023, CONSULTATION DATE:  05/14/2023 REFERRING MD:  Dr. 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  Last seen normal around 10:30 on 7/9 found a.m. 7/10 around 8:30.  EMS assisted ventilations and route to ED.  On ED arrival patient was intubated with etomidate and roc.  Vital signs on arrival significant for significant hypothermia with rectal temperature 85.7 and hypotension.  Lab work significant for K6.5, glucose 195, BUN 46, calcium 8.3, albumin 3.2, AST 74, ALT 65, WBC 19.5, lactic within normal at 1.8, CK also normal at 75.  UA with rare bacteria negative nitrates and large leukocytes.  Chest x-ray with likely aspiration and underlying pulmonary edema.  Head CT negative for acute intracranial abnormalities and osseous metastatic disease including left mandible.  CT spine with no acute abnormalities, diffuse osseous metastatic disease seen.  Given need for advanced airway PCCM consulted for further management and admission.  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  During episodes of agitation/wakefulness patient indicates she would not like to remain intubated.  Interim History / Subjective:   Low-grade febrile Tachycardic, agitated this morning  on Precedex and fentanyl drips. Remains critically ill, intubated, on low-dose Levophed Good urine output last 24 hours  Objective   Blood pressure (!) 100/59, pulse (!) 209, temperature 100 F (37.8 C), resp. rate 18, height 5\' 4"  (1.626 m), weight 56.3 kg, SpO2 98%.    Vent Mode: PRVC FiO2 (%):  [80 %-90 %] 80 % Set Rate:  [16 bmp] 16 bmp Vt Set:  [430 mL] 430 mL PEEP:  [5 cmH20] 5 cmH20 Plateau Pressure:  [15 cmH20-19 cmH20] 18 cmH20   Intake/Output Summary (Last 24 hours) at 05/07/2023 4098 Last data filed at 05/28/2023 1191 Gross per 24 hour  Intake 2746.36 ml  Output 1460 ml  Net 1286.36 ml   Filed Weights   05/21/2023 1530 05/11/23 0500 05/18/2023 0457  Weight: 57.2 kg 57 kg 56.3 kg    Examination: General: Acute on chronic deconditioned frail elderly female lying in bed on mechanical ventilation in no acute distress HEENT: ETT, MM pink/moist, PERRL,  Neuro: Sedated, RASS -2, does not follow commands CV: s1s2 regular rate and rhythm, no murmur, rubs, or gallops,  PULM: Decreased breath sounds on right, no accessory muscle use GI: soft, bowel sounds active in all 4 quadrants, non-tender, non-distended Extremities: warm/dry, no edema  Skin: no rashes or lesions  Chest x-ray dependently reviewed shows bilateral infiltrates and possible right effusion. Labs show normal electrolytes, normal renal function, mild leukocytosis,   Resolved Hospital Problem list   Hyperkalemia  Assessment & Plan:  Acute Hypoxic and Hypercapnic Respiratory Failure  Aspiration pneumonia -In the setting of aspiration pneumonia and likely pulmonary edema -CTA chest negative for acute PE P: Continue lung protective ventilation Wean PEEP and FiO2  for sats greater than 90%. Head of bed elevated 30 degrees. Plateau pressures less than 30 cm H20.  Follow intermittent chest x-ray and ABG.   SAT/SBT as tolerated, mentation preclude extubation  Ensure adequate pulmonary hygiene  VAP bundle in  place  Continue empiric Zosyn  Septic shock -hypothermia and leukocytosis POA,due to aspiration pneumonia or UTI -Urine culture shows 30 K Enterococcus P: Continue empiric Zosyn Levophed to goal MAP 65  Right pleural effusion?  Parapneumonic -Proceed with thoracentesis, risks and benefits discussed  Acute intermittent agitation -PAD protocol with Precedex and fentanyl, goal RASS 0  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  Hypothyroidism s/p total thyroidectomy January 2011 with bilateral neck dissection P: Outpatient follow-up Supportive care  Paroxysmal atrial fibrillation/ RVR -Per PCP note in care everywhere patient experienced intermittent episodes of atrial fibrillation during ICU stay for pneumonia May of this year  P: Telemetry Amiodarone drip Resume home beta-blocker when appropriate  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  He request thoracentesis to be performed prior to give her the best chance of surviving.   Best Practice (right click and "Reselect all SmartList Selections" daily)   Diet/type: tubefeeds DVT prophylaxis: LMWH GI prophylaxis: PPI Lines: N/A Foley:  Yes, and it is still needed Code Status:  full code Last date of multidisciplinary goals of care discussion discussed with son Casimiro Needle who used to work for [employer].  Out of facility DNR reassured.  He requested intubation but no CPR no cardioversion,   Critical care time:   CRITICAL CARE Performed by: Comer Locket Niki Payment   Total critical care time: 45 minutes  Critical care time was exclusive of separately billable procedures and treating  other patients.  Critical care was necessary to treat or prevent imminent or life-threatening deterioration.  Critical care was time spent personally by me on the following activities: development of treatment plan with patient and/or surrogate as well as nursing, discussions with consultants, evaluation of patient's response to treatment, examination of patient, obtaining history from patient or surrogate, ordering and performing treatments and interventions, ordering and review of laboratory studies, ordering and review of radiographic studies, pulse oximetry and re-evaluation of patient's condition.  Cyril Mourning MD. Tonny Bollman. [facility] Pager : [phone]  If no response to pager , please call [phone] until 7 pm After 7:00 pm call Elink  [phone]    05/19/2023, 8:23 AM

## 2023-06-01 NOTE — Progress Notes (Signed)
Patient placement called because postmortem flowsheet completed. Patient being taken to morgue by transport.

## 2023-06-01 NOTE — Procedures (Signed)
Extubation Procedure Note  Patient Details:   Name: Shannon [name] DOB: [birth year] MRN: [id number]   Airway Documentation:    Vent end date: 05/11/2023 Vent end time: 1501   Evaluation  O2 sats: currently acceptable Complications: Complications of hypoxia and agitation.  Patient did not tolerate procedure well. Bilateral Breath Sounds: Diminished, Rhonchi   No  Pt was extubated to NRB mask per CCMD order. Pt was very agitated before and after extubation. Pt was suctioned and had a positive cuff leak prior to extubation. Pt had a good cough afterwards but could not clear secretions on her own. CCM-PA order to NTS pt. RT obtained a copious amount of tan/pink tinged secretions. Pt O2 saturations fell as low as 52%, with NRB at 15L and NTS pt saturations now up to 90%, pt was not able to speak afterwards. CCM-PA, RN, and family were all at bedside and aware. No stridor is heard at this time.   Shannon [name] 05/08/2023, 3:12 PM

## 2023-06-01 NOTE — Plan of Care (Signed)
Pt not suitable for transfer or discharge at this time.

## 2023-06-01 DEATH — deceased

## 2023-07-20 ENCOUNTER — Ambulatory Visit: Payer: Medicare Other | Admitting: Internal Medicine
